# Patient Record
Sex: Female | Born: 1940
Health system: Southern US, Community
[De-identification: ages and names within clinical notes are randomized; demographics above are authoritative.]

## PROBLEM LIST (undated history)

## (undated) DIAGNOSIS — N289 Disorder of kidney and ureter, unspecified: Secondary | ICD-10-CM

## (undated) DIAGNOSIS — J45909 Unspecified asthma, uncomplicated: Secondary | ICD-10-CM

## (undated) DIAGNOSIS — I2510 Atherosclerotic heart disease of native coronary artery without angina pectoris: Secondary | ICD-10-CM

## (undated) DIAGNOSIS — E119 Type 2 diabetes mellitus without complications: Secondary | ICD-10-CM

## (undated) DIAGNOSIS — E782 Mixed hyperlipidemia: Secondary | ICD-10-CM

## (undated) DIAGNOSIS — G4700 Insomnia, unspecified: Secondary | ICD-10-CM

## (undated) DIAGNOSIS — R413 Other amnesia: Secondary | ICD-10-CM

## (undated) DIAGNOSIS — I4430 Unspecified atrioventricular block: Secondary | ICD-10-CM

## (undated) DIAGNOSIS — T8859XA Other complications of anesthesia, initial encounter: Secondary | ICD-10-CM

## (undated) DIAGNOSIS — Z800 Family history of malignant neoplasm of digestive organs: Secondary | ICD-10-CM

## (undated) DIAGNOSIS — T466X5A Adverse effect of antihyperlipidemic and antiarteriosclerotic drugs, initial encounter: Secondary | ICD-10-CM

## (undated) DIAGNOSIS — M109 Gout, unspecified: Secondary | ICD-10-CM

## (undated) DIAGNOSIS — K219 Gastro-esophageal reflux disease without esophagitis: Secondary | ICD-10-CM

## (undated) DIAGNOSIS — D641 Secondary sideroblastic anemia due to disease: Secondary | ICD-10-CM

## (undated) DIAGNOSIS — R011 Cardiac murmur, unspecified: Secondary | ICD-10-CM

## (undated) DIAGNOSIS — E559 Vitamin D deficiency, unspecified: Secondary | ICD-10-CM

## (undated) DIAGNOSIS — Z860100 Personal history of colon polyps, unspecified: Secondary | ICD-10-CM

## (undated) DIAGNOSIS — G720 Drug-induced myopathy: Secondary | ICD-10-CM

## (undated) DIAGNOSIS — F329 Major depressive disorder, single episode, unspecified: Secondary | ICD-10-CM

## (undated) DIAGNOSIS — E079 Disorder of thyroid, unspecified: Secondary | ICD-10-CM

## (undated) DIAGNOSIS — I10 Essential (primary) hypertension: Secondary | ICD-10-CM

## (undated) DIAGNOSIS — N200 Calculus of kidney: Secondary | ICD-10-CM

## (undated) DIAGNOSIS — F32A Depression, unspecified: Secondary | ICD-10-CM

## (undated) DIAGNOSIS — D689 Coagulation defect, unspecified: Secondary | ICD-10-CM

## (undated) DIAGNOSIS — I509 Heart failure, unspecified: Secondary | ICD-10-CM

## (undated) DIAGNOSIS — F419 Anxiety disorder, unspecified: Secondary | ICD-10-CM

## (undated) DIAGNOSIS — G43909 Migraine, unspecified, not intractable, without status migrainosus: Secondary | ICD-10-CM

## (undated) DIAGNOSIS — J449 Chronic obstructive pulmonary disease, unspecified: Secondary | ICD-10-CM

## (undated) DIAGNOSIS — F330 Major depressive disorder, recurrent, mild: Secondary | ICD-10-CM

## (undated) DIAGNOSIS — D649 Anemia, unspecified: Secondary | ICD-10-CM

## (undated) DIAGNOSIS — M797 Fibromyalgia: Secondary | ICD-10-CM

## (undated) DIAGNOSIS — N189 Chronic kidney disease, unspecified: Secondary | ICD-10-CM

## (undated) DIAGNOSIS — I519 Heart disease, unspecified: Secondary | ICD-10-CM

## (undated) DIAGNOSIS — M1990 Unspecified osteoarthritis, unspecified site: Secondary | ICD-10-CM

## (undated) DIAGNOSIS — G4730 Sleep apnea, unspecified: Secondary | ICD-10-CM

## (undated) DIAGNOSIS — L719 Rosacea, unspecified: Secondary | ICD-10-CM

## (undated) HISTORY — DX: Insomnia, unspecified: G47.00

## (undated) HISTORY — DX: Major depressive disorder, recurrent, mild: F33.0

## (undated) HISTORY — DX: Gastro-esophageal reflux disease without esophagitis: K21.9

## (undated) HISTORY — DX: Gout, unspecified: M10.9

## (undated) HISTORY — DX: Sleep apnea, unspecified: G47.30

## (undated) HISTORY — DX: Migraine, unspecified, not intractable, without status migrainosus: G43.909

## (undated) HISTORY — DX: Secondary sideroblastic anemia due to disease: D64.1

## (undated) HISTORY — DX: Anxiety disorder, unspecified: F41.9

## (undated) HISTORY — DX: Heart disease, unspecified: I51.9

## (undated) HISTORY — DX: Fibromyalgia: M79.7

## (undated) HISTORY — DX: Personal history of colonic polyps: Z86.010

## (undated) HISTORY — DX: Atherosclerotic heart disease of native coronary artery without angina pectoris: I25.10

## (undated) HISTORY — DX: Vitamin D deficiency, unspecified: E55.9

## (undated) HISTORY — DX: Major depressive disorder, single episode, unspecified: F32.9

## (undated) HISTORY — DX: Anemia, unspecified: D64.9

## (undated) HISTORY — DX: Calculus of kidney: N20.0

## (undated) HISTORY — DX: Personal history of colon polyps, unspecified: Z86.0100

## (undated) HISTORY — DX: Drug-induced myopathy: G72.0

## (undated) HISTORY — DX: Mixed hyperlipidemia: E78.2

## (undated) HISTORY — DX: Disorder of kidney and ureter, unspecified: N28.9

## (undated) HISTORY — DX: Other amnesia: R41.3

## (undated) HISTORY — DX: Chronic obstructive pulmonary disease, unspecified: J44.9

## (undated) HISTORY — DX: Unspecified osteoarthritis, unspecified site: M19.90

## (undated) HISTORY — DX: Unspecified asthma, uncomplicated: J45.909

## (undated) HISTORY — DX: Family history of malignant neoplasm of digestive organs: Z80.0

## (undated) HISTORY — DX: Heart failure, unspecified: I50.9

## (undated) HISTORY — DX: Depression, unspecified: F32.A

## (undated) HISTORY — DX: Chronic kidney disease, unspecified: N18.9

## (undated) HISTORY — DX: Rosacea, unspecified: L71.9

## (undated) HISTORY — DX: Coagulation defect, unspecified: D68.9

## (undated) HISTORY — DX: Cardiac murmur, unspecified: R01.1

## (undated) HISTORY — DX: Unspecified atrioventricular block: I44.30

## (undated) HISTORY — DX: Disorder of thyroid, unspecified: E07.9

## (undated) HISTORY — DX: Essential (primary) hypertension: I10

## (undated) HISTORY — DX: Drug-induced myopathy: T46.6X5A

## (undated) HISTORY — DX: Type 2 diabetes mellitus without complications: E11.9

---

## 1983-01-12 DIAGNOSIS — N200 Calculus of kidney: Secondary | ICD-10-CM

## 1983-01-12 HISTORY — DX: Calculus of kidney: N20.0

## 1988-12-31 HISTORY — PX: APPENDECTOMY: SHX54

## 1996-05-22 HISTORY — PX: BREAST LUMPECTOMY: SHX2

## 1998-02-18 ENCOUNTER — Other Ambulatory Visit: Admission: RE | Admit: 1998-02-18 | Discharge: 1998-02-18 | Payer: Self-pay | Admitting: Gynecology

## 1998-02-22 ENCOUNTER — Inpatient Hospital Stay (HOSPITAL_COMMUNITY): Admission: RE | Admit: 1998-02-22 | Discharge: 1998-02-25 | Payer: Self-pay | Admitting: Gynecology

## 1998-03-05 HISTORY — PX: ABDOMINAL HYSTERECTOMY: SHX81

## 1999-05-09 ENCOUNTER — Other Ambulatory Visit: Admission: RE | Admit: 1999-05-09 | Discharge: 1999-05-09 | Payer: Self-pay | Admitting: Gynecology

## 2000-07-24 ENCOUNTER — Other Ambulatory Visit: Admission: RE | Admit: 2000-07-24 | Discharge: 2000-07-24 | Payer: Self-pay | Admitting: Gynecology

## 2001-09-03 ENCOUNTER — Other Ambulatory Visit: Admission: RE | Admit: 2001-09-03 | Discharge: 2001-09-03 | Payer: Self-pay | Admitting: Gynecology

## 2002-05-07 ENCOUNTER — Ambulatory Visit (HOSPITAL_COMMUNITY): Admission: RE | Admit: 2002-05-07 | Discharge: 2002-05-07 | Payer: Self-pay | Admitting: Gastroenterology

## 2002-10-28 ENCOUNTER — Other Ambulatory Visit: Admission: RE | Admit: 2002-10-28 | Discharge: 2002-10-28 | Payer: Self-pay | Admitting: Gynecology

## 2004-02-23 ENCOUNTER — Other Ambulatory Visit: Admission: RE | Admit: 2004-02-23 | Discharge: 2004-02-23 | Payer: Self-pay | Admitting: Gynecology

## 2005-04-07 ENCOUNTER — Other Ambulatory Visit: Admission: RE | Admit: 2005-04-07 | Discharge: 2005-04-07 | Payer: Self-pay | Admitting: Gynecology

## 2005-11-16 HISTORY — PX: ESOPHAGOGASTRODUODENOSCOPY: SHX1529

## 2011-09-12 DIAGNOSIS — D631 Anemia in chronic kidney disease: Secondary | ICD-10-CM | POA: Diagnosis not present

## 2011-09-12 DIAGNOSIS — N039 Chronic nephritic syndrome with unspecified morphologic changes: Secondary | ICD-10-CM | POA: Diagnosis not present

## 2011-09-12 DIAGNOSIS — N289 Disorder of kidney and ureter, unspecified: Secondary | ICD-10-CM | POA: Diagnosis not present

## 2011-09-26 DIAGNOSIS — N189 Chronic kidney disease, unspecified: Secondary | ICD-10-CM | POA: Diagnosis not present

## 2011-09-26 DIAGNOSIS — D631 Anemia in chronic kidney disease: Secondary | ICD-10-CM | POA: Diagnosis not present

## 2011-09-28 DIAGNOSIS — Z79899 Other long term (current) drug therapy: Secondary | ICD-10-CM | POA: Diagnosis not present

## 2011-10-10 DIAGNOSIS — N189 Chronic kidney disease, unspecified: Secondary | ICD-10-CM | POA: Diagnosis not present

## 2011-10-10 DIAGNOSIS — D631 Anemia in chronic kidney disease: Secondary | ICD-10-CM | POA: Diagnosis not present

## 2011-11-14 DIAGNOSIS — E7800 Pure hypercholesterolemia, unspecified: Secondary | ICD-10-CM | POA: Diagnosis not present

## 2011-11-14 DIAGNOSIS — Z79899 Other long term (current) drug therapy: Secondary | ICD-10-CM | POA: Diagnosis not present

## 2011-11-14 DIAGNOSIS — E119 Type 2 diabetes mellitus without complications: Secondary | ICD-10-CM | POA: Diagnosis not present

## 2011-11-15 DIAGNOSIS — E7800 Pure hypercholesterolemia, unspecified: Secondary | ICD-10-CM | POA: Diagnosis not present

## 2011-11-15 DIAGNOSIS — E119 Type 2 diabetes mellitus without complications: Secondary | ICD-10-CM | POA: Diagnosis not present

## 2011-11-15 DIAGNOSIS — D649 Anemia, unspecified: Secondary | ICD-10-CM | POA: Diagnosis not present

## 2011-11-15 DIAGNOSIS — I10 Essential (primary) hypertension: Secondary | ICD-10-CM | POA: Diagnosis not present

## 2011-11-15 DIAGNOSIS — F329 Major depressive disorder, single episode, unspecified: Secondary | ICD-10-CM | POA: Diagnosis not present

## 2011-12-04 DIAGNOSIS — E041 Nontoxic single thyroid nodule: Secondary | ICD-10-CM | POA: Diagnosis not present

## 2011-12-04 DIAGNOSIS — E119 Type 2 diabetes mellitus without complications: Secondary | ICD-10-CM | POA: Diagnosis not present

## 2011-12-04 DIAGNOSIS — I10 Essential (primary) hypertension: Secondary | ICD-10-CM | POA: Diagnosis not present

## 2011-12-04 DIAGNOSIS — E7800 Pure hypercholesterolemia, unspecified: Secondary | ICD-10-CM | POA: Diagnosis not present

## 2011-12-26 DIAGNOSIS — D631 Anemia in chronic kidney disease: Secondary | ICD-10-CM | POA: Diagnosis not present

## 2011-12-26 DIAGNOSIS — N189 Chronic kidney disease, unspecified: Secondary | ICD-10-CM | POA: Diagnosis not present

## 2011-12-26 DIAGNOSIS — N039 Chronic nephritic syndrome with unspecified morphologic changes: Secondary | ICD-10-CM | POA: Diagnosis not present

## 2012-02-06 DIAGNOSIS — D631 Anemia in chronic kidney disease: Secondary | ICD-10-CM | POA: Diagnosis not present

## 2012-02-06 DIAGNOSIS — N189 Chronic kidney disease, unspecified: Secondary | ICD-10-CM | POA: Diagnosis not present

## 2012-02-12 DIAGNOSIS — E7800 Pure hypercholesterolemia, unspecified: Secondary | ICD-10-CM | POA: Diagnosis not present

## 2012-02-12 DIAGNOSIS — I10 Essential (primary) hypertension: Secondary | ICD-10-CM | POA: Diagnosis not present

## 2012-02-12 DIAGNOSIS — E119 Type 2 diabetes mellitus without complications: Secondary | ICD-10-CM | POA: Diagnosis not present

## 2012-02-12 DIAGNOSIS — Z79899 Other long term (current) drug therapy: Secondary | ICD-10-CM | POA: Diagnosis not present

## 2012-02-12 DIAGNOSIS — D649 Anemia, unspecified: Secondary | ICD-10-CM | POA: Diagnosis not present

## 2012-02-12 DIAGNOSIS — N189 Chronic kidney disease, unspecified: Secondary | ICD-10-CM | POA: Diagnosis not present

## 2012-02-20 DIAGNOSIS — N189 Chronic kidney disease, unspecified: Secondary | ICD-10-CM | POA: Diagnosis not present

## 2012-02-20 DIAGNOSIS — D631 Anemia in chronic kidney disease: Secondary | ICD-10-CM | POA: Diagnosis not present

## 2012-03-05 DIAGNOSIS — N189 Chronic kidney disease, unspecified: Secondary | ICD-10-CM | POA: Diagnosis not present

## 2012-03-05 DIAGNOSIS — D631 Anemia in chronic kidney disease: Secondary | ICD-10-CM | POA: Diagnosis not present

## 2012-03-19 DIAGNOSIS — N039 Chronic nephritic syndrome with unspecified morphologic changes: Secondary | ICD-10-CM | POA: Diagnosis not present

## 2012-03-19 DIAGNOSIS — N189 Chronic kidney disease, unspecified: Secondary | ICD-10-CM | POA: Diagnosis not present

## 2012-03-19 DIAGNOSIS — D631 Anemia in chronic kidney disease: Secondary | ICD-10-CM | POA: Diagnosis not present

## 2012-04-08 DIAGNOSIS — R233 Spontaneous ecchymoses: Secondary | ICD-10-CM | POA: Diagnosis not present

## 2012-04-08 DIAGNOSIS — Z79899 Other long term (current) drug therapy: Secondary | ICD-10-CM | POA: Diagnosis not present

## 2012-04-08 DIAGNOSIS — G459 Transient cerebral ischemic attack, unspecified: Secondary | ICD-10-CM | POA: Diagnosis not present

## 2012-04-08 DIAGNOSIS — R29810 Facial weakness: Secondary | ICD-10-CM | POA: Diagnosis not present

## 2012-04-08 DIAGNOSIS — I998 Other disorder of circulatory system: Secondary | ICD-10-CM | POA: Diagnosis not present

## 2012-04-08 DIAGNOSIS — G8190 Hemiplegia, unspecified affecting unspecified side: Secondary | ICD-10-CM | POA: Diagnosis not present

## 2012-04-16 DIAGNOSIS — G4710 Hypersomnia, unspecified: Secondary | ICD-10-CM | POA: Diagnosis not present

## 2012-04-22 DIAGNOSIS — I6529 Occlusion and stenosis of unspecified carotid artery: Secondary | ICD-10-CM | POA: Diagnosis not present

## 2012-04-30 DIAGNOSIS — N189 Chronic kidney disease, unspecified: Secondary | ICD-10-CM | POA: Diagnosis not present

## 2012-04-30 DIAGNOSIS — D631 Anemia in chronic kidney disease: Secondary | ICD-10-CM | POA: Diagnosis not present

## 2012-05-28 DIAGNOSIS — E7800 Pure hypercholesterolemia, unspecified: Secondary | ICD-10-CM | POA: Diagnosis not present

## 2012-05-28 DIAGNOSIS — Z79899 Other long term (current) drug therapy: Secondary | ICD-10-CM | POA: Diagnosis not present

## 2012-05-28 DIAGNOSIS — E559 Vitamin D deficiency, unspecified: Secondary | ICD-10-CM | POA: Diagnosis not present

## 2012-05-28 DIAGNOSIS — E038 Other specified hypothyroidism: Secondary | ICD-10-CM | POA: Diagnosis not present

## 2012-05-28 DIAGNOSIS — E119 Type 2 diabetes mellitus without complications: Secondary | ICD-10-CM | POA: Diagnosis not present

## 2012-06-04 DIAGNOSIS — I10 Essential (primary) hypertension: Secondary | ICD-10-CM | POA: Diagnosis not present

## 2012-06-04 DIAGNOSIS — E041 Nontoxic single thyroid nodule: Secondary | ICD-10-CM | POA: Diagnosis not present

## 2012-06-04 DIAGNOSIS — E7800 Pure hypercholesterolemia, unspecified: Secondary | ICD-10-CM | POA: Diagnosis not present

## 2012-06-04 DIAGNOSIS — Z1231 Encounter for screening mammogram for malignant neoplasm of breast: Secondary | ICD-10-CM | POA: Diagnosis not present

## 2012-06-04 DIAGNOSIS — E119 Type 2 diabetes mellitus without complications: Secondary | ICD-10-CM | POA: Diagnosis not present

## 2012-06-11 DIAGNOSIS — N189 Chronic kidney disease, unspecified: Secondary | ICD-10-CM | POA: Diagnosis not present

## 2012-06-11 DIAGNOSIS — D631 Anemia in chronic kidney disease: Secondary | ICD-10-CM | POA: Diagnosis not present

## 2012-06-13 DIAGNOSIS — Z23 Encounter for immunization: Secondary | ICD-10-CM | POA: Diagnosis not present

## 2012-06-27 DIAGNOSIS — I10 Essential (primary) hypertension: Secondary | ICD-10-CM | POA: Diagnosis not present

## 2012-06-27 DIAGNOSIS — G44209 Tension-type headache, unspecified, not intractable: Secondary | ICD-10-CM | POA: Diagnosis not present

## 2012-06-27 DIAGNOSIS — H539 Unspecified visual disturbance: Secondary | ICD-10-CM | POA: Diagnosis not present

## 2012-06-27 DIAGNOSIS — R42 Dizziness and giddiness: Secondary | ICD-10-CM | POA: Diagnosis not present

## 2012-06-27 DIAGNOSIS — Z79899 Other long term (current) drug therapy: Secondary | ICD-10-CM | POA: Diagnosis not present

## 2012-06-27 DIAGNOSIS — R51 Headache: Secondary | ICD-10-CM | POA: Diagnosis not present

## 2012-06-28 DIAGNOSIS — H524 Presbyopia: Secondary | ICD-10-CM | POA: Diagnosis not present

## 2012-06-28 DIAGNOSIS — H538 Other visual disturbances: Secondary | ICD-10-CM | POA: Diagnosis not present

## 2012-07-01 DIAGNOSIS — R930 Abnormal findings on diagnostic imaging of skull and head, not elsewhere classified: Secondary | ICD-10-CM | POA: Diagnosis not present

## 2012-07-01 DIAGNOSIS — H539 Unspecified visual disturbance: Secondary | ICD-10-CM | POA: Diagnosis not present

## 2012-07-01 DIAGNOSIS — I672 Cerebral atherosclerosis: Secondary | ICD-10-CM | POA: Diagnosis not present

## 2012-07-01 DIAGNOSIS — R29810 Facial weakness: Secondary | ICD-10-CM | POA: Diagnosis not present

## 2012-07-01 DIAGNOSIS — R42 Dizziness and giddiness: Secondary | ICD-10-CM | POA: Diagnosis not present

## 2012-07-02 DIAGNOSIS — N189 Chronic kidney disease, unspecified: Secondary | ICD-10-CM | POA: Diagnosis not present

## 2012-07-02 DIAGNOSIS — D631 Anemia in chronic kidney disease: Secondary | ICD-10-CM | POA: Diagnosis not present

## 2012-07-04 DIAGNOSIS — G459 Transient cerebral ischemic attack, unspecified: Secondary | ICD-10-CM | POA: Diagnosis not present

## 2012-07-19 DIAGNOSIS — I669 Occlusion and stenosis of unspecified cerebral artery: Secondary | ICD-10-CM | POA: Diagnosis not present

## 2012-07-19 DIAGNOSIS — I672 Cerebral atherosclerosis: Secondary | ICD-10-CM | POA: Diagnosis not present

## 2012-07-23 DIAGNOSIS — D649 Anemia, unspecified: Secondary | ICD-10-CM | POA: Diagnosis not present

## 2012-07-25 DIAGNOSIS — G4730 Sleep apnea, unspecified: Secondary | ICD-10-CM | POA: Diagnosis not present

## 2012-07-25 DIAGNOSIS — G4710 Hypersomnia, unspecified: Secondary | ICD-10-CM | POA: Diagnosis not present

## 2012-07-26 DIAGNOSIS — R42 Dizziness and giddiness: Secondary | ICD-10-CM | POA: Diagnosis not present

## 2012-07-26 DIAGNOSIS — G458 Other transient cerebral ischemic attacks and related syndromes: Secondary | ICD-10-CM | POA: Diagnosis not present

## 2012-07-26 DIAGNOSIS — F329 Major depressive disorder, single episode, unspecified: Secondary | ICD-10-CM | POA: Diagnosis not present

## 2012-07-29 DIAGNOSIS — I671 Cerebral aneurysm, nonruptured: Secondary | ICD-10-CM | POA: Diagnosis not present

## 2012-07-29 DIAGNOSIS — I6609 Occlusion and stenosis of unspecified middle cerebral artery: Secondary | ICD-10-CM | POA: Insufficient documentation

## 2012-07-29 DIAGNOSIS — Q079 Congenital malformation of nervous system, unspecified: Secondary | ICD-10-CM | POA: Diagnosis not present

## 2012-07-29 DIAGNOSIS — R42 Dizziness and giddiness: Secondary | ICD-10-CM | POA: Diagnosis not present

## 2012-08-15 DIAGNOSIS — I119 Hypertensive heart disease without heart failure: Secondary | ICD-10-CM | POA: Diagnosis not present

## 2012-08-15 DIAGNOSIS — I447 Left bundle-branch block, unspecified: Secondary | ICD-10-CM | POA: Diagnosis not present

## 2012-08-15 DIAGNOSIS — E785 Hyperlipidemia, unspecified: Secondary | ICD-10-CM | POA: Diagnosis not present

## 2012-08-15 DIAGNOSIS — I359 Nonrheumatic aortic valve disorder, unspecified: Secondary | ICD-10-CM | POA: Diagnosis not present

## 2012-08-15 DIAGNOSIS — I059 Rheumatic mitral valve disease, unspecified: Secondary | ICD-10-CM | POA: Diagnosis not present

## 2012-08-15 DIAGNOSIS — E119 Type 2 diabetes mellitus without complications: Secondary | ICD-10-CM | POA: Diagnosis not present

## 2012-08-30 DIAGNOSIS — R42 Dizziness and giddiness: Secondary | ICD-10-CM | POA: Diagnosis not present

## 2012-08-30 DIAGNOSIS — G458 Other transient cerebral ischemic attacks and related syndromes: Secondary | ICD-10-CM | POA: Diagnosis not present

## 2012-08-30 DIAGNOSIS — F329 Major depressive disorder, single episode, unspecified: Secondary | ICD-10-CM | POA: Diagnosis not present

## 2012-09-03 DIAGNOSIS — D631 Anemia in chronic kidney disease: Secondary | ICD-10-CM | POA: Diagnosis not present

## 2012-09-03 DIAGNOSIS — N189 Chronic kidney disease, unspecified: Secondary | ICD-10-CM | POA: Diagnosis not present

## 2012-09-06 DIAGNOSIS — I671 Cerebral aneurysm, nonruptured: Secondary | ICD-10-CM | POA: Diagnosis not present

## 2012-09-24 DIAGNOSIS — D631 Anemia in chronic kidney disease: Secondary | ICD-10-CM | POA: Diagnosis not present

## 2012-09-24 DIAGNOSIS — N189 Chronic kidney disease, unspecified: Secondary | ICD-10-CM | POA: Diagnosis not present

## 2012-10-15 DIAGNOSIS — D649 Anemia, unspecified: Secondary | ICD-10-CM | POA: Diagnosis not present

## 2012-11-19 DIAGNOSIS — R0602 Shortness of breath: Secondary | ICD-10-CM | POA: Diagnosis not present

## 2012-11-19 DIAGNOSIS — J209 Acute bronchitis, unspecified: Secondary | ICD-10-CM | POA: Diagnosis not present

## 2012-11-19 DIAGNOSIS — R05 Cough: Secondary | ICD-10-CM | POA: Diagnosis not present

## 2012-11-19 DIAGNOSIS — R51 Headache: Secondary | ICD-10-CM | POA: Diagnosis not present

## 2012-11-19 DIAGNOSIS — R059 Cough, unspecified: Secondary | ICD-10-CM | POA: Diagnosis not present

## 2012-11-26 DIAGNOSIS — N039 Chronic nephritic syndrome with unspecified morphologic changes: Secondary | ICD-10-CM | POA: Diagnosis not present

## 2012-11-26 DIAGNOSIS — D631 Anemia in chronic kidney disease: Secondary | ICD-10-CM | POA: Diagnosis not present

## 2012-11-26 DIAGNOSIS — N189 Chronic kidney disease, unspecified: Secondary | ICD-10-CM | POA: Diagnosis not present

## 2012-12-04 DIAGNOSIS — E119 Type 2 diabetes mellitus without complications: Secondary | ICD-10-CM | POA: Diagnosis not present

## 2012-12-04 DIAGNOSIS — E7800 Pure hypercholesterolemia, unspecified: Secondary | ICD-10-CM | POA: Diagnosis not present

## 2012-12-04 DIAGNOSIS — E041 Nontoxic single thyroid nodule: Secondary | ICD-10-CM | POA: Diagnosis not present

## 2012-12-04 DIAGNOSIS — I10 Essential (primary) hypertension: Secondary | ICD-10-CM | POA: Diagnosis not present

## 2012-12-17 DIAGNOSIS — N189 Chronic kidney disease, unspecified: Secondary | ICD-10-CM | POA: Diagnosis not present

## 2012-12-17 DIAGNOSIS — D631 Anemia in chronic kidney disease: Secondary | ICD-10-CM | POA: Diagnosis not present

## 2013-01-07 DIAGNOSIS — N189 Chronic kidney disease, unspecified: Secondary | ICD-10-CM | POA: Diagnosis not present

## 2013-01-07 DIAGNOSIS — D631 Anemia in chronic kidney disease: Secondary | ICD-10-CM | POA: Diagnosis not present

## 2013-01-28 DIAGNOSIS — D631 Anemia in chronic kidney disease: Secondary | ICD-10-CM | POA: Diagnosis not present

## 2013-01-28 DIAGNOSIS — N189 Chronic kidney disease, unspecified: Secondary | ICD-10-CM | POA: Diagnosis not present

## 2013-01-30 DIAGNOSIS — R29810 Facial weakness: Secondary | ICD-10-CM | POA: Diagnosis not present

## 2013-01-30 DIAGNOSIS — G4730 Sleep apnea, unspecified: Secondary | ICD-10-CM | POA: Diagnosis not present

## 2013-01-30 DIAGNOSIS — E7800 Pure hypercholesterolemia, unspecified: Secondary | ICD-10-CM | POA: Diagnosis not present

## 2013-03-07 DIAGNOSIS — N189 Chronic kidney disease, unspecified: Secondary | ICD-10-CM | POA: Diagnosis not present

## 2013-03-07 DIAGNOSIS — D631 Anemia in chronic kidney disease: Secondary | ICD-10-CM | POA: Diagnosis not present

## 2013-03-21 DIAGNOSIS — D631 Anemia in chronic kidney disease: Secondary | ICD-10-CM | POA: Diagnosis not present

## 2013-03-21 DIAGNOSIS — N189 Chronic kidney disease, unspecified: Secondary | ICD-10-CM | POA: Diagnosis not present

## 2013-04-04 DIAGNOSIS — D631 Anemia in chronic kidney disease: Secondary | ICD-10-CM | POA: Diagnosis not present

## 2013-04-04 DIAGNOSIS — N189 Chronic kidney disease, unspecified: Secondary | ICD-10-CM | POA: Diagnosis not present

## 2013-04-08 DIAGNOSIS — Z23 Encounter for immunization: Secondary | ICD-10-CM | POA: Diagnosis not present

## 2013-04-30 DIAGNOSIS — D649 Anemia, unspecified: Secondary | ICD-10-CM | POA: Diagnosis not present

## 2013-05-02 DIAGNOSIS — D631 Anemia in chronic kidney disease: Secondary | ICD-10-CM | POA: Diagnosis not present

## 2013-05-02 DIAGNOSIS — N189 Chronic kidney disease, unspecified: Secondary | ICD-10-CM | POA: Diagnosis not present

## 2013-05-07 DIAGNOSIS — D509 Iron deficiency anemia, unspecified: Secondary | ICD-10-CM | POA: Diagnosis not present

## 2013-05-08 DIAGNOSIS — E119 Type 2 diabetes mellitus without complications: Secondary | ICD-10-CM | POA: Diagnosis not present

## 2013-05-08 DIAGNOSIS — R51 Headache: Secondary | ICD-10-CM | POA: Diagnosis not present

## 2013-05-08 DIAGNOSIS — E7800 Pure hypercholesterolemia, unspecified: Secondary | ICD-10-CM | POA: Diagnosis not present

## 2013-05-08 DIAGNOSIS — J309 Allergic rhinitis, unspecified: Secondary | ICD-10-CM | POA: Diagnosis not present

## 2013-05-08 DIAGNOSIS — D649 Anemia, unspecified: Secondary | ICD-10-CM | POA: Diagnosis not present

## 2013-05-08 DIAGNOSIS — I10 Essential (primary) hypertension: Secondary | ICD-10-CM | POA: Diagnosis not present

## 2013-05-08 DIAGNOSIS — N189 Chronic kidney disease, unspecified: Secondary | ICD-10-CM | POA: Diagnosis not present

## 2013-05-12 DIAGNOSIS — E7800 Pure hypercholesterolemia, unspecified: Secondary | ICD-10-CM | POA: Diagnosis not present

## 2013-05-22 ENCOUNTER — Other Ambulatory Visit: Payer: Self-pay | Admitting: *Deleted

## 2013-06-05 ENCOUNTER — Encounter: Payer: Self-pay | Admitting: Endocrinology

## 2013-06-05 ENCOUNTER — Other Ambulatory Visit: Payer: Self-pay

## 2013-06-05 ENCOUNTER — Ambulatory Visit (INDEPENDENT_AMBULATORY_CARE_PROVIDER_SITE_OTHER): Payer: Medicare Other | Admitting: Endocrinology

## 2013-06-05 ENCOUNTER — Telehealth: Payer: Self-pay | Admitting: Endocrinology

## 2013-06-05 VITALS — BP 138/62 | HR 76 | Temp 98.7°F | Resp 12 | Ht 62.0 in | Wt 124.3 lb

## 2013-06-05 DIAGNOSIS — E119 Type 2 diabetes mellitus without complications: Secondary | ICD-10-CM

## 2013-06-05 DIAGNOSIS — E785 Hyperlipidemia, unspecified: Secondary | ICD-10-CM

## 2013-06-05 DIAGNOSIS — E039 Hypothyroidism, unspecified: Secondary | ICD-10-CM

## 2013-06-05 DIAGNOSIS — L748 Other eccrine sweat disorders: Secondary | ICD-10-CM

## 2013-06-05 DIAGNOSIS — E041 Nontoxic single thyroid nodule: Secondary | ICD-10-CM | POA: Insufficient documentation

## 2013-06-05 DIAGNOSIS — I10 Essential (primary) hypertension: Secondary | ICD-10-CM | POA: Diagnosis not present

## 2013-06-05 DIAGNOSIS — L749 Eccrine sweat disorder, unspecified: Secondary | ICD-10-CM

## 2013-06-05 LAB — LIPID PANEL
Cholesterol: 166 mg/dL (ref 0–200)
HDL: 60.8 mg/dL (ref >39.00)
Total CHOL/HDL Ratio: 3
VLDL: 24.2 mg/dL (ref 0.0–40.0)

## 2013-06-05 LAB — URINALYSIS, ROUTINE W REFLEX MICROSCOPIC
Hgb urine dipstick: NEGATIVE
Leukocytes, UA: NEGATIVE
Nitrite: NEGATIVE
Specific Gravity, Urine: 1.025 (ref 1.000–1.030)
Urobilinogen, UA: 0.2 (ref 0.0–1.0)

## 2013-06-05 LAB — MICROALBUMIN / CREATININE URINE RATIO
Creatinine,U: 98.9 mg/dL
Microalb Creat Ratio: 0.4 mg/g (ref 0.0–30.0)
Microalb, Ur: 0.4 mg/dL (ref 0.0–1.9)

## 2013-06-05 LAB — T4, FREE: Free T4: 0.91 ng/dL (ref 0.60–1.60)

## 2013-06-05 LAB — HEMOGLOBIN A1C: Hgb A1c MFr Bld: 6.2 % (ref 4.6–6.5)

## 2013-06-05 LAB — COMPREHENSIVE METABOLIC PANEL
ALT: 20 U/L (ref 0–35)
AST: 34 U/L (ref 0–37)
Alkaline Phosphatase: 46 U/L (ref 39–117)
Calcium: 9.6 mg/dL (ref 8.4–10.5)
Chloride: 103 mEq/L (ref 96–112)
Creatinine, Ser: 1 mg/dL (ref 0.4–1.2)
Sodium: 140 mEq/L (ref 135–145)
Total Bilirubin: 0.6 mg/dL (ref 0.3–1.2)

## 2013-06-05 NOTE — Progress Notes (Signed)
Patient ID: Bridget [name], female   DOB: 09-16-1940, 72 y.o.   MRN: [id number]  Bridget [name] is an 72 y.o. female.   Reason for Appointment: Diabetes follow-up   History of Present Illness   Diagnosis: Type 2 DIABETES MELITUS, date of diagnosis:  2007     Previous history: Her A1c at diagnosis was 7.9% She has been treated with a combination of metformin ER and Actos for several years Usually her A1c has been upper normal and her last A1c was 5.8 in 4/14  Recent history: She has again done fairly well with her diabetes control However has been checking blood sugars very infrequently and ran out of strips in the last month or so Lifestyle measures are fairly good except she has not been able to exercise recently     Oral hypoglycemic drugs: Actos 30 mg and metformin ER 4 tablets a day        Side effects from medications: None       Monitors blood glucose: Once a day.    Glucometer: One Touch.          Blood Glucose readings from meter download: readings before breakfast: 88-107, checked mostly in the morning  Hypoglycemia frequency:  none        Meals: 3 meals per day.follows a healthy diet Physical activity: exercise:less now           Dietician visit: Most recent: 2007             Wt Readings from Last 3 Encounters:  06/05/13 124 lb 4.8 oz (56.382 kg)       Medication List       This list is accurate as of: 06/05/13  9:50 AM.  Always use your most recent med list.               fenofibrate micronized 134 MG capsule  Commonly known as:  LOFIBRA  Take 134 mg by mouth daily before breakfast.     levothyroxine 100 MCG tablet  Commonly known as:  SYNTHROID, LEVOTHROID  Take 100 mcg by mouth daily before breakfast. Unsure on dose     lisinopril 10 MG tablet  Commonly known as:  PRINIVIL,ZESTRIL  Take 10 mg by mouth daily.     metFORMIN 500 MG 24 hr tablet  Commonly known as:  GLUCOPHAGE-XR  Take 500 mg by mouth daily with breakfast. 3 a day     pioglitazone 15 MG tablet  Commonly known as:  ACTOS  Take 15 mg by mouth daily. Unsure on dose     sertraline 100 MG tablet  Commonly known as:  ZOLOFT  Take 150 mg by mouth daily.        Allergies: No Known Allergies  Past Medical History  Diagnosis Date  . Diabetes mellitus without complication   . Thyroid disease     No past surgical history on file.  No family history on file.  Social History:  reports that she has never smoked. She has never used smokeless tobacco. Her alcohol and drug histories are not on file.  Review of Systems:  Hypertension:  blood pressure is fairly well-controlled with lisinopril 10 mg  Lipids: She has had hypertriglyceridemia, last lipid panel showed normal triglycerides of 129 and LDL 123, previously was on Lipitor also, stopped by PCP    Sweating spells: She has had these occasionally over the last 6 months, mostly in the daytime, not having typical hot flashes   THYROID: She  does get tired but she has had difficulty sleeping at night because of taking care of sick family member. She apparently is on thyroid suppression but not treated for hypothyroidism   Examination:   BP 138/62  Pulse 76  Temp(Src) 98.7 F (37.1 C)  Resp 12  Ht 5\' 2"  (1.575 m)  Wt 124 lb 4.8 oz (56.382 kg)  BMI 22.73 kg/m2  SpO2 96%  Body mass index is 22.73 kg/(m^2).    ASSESSMENT/ PLAN::   Diabetes type 2   Blood glucose control appears fairly good although she has not checked her blood sugars much in the last month She generally watching her diet but has not been able to exercise much recently Advised her to monitor more readings after meals and will send her prescription for strips She will need to restart walking regularly Will check her A1c and adjust her medications accordingly if needed  THYROID nodule: She has had a thyroid nodule which was biopsied several years ago, has been relatively small and previously measuring 2 cm  She has been compliant  with her Synthroid for thyroid suppression and TSH will be checked today Need to determine what dose she is taking Consider stopping the thyroid supplement if her TSH is low  Sweating spells: Likely to be idiopathic/menopausal, discussed that she may consider clonidine patches while reducing the lisinopril, she can discuss with PCP. Will forward information to PCP  HYPERLIPIDEMIA: She is not sure what medication she is taking and since LDL is high on the last visit will recheck it today  HYPERTENSION: Well controlled, also followed by PCP   [facility] 06/05/2013, 9:50 AM   ADDENDUM: Labs are excellent including lipids  Office Visit on 06/05/2013  Component Date Value Range Status  . Microalb, Ur 06/05/2013 0.4  0.0 - 1.9 mg/dL Final  . Creatinine,U 16/05/9603 98.9   Final  . Microalb Creat Ratio 06/05/2013 0.4  0.0 - 30.0 mg/g Final  . Hemoglobin A1C 06/05/2013 6.2  4.6 - 6.5 % Final   Glycemic Control Guidelines for People with Diabetes:Non Diabetic:  <6%Goal of Therapy: <7%Additional Action Suggested:  >8%   . Sodium 06/05/2013 140  135 - 145 mEq/L Final  . Potassium 06/05/2013 4.2  3.5 - 5.1 mEq/L Final  . Chloride 06/05/2013 103  96 - 112 mEq/L Final  . CO2 06/05/2013 27  19 - 32 mEq/L Final  . Glucose, Bld 06/05/2013 91  70 - 99 mg/dL Final  . BUN 54/04/8118 29* 6 - 23 mg/dL Final  . Creatinine, Ser 06/05/2013 1.0  0.4 - 1.2 mg/dL Final  . Total Bilirubin 06/05/2013 0.6  0.3 - 1.2 mg/dL Final  . Alkaline Phosphatase 06/05/2013 46  39 - 117 U/L Final  . AST 06/05/2013 34  0 - 37 U/L Final  . ALT 06/05/2013 20  0 - 35 U/L Final  . Total Protein 06/05/2013 6.6  6.0 - 8.3 g/dL Final  . Albumin 14/78/2956 3.9  3.5 - 5.2 g/dL Final  . Calcium 21/30/8657 9.6  8.4 - 10.5 mg/dL Final  . GFR 84/69/6295 58.90* >60.00 mL/min Final  . Cholesterol 06/05/2013 166  0 - 200 mg/dL Final   ATP III Classification       Desirable:  < 200 mg/dL               Borderline High:  200 - 239 mg/dL           High:  > = 284 mg/dL  . Triglycerides 06/05/2013 121.0  0.0 - 149.0 mg/dL Final   Normal:  <161 mg/dLBorderline High:  150 - 199 mg/dL  . HDL 06/05/2013 60.80  >39.00 mg/dL Final  . VLDL 09/60/4540 24.2  0.0 - 40.0 mg/dL Final  . LDL Cholesterol 06/05/2013 81  0 - 99 mg/dL Final  . Total CHOL/HDL Ratio 06/05/2013 3   Final                  Men          Women1/2 Average Risk     3.4          3.3Average Risk          5.0          4.42X Average Risk          9.6          7.13X Average Risk          15.0          11.0                      . Free T4 06/05/2013 0.91  0.60 - 1.60 ng/dL Final  . TSH 98/06/9146 0.41  0.35 - 5.50 uIU/mL Final  . Color, Urine 06/05/2013 LT. YELLOW  Yellow;Lt. Yellow Final  . APPearance 06/05/2013 CLEAR  Clear Final  . Specific Gravity, Urine 06/05/2013 1.025  1.000-1.030 Final  . pH 06/05/2013 5.5  5.0 - 8.0 Final  . Total Protein, Urine 06/05/2013 NEGATIVE  Negative Final  . Urine Glucose 06/05/2013 NEGATIVE  Negative Final  . Ketones, ur 06/05/2013 NEGATIVE  Negative Final  . Bilirubin Urine 06/05/2013 NEGATIVE  Negative Final  . Hgb urine dipstick 06/05/2013 NEGATIVE  Negative Final  . Urobilinogen, UA 06/05/2013 0.2  0.0 - 1.0 Final  . Leukocytes, UA 06/05/2013 NEGATIVE  Negative Final  . Nitrite 06/05/2013 NEGATIVE  Negative Final  . WBC, UA 06/05/2013 0-2/hpf  0-2/hpf Final  . Squamous Epithelial / LPF 06/05/2013 Rare(0-4/hpf)  Rare(0-4/hpf) Final  . Hyaline Casts, UA 06/05/2013 Presence of  None Final

## 2013-06-05 NOTE — Patient Instructions (Addendum)
Please check blood sugars at least half the time about 2 hours after any meal and as directed on waking up. Please bring blood sugar monitor to each visit  Walk daily  Clonidine patch for sweating ?

## 2013-06-06 ENCOUNTER — Other Ambulatory Visit: Payer: Self-pay | Admitting: *Deleted

## 2013-06-06 DIAGNOSIS — N1832 Chronic kidney disease, stage 3b: Secondary | ICD-10-CM | POA: Insufficient documentation

## 2013-06-06 DIAGNOSIS — I10 Essential (primary) hypertension: Secondary | ICD-10-CM | POA: Insufficient documentation

## 2013-06-06 MED ORDER — GLUCOSE BLOOD VI STRP: ORAL_STRIP | Status: DC

## 2013-06-06 MED ORDER — PIOGLITAZONE HCL 30 MG PO TABS: 30.0000 mg | ORAL_TABLET | Freq: Every day | ORAL | Status: DC

## 2013-06-06 NOTE — Progress Notes (Signed)
Quick Note:  Please let patient know that the A1c result is normal  However need to know what her Synthroid dose is and also need to make sure her medication list and doses are accurate May need to reduce her thyroid dose ______

## 2013-06-06 NOTE — Telephone Encounter (Signed)
Had to resend rx for test strips with diagnosis code.

## 2013-06-06 NOTE — Telephone Encounter (Signed)
Message left on patient's home machine:  To stop the Thyroid supplement since her thyroid level is high normal

## 2013-06-06 NOTE — Telephone Encounter (Signed)
Patient stated that she takes Synthroid 50 mcg 1 qd. Medication list updated.

## 2013-06-09 ENCOUNTER — Other Ambulatory Visit: Payer: Self-pay | Admitting: *Deleted

## 2013-06-17 ENCOUNTER — Other Ambulatory Visit (HOSPITAL_COMMUNITY)
Admission: RE | Admit: 2013-06-17 | Disposition: A | Discharge status: Home / Self Care | Payer: Medicare Other | Source: Ambulatory Visit | Attending: Oncology | Admitting: Oncology

## 2013-06-17 LAB — BONE MARROW EXAM: Bone Marrow Exam: 775

## 2013-06-18 NOTE — Telephone Encounter (Signed)
Patient is aware that she should stop thyroid supplement.

## 2013-06-24 LAB — CHROMOSOME ANALYSIS, BONE MARROW

## 2013-06-26 DIAGNOSIS — J45909 Unspecified asthma, uncomplicated: Secondary | ICD-10-CM | POA: Diagnosis not present

## 2013-06-30 DIAGNOSIS — E119 Type 2 diabetes mellitus without complications: Secondary | ICD-10-CM | POA: Diagnosis not present

## 2013-06-30 DIAGNOSIS — H524 Presbyopia: Secondary | ICD-10-CM | POA: Diagnosis not present

## 2013-07-04 DIAGNOSIS — D649 Anemia, unspecified: Secondary | ICD-10-CM | POA: Diagnosis not present

## 2013-07-07 DIAGNOSIS — D631 Anemia in chronic kidney disease: Secondary | ICD-10-CM | POA: Diagnosis not present

## 2013-07-07 DIAGNOSIS — Z09 Encounter for follow-up examination after completed treatment for conditions other than malignant neoplasm: Secondary | ICD-10-CM | POA: Diagnosis not present

## 2013-07-07 DIAGNOSIS — N189 Chronic kidney disease, unspecified: Secondary | ICD-10-CM | POA: Diagnosis not present

## 2013-08-05 DIAGNOSIS — D649 Anemia, unspecified: Secondary | ICD-10-CM | POA: Diagnosis not present

## 2013-08-11 DIAGNOSIS — Z79899 Other long term (current) drug therapy: Secondary | ICD-10-CM | POA: Diagnosis not present

## 2013-08-11 DIAGNOSIS — J309 Allergic rhinitis, unspecified: Secondary | ICD-10-CM | POA: Diagnosis not present

## 2013-08-11 DIAGNOSIS — M25519 Pain in unspecified shoulder: Secondary | ICD-10-CM | POA: Diagnosis not present

## 2013-08-11 DIAGNOSIS — N189 Chronic kidney disease, unspecified: Secondary | ICD-10-CM | POA: Diagnosis not present

## 2013-08-11 DIAGNOSIS — I10 Essential (primary) hypertension: Secondary | ICD-10-CM | POA: Diagnosis not present

## 2013-08-11 DIAGNOSIS — D649 Anemia, unspecified: Secondary | ICD-10-CM | POA: Diagnosis not present

## 2013-08-11 DIAGNOSIS — E119 Type 2 diabetes mellitus without complications: Secondary | ICD-10-CM | POA: Diagnosis not present

## 2013-08-11 DIAGNOSIS — E7800 Pure hypercholesterolemia, unspecified: Secondary | ICD-10-CM | POA: Diagnosis not present

## 2013-09-01 DIAGNOSIS — M25519 Pain in unspecified shoulder: Secondary | ICD-10-CM | POA: Diagnosis not present

## 2013-09-05 DIAGNOSIS — D649 Anemia, unspecified: Secondary | ICD-10-CM | POA: Diagnosis not present

## 2013-09-29 ENCOUNTER — Other Ambulatory Visit: Payer: Self-pay | Admitting: *Deleted

## 2013-09-29 MED ORDER — GLUCOSE BLOOD VI STRP: ORAL_STRIP | Status: DC

## 2013-09-29 MED ORDER — PIOGLITAZONE HCL 30 MG PO TABS: 30.0000 mg | ORAL_TABLET | Freq: Every day | ORAL | Status: DC

## 2013-09-29 MED ORDER — ONETOUCH DELICA LANCETS 33G MISC: Status: DC

## 2013-10-06 ENCOUNTER — Other Ambulatory Visit: Payer: Self-pay | Admitting: *Deleted

## 2013-10-06 MED ORDER — ONETOUCH DELICA LANCETS 33G MISC: Status: DC

## 2013-10-07 DIAGNOSIS — Z09 Encounter for follow-up examination after completed treatment for conditions other than malignant neoplasm: Secondary | ICD-10-CM | POA: Diagnosis not present

## 2013-10-07 DIAGNOSIS — D631 Anemia in chronic kidney disease: Secondary | ICD-10-CM | POA: Diagnosis not present

## 2013-10-07 DIAGNOSIS — N189 Chronic kidney disease, unspecified: Secondary | ICD-10-CM | POA: Diagnosis not present

## 2013-11-19 DIAGNOSIS — D649 Anemia, unspecified: Secondary | ICD-10-CM | POA: Diagnosis not present

## 2013-11-24 DIAGNOSIS — E119 Type 2 diabetes mellitus without complications: Secondary | ICD-10-CM | POA: Diagnosis not present

## 2013-11-24 DIAGNOSIS — K219 Gastro-esophageal reflux disease without esophagitis: Secondary | ICD-10-CM | POA: Diagnosis not present

## 2013-11-24 DIAGNOSIS — I10 Essential (primary) hypertension: Secondary | ICD-10-CM | POA: Diagnosis not present

## 2013-11-24 DIAGNOSIS — D649 Anemia, unspecified: Secondary | ICD-10-CM | POA: Diagnosis not present

## 2013-11-24 DIAGNOSIS — E7800 Pure hypercholesterolemia, unspecified: Secondary | ICD-10-CM | POA: Diagnosis not present

## 2013-11-24 DIAGNOSIS — R072 Precordial pain: Secondary | ICD-10-CM | POA: Diagnosis not present

## 2013-11-24 DIAGNOSIS — M25519 Pain in unspecified shoulder: Secondary | ICD-10-CM | POA: Diagnosis not present

## 2013-11-24 DIAGNOSIS — N189 Chronic kidney disease, unspecified: Secondary | ICD-10-CM | POA: Diagnosis not present

## 2013-11-26 DIAGNOSIS — I447 Left bundle-branch block, unspecified: Secondary | ICD-10-CM | POA: Diagnosis not present

## 2013-11-26 DIAGNOSIS — E785 Hyperlipidemia, unspecified: Secondary | ICD-10-CM | POA: Diagnosis not present

## 2013-11-26 DIAGNOSIS — G4733 Obstructive sleep apnea (adult) (pediatric): Secondary | ICD-10-CM | POA: Diagnosis not present

## 2013-11-26 DIAGNOSIS — R0602 Shortness of breath: Secondary | ICD-10-CM | POA: Diagnosis not present

## 2013-11-26 DIAGNOSIS — R079 Chest pain, unspecified: Secondary | ICD-10-CM | POA: Diagnosis not present

## 2013-11-26 DIAGNOSIS — J45909 Unspecified asthma, uncomplicated: Secondary | ICD-10-CM | POA: Diagnosis not present

## 2013-11-26 DIAGNOSIS — E119 Type 2 diabetes mellitus without complications: Secondary | ICD-10-CM | POA: Diagnosis not present

## 2013-11-26 DIAGNOSIS — I119 Hypertensive heart disease without heart failure: Secondary | ICD-10-CM | POA: Diagnosis not present

## 2013-12-02 ENCOUNTER — Other Ambulatory Visit: Payer: Medicare Other

## 2013-12-04 DIAGNOSIS — I359 Nonrheumatic aortic valve disorder, unspecified: Secondary | ICD-10-CM | POA: Diagnosis not present

## 2013-12-04 DIAGNOSIS — R079 Chest pain, unspecified: Secondary | ICD-10-CM | POA: Diagnosis not present

## 2013-12-04 DIAGNOSIS — I059 Rheumatic mitral valve disease, unspecified: Secondary | ICD-10-CM | POA: Diagnosis not present

## 2013-12-05 ENCOUNTER — Ambulatory Visit (INDEPENDENT_AMBULATORY_CARE_PROVIDER_SITE_OTHER): Payer: Medicare Other | Admitting: Endocrinology

## 2013-12-05 ENCOUNTER — Other Ambulatory Visit (INDEPENDENT_AMBULATORY_CARE_PROVIDER_SITE_OTHER): Payer: Medicare Other

## 2013-12-05 ENCOUNTER — Other Ambulatory Visit: Payer: Self-pay | Admitting: *Deleted

## 2013-12-05 ENCOUNTER — Encounter: Payer: Self-pay | Admitting: Endocrinology

## 2013-12-05 VITALS — BP 134/66 | HR 76 | Temp 97.7°F | Resp 14 | Ht 62.0 in | Wt 127.8 lb

## 2013-12-05 DIAGNOSIS — E041 Nontoxic single thyroid nodule: Secondary | ICD-10-CM

## 2013-12-05 DIAGNOSIS — I10 Essential (primary) hypertension: Secondary | ICD-10-CM

## 2013-12-05 DIAGNOSIS — E785 Hyperlipidemia, unspecified: Secondary | ICD-10-CM | POA: Diagnosis not present

## 2013-12-05 DIAGNOSIS — E119 Type 2 diabetes mellitus without complications: Secondary | ICD-10-CM

## 2013-12-05 LAB — T4, FREE: Free T4: 0.74 ng/dL (ref 0.60–1.60)

## 2013-12-05 LAB — HEMOGLOBIN A1C: HEMOGLOBIN A1C: 5.4 % (ref 4.6–6.5)

## 2013-12-05 NOTE — Progress Notes (Signed)
Patient ID: Bridget [name], female   DOB: 12/17/1940, 73 y.o.   MRN: [id number]   Reason for Appointment: Diabetes follow-up   History of Present Illness   Diagnosis: Type 2 DIABETES MELITUS, date of diagnosis:  2007     Previous history: Her A1c at diagnosis was 7.9% She has been treated with a combination of metformin ER and Actos for several years Usually her A1c has been upper normal and her last A1c was 5.8 in 4/14  Recent history: She is doing well with her diabetes control with excellent readings at home However has been checking blood sugars mostly in the mornings Has only one high reading after breakfast, possibly from eating cereal Has not checked any readings after supper Continues to be on Actos and metformin without side effects Also has lost a little weight     Oral hypoglycemic drugs: Actos 30 mg and metformin ER 4 tablets a day         Side effects from medications: None       Monitors blood glucose: Once a day.    Glucometer: One Touch.          Blood Glucose readings from meter download:   PREMEAL Breakfast Lunch Dinner Bedtime Overall  Glucose range:  90-111   78   82-111     Mean  99      101    POST-MEAL PC Breakfast PC Lunch PC Dinner  Glucose range:  163     Mean/median:       Hypoglycemia frequency:  none        Meals: 3 meals per day.follows a healthy diet, occasionally eating cereal in the morning Physical activity: exercise:less now           Dietician visit: Most recent: 2007            Wt Readings from Last 3 Encounters:  12/05/13 127 lb 12.8 oz (57.97 kg)  06/05/13 124 lb 4.8 oz (56.382 kg)   Lab Results  Component Value Date   HGBA1C 6.2 06/05/2013   Lab Results  Component Value Date   MICROALBUR 0.4 06/05/2013   LDLCALC 81 06/05/2013   CREATININE 1.0 06/05/2013       Medication List       This list is accurate as of: 12/05/13  9:59 AM.  Always use your most recent med list.               aspirin 325 MG tablet  Take  325 mg by mouth daily.     budesonide 0.25 MG/2ML nebulizer solution  Commonly known as:  PULMICORT  Take 0.25 mg by nebulization 2 (two) times daily.     fenofibrate 160 MG tablet  Take 160 mg by mouth daily.     glucose blood test strip  - Test 3 times daily. Diagnosis code 250.00.  - ONE TOUCH TEST STRIPS     levothyroxine 50 MCG tablet  Commonly known as:  SYNTHROID, LEVOTHROID  Take 50 mcg by mouth daily.     lisinopril-hydrochlorothiazide 20-12.5 MG per tablet  Commonly known as:  PRINZIDE,ZESTORETIC  Take 1 tablet by mouth daily.     Magnesium 250 MG Tabs  Take 250 mg by mouth daily.     metFORMIN 500 MG 24 hr tablet  Commonly known as:  GLUCOPHAGE-XR  Take 500 mg by mouth daily with breakfast. 3 a day     MULTI VITAMIN DAILY PO  Take by mouth daily.  ONETOUCH DELICA LANCETS 33G Misc  Use to check blood sugars 3 times per day dx code 250.00     pioglitazone 30 MG tablet  Commonly known as:  ACTOS  Take 1 tablet (30 mg total) by mouth daily.     sertraline 100 MG tablet  Commonly known as:  ZOLOFT  TAKE 1 1/2 TABLET BY MOUTH DAILY     Vitamin D-3 1000 UNITS Caps  Take 1,000 Units by mouth daily.        Allergies: No Known Allergies  Past Medical History  Diagnosis Date  . Diabetes mellitus without complication   . Thyroid disease     No past surgical history on file.  No family history on file.  Social History:  reports that she has never smoked. She has never used smokeless tobacco. Her alcohol and drug histories are not on file.  Review of Systems:  Hypertension:  blood pressure is fairly well-controlled with Zestoretic, followed by PCP  Lipids: She has had hypertriglyceridemia controlled with fenofibrate: recent lipid panel showed normal triglycerides of 129, HDL 56 and calculated LDL 343. Not on a statin drug    Sweating spells: She does not want any treatment for this now, had been recommended clonidine  THYROID: She was on thyroid  suppression because of her long-standing benign thyroid nodule. Because of low normal TSH her 50 mcg Synthroid was stopped on the last visit in 10/14  Lab Results  Component Value Date   TSH 0.41 06/05/2013   Was on Procrit for anemia, recent hemoglobin 10.5   Examination:   BP 134/66  Pulse 76  Temp(Src) 97.7 F (36.5 C)  Resp 14  Ht 5\' 2"  (1.575 m)  Wt 127 lb 12.8 oz (57.97 kg)  BMI 23.37 kg/m2  SpO2 95%  Body mass index is 23.37 kg/(m^2).   Right-sided thyroid nodule palpable, about 1.5-2 cm, felt medially and better on swallowing No nodule on the left and no lymphadenopathy  ASSESSMENT/ PLAN:   Diabetes type 2   Blood glucose control appears excellent at home although she has not checked her blood sugars after meals She generally watching her diet but has not been able to exercise again However her weight is better Advised her to monitor more readings after meals  She should try and avoid cereal for breakfast She will need to restart walking regularly Will check her A1c and adjust her medications accordingly if needed  THYROID nodule: She has no apparent change in the nodule on exam and her thyroid level will be checked since she has been off her thyroid suppression  HYPERLIPIDEMIA: Will check direct LDL since calculated LDL may be inaccurate in her with history of diabetic dyslipidemia May benefit from statin if this is high  HYPERTENSION: Well controlled, is followed by PCP   Reather Littler 12/05/2013, 9:59 AM

## 2013-12-08 LAB — TSH: TSH: 1.02 u[IU]/mL (ref 0.35–5.50)

## 2013-12-08 LAB — LDL CHOLESTEROL, DIRECT: Direct LDL: 94.2 mg/dL

## 2013-12-08 NOTE — Progress Notes (Signed)
Quick Note:  Please let patient know that thyroid level and cholesterol are good ______

## 2013-12-25 DIAGNOSIS — Z1231 Encounter for screening mammogram for malignant neoplasm of breast: Secondary | ICD-10-CM | POA: Diagnosis not present

## 2013-12-30 DIAGNOSIS — D631 Anemia in chronic kidney disease: Secondary | ICD-10-CM | POA: Diagnosis not present

## 2013-12-30 DIAGNOSIS — N189 Chronic kidney disease, unspecified: Secondary | ICD-10-CM | POA: Diagnosis not present

## 2013-12-30 DIAGNOSIS — Z09 Encounter for follow-up examination after completed treatment for conditions other than malignant neoplasm: Secondary | ICD-10-CM | POA: Diagnosis not present

## 2014-01-06 DIAGNOSIS — L570 Actinic keratosis: Secondary | ICD-10-CM | POA: Diagnosis not present

## 2014-01-06 DIAGNOSIS — L820 Inflamed seborrheic keratosis: Secondary | ICD-10-CM | POA: Diagnosis not present

## 2014-01-09 DIAGNOSIS — E782 Mixed hyperlipidemia: Secondary | ICD-10-CM | POA: Diagnosis not present

## 2014-01-21 ENCOUNTER — Telehealth: Payer: Self-pay | Admitting: *Deleted

## 2014-01-21 ENCOUNTER — Other Ambulatory Visit: Payer: Self-pay | Admitting: *Deleted

## 2014-01-21 MED ORDER — ONETOUCH DELICA LANCETS 33G MISC: Status: DC

## 2014-01-21 NOTE — Telephone Encounter (Signed)
rx sent

## 2014-01-21 NOTE — Telephone Encounter (Signed)
Need RX for one touch lancet extra fine 33 gauge

## 2014-02-08 DIAGNOSIS — S060X0A Concussion without loss of consciousness, initial encounter: Secondary | ICD-10-CM | POA: Diagnosis not present

## 2014-02-08 DIAGNOSIS — M542 Cervicalgia: Secondary | ICD-10-CM | POA: Diagnosis not present

## 2014-02-08 DIAGNOSIS — T148XXA Other injury of unspecified body region, initial encounter: Secondary | ICD-10-CM | POA: Diagnosis not present

## 2014-02-08 DIAGNOSIS — S0003XA Contusion of scalp, initial encounter: Secondary | ICD-10-CM | POA: Diagnosis not present

## 2014-02-08 DIAGNOSIS — W010XXA Fall on same level from slipping, tripping and stumbling without subsequent striking against object, initial encounter: Secondary | ICD-10-CM | POA: Diagnosis not present

## 2014-02-08 DIAGNOSIS — Z7982 Long term (current) use of aspirin: Secondary | ICD-10-CM | POA: Diagnosis not present

## 2014-02-08 DIAGNOSIS — S060XAA Concussion with loss of consciousness status unknown, initial encounter: Secondary | ICD-10-CM | POA: Diagnosis not present

## 2014-02-08 DIAGNOSIS — E041 Nontoxic single thyroid nodule: Secondary | ICD-10-CM | POA: Diagnosis not present

## 2014-02-08 DIAGNOSIS — S060X9A Concussion with loss of consciousness of unspecified duration, initial encounter: Secondary | ICD-10-CM | POA: Diagnosis not present

## 2014-02-08 DIAGNOSIS — S0993XA Unspecified injury of face, initial encounter: Secondary | ICD-10-CM | POA: Diagnosis not present

## 2014-02-08 DIAGNOSIS — S1093XA Contusion of unspecified part of neck, initial encounter: Secondary | ICD-10-CM | POA: Diagnosis not present

## 2014-02-08 DIAGNOSIS — S199XXA Unspecified injury of neck, initial encounter: Secondary | ICD-10-CM | POA: Diagnosis not present

## 2014-02-19 DIAGNOSIS — S0003XA Contusion of scalp, initial encounter: Secondary | ICD-10-CM | POA: Diagnosis not present

## 2014-02-19 DIAGNOSIS — S1093XA Contusion of unspecified part of neck, initial encounter: Secondary | ICD-10-CM | POA: Diagnosis not present

## 2014-02-20 ENCOUNTER — Other Ambulatory Visit: Payer: Self-pay | Admitting: *Deleted

## 2014-02-20 MED ORDER — METFORMIN HCL ER 500 MG PO TB24: ORAL_TABLET | ORAL | Status: DC

## 2014-03-02 DIAGNOSIS — W010XXA Fall on same level from slipping, tripping and stumbling without subsequent striking against object, initial encounter: Secondary | ICD-10-CM | POA: Diagnosis not present

## 2014-03-02 DIAGNOSIS — E119 Type 2 diabetes mellitus without complications: Secondary | ICD-10-CM | POA: Diagnosis not present

## 2014-03-02 DIAGNOSIS — S0003XA Contusion of scalp, initial encounter: Secondary | ICD-10-CM | POA: Diagnosis not present

## 2014-03-02 DIAGNOSIS — I10 Essential (primary) hypertension: Secondary | ICD-10-CM | POA: Diagnosis not present

## 2014-03-02 DIAGNOSIS — K5901 Slow transit constipation: Secondary | ICD-10-CM | POA: Diagnosis not present

## 2014-03-02 DIAGNOSIS — S1093XA Contusion of unspecified part of neck, initial encounter: Secondary | ICD-10-CM | POA: Diagnosis not present

## 2014-03-02 DIAGNOSIS — E7800 Pure hypercholesterolemia, unspecified: Secondary | ICD-10-CM | POA: Diagnosis not present

## 2014-03-02 DIAGNOSIS — R3589 Other polyuria: Secondary | ICD-10-CM | POA: Diagnosis not present

## 2014-03-02 DIAGNOSIS — D649 Anemia, unspecified: Secondary | ICD-10-CM | POA: Diagnosis not present

## 2014-03-02 DIAGNOSIS — N189 Chronic kidney disease, unspecified: Secondary | ICD-10-CM | POA: Diagnosis not present

## 2014-03-02 DIAGNOSIS — H9190 Unspecified hearing loss, unspecified ear: Secondary | ICD-10-CM | POA: Diagnosis not present

## 2014-03-02 DIAGNOSIS — R358 Other polyuria: Secondary | ICD-10-CM | POA: Diagnosis not present

## 2014-03-03 DIAGNOSIS — Z79899 Other long term (current) drug therapy: Secondary | ICD-10-CM | POA: Diagnosis not present

## 2014-03-03 DIAGNOSIS — E7800 Pure hypercholesterolemia, unspecified: Secondary | ICD-10-CM | POA: Diagnosis not present

## 2014-03-05 DIAGNOSIS — N952 Postmenopausal atrophic vaginitis: Secondary | ICD-10-CM | POA: Diagnosis not present

## 2014-03-05 DIAGNOSIS — Z1289 Encounter for screening for malignant neoplasm of other sites: Secondary | ICD-10-CM | POA: Diagnosis not present

## 2014-03-05 DIAGNOSIS — Z0000 Encounter for general adult medical examination without abnormal findings: Secondary | ICD-10-CM | POA: Diagnosis not present

## 2014-03-09 DIAGNOSIS — M6281 Muscle weakness (generalized): Secondary | ICD-10-CM | POA: Diagnosis not present

## 2014-03-13 DIAGNOSIS — M6281 Muscle weakness (generalized): Secondary | ICD-10-CM | POA: Diagnosis not present

## 2014-03-16 DIAGNOSIS — M6281 Muscle weakness (generalized): Secondary | ICD-10-CM | POA: Diagnosis not present

## 2014-03-18 DIAGNOSIS — IMO0001 Reserved for inherently not codable concepts without codable children: Secondary | ICD-10-CM | POA: Diagnosis not present

## 2014-03-20 DIAGNOSIS — M6281 Muscle weakness (generalized): Secondary | ICD-10-CM | POA: Diagnosis not present

## 2014-03-22 LAB — HM DEXA SCAN

## 2014-03-25 DIAGNOSIS — M6281 Muscle weakness (generalized): Secondary | ICD-10-CM | POA: Diagnosis not present

## 2014-03-27 DIAGNOSIS — M6281 Muscle weakness (generalized): Secondary | ICD-10-CM | POA: Diagnosis not present

## 2014-03-30 DIAGNOSIS — M6281 Muscle weakness (generalized): Secondary | ICD-10-CM | POA: Diagnosis not present

## 2014-03-31 DIAGNOSIS — D631 Anemia in chronic kidney disease: Secondary | ICD-10-CM | POA: Diagnosis not present

## 2014-03-31 DIAGNOSIS — N189 Chronic kidney disease, unspecified: Secondary | ICD-10-CM | POA: Diagnosis not present

## 2014-03-31 DIAGNOSIS — N039 Chronic nephritic syndrome with unspecified morphologic changes: Secondary | ICD-10-CM | POA: Diagnosis not present

## 2014-04-01 DIAGNOSIS — Z13820 Encounter for screening for osteoporosis: Secondary | ICD-10-CM | POA: Diagnosis not present

## 2014-04-01 DIAGNOSIS — M899 Disorder of bone, unspecified: Secondary | ICD-10-CM | POA: Diagnosis not present

## 2014-04-03 DIAGNOSIS — M6281 Muscle weakness (generalized): Secondary | ICD-10-CM | POA: Diagnosis not present

## 2014-04-27 DIAGNOSIS — Z23 Encounter for immunization: Secondary | ICD-10-CM | POA: Diagnosis not present

## 2014-05-04 ENCOUNTER — Other Ambulatory Visit: Payer: Self-pay | Admitting: Endocrinology

## 2014-05-26 ENCOUNTER — Other Ambulatory Visit: Payer: Self-pay | Admitting: *Deleted

## 2014-05-26 MED ORDER — GLUCOSE BLOOD VI STRP: ORAL_STRIP | Status: DC

## 2014-05-27 ENCOUNTER — Other Ambulatory Visit: Payer: Self-pay | Admitting: *Deleted

## 2014-05-27 MED ORDER — ONETOUCH DELICA LANCETS 33G MISC: Status: DC

## 2014-06-01 DIAGNOSIS — N182 Chronic kidney disease, stage 2 (mild): Secondary | ICD-10-CM | POA: Diagnosis not present

## 2014-06-01 DIAGNOSIS — D631 Anemia in chronic kidney disease: Secondary | ICD-10-CM | POA: Diagnosis not present

## 2014-06-15 DIAGNOSIS — E782 Mixed hyperlipidemia: Secondary | ICD-10-CM | POA: Diagnosis not present

## 2014-06-15 DIAGNOSIS — Z23 Encounter for immunization: Secondary | ICD-10-CM | POA: Diagnosis not present

## 2014-06-15 DIAGNOSIS — E119 Type 2 diabetes mellitus without complications: Secondary | ICD-10-CM | POA: Diagnosis not present

## 2014-06-15 DIAGNOSIS — R412 Retrograde amnesia: Secondary | ICD-10-CM | POA: Diagnosis not present

## 2014-06-15 DIAGNOSIS — D649 Anemia, unspecified: Secondary | ICD-10-CM | POA: Diagnosis not present

## 2014-06-15 DIAGNOSIS — I10 Essential (primary) hypertension: Secondary | ICD-10-CM | POA: Diagnosis not present

## 2014-06-15 DIAGNOSIS — E034 Atrophy of thyroid (acquired): Secondary | ICD-10-CM | POA: Diagnosis not present

## 2014-06-15 DIAGNOSIS — E1140 Type 2 diabetes mellitus with diabetic neuropathy, unspecified: Secondary | ICD-10-CM | POA: Diagnosis not present

## 2014-06-15 DIAGNOSIS — R51 Headache: Secondary | ICD-10-CM | POA: Diagnosis not present

## 2014-06-15 DIAGNOSIS — E538 Deficiency of other specified B group vitamins: Secondary | ICD-10-CM | POA: Diagnosis not present

## 2014-06-15 DIAGNOSIS — E780 Pure hypercholesterolemia: Secondary | ICD-10-CM | POA: Diagnosis not present

## 2014-06-15 DIAGNOSIS — G441 Vascular headache, not elsewhere classified: Secondary | ICD-10-CM | POA: Diagnosis not present

## 2014-06-15 DIAGNOSIS — R410 Disorientation, unspecified: Secondary | ICD-10-CM | POA: Diagnosis not present

## 2014-06-15 DIAGNOSIS — R413 Other amnesia: Secondary | ICD-10-CM | POA: Diagnosis not present

## 2014-06-15 LAB — HEMOGLOBIN A1C: Hgb A1c MFr Bld: 5.5 % (ref 4.0–6.0)

## 2014-06-26 ENCOUNTER — Other Ambulatory Visit: Payer: Self-pay | Admitting: Endocrinology

## 2014-07-02 DIAGNOSIS — E119 Type 2 diabetes mellitus without complications: Secondary | ICD-10-CM | POA: Diagnosis not present

## 2014-07-07 ENCOUNTER — Other Ambulatory Visit: Payer: Self-pay | Admitting: Endocrinology

## 2014-07-08 ENCOUNTER — Encounter: Payer: Self-pay | Admitting: *Deleted

## 2014-07-13 ENCOUNTER — Other Ambulatory Visit: Payer: Self-pay | Admitting: Endocrinology

## 2014-07-17 ENCOUNTER — Telehealth: Payer: Self-pay | Admitting: Endocrinology

## 2014-07-17 NOTE — Telephone Encounter (Signed)
Per Ov noted dated 12/05/2013-Patient was due for follow up on 06/06/14, not sure why she thinks next year. She needs to make an appointment before refill given

## 2014-07-17 NOTE — Telephone Encounter (Signed)
Patient went to fill her Actos   Patient was advised to call and schedule an appointment  She is not due back till next year    Thank you

## 2014-07-31 DIAGNOSIS — D631 Anemia in chronic kidney disease: Secondary | ICD-10-CM | POA: Diagnosis not present

## 2014-07-31 DIAGNOSIS — N182 Chronic kidney disease, stage 2 (mild): Secondary | ICD-10-CM | POA: Diagnosis not present

## 2014-08-03 DIAGNOSIS — J0100 Acute maxillary sinusitis, unspecified: Secondary | ICD-10-CM | POA: Diagnosis not present

## 2014-08-12 ENCOUNTER — Ambulatory Visit (INDEPENDENT_AMBULATORY_CARE_PROVIDER_SITE_OTHER): Payer: Medicare Other | Admitting: Endocrinology

## 2014-08-12 ENCOUNTER — Encounter: Payer: Self-pay | Admitting: Endocrinology

## 2014-08-12 VITALS — BP 166/67 | HR 73 | Temp 98.5°F | Resp 14 | Ht 62.0 in | Wt 121.8 lb

## 2014-08-12 DIAGNOSIS — E1165 Type 2 diabetes mellitus with hyperglycemia: Secondary | ICD-10-CM | POA: Diagnosis not present

## 2014-08-12 DIAGNOSIS — E041 Nontoxic single thyroid nodule: Secondary | ICD-10-CM

## 2014-08-12 DIAGNOSIS — I10 Essential (primary) hypertension: Secondary | ICD-10-CM

## 2014-08-12 DIAGNOSIS — E785 Hyperlipidemia, unspecified: Secondary | ICD-10-CM

## 2014-08-12 DIAGNOSIS — IMO0002 Reserved for concepts with insufficient information to code with codable children: Secondary | ICD-10-CM

## 2014-08-12 NOTE — Progress Notes (Signed)
Patient ID: Bridget [name], female   DOB: 03/30/1941, 73 y.o.   MRN: [id number]   Reason for Appointment: Diabetes follow-up   History of Present Illness   Diagnosis: Type 2 DIABETES MELITUS, date of diagnosis:  2007     Previous history: Her A1c at diagnosis was 7.9% She has been treated with a combination of metformin ER and Actos for several years Usually her A1c has been upper normal and her last A1c was 5.8 in 4/14  Recent history: She has been seen in follow-up since 5/15 Although her A1c in 11/15 was stable at 5.5 she appears to have high readings now She says she has been out of her Actos for about a month or so since she did not have a refill Sugars are sporadically higher earlier this month but is checking fasting readings only recently Fasting blood sugars are overall higher also compared to the last visit Has not checked any readings after supper again despite reminders Continues to be on metformin without side effects Also has lost weight because of other medical problems     Oral hypoglycemic drugs: Actos 30 mg, none for 4-6weeks  and metformin ER 4 tablets a day         Side effects from medications: None       Monitors blood glucose: Once a day.    Glucometer: One Touch.          Blood Glucose readings from meter download:   PRE-MEAL Breakfast Lunch  6-7 PM  Bedtime Overall  Glucose range:  92-135   77-158   107-171     Average:      113     Hypoglycemia frequency:  none        Meals: 3 meals per day.follows a healthy diet, occasionally eating cereal in the morning Physical activity: exercise:less now           Dietician visit: Most recent: 2007            Wt Readings from Last 3 Encounters:  08/12/14 121 lb 12.8 oz (55.248 kg)  12/05/13 127 lb 12.8 oz (57.97 kg)  06/05/13 124 lb 4.8 oz (56.382 kg)   Lab Results  Component Value Date   HGBA1C 5.5 06/15/2014   HGBA1C 5.4 12/05/2013   HGBA1C 6.2 06/05/2013   Lab Results  Component Value Date   MICROALBUR 0.4 06/05/2013   LDLCALC 81 06/05/2013   CREATININE 1.0 06/05/2013       Medication List       This list is accurate as of: 08/12/14  1:32 PM.  Always use your most recent med list.               aspirin 325 MG tablet  Take 325 mg by mouth daily.     fenofibrate 160 MG tablet  Take 160 mg by mouth daily.     glucose blood test strip  - Test 3 times daily. Diagnosis code E11.9  - ONE TOUCH TEST STRIPS     lisinopril-hydrochlorothiazide 20-12.5 MG per tablet  Commonly known as:  PRINZIDE,ZESTORETIC  Take 1 tablet by mouth daily.     Magnesium 250 MG Tabs  Take 250 mg by mouth daily.     metFORMIN 500 MG 24 hr tablet  Commonly known as:  GLUCOPHAGE-XR  TAKE TWO TABLETS BY MOUTH TWICE DAILY     MULTI VITAMIN DAILY PO  Take by mouth daily.     ONETOUCH DELICA LANCETS 33G Misc  Use  to check blood sugars 3 times per day dx code E11.9     pioglitazone 30 MG tablet  Commonly known as:  ACTOS  TAKE ONE TABLET BY MOUTH ONCE DAILY     sertraline 100 MG tablet  Commonly known as:  ZOLOFT  TAKE 1 1/2 TABLET BY MOUTH DAILY     Vitamin D-3 1000 UNITS Caps  Take 1,000 Units by mouth daily.        Allergies: No Known Allergies  Past Medical History  Diagnosis Date  . Diabetes mellitus without complication   . Thyroid disease     No past surgical history on file.  No family history on file.  Social History:  reports that she has never smoked. She has never used smokeless tobacco. Her alcohol and drug histories are not on file.  Review of Systems:  Nausea and low appetite present recently, has not talked to PCP.  Also had lost weight  Hypertension:  blood pressure is not well-controlled with Zestoretic today, followed by PCP and cardiologist She has not discussed her high blood pressure with PCP but is planning to see cardiologist  Lipids: She has had hypertriglyceridemia controlled with fenofibrate: recent lipid panel showed normal triglycerides  of 148, HDL 48 and LDL 86 With previous LDL 100.  Not on a statin drug    Sweating spells: She has had been recommended clonidine but is still not wanting to try this today even though her blood pressure is higher  THYROID: She was on thyroid suppression because of her long-standing benign thyroid nodule.  She has had 2 biopsies done on this previously Because of low normal TSH her 50 mcg Synthroid was stopped on the visit in 10/14  Lab Results  Component Value Date   TSH 1.02 12/05/2013   TSH 0.41 06/05/2013   Was on Procrit for anemia, recent hemoglobin  13.1   No complaints of numbness or tingling in her feet   Examination:   BP 166/67 mmHg  Pulse 73  Temp(Src) 98.5 F (36.9 C)  Resp 14  Ht 5\' 2"  (1.575 m)  Wt 121 lb 12.8 oz (55.248 kg)  BMI 22.27 kg/m2  SpO2 96%  Body mass index is 22.27 kg/(m^2).   Right-sided thyroid nodule palpable, about 2 cm, soft to firm, felt medially in the right lobe No thyroid nodule on the left and no lymphadenopathy in the neck No pedal edema  ASSESSMENT/ PLAN:   Diabetes type 2   Blood glucose control appears overall fairly good as judged by her A1c last month However she has started having higher readings because of running out of her Actos which she has previously taken long-term without side effects Recently has been losing weight from GI problems which have not been evaluated as yet by PCP  Recommended going back on Actos and at least temporarily decreasing the metformin to 1000 mg a day until her appetite is back to normal  THYROID nodule: She may have had a minimal increase in the nodule on exam with her being off thyroid suppression for about a year now However it is still relatively small and since she previously has had 2 biopsies will not pursue any further ultrasound exams No other thyroid nodules felt  HYPERLIPIDEMIA: Has excellent control with LDL 86  HYPERTENSION: Blood pressure is significantly higher.  Recommended  clonidine but she wants to talk to her cardiologist first   [facility] 08/12/2014, 1:32 PM

## 2014-08-12 NOTE — Patient Instructions (Signed)
Restart Actos and reduce Metformin to 2 daily  Please check blood sugars at least half the time about 2 hours after any meal and 2-3 times per week on waking up. Please bring blood sugar monitor to each visit

## 2014-08-13 ENCOUNTER — Other Ambulatory Visit: Payer: Self-pay | Admitting: Endocrinology

## 2014-09-08 ENCOUNTER — Encounter: Payer: Self-pay | Admitting: Endocrinology

## 2014-09-17 DIAGNOSIS — E782 Mixed hyperlipidemia: Secondary | ICD-10-CM | POA: Diagnosis not present

## 2014-09-17 DIAGNOSIS — E780 Pure hypercholesterolemia: Secondary | ICD-10-CM | POA: Diagnosis not present

## 2014-09-17 DIAGNOSIS — E1140 Type 2 diabetes mellitus with diabetic neuropathy, unspecified: Secondary | ICD-10-CM | POA: Diagnosis not present

## 2014-09-17 DIAGNOSIS — I10 Essential (primary) hypertension: Secondary | ICD-10-CM | POA: Diagnosis not present

## 2014-09-17 DIAGNOSIS — E034 Atrophy of thyroid (acquired): Secondary | ICD-10-CM | POA: Diagnosis not present

## 2014-09-17 DIAGNOSIS — R0602 Shortness of breath: Secondary | ICD-10-CM | POA: Diagnosis not present

## 2014-09-21 DIAGNOSIS — E042 Nontoxic multinodular goiter: Secondary | ICD-10-CM | POA: Diagnosis not present

## 2014-09-21 DIAGNOSIS — E041 Nontoxic single thyroid nodule: Secondary | ICD-10-CM | POA: Diagnosis not present

## 2014-09-21 DIAGNOSIS — E034 Atrophy of thyroid (acquired): Secondary | ICD-10-CM | POA: Diagnosis not present

## 2014-10-01 DIAGNOSIS — N182 Chronic kidney disease, stage 2 (mild): Secondary | ICD-10-CM | POA: Diagnosis not present

## 2014-10-01 DIAGNOSIS — D631 Anemia in chronic kidney disease: Secondary | ICD-10-CM | POA: Diagnosis not present

## 2014-10-12 ENCOUNTER — Other Ambulatory Visit: Payer: Self-pay | Admitting: Endocrinology

## 2014-10-12 ENCOUNTER — Telehealth: Payer: Self-pay | Admitting: Endocrinology

## 2014-10-12 NOTE — Telephone Encounter (Signed)
Patient called and would like her Rx filled   Rx: Metformin  Pharmacy: [facility] [address]  She is out   Thank you

## 2014-11-19 ENCOUNTER — Other Ambulatory Visit: Payer: Self-pay | Admitting: Endocrinology

## 2014-11-30 DIAGNOSIS — N182 Chronic kidney disease, stage 2 (mild): Secondary | ICD-10-CM | POA: Diagnosis not present

## 2014-11-30 DIAGNOSIS — D631 Anemia in chronic kidney disease: Secondary | ICD-10-CM | POA: Diagnosis not present

## 2014-12-07 ENCOUNTER — Ambulatory Visit: Payer: Medicare Other | Admitting: Endocrinology

## 2014-12-09 DIAGNOSIS — F5101 Primary insomnia: Secondary | ICD-10-CM | POA: Diagnosis not present

## 2014-12-09 DIAGNOSIS — R5382 Chronic fatigue, unspecified: Secondary | ICD-10-CM | POA: Diagnosis not present

## 2014-12-09 DIAGNOSIS — G44209 Tension-type headache, unspecified, not intractable: Secondary | ICD-10-CM | POA: Diagnosis not present

## 2014-12-09 DIAGNOSIS — M79675 Pain in left toe(s): Secondary | ICD-10-CM | POA: Diagnosis not present

## 2014-12-09 DIAGNOSIS — I10 Essential (primary) hypertension: Secondary | ICD-10-CM | POA: Diagnosis not present

## 2014-12-24 DIAGNOSIS — R5382 Chronic fatigue, unspecified: Secondary | ICD-10-CM | POA: Diagnosis not present

## 2014-12-24 DIAGNOSIS — R3915 Urgency of urination: Secondary | ICD-10-CM | POA: Diagnosis not present

## 2014-12-24 DIAGNOSIS — I119 Hypertensive heart disease without heart failure: Secondary | ICD-10-CM | POA: Diagnosis not present

## 2014-12-24 DIAGNOSIS — R1011 Right upper quadrant pain: Secondary | ICD-10-CM | POA: Diagnosis not present

## 2014-12-24 DIAGNOSIS — E119 Type 2 diabetes mellitus without complications: Secondary | ICD-10-CM | POA: Diagnosis not present

## 2014-12-24 DIAGNOSIS — I10 Essential (primary) hypertension: Secondary | ICD-10-CM | POA: Diagnosis not present

## 2014-12-24 DIAGNOSIS — E1140 Type 2 diabetes mellitus with diabetic neuropathy, unspecified: Secondary | ICD-10-CM | POA: Diagnosis not present

## 2014-12-24 DIAGNOSIS — M109 Gout, unspecified: Secondary | ICD-10-CM | POA: Diagnosis not present

## 2014-12-24 DIAGNOSIS — F5101 Primary insomnia: Secondary | ICD-10-CM | POA: Diagnosis not present

## 2014-12-24 DIAGNOSIS — E780 Pure hypercholesterolemia: Secondary | ICD-10-CM | POA: Diagnosis not present

## 2015-01-05 DIAGNOSIS — R233 Spontaneous ecchymoses: Secondary | ICD-10-CM | POA: Diagnosis not present

## 2015-01-05 DIAGNOSIS — L570 Actinic keratosis: Secondary | ICD-10-CM | POA: Diagnosis not present

## 2015-01-11 DIAGNOSIS — D631 Anemia in chronic kidney disease: Secondary | ICD-10-CM | POA: Diagnosis not present

## 2015-01-11 DIAGNOSIS — N182 Chronic kidney disease, stage 2 (mild): Secondary | ICD-10-CM | POA: Diagnosis not present

## 2015-01-25 DIAGNOSIS — N3941 Urge incontinence: Secondary | ICD-10-CM | POA: Diagnosis not present

## 2015-01-25 DIAGNOSIS — F5102 Adjustment insomnia: Secondary | ICD-10-CM | POA: Diagnosis not present

## 2015-01-25 DIAGNOSIS — F411 Generalized anxiety disorder: Secondary | ICD-10-CM | POA: Diagnosis not present

## 2015-02-22 DIAGNOSIS — N189 Chronic kidney disease, unspecified: Secondary | ICD-10-CM | POA: Diagnosis not present

## 2015-04-12 DIAGNOSIS — I119 Hypertensive heart disease without heart failure: Secondary | ICD-10-CM | POA: Diagnosis not present

## 2015-04-12 DIAGNOSIS — E1140 Type 2 diabetes mellitus with diabetic neuropathy, unspecified: Secondary | ICD-10-CM | POA: Diagnosis not present

## 2015-04-12 DIAGNOSIS — E780 Pure hypercholesterolemia: Secondary | ICD-10-CM | POA: Diagnosis not present

## 2015-04-12 LAB — HEMOGLOBIN A1C: Hgb A1c MFr Bld: 6.4 % — AB (ref 4.0–6.0)

## 2015-04-13 DIAGNOSIS — W0110XA Fall on same level from slipping, tripping and stumbling with subsequent striking against unspecified object, initial encounter: Secondary | ICD-10-CM | POA: Diagnosis not present

## 2015-04-13 DIAGNOSIS — R51 Headache: Secondary | ICD-10-CM | POA: Diagnosis not present

## 2015-04-13 DIAGNOSIS — R42 Dizziness and giddiness: Secondary | ICD-10-CM | POA: Diagnosis not present

## 2015-04-13 DIAGNOSIS — I10 Essential (primary) hypertension: Secondary | ICD-10-CM | POA: Diagnosis not present

## 2015-04-13 DIAGNOSIS — M25511 Pain in right shoulder: Secondary | ICD-10-CM | POA: Diagnosis not present

## 2015-04-13 DIAGNOSIS — E1140 Type 2 diabetes mellitus with diabetic neuropathy, unspecified: Secondary | ICD-10-CM | POA: Diagnosis not present

## 2015-04-13 DIAGNOSIS — D649 Anemia, unspecified: Secondary | ICD-10-CM | POA: Diagnosis not present

## 2015-04-13 DIAGNOSIS — N182 Chronic kidney disease, stage 2 (mild): Secondary | ICD-10-CM | POA: Diagnosis not present

## 2015-04-13 DIAGNOSIS — D631 Anemia in chronic kidney disease: Secondary | ICD-10-CM | POA: Diagnosis not present

## 2015-04-13 DIAGNOSIS — S098XXA Other specified injuries of head, initial encounter: Secondary | ICD-10-CM | POA: Diagnosis not present

## 2015-04-13 DIAGNOSIS — E780 Pure hypercholesterolemia: Secondary | ICD-10-CM | POA: Diagnosis not present

## 2015-04-13 DIAGNOSIS — S0083XA Contusion of other part of head, initial encounter: Secondary | ICD-10-CM | POA: Diagnosis not present

## 2015-04-13 DIAGNOSIS — M25512 Pain in left shoulder: Secondary | ICD-10-CM | POA: Diagnosis not present

## 2015-04-13 DIAGNOSIS — Z23 Encounter for immunization: Secondary | ICD-10-CM | POA: Diagnosis not present

## 2015-04-13 DIAGNOSIS — W1849XA Other slipping, tripping and stumbling without falling, initial encounter: Secondary | ICD-10-CM | POA: Diagnosis not present

## 2015-04-13 DIAGNOSIS — S0993XA Unspecified injury of face, initial encounter: Secondary | ICD-10-CM | POA: Diagnosis not present

## 2015-04-13 DIAGNOSIS — R0789 Other chest pain: Secondary | ICD-10-CM | POA: Diagnosis not present

## 2015-04-15 ENCOUNTER — Encounter: Payer: Self-pay | Admitting: *Deleted

## 2015-04-26 DIAGNOSIS — R51 Headache: Secondary | ICD-10-CM | POA: Diagnosis not present

## 2015-04-26 DIAGNOSIS — I10 Essential (primary) hypertension: Secondary | ICD-10-CM | POA: Diagnosis not present

## 2015-04-28 DIAGNOSIS — I4891 Unspecified atrial fibrillation: Secondary | ICD-10-CM | POA: Diagnosis not present

## 2015-04-28 DIAGNOSIS — R51 Headache: Secondary | ICD-10-CM | POA: Diagnosis not present

## 2015-04-28 DIAGNOSIS — R112 Nausea with vomiting, unspecified: Secondary | ICD-10-CM | POA: Diagnosis not present

## 2015-04-28 DIAGNOSIS — I10 Essential (primary) hypertension: Secondary | ICD-10-CM | POA: Diagnosis not present

## 2015-04-28 DIAGNOSIS — R4182 Altered mental status, unspecified: Secondary | ICD-10-CM | POA: Diagnosis not present

## 2015-05-07 DIAGNOSIS — M542 Cervicalgia: Secondary | ICD-10-CM | POA: Diagnosis not present

## 2015-05-07 DIAGNOSIS — Z043 Encounter for examination and observation following other accident: Secondary | ICD-10-CM | POA: Diagnosis not present

## 2015-05-07 DIAGNOSIS — F321 Major depressive disorder, single episode, moderate: Secondary | ICD-10-CM | POA: Diagnosis not present

## 2015-05-07 DIAGNOSIS — M25511 Pain in right shoulder: Secondary | ICD-10-CM | POA: Diagnosis not present

## 2015-05-07 DIAGNOSIS — I6523 Occlusion and stenosis of bilateral carotid arteries: Secondary | ICD-10-CM | POA: Diagnosis not present

## 2015-05-07 DIAGNOSIS — R51 Headache: Secondary | ICD-10-CM | POA: Diagnosis not present

## 2015-05-07 DIAGNOSIS — I10 Essential (primary) hypertension: Secondary | ICD-10-CM | POA: Diagnosis not present

## 2015-05-07 DIAGNOSIS — M25512 Pain in left shoulder: Secondary | ICD-10-CM | POA: Diagnosis not present

## 2015-05-17 DIAGNOSIS — N189 Chronic kidney disease, unspecified: Secondary | ICD-10-CM | POA: Diagnosis not present

## 2015-05-17 DIAGNOSIS — N182 Chronic kidney disease, stage 2 (mild): Secondary | ICD-10-CM | POA: Diagnosis not present

## 2015-05-17 DIAGNOSIS — D631 Anemia in chronic kidney disease: Secondary | ICD-10-CM | POA: Diagnosis not present

## 2015-05-19 DIAGNOSIS — R51 Headache: Secondary | ICD-10-CM | POA: Diagnosis not present

## 2015-05-19 DIAGNOSIS — R42 Dizziness and giddiness: Secondary | ICD-10-CM | POA: Diagnosis not present

## 2015-05-19 DIAGNOSIS — I6523 Occlusion and stenosis of bilateral carotid arteries: Secondary | ICD-10-CM | POA: Diagnosis not present

## 2015-06-01 ENCOUNTER — Other Ambulatory Visit: Payer: Self-pay | Admitting: *Deleted

## 2015-06-01 ENCOUNTER — Encounter: Payer: Self-pay | Admitting: Endocrinology

## 2015-06-01 ENCOUNTER — Ambulatory Visit (INDEPENDENT_AMBULATORY_CARE_PROVIDER_SITE_OTHER): Payer: Medicare Other | Admitting: Endocrinology

## 2015-06-01 VITALS — BP 136/70 | HR 57 | Temp 98.5°F | Resp 14 | Ht 62.0 in | Wt 112.4 lb

## 2015-06-01 DIAGNOSIS — E1165 Type 2 diabetes mellitus with hyperglycemia: Secondary | ICD-10-CM

## 2015-06-01 DIAGNOSIS — E119 Type 2 diabetes mellitus without complications: Secondary | ICD-10-CM | POA: Diagnosis not present

## 2015-06-01 DIAGNOSIS — E041 Nontoxic single thyroid nodule: Secondary | ICD-10-CM

## 2015-06-01 DIAGNOSIS — IMO0002 Reserved for concepts with insufficient information to code with codable children: Secondary | ICD-10-CM

## 2015-06-01 DIAGNOSIS — E785 Hyperlipidemia, unspecified: Secondary | ICD-10-CM | POA: Diagnosis not present

## 2015-06-01 LAB — URINALYSIS, ROUTINE W REFLEX MICROSCOPIC
Bilirubin Urine: NEGATIVE
Hgb urine dipstick: NEGATIVE
Ketones, ur: NEGATIVE
LEUKOCYTES UA: NEGATIVE
Nitrite: NEGATIVE
PH: 5 (ref 5.0–8.0)
RBC / HPF: NONE SEEN (ref 0–?)
Specific Gravity, Urine: 1.025 (ref 1.000–1.030)
TOTAL PROTEIN, URINE-UPE24: NEGATIVE
URINE GLUCOSE: NEGATIVE
Urobilinogen, UA: 0.2 (ref 0.0–1.0)
WBC, UA: NONE SEEN (ref 0–?)

## 2015-06-01 LAB — LIPID PANEL
CHOL/HDL RATIO: 3
Cholesterol: 174 mg/dL (ref 0–200)
HDL: 52.1 mg/dL (ref >39.00)
LDL CALC: 94 mg/dL (ref 0–99)
NonHDL: 121.69
Triglycerides: 140 mg/dL (ref 0.0–149.0)
VLDL: 28 mg/dL (ref 0.0–40.0)

## 2015-06-01 LAB — COMPREHENSIVE METABOLIC PANEL
ALBUMIN: 3.8 g/dL (ref 3.5–5.2)
ALK PHOS: 57 U/L (ref 39–117)
ALT: 19 U/L (ref 0–35)
AST: 36 U/L (ref 0–37)
BUN: 29 mg/dL — AB (ref 6–23)
CALCIUM: 9.5 mg/dL (ref 8.4–10.5)
CO2: 28 mEq/L (ref 19–32)
CREATININE: 1.18 mg/dL (ref 0.40–1.20)
Chloride: 104 mEq/L (ref 96–112)
GFR: 47.83 mL/min — ABNORMAL LOW (ref >60.00)
Glucose, Bld: 109 mg/dL — ABNORMAL HIGH (ref 70–99)
POTASSIUM: 4.2 meq/L (ref 3.5–5.1)
SODIUM: 140 meq/L (ref 135–145)
TOTAL PROTEIN: 6.4 g/dL (ref 6.0–8.3)
Total Bilirubin: 0.5 mg/dL (ref 0.2–1.2)

## 2015-06-01 LAB — TSH: TSH: 1.33 u[IU]/mL (ref 0.35–4.50)

## 2015-06-01 LAB — MICROALBUMIN / CREATININE URINE RATIO
Creatinine,U: 135.2 mg/dL
MICROALB/CREAT RATIO: 1 mg/g (ref 0.0–30.0)
Microalb, Ur: 1.4 mg/dL (ref 0.0–1.9)

## 2015-06-01 MED ORDER — PIOGLITAZONE HCL 30 MG PO TABS: ORAL_TABLET | ORAL | Status: DC

## 2015-06-01 MED ORDER — GLUCOSE BLOOD VI STRP: ORAL_STRIP | Status: DC

## 2015-06-01 MED ORDER — ONETOUCH DELICA LANCETS 33G MISC: Status: DC

## 2015-06-01 MED ORDER — METFORMIN HCL ER 500 MG PO TB24: ORAL_TABLET | ORAL | Status: DC

## 2015-06-01 NOTE — Progress Notes (Signed)
Quick Note:  Please let patient know that the lab results including thyroid have come in the desired range, please fax to PCP ______

## 2015-06-01 NOTE — Progress Notes (Signed)
Patient ID: Bridget [name], female   DOB: 03/18/1941, 74 y.o.   MRN: [id number]   Reason for Appointment: Diabetes follow-up   History of Present Illness   Diagnosis: Type 2 DIABETES MELITUS, date of diagnosis:  2007     Previous history: Her A1c at diagnosis was 7.9% She has been treated with a combination of metformin ER and Actos for several years Usually her A1c has been upper normal and her last A1c was 5.8 in 4/14  Recent history: She has been seen in follow-up since 12 /15  She had an A1c done by her PCP and this was higher than usual at 6.4 On her last visit she was told to restart Actos but for some reason she has not done this Also her metformin was reduced to 2 tablets daily because of her decreased appetite and nausea and she still is doing this Occasionally will take a third tablet when her blood sugar is higher  She did not bring her monitor for download but is keeping a diary, blood sugars are somewhat higher most of the time especially after meals and usually in the morning about 130 Not clear what time of the day she is checking them  Also has lost more weight because of other medical problems and possibly stress     Oral hypoglycemic drugs:   metformin ER 500 mg, 2-3 tablets a day         Side effects from medications: None       Monitors blood glucose: Once or twice a day.    Glucometer: One Touch.          Blood Glucose readings from home diary, recent range: 111-197     Meals: 3 meals per day.follows a healthy diet, occasionally eating cereal in the morning Physical activity: exercise: None, has had difficulty with falls            Dietician visit: Most recent: 2007            Wt Readings from Last 3 Encounters:  06/01/15 112 lb 6.4 oz (50.984 kg)  08/12/14 121 lb 12.8 oz (55.248 kg)  12/05/13 127 lb 12.8 oz (57.97 kg)   Lab Results  Component Value Date   HGBA1C 6.4* 04/12/2015   HGBA1C 5.5 06/15/2014   HGBA1C 5.4 12/05/2013   Lab  Results  Component Value Date   MICROALBUR 0.4 06/05/2013   LDLCALC 81 06/05/2013   CREATININE 1.0 06/05/2013       Medication List       This list is accurate as of: 06/01/15 10:26 AM.  Always use your most recent med list.               aspirin 325 MG tablet  Take 325 mg by mouth daily.     fenofibrate 160 MG tablet  Take 160 mg by mouth daily.     glucose blood test strip  Test 3 times daily. Diagnosis code E11.9 ONE TOUCH TEST STRIPS     lisinopril-hydrochlorothiazide 20-12.5 MG tablet  Commonly known as:  PRINZIDE,ZESTORETIC  Take 1 tablet by mouth daily.     Magnesium 250 MG Tabs  Take 250 mg by mouth daily.     metFORMIN 500 MG 24 hr tablet  Commonly known as:  GLUCOPHAGE-XR  TAKE TWO TABLETS BY MOUTH TWICE DAILY *PATIENT NEEDS OFFICE VISIT FOR FURTHER REFILLS*     MULTI VITAMIN DAILY PO  Take by mouth daily.     ONETOUCH  DELICA LANCETS 33G Misc  Use to check blood sugars 3 times per day dx code E11.9     pioglitazone 30 MG tablet  Commonly known as:  ACTOS  TAKE ONE TABLET BY MOUTH ONCE DAILY     sertraline 100 MG tablet  Commonly known as:  ZOLOFT  TAKE 1 1/2 TABLET BY MOUTH DAILY     Vitamin D-3 1000 UNITS Caps  Take 1,000 Units by mouth daily.        Allergies: No Known Allergies  Past Medical History  Diagnosis Date  . Diabetes mellitus without complication (HCC)   . Thyroid disease     No past surgical history on file.  No family history on file.  Social History:  reports that she has never smoked. She has never used smokeless tobacco. Her alcohol and drug histories are not on file.  Review of Systems:  Still has low appetite and has lost weight  Hypertension:  blood pressure is well-controlled with Zestoretic today, followed by PCP and cardiologist Recent readings at home: 126-138/61-69, previously had much higher readings which she thinks was from stress  Lipids: She has had hypertriglyceridemia controlled with fenofibrate:  recent lipid panel showed normal triglycerides of 161, LDL 114  Not on a statin drug  THYROID: She was on thyroid suppression because of her long-standing benign thyroid nodule.  She has had 2 biopsies done on this previously Because of low normal TSH her 50 mcg Synthroid was stopped on the visit in 10/14 No recent thyroid levels available, last TSH normal in 4/16  She says that when she had x-rays at emergency room she was told to have a thyroid problem and she was sent for another ultrasound, no reports available  Lab Results  Component Value Date   TSH 1.02 12/05/2013   TSH 0.41 06/05/2013   Has anemia, recent hemoglobin 11.1   Examination:   BP 136/70 mmHg  Pulse 57  Temp(Src) 98.5 F (36.9 C)  Resp 14  Ht  (1.575 m)  Wt 112 lb 6.4 oz (50.984 kg)  BMI 20.55 kg/m2  SpO2 97%  Body mass index is 20.55 kg/(m^2).   Right-sided thyroid nodule palpable, about 2 cm, soft to firm, felt medially in the right lobe with difficulty on swallowing only No thyroid nodule on the left and no lymphadenopathy in the neck  ASSESSMENT/ PLAN:   Diabetes type 2   Blood glucose control appears overall relatively higher with A1c 6.4 with some high readings at home Usually her A1c has been near-normal at about 5.5 Control usually is better when she does a combination of Actos and metformin Higher readings are probably related to her not taking Actos and she has not followed up as directed  Recently has been losing weight possibly from stress Also not able to be as active and has tendency to fall  Recommended going back on Actos and continue the metformin  1000 mg a day   THYROID nodule: This is difficult to palpate and appears to be relatively the same on exam, will request ultrasound report from PCP She does not need to have any further ultrasound exam done as her nodule has been benign  HYPERLIPIDEMIA: Has high LDL and this will need to be rechecked again, may be better with her  weight loss. May need to be on a statin drug instead of fenofibrate, likely to not have high triglycerides with the weight she has lost over the last year  HYPERTENSION: Blood pressure is  fairly good recently  Patient Instructions  Restart Actos  Check blood sugars on waking up .. 2.. times a week Also check blood sugars about 2 hours after a meal and do this after different meals by rotation  Recommended blood sugar levels on waking up is 90-130 and about 2 hours after meal is 140-180 Please bring blood sugar monitor to each visit.    Counseling time on subjects discussed above is over 50% of today's 25 minute visit   Islam Eichinger 06/01/2015, 10:26 AM

## 2015-06-01 NOTE — Patient Instructions (Signed)
Restart Actos  Check blood sugars on waking up .. 2.. times a week Also check blood sugars about 2 hours after a meal and do this after different meals by rotation  Recommended blood sugar levels on waking up is 90-130 and about 2 hours after meal is 140-180 Please bring blood sugar monitor to each visit.

## 2015-06-04 DIAGNOSIS — M542 Cervicalgia: Secondary | ICD-10-CM | POA: Diagnosis not present

## 2015-06-04 DIAGNOSIS — I119 Hypertensive heart disease without heart failure: Secondary | ICD-10-CM | POA: Diagnosis not present

## 2015-06-04 DIAGNOSIS — M25511 Pain in right shoulder: Secondary | ICD-10-CM | POA: Diagnosis not present

## 2015-06-04 DIAGNOSIS — F321 Major depressive disorder, single episode, moderate: Secondary | ICD-10-CM | POA: Diagnosis not present

## 2015-06-04 DIAGNOSIS — M25512 Pain in left shoulder: Secondary | ICD-10-CM | POA: Diagnosis not present

## 2015-06-08 ENCOUNTER — Telehealth: Payer: Self-pay | Admitting: Endocrinology

## 2015-06-09 NOTE — Telephone Encounter (Signed)
Error

## 2015-07-05 DIAGNOSIS — E119 Type 2 diabetes mellitus without complications: Secondary | ICD-10-CM | POA: Diagnosis not present

## 2015-07-05 DIAGNOSIS — H524 Presbyopia: Secondary | ICD-10-CM | POA: Diagnosis not present

## 2015-07-16 DIAGNOSIS — N182 Chronic kidney disease, stage 2 (mild): Secondary | ICD-10-CM | POA: Diagnosis not present

## 2015-07-16 DIAGNOSIS — D631 Anemia in chronic kidney disease: Secondary | ICD-10-CM | POA: Diagnosis not present

## 2015-08-05 DIAGNOSIS — E782 Mixed hyperlipidemia: Secondary | ICD-10-CM | POA: Diagnosis not present

## 2015-08-05 DIAGNOSIS — M25511 Pain in right shoulder: Secondary | ICD-10-CM | POA: Diagnosis not present

## 2015-08-05 DIAGNOSIS — I10 Essential (primary) hypertension: Secondary | ICD-10-CM | POA: Diagnosis not present

## 2015-08-05 DIAGNOSIS — F321 Major depressive disorder, single episode, moderate: Secondary | ICD-10-CM | POA: Diagnosis not present

## 2015-08-24 DIAGNOSIS — R42 Dizziness and giddiness: Secondary | ICD-10-CM | POA: Diagnosis not present

## 2015-08-24 DIAGNOSIS — I119 Hypertensive heart disease without heart failure: Secondary | ICD-10-CM | POA: Diagnosis not present

## 2015-09-01 ENCOUNTER — Encounter: Payer: Self-pay | Admitting: Endocrinology

## 2015-09-01 ENCOUNTER — Ambulatory Visit (INDEPENDENT_AMBULATORY_CARE_PROVIDER_SITE_OTHER): Payer: Medicare Other | Admitting: Endocrinology

## 2015-09-01 VITALS — BP 122/68 | HR 77 | Temp 98.2°F | Resp 14 | Ht 62.0 in | Wt 110.6 lb

## 2015-09-01 DIAGNOSIS — E119 Type 2 diabetes mellitus without complications: Secondary | ICD-10-CM | POA: Diagnosis not present

## 2015-09-01 DIAGNOSIS — E041 Nontoxic single thyroid nodule: Secondary | ICD-10-CM

## 2015-09-01 LAB — POCT GLYCOSYLATED HEMOGLOBIN (HGB A1C): Hemoglobin A1C: 5.6

## 2015-09-01 NOTE — Patient Instructions (Signed)
Take only 1 metformin after dinner  Check blood sugars on waking up 2  times a week Also check blood sugars about 2 hours after a meal and do this after different meals by rotation  Recommended blood sugar levels on waking up is 90-130 and about 2 hours after meal is 130-160  Please bring your blood sugar monitor to each visit, thank you

## 2015-09-01 NOTE — Progress Notes (Signed)
Patient ID: Bridget [name], female   DOB: 02/20/1941, 75 y.o.   MRN: [id number]   Reason for Appointment: Diabetes follow-up   History of Present Illness   Diagnosis: Type 2 DIABETES MELITUS, date of diagnosis:  2007     Previous history: Her A1c at diagnosis was 7.9% She has been treated with a combination of metformin ER and Actos for several years Usually her A1c has been upper normal and her last A1c was 5.8 in 4/14  Recent history: She has been seen in follow-up since 12 /15  She had an A1c done by her PCP in 8/16 and this was higher than usual at 6.4 On her last visit she was told to restart Actos Also her metformin was reduced to 2 tablets daily because of her decreased appetite and nausea and she still is doing this  Her blood sugars at home are now excellent with morning range 98-138 and nonfasting 114-164 with only one high reading Usually not checking after supper She does not do any formal exercise She thinks her diet is fairly good with small portions  Also has lost more weight because of other medical problems and possibly stress     Oral hypoglycemic drugs:   metformin ER 500 mg, 2 tablets a day , Actos 30 mg daily         Side effects from medications: None       Monitors blood glucose: Once  a day.    Glucometer: One Touch.          Blood Glucose readings from home diary, recent medium blood sugar 1:15     Meals: 3 meals per day.follows a healthy diet, occasionally eating cereal in the morning Physical activity: exercise: None, has had difficulty with falls            Dietician visit: Most recent: 2007            Wt Readings from Last 3 Encounters:  09/01/15 110 lb 9.6 oz (50.168 kg)  06/01/15 112 lb 6.4 oz (50.984 kg)  08/12/14 121 lb 12.8 oz (55.248 kg)   Lab Results  Component Value Date   HGBA1C 5.6 09/01/2015   HGBA1C 6.4* 04/12/2015   HGBA1C 5.5 06/15/2014   Lab Results  Component Value Date   MICROALBUR 1.4 06/01/2015   LDLCALC 94 06/01/2015   CREATININE 1.18 06/01/2015       Medication List       This list is accurate as of: 09/01/15  9:39 AM.  Always use your most recent med list.               aspirin 325 MG tablet  Take 325 mg by mouth daily.     fenofibrate 160 MG tablet  Take 160 mg by mouth daily.     glucose blood test strip  Test 3 times daily. Diagnosis code E11.9 ONE TOUCH TEST STRIPS     lisinopril-hydrochlorothiazide 20-12.5 MG tablet  Commonly known as:  PRINZIDE,ZESTORETIC  Take 1 tablet by mouth daily.     Magnesium 250 MG Tabs  Take 250 mg by mouth daily.     metFORMIN 500 MG 24 hr tablet  Commonly known as:  GLUCOPHAGE-XR  TAKE TWO TABLETS BY MOUTH TWICE DAILY     metoprolol succinate 25 MG 24 hr tablet  Commonly known as:  TOPROL-XL  Take 25 mg by mouth daily.     MULTI VITAMIN DAILY PO  Take by mouth daily.  ONETOUCH DELICA LANCETS 33G Misc  Use to check blood sugars 3 times per day dx code E11.9     pioglitazone 30 MG tablet  Commonly known as:  ACTOS  TAKE ONE TABLET BY MOUTH ONCE DAILY     sertraline 100 MG tablet  Commonly known as:  ZOLOFT  Reported on 09/01/2015     venlafaxine 75 MG tablet  Commonly known as:  EFFEXOR  Take 75 mg by mouth 2 (two) times daily.     Vitamin D-3 1000 units Caps  Take 1,000 Units by mouth daily.        Allergies:  Allergies  Allergen Reactions  . Sulfamethoxazole-Trimethoprim Rash    Past Medical History  Diagnosis Date  . Diabetes mellitus without complication (HCC)   . Thyroid disease     No past surgical history on file.  No family history on file.  Social History:  reports that she has never smoked. She has never used smokeless tobacco. Her alcohol and drug histories are not on file.  Review of Systems:  Still has low appetite and has lost 2 pounds  Hypertension:  blood pressure is well-controlled with Zestoretic, followed by PCP and cardiologist  Lipids: She has had  hypertriglyceridemia controlled with fenofibrate: Last lipid panel showed normal triglycerides of 161, LDL 114  Not on a statin drug  THYROID:  She was on thyroid suppression because of her long-standing benign thyroid nodule.  She has had 2 biopsies done on this previously Because of low normal TSH her 50 mcg Synthroid was stopped on the visit in 10/14 TSH normal in 10/16  She says that when she had x-rays at emergency room she was told to have a thyroid problem and she was sent for another ultrasound, no reports available  Lab Results  Component Value Date   TSH 1.33 06/01/2015   TSH 1.02 12/05/2013   TSH 0.41 06/05/2013   Has anemia followed by PCP  Previous history of falls: She is doing better with this   Examination:   BP 122/68 mmHg  Pulse 77  Temp(Src) 98.2 F (36.8 C)  Resp 14  Ht  (1.575 m)  Wt 110 lb 9.6 oz (50.168 kg)  BMI 20.22 kg/m2  SpO2 97%  Body mass index is 20.22 kg/(m^2).   Right-sided thyroid nodule palpable, about 2 cm, soft to firm, felt medially in the right lobe   Diabetic Foot Exam - Simple   Simple Foot Form  Diabetic Foot exam was performed with the following findings:  Yes 09/01/2015  9:06 AM  Visual Inspection  No deformities, no ulcerations, no other skin breakdown bilaterally:  Yes  Sensation Testing  Intact to touch and monofilament testing bilaterally:  Yes  Pulse Check  Posterior Tibialis and Dorsalis pulse intact bilaterally:  Yes  Comments       ASSESSMENT/ PLAN:   Diabetes type 2   Blood glucose control is excellent with A1c back to normal with adding Actos 30 mg daily She tolerates this well without any edema Blood sugars are fairly close to normal at home Not doing as many postprandial readings  However since she still has some tendency to nausea and weight loss will reduce her metformin to 1 tablet only in the evening Follow-up in 6 months  THYROID nodule: With request to report from previous  ultrasound   Patient Instructions  Take only 1 metformin after dinner  Check blood sugars on waking up 2  times a week Also check blood  sugars about 2 hours after a meal and do this after different meals by rotation  Recommended blood sugar levels on waking up is 90-130 and about 2 hours after meal is 130-160  Please bring your blood sugar monitor to each visit, thank you      [facility] 09/01/2015, 9:39 AM

## 2015-09-06 DIAGNOSIS — R42 Dizziness and giddiness: Secondary | ICD-10-CM | POA: Diagnosis not present

## 2015-09-06 DIAGNOSIS — I10 Essential (primary) hypertension: Secondary | ICD-10-CM | POA: Diagnosis not present

## 2015-09-06 DIAGNOSIS — J018 Other acute sinusitis: Secondary | ICD-10-CM | POA: Diagnosis not present

## 2015-09-16 DIAGNOSIS — N189 Chronic kidney disease, unspecified: Secondary | ICD-10-CM | POA: Diagnosis not present

## 2015-09-16 DIAGNOSIS — D631 Anemia in chronic kidney disease: Secondary | ICD-10-CM | POA: Diagnosis not present

## 2015-09-16 DIAGNOSIS — N182 Chronic kidney disease, stage 2 (mild): Secondary | ICD-10-CM | POA: Diagnosis not present

## 2015-09-20 DIAGNOSIS — N182 Chronic kidney disease, stage 2 (mild): Secondary | ICD-10-CM | POA: Diagnosis not present

## 2015-09-20 DIAGNOSIS — D631 Anemia in chronic kidney disease: Secondary | ICD-10-CM | POA: Diagnosis not present

## 2015-10-05 ENCOUNTER — Other Ambulatory Visit: Payer: Self-pay | Admitting: Endocrinology

## 2015-10-05 DIAGNOSIS — E1142 Type 2 diabetes mellitus with diabetic polyneuropathy: Secondary | ICD-10-CM | POA: Diagnosis not present

## 2015-10-05 DIAGNOSIS — I10 Essential (primary) hypertension: Secondary | ICD-10-CM | POA: Diagnosis not present

## 2015-10-05 DIAGNOSIS — E782 Mixed hyperlipidemia: Secondary | ICD-10-CM | POA: Diagnosis not present

## 2015-10-05 DIAGNOSIS — D641 Secondary sideroblastic anemia due to disease: Secondary | ICD-10-CM | POA: Diagnosis not present

## 2015-10-14 DIAGNOSIS — N182 Chronic kidney disease, stage 2 (mild): Secondary | ICD-10-CM | POA: Diagnosis not present

## 2015-10-14 DIAGNOSIS — D631 Anemia in chronic kidney disease: Secondary | ICD-10-CM | POA: Diagnosis not present

## 2015-10-20 DIAGNOSIS — Z1231 Encounter for screening mammogram for malignant neoplasm of breast: Secondary | ICD-10-CM | POA: Diagnosis not present

## 2015-10-20 DIAGNOSIS — Z803 Family history of malignant neoplasm of breast: Secondary | ICD-10-CM | POA: Diagnosis not present

## 2015-11-18 DIAGNOSIS — N182 Chronic kidney disease, stage 2 (mild): Secondary | ICD-10-CM | POA: Diagnosis not present

## 2015-11-18 DIAGNOSIS — D631 Anemia in chronic kidney disease: Secondary | ICD-10-CM | POA: Diagnosis not present

## 2015-12-02 DIAGNOSIS — Z23 Encounter for immunization: Secondary | ICD-10-CM | POA: Diagnosis not present

## 2015-12-14 DIAGNOSIS — D631 Anemia in chronic kidney disease: Secondary | ICD-10-CM | POA: Diagnosis not present

## 2015-12-14 DIAGNOSIS — N182 Chronic kidney disease, stage 2 (mild): Secondary | ICD-10-CM | POA: Diagnosis not present

## 2016-01-06 DIAGNOSIS — L570 Actinic keratosis: Secondary | ICD-10-CM | POA: Diagnosis not present

## 2016-01-06 DIAGNOSIS — L719 Rosacea, unspecified: Secondary | ICD-10-CM | POA: Diagnosis not present

## 2016-01-14 DIAGNOSIS — N182 Chronic kidney disease, stage 2 (mild): Secondary | ICD-10-CM | POA: Diagnosis not present

## 2016-01-14 DIAGNOSIS — D631 Anemia in chronic kidney disease: Secondary | ICD-10-CM | POA: Diagnosis not present

## 2016-01-17 DIAGNOSIS — M25511 Pain in right shoulder: Secondary | ICD-10-CM | POA: Diagnosis not present

## 2016-01-17 DIAGNOSIS — I119 Hypertensive heart disease without heart failure: Secondary | ICD-10-CM | POA: Diagnosis not present

## 2016-01-17 DIAGNOSIS — E1142 Type 2 diabetes mellitus with diabetic polyneuropathy: Secondary | ICD-10-CM | POA: Diagnosis not present

## 2016-01-17 DIAGNOSIS — I10 Essential (primary) hypertension: Secondary | ICD-10-CM | POA: Diagnosis not present

## 2016-01-17 DIAGNOSIS — E782 Mixed hyperlipidemia: Secondary | ICD-10-CM | POA: Diagnosis not present

## 2016-01-17 DIAGNOSIS — D641 Secondary sideroblastic anemia due to disease: Secondary | ICD-10-CM | POA: Diagnosis not present

## 2016-02-14 DIAGNOSIS — D631 Anemia in chronic kidney disease: Secondary | ICD-10-CM | POA: Diagnosis not present

## 2016-02-14 DIAGNOSIS — N182 Chronic kidney disease, stage 2 (mild): Secondary | ICD-10-CM | POA: Diagnosis not present

## 2016-02-21 DIAGNOSIS — Z6823 Body mass index (BMI) 23.0-23.9, adult: Secondary | ICD-10-CM | POA: Diagnosis not present

## 2016-02-21 DIAGNOSIS — M8589 Other specified disorders of bone density and structure, multiple sites: Secondary | ICD-10-CM | POA: Diagnosis not present

## 2016-02-21 DIAGNOSIS — Z1389 Encounter for screening for other disorder: Secondary | ICD-10-CM | POA: Diagnosis not present

## 2016-02-21 DIAGNOSIS — Z0001 Encounter for general adult medical examination with abnormal findings: Secondary | ICD-10-CM | POA: Diagnosis not present

## 2016-02-21 DIAGNOSIS — N3001 Acute cystitis with hematuria: Secondary | ICD-10-CM | POA: Diagnosis not present

## 2016-02-21 DIAGNOSIS — Z1211 Encounter for screening for malignant neoplasm of colon: Secondary | ICD-10-CM | POA: Diagnosis not present

## 2016-02-28 DIAGNOSIS — Z13820 Encounter for screening for osteoporosis: Secondary | ICD-10-CM | POA: Diagnosis not present

## 2016-02-28 DIAGNOSIS — M8589 Other specified disorders of bone density and structure, multiple sites: Secondary | ICD-10-CM | POA: Diagnosis not present

## 2016-02-29 ENCOUNTER — Encounter: Payer: Self-pay | Admitting: Endocrinology

## 2016-02-29 ENCOUNTER — Ambulatory Visit (INDEPENDENT_AMBULATORY_CARE_PROVIDER_SITE_OTHER): Payer: Medicare Other | Admitting: Endocrinology

## 2016-02-29 DIAGNOSIS — E119 Type 2 diabetes mellitus without complications: Secondary | ICD-10-CM

## 2016-02-29 DIAGNOSIS — E041 Nontoxic single thyroid nodule: Secondary | ICD-10-CM | POA: Diagnosis not present

## 2016-02-29 LAB — BASIC METABOLIC PANEL
BUN: 30 mg/dL — AB (ref 6–23)
CHLORIDE: 103 meq/L (ref 96–112)
CO2: 30 meq/L (ref 19–32)
CREATININE: 1.06 mg/dL (ref 0.40–1.20)
Calcium: 9.4 mg/dL (ref 8.4–10.5)
GFR: 54.01 mL/min — ABNORMAL LOW (ref >60.00)
Glucose, Bld: 91 mg/dL (ref 70–99)
Potassium: 4.4 mEq/L (ref 3.5–5.1)
Sodium: 139 mEq/L (ref 135–145)

## 2016-02-29 LAB — T4, FREE: Free T4: 0.91 ng/dL (ref 0.60–1.60)

## 2016-02-29 LAB — TSH: TSH: 0.74 u[IU]/mL (ref 0.35–4.50)

## 2016-02-29 LAB — HEMOGLOBIN A1C: Hgb A1c MFr Bld: 5.6 % (ref 4.6–6.5)

## 2016-02-29 NOTE — Progress Notes (Signed)
Patient ID: Bridget [name], female   DOB: [birth year], 75 y.o.   MRN: [id number]   Reason for Appointment: Diabetes follow-up   History of Present Illness   Diagnosis: Type 2 DIABETES MELITUS, date of diagnosis:  2007     Previous history: Her A1c at diagnosis was 7.9% She has been treated with a combination of metformin ER and Actos for several years Usually her A1c has been upper normal and her last A1c was 5.8 in 4/14  Recent history:   She has been seen in follow-up since 1/17  Actos has been continued because of long history of benefit from this and lack of side effects Also her metformin was reduced to 1 tablets daily because of her decreased appetite and nausea and she is able to eat better now She is checking her blood sugars very rarely Her blood sugars at home are now excellent with morning range 98-138 and nonfasting 114-164 with only one high reading She does not do any formal exercise, does not like to walk outside She thinks her diet is fairly good but her weight has gone up significantly      Oral hypoglycemic drugs:   metformin ER 500 mg, 1 tablet a day , Actos 30 mg daily         Side effects from medications: None       Monitors blood glucose:  less than once a day.    Glucometer: One Touch.          Blood Glucose readings from monitor showed only 3 readings of 90-111     Meals: 3 meals per day.follows a healthy diet, occasionally eating cereal in the morning Physical activity: exercise: None.            Dietician visit: Most recent: 2007            Wt Readings from Last 3 Encounters:  02/29/16 124 lb (56.246 kg)  09/01/15 110 lb 9.6 oz (50.168 kg)  06/01/15 112 lb 6.4 oz (50.984 kg)   Lab Results  Component Value Date   HGBA1C 5.6 02/29/2016   HGBA1C 5.6 09/01/2015   HGBA1C 6.4* 04/12/2015   Lab Results  Component Value Date   MICROALBUR 1.4 06/01/2015   LDLCALC 94 06/01/2015   CREATININE 1.06 02/29/2016       Medication List       This list is accurate as of: 02/29/16  5:13 PM.  Always use your most recent med list.               aspirin 325 MG tablet  Take 325 mg by mouth daily.     fenofibrate 160 MG tablet  Take 160 mg by mouth daily.     glucose blood test strip  Test 3 times daily. Diagnosis code E11.9 ONE TOUCH TEST STRIPS     lisinopril-hydrochlorothiazide 20-12.5 MG tablet  Commonly known as:  PRINZIDE,ZESTORETIC  Take by mouth daily. 1/2 tab daily     Magnesium 250 MG Tabs  Take 250 mg by mouth daily.     metFORMIN 500 MG 24 hr tablet  Commonly known as:  GLUCOPHAGE-XR  TAKE TWO TABLETS BY MOUTH TWICE DAILY     metoprolol succinate 25 MG 24 hr tablet  Commonly known as:  TOPROL-XL  Take 25 mg by mouth daily. 1/2 tab daily     MULTI VITAMIN DAILY PO  Take by mouth daily.     ONETOUCH DELICA LANCETS 33G Misc  Use to  check blood sugars 3 times per day dx code E11.9     pioglitazone 30 MG tablet  Commonly known as:  ACTOS  TAKE ONE TABLET BY MOUTH ONCE DAILY     sertraline 100 MG tablet  Commonly known as:  ZOLOFT  Reported on 09/01/2015     venlafaxine 75 MG tablet  Commonly known as:  EFFEXOR  Take 75 mg by mouth daily.     Vitamin D-3 1000 units Caps  Take 1,000 Units by mouth daily.        Allergies:  Allergies  Allergen Reactions  . Sulfamethoxazole-Trimethoprim Rash    Past Medical History  Diagnosis Date  . Diabetes mellitus without complication (HCC)   . Thyroid disease     No past surgical history on file.  No family history on file.  Social History:  reports that she has never smoked. She has never used smokeless tobacco. Her alcohol and drug histories are not on file.  Review of Systems:    Hypertension:  blood pressure is well-controlled with Zestoretic, followed by PCP and cardiologist  Lipids: She has had hypertriglyceridemia controlled with fenofibrate: Last lipid panel showed normal triglycerides of 161, LDL 114  Not on a statin drug  Lab  Results  Component Value Date   CHOL 174 06/01/2015   HDL 52.10 06/01/2015   LDLCALC 94 06/01/2015   LDLDIRECT 94.2 12/05/2013   TRIG 140.0 06/01/2015   CHOLHDL 3 06/01/2015     THYROID:  She was on thyroid suppression because of her long-standing benign thyroid nodule.  She has had 2 biopsies done on this previously Because of low normal TSH her 50 mcg Synthroid was stopped on the visit in 10/14 TSH normal subsequently  Her ultrasound last showed mostly cystic nodule in the right side about 1.6 cm  Lab Results  Component Value Date   TSH 0.74 02/29/2016   TSH 1.33 06/01/2015   TSH 1.02 12/05/2013   Has anemia followed by PCP  Previous history of falls: She is doing better with this   Examination:   BP 132/74 mmHg  Pulse 65  Ht  (1.575 m)  Wt 124 lb (56.246 kg)  BMI 22.67 kg/m2  SpO2 93%  Body mass index is 22.67 kg/(m^2).   Right-sided thyroid nodule palpable, about 2 cm, soft to firm, felt Superiorly the right lobe   No ankle edema  ASSESSMENT/ PLAN:   Diabetes type 2   Blood glucose control is difficult to assess as A1c not done and her lab reports from PCP are not available Although she has gained weight she has not had any high readings at home Discussed need to start exercise and she can go to the mall to walk She will also tried to moderate on her portions; she thinks her weight has leveled off now  Follow-up in 6 months  THYROID nodule: Mostly cystic, proven benign and clinically stable  Hypertension: Well controlled   There are no Patient Instructions on file for this visit.   Bridget [name] 02/29/2016, 5:13 PM

## 2016-03-02 ENCOUNTER — Encounter: Payer: Self-pay | Admitting: *Deleted

## 2016-03-14 DIAGNOSIS — D631 Anemia in chronic kidney disease: Secondary | ICD-10-CM | POA: Diagnosis not present

## 2016-03-14 DIAGNOSIS — N182 Chronic kidney disease, stage 2 (mild): Secondary | ICD-10-CM | POA: Diagnosis not present

## 2016-03-20 DIAGNOSIS — N189 Chronic kidney disease, unspecified: Secondary | ICD-10-CM | POA: Diagnosis not present

## 2016-03-20 DIAGNOSIS — N182 Chronic kidney disease, stage 2 (mild): Secondary | ICD-10-CM | POA: Diagnosis not present

## 2016-03-20 DIAGNOSIS — D631 Anemia in chronic kidney disease: Secondary | ICD-10-CM | POA: Diagnosis not present

## 2016-03-28 DIAGNOSIS — K5904 Chronic idiopathic constipation: Secondary | ICD-10-CM | POA: Diagnosis not present

## 2016-03-28 DIAGNOSIS — Z86010 Personal history of colonic polyps: Secondary | ICD-10-CM | POA: Diagnosis not present

## 2016-03-28 DIAGNOSIS — D638 Anemia in other chronic diseases classified elsewhere: Secondary | ICD-10-CM | POA: Diagnosis not present

## 2016-03-28 DIAGNOSIS — Z800 Family history of malignant neoplasm of digestive organs: Secondary | ICD-10-CM | POA: Diagnosis not present

## 2016-04-18 ENCOUNTER — Other Ambulatory Visit: Payer: Self-pay | Admitting: Endocrinology

## 2016-04-20 DIAGNOSIS — Z800 Family history of malignant neoplasm of digestive organs: Secondary | ICD-10-CM | POA: Diagnosis not present

## 2016-04-20 DIAGNOSIS — Z86010 Personal history of colonic polyps: Secondary | ICD-10-CM | POA: Diagnosis not present

## 2016-04-20 DIAGNOSIS — K5730 Diverticulosis of large intestine without perforation or abscess without bleeding: Secondary | ICD-10-CM | POA: Diagnosis not present

## 2016-04-20 DIAGNOSIS — Z1211 Encounter for screening for malignant neoplasm of colon: Secondary | ICD-10-CM | POA: Diagnosis not present

## 2016-04-20 DIAGNOSIS — Z09 Encounter for follow-up examination after completed treatment for conditions other than malignant neoplasm: Secondary | ICD-10-CM | POA: Diagnosis not present

## 2016-04-20 HISTORY — PX: COLONOSCOPY: SHX174

## 2016-04-24 DIAGNOSIS — I10 Essential (primary) hypertension: Secondary | ICD-10-CM | POA: Diagnosis not present

## 2016-04-24 DIAGNOSIS — E1142 Type 2 diabetes mellitus with diabetic polyneuropathy: Secondary | ICD-10-CM | POA: Diagnosis not present

## 2016-04-24 DIAGNOSIS — E034 Atrophy of thyroid (acquired): Secondary | ICD-10-CM | POA: Diagnosis not present

## 2016-04-24 DIAGNOSIS — N182 Chronic kidney disease, stage 2 (mild): Secondary | ICD-10-CM | POA: Diagnosis not present

## 2016-04-24 DIAGNOSIS — M25511 Pain in right shoulder: Secondary | ICD-10-CM | POA: Diagnosis not present

## 2016-04-24 DIAGNOSIS — D641 Secondary sideroblastic anemia due to disease: Secondary | ICD-10-CM | POA: Diagnosis not present

## 2016-04-24 DIAGNOSIS — E782 Mixed hyperlipidemia: Secondary | ICD-10-CM | POA: Diagnosis not present

## 2016-04-24 DIAGNOSIS — Z23 Encounter for immunization: Secondary | ICD-10-CM | POA: Diagnosis not present

## 2016-04-24 DIAGNOSIS — B079 Viral wart, unspecified: Secondary | ICD-10-CM | POA: Diagnosis not present

## 2016-04-24 DIAGNOSIS — D631 Anemia in chronic kidney disease: Secondary | ICD-10-CM | POA: Diagnosis not present

## 2016-05-19 DIAGNOSIS — N182 Chronic kidney disease, stage 2 (mild): Secondary | ICD-10-CM | POA: Diagnosis not present

## 2016-05-19 DIAGNOSIS — D631 Anemia in chronic kidney disease: Secondary | ICD-10-CM | POA: Diagnosis not present

## 2016-06-05 ENCOUNTER — Other Ambulatory Visit: Payer: Self-pay | Admitting: Endocrinology

## 2016-06-06 DIAGNOSIS — M25311 Other instability, right shoulder: Secondary | ICD-10-CM | POA: Diagnosis not present

## 2016-06-06 DIAGNOSIS — M25511 Pain in right shoulder: Secondary | ICD-10-CM | POA: Diagnosis not present

## 2016-06-20 DIAGNOSIS — E1122 Type 2 diabetes mellitus with diabetic chronic kidney disease: Secondary | ICD-10-CM | POA: Diagnosis not present

## 2016-06-20 DIAGNOSIS — D631 Anemia in chronic kidney disease: Secondary | ICD-10-CM | POA: Diagnosis not present

## 2016-06-20 DIAGNOSIS — I129 Hypertensive chronic kidney disease with stage 1 through stage 4 chronic kidney disease, or unspecified chronic kidney disease: Secondary | ICD-10-CM | POA: Diagnosis not present

## 2016-06-20 DIAGNOSIS — I10 Essential (primary) hypertension: Secondary | ICD-10-CM | POA: Diagnosis not present

## 2016-06-20 DIAGNOSIS — E119 Type 2 diabetes mellitus without complications: Secondary | ICD-10-CM | POA: Diagnosis not present

## 2016-06-20 DIAGNOSIS — N182 Chronic kidney disease, stage 2 (mild): Secondary | ICD-10-CM | POA: Diagnosis not present

## 2016-07-10 DIAGNOSIS — E119 Type 2 diabetes mellitus without complications: Secondary | ICD-10-CM | POA: Diagnosis not present

## 2016-07-10 DIAGNOSIS — H2513 Age-related nuclear cataract, bilateral: Secondary | ICD-10-CM | POA: Diagnosis not present

## 2016-07-20 DIAGNOSIS — N182 Chronic kidney disease, stage 2 (mild): Secondary | ICD-10-CM | POA: Diagnosis not present

## 2016-07-20 DIAGNOSIS — D631 Anemia in chronic kidney disease: Secondary | ICD-10-CM | POA: Diagnosis not present

## 2016-07-31 DIAGNOSIS — E782 Mixed hyperlipidemia: Secondary | ICD-10-CM | POA: Diagnosis not present

## 2016-07-31 DIAGNOSIS — K5903 Drug induced constipation: Secondary | ICD-10-CM | POA: Diagnosis not present

## 2016-07-31 DIAGNOSIS — D641 Secondary sideroblastic anemia due to disease: Secondary | ICD-10-CM | POA: Diagnosis not present

## 2016-07-31 DIAGNOSIS — E034 Atrophy of thyroid (acquired): Secondary | ICD-10-CM | POA: Diagnosis not present

## 2016-07-31 DIAGNOSIS — I10 Essential (primary) hypertension: Secondary | ICD-10-CM | POA: Diagnosis not present

## 2016-07-31 DIAGNOSIS — M25511 Pain in right shoulder: Secondary | ICD-10-CM | POA: Diagnosis not present

## 2016-07-31 DIAGNOSIS — E1142 Type 2 diabetes mellitus with diabetic polyneuropathy: Secondary | ICD-10-CM | POA: Diagnosis not present

## 2016-08-21 DIAGNOSIS — D631 Anemia in chronic kidney disease: Secondary | ICD-10-CM | POA: Diagnosis not present

## 2016-08-21 DIAGNOSIS — N182 Chronic kidney disease, stage 2 (mild): Secondary | ICD-10-CM | POA: Diagnosis not present

## 2016-09-18 ENCOUNTER — Other Ambulatory Visit: Payer: Self-pay

## 2016-09-18 ENCOUNTER — Other Ambulatory Visit: Payer: Self-pay | Admitting: Endocrinology

## 2016-09-18 MED ORDER — ONETOUCH DELICA LANCETS 33G MISC: 4 refills | Status: DC

## 2016-09-18 MED ORDER — GLUCOSE BLOOD VI STRP: ORAL_STRIP | 5 refills | Status: DC

## 2016-09-20 DIAGNOSIS — E119 Type 2 diabetes mellitus without complications: Secondary | ICD-10-CM | POA: Diagnosis not present

## 2016-09-20 DIAGNOSIS — I10 Essential (primary) hypertension: Secondary | ICD-10-CM | POA: Diagnosis not present

## 2016-09-20 DIAGNOSIS — N182 Chronic kidney disease, stage 2 (mild): Secondary | ICD-10-CM | POA: Diagnosis not present

## 2016-09-20 DIAGNOSIS — D631 Anemia in chronic kidney disease: Secondary | ICD-10-CM | POA: Diagnosis not present

## 2016-09-20 DIAGNOSIS — N189 Chronic kidney disease, unspecified: Secondary | ICD-10-CM | POA: Diagnosis not present

## 2016-09-27 DIAGNOSIS — J811 Chronic pulmonary edema: Secondary | ICD-10-CM | POA: Diagnosis not present

## 2016-09-27 DIAGNOSIS — Z85038 Personal history of other malignant neoplasm of large intestine: Secondary | ICD-10-CM | POA: Diagnosis not present

## 2016-09-27 DIAGNOSIS — I2510 Atherosclerotic heart disease of native coronary artery without angina pectoris: Secondary | ICD-10-CM | POA: Diagnosis present

## 2016-09-27 DIAGNOSIS — R51 Headache: Secondary | ICD-10-CM | POA: Diagnosis not present

## 2016-09-27 DIAGNOSIS — E785 Hyperlipidemia, unspecified: Secondary | ICD-10-CM | POA: Diagnosis present

## 2016-09-27 DIAGNOSIS — I441 Atrioventricular block, second degree: Secondary | ICD-10-CM | POA: Diagnosis not present

## 2016-09-27 DIAGNOSIS — Z794 Long term (current) use of insulin: Secondary | ICD-10-CM | POA: Diagnosis not present

## 2016-09-27 DIAGNOSIS — I080 Rheumatic disorders of both mitral and aortic valves: Secondary | ICD-10-CM | POA: Diagnosis present

## 2016-09-27 DIAGNOSIS — R011 Cardiac murmur, unspecified: Secondary | ICD-10-CM | POA: Diagnosis not present

## 2016-09-27 DIAGNOSIS — R001 Bradycardia, unspecified: Secondary | ICD-10-CM | POA: Diagnosis not present

## 2016-09-27 DIAGNOSIS — F419 Anxiety disorder, unspecified: Secondary | ICD-10-CM | POA: Diagnosis present

## 2016-09-27 DIAGNOSIS — F329 Major depressive disorder, single episode, unspecified: Secondary | ICD-10-CM | POA: Diagnosis present

## 2016-09-27 DIAGNOSIS — R0602 Shortness of breath: Secondary | ICD-10-CM | POA: Diagnosis not present

## 2016-09-27 DIAGNOSIS — E1129 Type 2 diabetes mellitus with other diabetic kidney complication: Secondary | ICD-10-CM | POA: Diagnosis not present

## 2016-09-27 DIAGNOSIS — I442 Atrioventricular block, complete: Secondary | ICD-10-CM | POA: Diagnosis not present

## 2016-09-27 DIAGNOSIS — R0789 Other chest pain: Secondary | ICD-10-CM | POA: Diagnosis not present

## 2016-09-27 DIAGNOSIS — I10 Essential (primary) hypertension: Secondary | ICD-10-CM | POA: Diagnosis not present

## 2016-09-27 DIAGNOSIS — D649 Anemia, unspecified: Secondary | ICD-10-CM | POA: Diagnosis present

## 2016-09-27 DIAGNOSIS — Z950 Presence of cardiac pacemaker: Secondary | ICD-10-CM | POA: Diagnosis not present

## 2016-09-27 DIAGNOSIS — Z6823 Body mass index (BMI) 23.0-23.9, adult: Secondary | ICD-10-CM | POA: Diagnosis not present

## 2016-09-27 DIAGNOSIS — E1140 Type 2 diabetes mellitus with diabetic neuropathy, unspecified: Secondary | ICD-10-CM | POA: Diagnosis present

## 2016-09-27 DIAGNOSIS — I459 Conduction disorder, unspecified: Secondary | ICD-10-CM | POA: Diagnosis present

## 2016-09-27 DIAGNOSIS — Z7982 Long term (current) use of aspirin: Secondary | ICD-10-CM | POA: Diagnosis not present

## 2016-09-27 HISTORY — PX: OTHER SURGICAL HISTORY: SHX169

## 2016-10-09 DIAGNOSIS — Z45018 Encounter for adjustment and management of other part of cardiac pacemaker: Secondary | ICD-10-CM | POA: Diagnosis not present

## 2016-10-09 DIAGNOSIS — I471 Supraventricular tachycardia: Secondary | ICD-10-CM | POA: Diagnosis not present

## 2016-10-09 DIAGNOSIS — E119 Type 2 diabetes mellitus without complications: Secondary | ICD-10-CM | POA: Diagnosis not present

## 2016-10-09 DIAGNOSIS — I442 Atrioventricular block, complete: Secondary | ICD-10-CM | POA: Diagnosis not present

## 2016-10-09 DIAGNOSIS — I5021 Acute systolic (congestive) heart failure: Secondary | ICD-10-CM | POA: Diagnosis not present

## 2016-10-09 DIAGNOSIS — M7989 Other specified soft tissue disorders: Secondary | ICD-10-CM | POA: Diagnosis not present

## 2016-10-09 DIAGNOSIS — I82622 Acute embolism and thrombosis of deep veins of left upper extremity: Secondary | ICD-10-CM | POA: Diagnosis not present

## 2016-10-13 DIAGNOSIS — Z45018 Encounter for adjustment and management of other part of cardiac pacemaker: Secondary | ICD-10-CM | POA: Diagnosis not present

## 2016-10-13 DIAGNOSIS — I82A12 Acute embolism and thrombosis of left axillary vein: Secondary | ICD-10-CM | POA: Diagnosis not present

## 2016-10-13 DIAGNOSIS — I442 Atrioventricular block, complete: Secondary | ICD-10-CM | POA: Diagnosis not present

## 2016-10-13 DIAGNOSIS — I499 Cardiac arrhythmia, unspecified: Secondary | ICD-10-CM | POA: Diagnosis not present

## 2016-10-13 DIAGNOSIS — Z6824 Body mass index (BMI) 24.0-24.9, adult: Secondary | ICD-10-CM | POA: Diagnosis not present

## 2016-10-13 DIAGNOSIS — Z86718 Personal history of other venous thrombosis and embolism: Secondary | ICD-10-CM | POA: Insufficient documentation

## 2016-10-13 DIAGNOSIS — M7989 Other specified soft tissue disorders: Secondary | ICD-10-CM | POA: Diagnosis not present

## 2016-10-19 DIAGNOSIS — I499 Cardiac arrhythmia, unspecified: Secondary | ICD-10-CM | POA: Diagnosis not present

## 2016-10-19 DIAGNOSIS — I442 Atrioventricular block, complete: Secondary | ICD-10-CM | POA: Diagnosis not present

## 2016-10-23 DIAGNOSIS — D631 Anemia in chronic kidney disease: Secondary | ICD-10-CM | POA: Diagnosis not present

## 2016-10-23 DIAGNOSIS — N182 Chronic kidney disease, stage 2 (mild): Secondary | ICD-10-CM | POA: Diagnosis not present

## 2016-10-26 DIAGNOSIS — I442 Atrioventricular block, complete: Secondary | ICD-10-CM | POA: Diagnosis not present

## 2016-10-26 DIAGNOSIS — I471 Supraventricular tachycardia: Secondary | ICD-10-CM | POA: Diagnosis not present

## 2016-10-26 DIAGNOSIS — I10 Essential (primary) hypertension: Secondary | ICD-10-CM | POA: Diagnosis not present

## 2016-10-26 DIAGNOSIS — I82622 Acute embolism and thrombosis of deep veins of left upper extremity: Secondary | ICD-10-CM | POA: Diagnosis not present

## 2016-11-01 ENCOUNTER — Other Ambulatory Visit: Payer: Self-pay | Admitting: Endocrinology

## 2016-11-09 DIAGNOSIS — E1121 Type 2 diabetes mellitus with diabetic nephropathy: Secondary | ICD-10-CM | POA: Diagnosis not present

## 2016-11-09 DIAGNOSIS — E119 Type 2 diabetes mellitus without complications: Secondary | ICD-10-CM | POA: Diagnosis not present

## 2016-11-09 DIAGNOSIS — I442 Atrioventricular block, complete: Secondary | ICD-10-CM | POA: Diagnosis not present

## 2016-11-09 DIAGNOSIS — E782 Mixed hyperlipidemia: Secondary | ICD-10-CM | POA: Diagnosis not present

## 2016-11-09 DIAGNOSIS — I119 Hypertensive heart disease without heart failure: Secondary | ICD-10-CM | POA: Diagnosis not present

## 2016-11-09 LAB — LIPID PANEL: LDL CALC: 69

## 2016-11-14 DIAGNOSIS — I4719 Other supraventricular tachycardia: Secondary | ICD-10-CM | POA: Insufficient documentation

## 2016-11-22 DIAGNOSIS — I5032 Chronic diastolic (congestive) heart failure: Secondary | ICD-10-CM | POA: Diagnosis not present

## 2016-12-18 DIAGNOSIS — I10 Essential (primary) hypertension: Secondary | ICD-10-CM | POA: Diagnosis not present

## 2016-12-18 DIAGNOSIS — E1122 Type 2 diabetes mellitus with diabetic chronic kidney disease: Secondary | ICD-10-CM | POA: Diagnosis not present

## 2016-12-18 DIAGNOSIS — N189 Chronic kidney disease, unspecified: Secondary | ICD-10-CM | POA: Diagnosis not present

## 2016-12-18 DIAGNOSIS — I129 Hypertensive chronic kidney disease with stage 1 through stage 4 chronic kidney disease, or unspecified chronic kidney disease: Secondary | ICD-10-CM | POA: Diagnosis not present

## 2016-12-18 DIAGNOSIS — D631 Anemia in chronic kidney disease: Secondary | ICD-10-CM | POA: Diagnosis not present

## 2016-12-18 DIAGNOSIS — E119 Type 2 diabetes mellitus without complications: Secondary | ICD-10-CM | POA: Diagnosis not present

## 2017-01-09 ENCOUNTER — Other Ambulatory Visit: Payer: Self-pay | Admitting: Endocrinology

## 2017-01-09 DIAGNOSIS — L814 Other melanin hyperpigmentation: Secondary | ICD-10-CM | POA: Diagnosis not present

## 2017-01-09 DIAGNOSIS — R233 Spontaneous ecchymoses: Secondary | ICD-10-CM | POA: Diagnosis not present

## 2017-01-16 DIAGNOSIS — N182 Chronic kidney disease, stage 2 (mild): Secondary | ICD-10-CM | POA: Diagnosis not present

## 2017-01-16 DIAGNOSIS — D631 Anemia in chronic kidney disease: Secondary | ICD-10-CM | POA: Diagnosis not present

## 2017-01-18 ENCOUNTER — Ambulatory Visit (INDEPENDENT_AMBULATORY_CARE_PROVIDER_SITE_OTHER): Payer: Medicare Other | Admitting: Endocrinology

## 2017-01-18 ENCOUNTER — Ambulatory Visit: Payer: Medicare Other | Admitting: Endocrinology

## 2017-01-18 ENCOUNTER — Encounter: Payer: Self-pay | Admitting: Endocrinology

## 2017-01-18 VITALS — BP 142/76 | HR 68 | Ht 62.0 in | Wt 126.0 lb

## 2017-01-18 DIAGNOSIS — E1165 Type 2 diabetes mellitus with hyperglycemia: Secondary | ICD-10-CM | POA: Diagnosis not present

## 2017-01-18 DIAGNOSIS — E041 Nontoxic single thyroid nodule: Secondary | ICD-10-CM

## 2017-01-18 LAB — COMPREHENSIVE METABOLIC PANEL
ALK PHOS: 45 U/L (ref 39–117)
ALT: 18 U/L (ref 0–35)
AST: 33 U/L (ref 0–37)
Albumin: 3.7 g/dL (ref 3.5–5.2)
BUN: 36 mg/dL — AB (ref 6–23)
CHLORIDE: 101 meq/L (ref 96–112)
CO2: 31 meq/L (ref 19–32)
Calcium: 9.6 mg/dL (ref 8.4–10.5)
Creatinine, Ser: 1.24 mg/dL — ABNORMAL HIGH (ref 0.40–1.20)
GFR: 44.96 mL/min — ABNORMAL LOW (ref >60.00)
GLUCOSE: 100 mg/dL — AB (ref 70–99)
POTASSIUM: 4.1 meq/L (ref 3.5–5.1)
SODIUM: 139 meq/L (ref 135–145)
TOTAL PROTEIN: 6.3 g/dL (ref 6.0–8.3)
Total Bilirubin: 0.4 mg/dL (ref 0.2–1.2)

## 2017-01-18 LAB — MICROALBUMIN / CREATININE URINE RATIO
Creatinine,U: 69.2 mg/dL
Microalb Creat Ratio: 1 mg/g (ref 0.0–30.0)
Microalb, Ur: <0.7 mg/dL (ref 0.0–1.9)

## 2017-01-18 LAB — TSH: TSH: 1.7 u[IU]/mL (ref 0.35–4.50)

## 2017-01-18 LAB — POCT GLYCOSYLATED HEMOGLOBIN (HGB A1C): HEMOGLOBIN A1C: 5.9

## 2017-01-18 NOTE — Patient Instructions (Addendum)
Check blood sugars on waking up 3/7 days   Also check blood sugars about 2 hours after a meal and do this after different meals by rotation  Recommended blood sugar levels on waking up is 90-130 and about 2 hours after meal is 130-160  Please bring your blood sugar monitor to each visit, thank you  Start taking 2 metformin daily, may take them both right after supper  Start taking Actos/pioglitazone every other day until finished and then he will start 15 mg daily  Start walking regularly  May take OTC magnesium oxide 400 mg or similar supplement at bedtime Also alternatively may take 6-8 ounces of tonic water at bedtime

## 2017-01-18 NOTE — Progress Notes (Signed)
Patient ID: Bridget [name], female   DOB: 12-Jul-1941, 76 y.o.   MRN: [id number]   Reason for Appointment: Diabetes follow-up   History of Present Illness   Diagnosis: Type 2 DIABETES MELITUS, date of diagnosis:  2007     Previous history: Her A1c at diagnosis was 7.9% She has been treated with a combination of metformin ER and Actos for several years Usually her A1c has been upper normal and her last A1c was 5.8 in 4/14  Recent history:  Current A1c is 5.9 and has been about the same previously   She is again checking blood sugars only in the morning and not after meals as directed  Because of cardiac issues she has not done much exercise or walking  Has gained a couple of pounds since her last visit  She is trying to watch her portions fairly well   Actos has been continued because of long history of benefit from this and lack of side effects, however she thinks she is getting some swelling in her ankles now       Oral hypoglycemic drugs:   metformin ER 500 mg, 1 tablet a day , Actos 30 mg daily         Side effects from medications: None       Monitors blood glucose:  once a day.    Glucometer: One Touch.          Blood Glucose readings from monitor showed  FASTING range 103-151 with average 124     Meals: 3 meals per day.follows a healthy diet, portions controlled  Physical activity: exercise: some walking recently            Dietician visit: Most recent: 2007            Wt Readings from Last 3 Encounters:  01/18/17 126 lb (57.2 kg)  02/29/16 124 lb (56.2 kg)  09/01/15 110 lb 9.6 oz (50.2 kg)   Lab Results  Component Value Date   HGBA1C 5.9 01/18/2017   HGBA1C 5.6 02/29/2016   HGBA1C 5.6 09/01/2015   Lab Results  Component Value Date   MICROALBUR 1.4 06/01/2015   LDLCALC 94 06/01/2015   CREATININE 1.06 02/29/2016     Allergies as of 01/18/2017      Reactions   Atorvastatin Other (See Comments)   "muscle Cramps"   Budesonide Other (See  Comments)   "fatigue"   Gabapentin Other (See Comments)   "fatigue"   Metoclopramide Other (See Comments)   "mayalgia"   Rosuvastatin Other (See Comments)   "muscle cramps"   Simvastatin Other (See Comments)   "muscle Cramps"   Sulfamethoxazole-trimethoprim Rash      Medication List       Accurate as of 01/18/17 10:30 AM. Always use your most recent med list.          amLODipine 5 MG tablet Commonly known as:  NORVASC Take 5 mg by mouth daily.   aspirin 325 MG tablet Take 325 mg by mouth daily.   carvedilol 25 MG tablet Commonly known as:  COREG Take 25 mg by mouth 2 (two) times daily with a meal.   fenofibrate 160 MG tablet Take 160 mg by mouth daily.   furosemide 20 MG tablet Commonly known as:  LASIX Take 20 mg by mouth daily.   glucose blood test strip Commonly known as:  ONE TOUCH ULTRA TEST USE ONE STRIP TO CHECK GLUCOSE THREE TIMES DAILY- Dx code E11.9   lisinopril-hydrochlorothiazide  20-12.5 MG tablet Commonly known as:  PRINZIDE,ZESTORETIC Take by mouth daily. 1/2 tab daily   Magnesium 250 MG Tabs Take 250 mg by mouth daily.   metFORMIN 500 MG 24 hr tablet Commonly known as:  GLUCOPHAGE-XR TAKE 2 TABLETS BY MOUTH TWICE DAILY   metoprolol succinate 25 MG 24 hr tablet Commonly known as:  TOPROL-XL Take 25 mg by mouth daily. 1/2 tab daily   MULTI VITAMIN DAILY PO Take by mouth daily.   nitroGLYCERIN 0.4 MG SL tablet Commonly known as:  NITROSTAT Place 0.4 mg under the tongue every 5 (five) minutes as needed for chest pain.   ONETOUCH DELICA LANCETS 33G Misc Use to check blood sugars 3 times per day dx code E11.9   pioglitazone 30 MG tablet Commonly known as:  ACTOS TAKE ONE TABLET BY MOUTH ONCE DAILY   polyethylene glycol packet Commonly known as:  MIRALAX / GLYCOLAX Take 17 g by mouth daily as needed.   PROAIR HFA 108 (90 Base) MCG/ACT inhaler Generic drug:  albuterol Inhale into the lungs every 4 (four) hours as needed for wheezing  or shortness of breath.   Rivaroxaban 15 MG Tabs tablet Commonly known as:  XARELTO Take 15 mg by mouth daily.   sertraline 100 MG tablet Commonly known as:  ZOLOFT Reported on 09/01/2015   venlafaxine 75 MG tablet Commonly known as:  EFFEXOR Take 75 mg by mouth daily.   Vitamin D-3 1000 units Caps Take 1,000 Units by mouth daily.       Allergies:  Allergies  Allergen Reactions  . Atorvastatin Other (See Comments)    "muscle Cramps"  . Budesonide Other (See Comments)    "fatigue"  . Gabapentin Other (See Comments)    "fatigue"  . Metoclopramide Other (See Comments)    "mayalgia"  . Rosuvastatin Other (See Comments)    "muscle cramps"  . Simvastatin Other (See Comments)    "muscle Cramps"  . Sulfamethoxazole-Trimethoprim Rash    Past Medical History:  Diagnosis Date  . Diabetes mellitus without complication (HCC)   . Thyroid disease     No past surgical history on file.  No family history on file.  Social History:  reports that she has never smoked. She has never used smokeless tobacco. Her alcohol and drug histories are not on file.  Review of Systems:    Hypertension:  blood pressure is well-controlled with Coreg, amlodipine and Zestoretic, followed by PCP and cardiologist  Lipids: She has had hypertriglyceridemia controlled with fenofibrate: Last lipid panel in March showed normal levels  Not on a statin drug  Lab Results  Component Value Date   CHOL 174 06/01/2015   HDL 52.10 06/01/2015   LDLCALC 94 06/01/2015   LDLDIRECT 94.2 12/05/2013   TRIG 140.0 06/01/2015   CHOLHDL 3 06/01/2015     THYROID:  She was previously on thyroid suppression because of her long-standing benign thyroid nodule.  She has had 2 biopsies done on this previously Because of low normal TSH her 50 mcg Synthroid was stopped on the visit in 10/14 TSH normal subsequently  Her ultrasound last showed mostly cystic nodule in the right side about 1.6 cm  Lab Results    Component Value Date   TSH 0.74 02/29/2016   TSH 1.33 06/01/2015   TSH 1.02 12/05/2013    She is on Xarelto for DVT Asking about nocturnal muscle cramps   Examination:   BP (!) 142/76   Pulse 68   Ht 5\' 2"  (1.575 m)  Wt 126 lb (57.2 kg)   SpO2 97%   BMI 23.05 kg/m   Body mass index is 23.05 kg/m.   Right-sided thyroid nodule palpable, about 2 cm, soft to firm, felt on swallowing and relatively posterior  Diabetic Foot Exam - Simple   Simple Foot Form Diabetic Foot exam was performed with the following findings:  Yes   Visual Inspection No deformities, no ulcerations, no other skin breakdown bilaterally:  Yes See comments:  Yes Sensation Testing Intact to touch and monofilament testing bilaterally:  Yes Pulse Check Posterior Tibialis and Dorsalis pulse intact bilaterally:  Yes Comments 1+ pedal edema present     ASSESSMENT/ PLAN:   Diabetes type 2   Her blood sugars are fairly good with A1c 5.9 She is concerned that her fasting readings are slightly higher than usual Also she is having some tendency to getting edema, has had various cardiac problems recently Not able to exercise much lately  Recommendations: She can increase her metformin to 2 tablets and reduce Actos to every other day Next prescription will be 15 mg Actos Restart consistent exercise when able to  THYROID nodule: Mostly cystic, proven benign and clinically stable  Hypertension: Appears controlled  Muscle cramps: She can either take magnesium or tonic water at bedtime   Patient Instructions  Check blood sugars on waking up 3/7 days   Also check blood sugars about 2 hours after a meal and do this after different meals by rotation  Recommended blood sugar levels on waking up is 90-130 and about 2 hours after meal is 130-160  Please bring your blood sugar monitor to each visit, thank you  Start taking 2 metformin daily, may take them both right after supper  Start taking  Actos/pioglitazone every other day until finished and then he will start 15 mg daily  Start walking regularly  May take OTC magnesium oxide 400 mg or similar supplement at bedtime Also alternatively may take 6-8 ounces of tonic water at bedtime      Caven Perine 01/18/2017, 10:30 AM

## 2017-01-24 ENCOUNTER — Other Ambulatory Visit: Payer: Self-pay

## 2017-01-24 MED ORDER — METFORMIN HCL ER 500 MG PO TB24: 1000.0000 mg | ORAL_TABLET | Freq: Two times a day (BID) | ORAL | 0 refills | Status: DC

## 2017-02-08 DIAGNOSIS — I442 Atrioventricular block, complete: Secondary | ICD-10-CM | POA: Diagnosis not present

## 2017-02-10 ENCOUNTER — Other Ambulatory Visit: Payer: Self-pay | Admitting: Endocrinology

## 2017-02-16 DIAGNOSIS — D631 Anemia in chronic kidney disease: Secondary | ICD-10-CM | POA: Diagnosis not present

## 2017-02-16 DIAGNOSIS — N182 Chronic kidney disease, stage 2 (mild): Secondary | ICD-10-CM | POA: Diagnosis not present

## 2017-02-23 ENCOUNTER — Other Ambulatory Visit: Payer: Self-pay

## 2017-02-23 MED ORDER — ONETOUCH DELICA LANCETS 33G MISC: 1 refills | Status: DC

## 2017-02-28 ENCOUNTER — Other Ambulatory Visit: Payer: Self-pay

## 2017-02-28 MED ORDER — ONETOUCH DELICA LANCETS 33G MISC: 1 refills | Status: DC

## 2017-03-01 DIAGNOSIS — I5032 Chronic diastolic (congestive) heart failure: Secondary | ICD-10-CM | POA: Diagnosis not present

## 2017-03-01 DIAGNOSIS — Z45018 Encounter for adjustment and management of other part of cardiac pacemaker: Secondary | ICD-10-CM | POA: Diagnosis not present

## 2017-03-01 DIAGNOSIS — Z6822 Body mass index (BMI) 22.0-22.9, adult: Secondary | ICD-10-CM | POA: Diagnosis not present

## 2017-03-01 DIAGNOSIS — I442 Atrioventricular block, complete: Secondary | ICD-10-CM | POA: Diagnosis not present

## 2017-03-01 DIAGNOSIS — E119 Type 2 diabetes mellitus without complications: Secondary | ICD-10-CM | POA: Diagnosis not present

## 2017-03-01 DIAGNOSIS — I471 Supraventricular tachycardia: Secondary | ICD-10-CM | POA: Diagnosis not present

## 2017-03-19 DIAGNOSIS — N182 Chronic kidney disease, stage 2 (mild): Secondary | ICD-10-CM | POA: Diagnosis not present

## 2017-03-19 DIAGNOSIS — D631 Anemia in chronic kidney disease: Secondary | ICD-10-CM | POA: Diagnosis not present

## 2017-03-26 DIAGNOSIS — Z6823 Body mass index (BMI) 23.0-23.9, adult: Secondary | ICD-10-CM | POA: Diagnosis not present

## 2017-03-26 DIAGNOSIS — Z45018 Encounter for adjustment and management of other part of cardiac pacemaker: Secondary | ICD-10-CM | POA: Diagnosis not present

## 2017-03-26 DIAGNOSIS — I82A12 Acute embolism and thrombosis of left axillary vein: Secondary | ICD-10-CM | POA: Diagnosis not present

## 2017-03-26 DIAGNOSIS — I10 Essential (primary) hypertension: Secondary | ICD-10-CM | POA: Diagnosis not present

## 2017-03-26 DIAGNOSIS — I442 Atrioventricular block, complete: Secondary | ICD-10-CM | POA: Diagnosis not present

## 2017-03-26 DIAGNOSIS — I471 Supraventricular tachycardia: Secondary | ICD-10-CM | POA: Diagnosis not present

## 2017-03-27 DIAGNOSIS — I442 Atrioventricular block, complete: Secondary | ICD-10-CM | POA: Diagnosis not present

## 2017-04-17 DIAGNOSIS — D631 Anemia in chronic kidney disease: Secondary | ICD-10-CM | POA: Diagnosis not present

## 2017-04-17 DIAGNOSIS — N182 Chronic kidney disease, stage 2 (mild): Secondary | ICD-10-CM | POA: Diagnosis not present

## 2017-05-14 DIAGNOSIS — D631 Anemia in chronic kidney disease: Secondary | ICD-10-CM | POA: Diagnosis not present

## 2017-05-14 DIAGNOSIS — N182 Chronic kidney disease, stage 2 (mild): Secondary | ICD-10-CM | POA: Diagnosis not present

## 2017-05-21 ENCOUNTER — Ambulatory Visit: Payer: Medicare Other | Admitting: Endocrinology

## 2017-06-05 ENCOUNTER — Encounter: Payer: Self-pay | Admitting: Endocrinology

## 2017-06-05 ENCOUNTER — Ambulatory Visit (INDEPENDENT_AMBULATORY_CARE_PROVIDER_SITE_OTHER): Payer: Medicare Other | Admitting: Endocrinology

## 2017-06-05 VITALS — BP 150/78 | HR 68 | Ht 62.0 in | Wt 130.8 lb

## 2017-06-05 DIAGNOSIS — Z23 Encounter for immunization: Secondary | ICD-10-CM

## 2017-06-05 DIAGNOSIS — E1165 Type 2 diabetes mellitus with hyperglycemia: Secondary | ICD-10-CM | POA: Diagnosis not present

## 2017-06-05 LAB — POCT GLYCOSYLATED HEMOGLOBIN (HGB A1C): Hemoglobin A1C: 5.1

## 2017-06-05 NOTE — Progress Notes (Signed)
Patient ID: Bridget [name], female   DOB: 1941-04-17, 76 y.o.   MRN: [id number]   Reason for Appointment: Diabetes follow-up   History of Present Illness   Diagnosis: Type 2 DIABETES MELITUS, date of diagnosis:  2007     Previous history: Her A1c at diagnosis was 7.9% She has been treated with a combination of metformin ER and Actos for several years Usually her A1c has been upper normal and her last A1c was 5.8 in 4/14  Recent history:  Current A1c is 5.1 previously up to 5.9   She is again having higher blood sugars at home than expected for her A1c level  Recently FASTING blood sugars are averaging about 127 but she does have some higher readings later in the day  Most of her sugars are around lunchtime which are as high as 183  She is sometimes eating more high carbohydrate breakfast meals and her weight is going up  Actos was reduced to 15 mg on the last visit because of some tendency to edema  Still taking metformin twice a day She is not able to do as much exercise that she can because of various issues and knee problems     Oral hypoglycemic drugs:   metformin ER 500 mg, 1 tablet 2x a day , Actos 30 mg 1/2 daily         Side effects from medications: None       Monitors blood glucose:  once a day.    Glucometer: One Touch.          Blood Glucose readings from monitor As above  Overall median 130 Blood sugar range 98-183      Meals: 3 meals per day. Usually follows a healthy diet, portions controlled  Physical activity: exercise: some walking             Dietician visit: Most recent: 2007            Wt Readings from Last 3 Encounters:  06/05/17 130 lb 12.8 oz (59.3 kg)  01/18/17 126 lb (57.2 kg)  02/29/16 124 lb (56.2 kg)   Lab Results  Component Value Date   HGBA1C 5.9 01/18/2017   HGBA1C 5.6 02/29/2016   HGBA1C 5.6 09/01/2015   Lab Results  Component Value Date   MICROALBUR <0.7 01/18/2017   LDLCALC 69 11/09/2016   CREATININE 1.24  (H) 01/18/2017     Allergies as of 06/05/2017      Reactions   Atorvastatin Other (See Comments)   "muscle Cramps"   Budesonide Other (See Comments)   "fatigue"   Gabapentin Other (See Comments)   "fatigue"   Metoclopramide Other (See Comments)   "mayalgia"   Rosuvastatin Other (See Comments)   "muscle cramps"   Simvastatin Other (See Comments)   "muscle Cramps"   Sulfamethoxazole-trimethoprim Rash      Medication List       Accurate as of 06/05/17  1:06 PM. Always use your most recent med list.          amLODipine 5 MG tablet Commonly known as:  NORVASC Take 5 mg by mouth daily.   aspirin EC 81 MG tablet Take 81 mg by mouth daily.   carvedilol 25 MG tablet Commonly known as:  COREG Take 25 mg by mouth 2 (two) times daily with a meal.   fenofibrate 160 MG tablet Take 160 mg by mouth daily.   furosemide 20 MG tablet Commonly known as:  LASIX Take 20 mg  by mouth daily.   glucose blood test strip Commonly known as:  ONE TOUCH ULTRA TEST USE ONE STRIP TO CHECK GLUCOSE THREE TIMES DAILY- Dx code E11.9   Magnesium 250 MG Tabs Take 250 mg by mouth daily.   metFORMIN 500 MG 24 hr tablet Commonly known as:  GLUCOPHAGE-XR Take 2 tablets (1,000 mg total) by mouth 2 (two) times daily.   MULTI VITAMIN DAILY PO Take by mouth daily.   nitroGLYCERIN 0.4 MG SL tablet Commonly known as:  NITROSTAT Place 0.4 mg under the tongue every 5 (five) minutes as needed for chest pain.   ONETOUCH DELICA LANCETS 33G Misc USE TO CHECK GLUCOSE 2-3 DAILY, Dx Code E11.9   pioglitazone 30 MG tablet Commonly known as:  ACTOS TAKE ONE TABLET BY MOUTH ONCE DAILY   polyethylene glycol packet Commonly known as:  MIRALAX / GLYCOLAX Take 17 g by mouth daily as needed.   PROAIR HFA 108 (90 Base) MCG/ACT inhaler Generic drug:  albuterol Inhale into the lungs every 4 (four) hours as needed for wheezing or shortness of breath.   Rivaroxaban 15 MG Tabs tablet Commonly known as:   XARELTO Take 15 mg by mouth daily.   sertraline 100 MG tablet Commonly known as:  ZOLOFT Reported on 09/01/2015   venlafaxine 75 MG tablet Commonly known as:  EFFEXOR Take 75 mg by mouth daily.   Vitamin D-3 1000 units Caps Take 1,000 Units by mouth daily.       Allergies:  Allergies  Allergen Reactions  . Atorvastatin Other (See Comments)    "muscle Cramps"  . Budesonide Other (See Comments)    "fatigue"  . Gabapentin Other (See Comments)    "fatigue"  . Metoclopramide Other (See Comments)    "mayalgia"  . Rosuvastatin Other (See Comments)    "muscle cramps"  . Simvastatin Other (See Comments)    "muscle Cramps"  . Sulfamethoxazole-Trimethoprim Rash    Past Medical History:  Diagnosis Date  . Diabetes mellitus without complication (HCC)   . Thyroid disease     No past surgical history on file.  No family history on file.  Social History:  reports that she has never smoked. She has never used smokeless tobacco. Her alcohol and drug histories are not on file.  Review of Systems:    Hypertension:  blood pressure is well-controlled with Coreg, amlodipine and Zestoretic, followed by PCP and cardiologist Home BP Recently 130-150  Lipids: She has had hypertriglyceridemia controlled with fenofibrate: Last lipid panel in March showed normal levels  She is due to have follow-up with PCP  Lab Results  Component Value Date   CHOL 174 06/01/2015   HDL 52.10 06/01/2015   LDLCALC 69 11/09/2016   LDLDIRECT 94.2 12/05/2013   TRIG 140.0 06/01/2015   CHOLHDL 3 06/01/2015     THYROID:  She was previously on thyroid suppression because of her long-standing benign thyroid nodule.  She has had 2 biopsies done on this previously Because of low normal TSH her 50 mcg Synthroid was stopped on the visit in 10/14 TSH normal subsequently  Her ultrasound last showed mostly cystic nodule in the right side about 1.6 cm  Lab Results  Component Value Date   TSH 1.70  01/18/2017   TSH 0.74 02/29/2016   TSH 1.33 06/01/2015    She is on Xarelto for DVT in arm    Examination:   BP (!) 150/78   Pulse 68   Ht 5\' 2"  (1.575 m)   Wt  130 lb 12.8 oz (59.3 kg)   SpO2 97%   BMI 23.92 kg/m   Body mass index is 23.92 kg/m.     ASSESSMENT/ PLAN:   Diabetes type 2   Her blood sugars are fairly good with A1c 5.1 although much lower than expected for her home readings possibly because of her anemia  She is concerned that her fasting readings are slightly higher than usual Also she is having some tendency to getting edema, has had various cardiac problems recently Not able to exercise much lately  Recommendations: She can increase her metformin to 2 tablets twice a day and hold off on Actos for now She will call if she has higher blood sugars She will try to be consistent with increase walking or other physical activities and diet Does need to have relatively low fat meals including breakfast and consistently have protein at breakfast  Hypertension: Followed by cardiologist, systolic blood pressure appears to be variable Influenza vaccine given    Patient Instructions  Low fat bacon, less Carbs, more protein  Take 2 Metformin 2x daily and stop Actos  Check blood sugars on waking up  3/7 days   Also check blood sugars about 2 hours after a meal and do this after different meals by rotation  Recommended blood sugar levels on waking up is 90-130 and about 2 hours after meal is 130-160  Please bring your blood sugar monitor to each visit, thank you       [facility] HarborKUMAR,Flower Franko 06/05/2017, 1:06 PM     Subjective

## 2017-06-05 NOTE — Patient Instructions (Addendum)
Low fat bacon, less Carbs, more protein  Take 2 Metformin 2x daily and stop Actos  Check blood sugars on waking up  3/7 days   Also check blood sugars about 2 hours after a meal and do this after different meals by rotation  Recommended blood sugar levels on waking up is 90-130 and about 2 hours after meal is 130-160  Please bring your blood sugar monitor to each visit, thank you

## 2017-06-11 DIAGNOSIS — N182 Chronic kidney disease, stage 2 (mild): Secondary | ICD-10-CM | POA: Diagnosis not present

## 2017-06-11 DIAGNOSIS — D631 Anemia in chronic kidney disease: Secondary | ICD-10-CM | POA: Diagnosis not present

## 2017-06-11 DIAGNOSIS — N189 Chronic kidney disease, unspecified: Secondary | ICD-10-CM | POA: Diagnosis not present

## 2017-06-12 DIAGNOSIS — N182 Chronic kidney disease, stage 2 (mild): Secondary | ICD-10-CM | POA: Diagnosis not present

## 2017-06-12 DIAGNOSIS — D631 Anemia in chronic kidney disease: Secondary | ICD-10-CM | POA: Diagnosis not present

## 2017-07-09 DIAGNOSIS — H524 Presbyopia: Secondary | ICD-10-CM | POA: Diagnosis not present

## 2017-07-09 DIAGNOSIS — E119 Type 2 diabetes mellitus without complications: Secondary | ICD-10-CM | POA: Diagnosis not present

## 2017-07-09 LAB — HM DIABETES EYE EXAM

## 2017-07-13 DIAGNOSIS — N182 Chronic kidney disease, stage 2 (mild): Secondary | ICD-10-CM | POA: Diagnosis not present

## 2017-07-13 DIAGNOSIS — D631 Anemia in chronic kidney disease: Secondary | ICD-10-CM | POA: Diagnosis not present

## 2017-08-06 ENCOUNTER — Other Ambulatory Visit: Payer: Self-pay

## 2017-08-06 MED ORDER — METFORMIN HCL ER 500 MG PO TB24: 1000.0000 mg | ORAL_TABLET | Freq: Two times a day (BID) | ORAL | 0 refills | Status: DC

## 2017-08-13 DIAGNOSIS — N182 Chronic kidney disease, stage 2 (mild): Secondary | ICD-10-CM | POA: Diagnosis not present

## 2017-08-13 DIAGNOSIS — D631 Anemia in chronic kidney disease: Secondary | ICD-10-CM | POA: Diagnosis not present

## 2017-08-23 DIAGNOSIS — Z45018 Encounter for adjustment and management of other part of cardiac pacemaker: Secondary | ICD-10-CM | POA: Diagnosis not present

## 2017-08-23 DIAGNOSIS — I442 Atrioventricular block, complete: Secondary | ICD-10-CM | POA: Diagnosis not present

## 2017-08-23 DIAGNOSIS — Z515 Encounter for palliative care: Secondary | ICD-10-CM | POA: Insufficient documentation

## 2017-09-04 NOTE — Progress Notes (Signed)
Patient ID: Bridget [name], female   DOB: 09/02/1940, 77 y.o.   MRN: [id number]   Reason for Appointment: Diabetes follow-up   History of Present Illness   Diagnosis: Type 2 DIABETES MELITUS, date of diagnosis:  2007     Previous history: Her A1c at diagnosis was 7.9% She has been treated with a combination of metformin ER and Actos for several years Usually her A1c has been upper normal and her last A1c was 5.8 in 4/14  Recent history:  Her A1c has ranged between 5.1-6% and now it is 6%   She was concerned about her blood sugars being higher on the last visit but morning sugars were averaging only 127  Also she was complaining about swelling of her feet and legs and was taken off Actos  Although she was told to try taking as much is 2000 mg of metformin ER she has difficulty with this causing abdominal discomfort and is on her own taking 1-1/2 tablets twice a day with food  She is still having some abdominal discomfort in the upper part  Although she is trying to cut back on portions and recently since she thought her blood sugars were high being over 120 morning  Again she does not check her sugars after meals as directed   Fasting blood sugars are excellent now and she has a couple of good readings later on She is not able to do as much exercise that she can because of balance and other problems     Oral hypoglycemic drugs:   metformin ER 500 mg, 1 tablet 2x a day        Side effects from medications: None       Monitors blood glucose:  once a day.    Glucometer: One Touch.          Blood Glucose readings from monitor   Mean values apply above for all meters except median for One Touch  PRE-MEAL Fasting Lunch Dinner Bedtime Overall  Glucose range: 106-138       Mean/median: 120   107   119   POST-MEAL PC Breakfast PC Lunch PC Dinner  Glucose range:   96   Mean/median:          Meals: 3 meals per day. Usually follows a healthy diet, portions  controlled  Physical activity: exercise: a little walking             Dietician visit: Most recent: 2007            Wt Readings from Last 3 Encounters:  09/05/17 123 lb (55.8 kg)  06/05/17 130 lb 12.8 oz (59.3 kg)  01/18/17 126 lb (57.2 kg)   Lab Results  Component Value Date   HGBA1C 5.1 06/05/2017   HGBA1C 5.9 01/18/2017   HGBA1C 5.6 02/29/2016   Lab Results  Component Value Date   MICROALBUR <0.7 01/18/2017   LDLCALC 69 11/09/2016   CREATININE 1.24 (H) 01/18/2017     Allergies as of 09/05/2017      Reactions   Atorvastatin Other (See Comments)   "muscle Cramps"   Budesonide Other (See Comments)   "fatigue"   Gabapentin Other (See Comments)   "fatigue"   Metoclopramide Other (See Comments)   "mayalgia"   Rosuvastatin Other (See Comments)   "muscle cramps"   Simvastatin Other (See Comments)   "muscle Cramps"   Sulfamethoxazole-trimethoprim Rash      Medication List        Accurate  as of 09/05/17  9:23 AM. Always use your most recent med list.          amLODipine 5 MG tablet Commonly known as:  NORVASC Take 5 mg by mouth daily. Take 1/2 tablet daily   aspirin EC 81 MG tablet Take 81 mg by mouth daily.   carvedilol 25 MG tablet Commonly known as:  COREG Take 25 mg by mouth 2 (two) times daily with a meal.   fenofibrate 160 MG tablet Take 160 mg by mouth daily.   furosemide 20 MG tablet Commonly known as:  LASIX Take 20 mg by mouth daily.   glucose blood test strip Commonly known as:  ONE TOUCH ULTRA TEST USE ONE STRIP TO CHECK GLUCOSE THREE TIMES DAILY- Dx code E11.9   Magnesium 250 MG Tabs Take 250 mg by mouth daily.   metFORMIN 500 MG 24 hr tablet Commonly known as:  GLUCOPHAGE-XR Take 2 tablets (1,000 mg total) by mouth 2 (two) times daily.   MULTI VITAMIN DAILY PO Take by mouth daily.   nitroGLYCERIN 0.4 MG SL tablet Commonly known as:  NITROSTAT Place 0.4 mg under the tongue every 5 (five) minutes as needed for chest pain.     ONETOUCH DELICA LANCETS 33G Misc USE TO CHECK GLUCOSE 2-3 DAILY, Dx Code E11.9   pioglitazone 30 MG tablet Commonly known as:  ACTOS TAKE ONE TABLET BY MOUTH ONCE DAILY   polyethylene glycol packet Commonly known as:  MIRALAX / GLYCOLAX Take 17 g by mouth daily as needed.   PROAIR HFA 108 (90 Base) MCG/ACT inhaler Generic drug:  albuterol Inhale into the lungs every 4 (four) hours as needed for wheezing or shortness of breath.   Rivaroxaban 15 MG Tabs tablet Commonly known as:  XARELTO Take 15 mg by mouth daily.   sertraline 100 MG tablet Commonly known as:  ZOLOFT Reported on 09/01/2015   venlafaxine 75 MG tablet Commonly known as:  EFFEXOR Take 75 mg by mouth daily.   Vitamin D-3 1000 units Caps Take 1,000 Units by mouth daily.       Allergies:  Allergies  Allergen Reactions  . Atorvastatin Other (See Comments)    "muscle Cramps"  . Budesonide Other (See Comments)    "fatigue"  . Gabapentin Other (See Comments)    "fatigue"  . Metoclopramide Other (See Comments)    "mayalgia"  . Rosuvastatin Other (See Comments)    "muscle cramps"  . Simvastatin Other (See Comments)    "muscle Cramps"  . Sulfamethoxazole-Trimethoprim Rash    Past Medical History:  Diagnosis Date  . Diabetes mellitus without complication (HCC)   . Thyroid disease     History reviewed. No pertinent surgical history.  History reviewed. No pertinent family history.  Social History:  reports that  has never smoked. she has never used smokeless tobacco. Her alcohol and drug histories are not on file.  Review of Systems:    Hypertension:  blood pressure is treated with Coreg, amlodipine and Zestoretic, followed by PCP and cardiologist Home BP Recently 140-158 and she is going to follow-up with her nephrologist  Lipids: She has had hypertriglyceridemia controlled with fenofibrate: Last lipid panel in March 2018 showed normal levels    Lab Results  Component Value Date   CHOL 174  06/01/2015   HDL 52.10 06/01/2015   LDLCALC 69 11/09/2016   LDLDIRECT 94.2 12/05/2013   TRIG 140.0 06/01/2015   CHOLHDL 3 06/01/2015     THYROID:  She was previously on thyroid  suppression because of her long-standing benign thyroid nodule.  She has had 2 biopsies done on this previously Because of low normal TSH her 50 mcg Synthroid was stopped on the visit in 10/14 TSH normal subsequently  Her ultrasound last showed mostly cystic nodule in the right side about 1.6 cm  Lab Results  Component Value Date   TSH 1.70 01/18/2017   TSH 0.74 02/29/2016   TSH 1.33 06/01/2015       Examination:   BP (!) 148/70   Pulse 85   Ht 5\' 2"  (1.575 m)   Wt 123 lb (55.8 kg)   SpO2 96%   BMI 22.50 kg/m   Body mass index is 22.5 kg/m.   Right lobe the thyroid is just palpable in the lower part on swallowing, smooth and fleshy Has a 1.5 cm upper lobe nodule on the right side, smooth and firm Left side not palpable No lymphadenopathy  ASSESSMENT/ PLAN:   Diabetes type 2   Her blood sugars are fairly good with A1c 6%  A1c result is variable because of her anemia and renal dysfunction at times  Blood sugars at home are excellent and fasting blood sugars are improved from before with increasing her metformin However since she is having GI distress from the higher doses of metformin may not be able to tolerate 1500 mg Reassured her that her blood sugars adequately minute they are in the 120s at times fasting as well as A1c is low 6.5  Recommendations: She can reduce her metformin to 500 mg twice a day She needs to not cut the tablets in half since they are extended release No need to restart Actos Encourage her to be more active To check more readings after meals also  Hypertension: Followed by cardiologist and nephrologist, systolic blood pressure appears to be high  LIPIDS: Will have these checked today again   History of thyroid nodule: Stable by exam, to check TSH  today  Patient Instructions  Take Metformin 1 am and 1 pm with food  Check blood sugars on waking up  2-3/7  Also check blood sugars about 2 hours after a meal and do this after different meals by rotation  Recommended blood sugar levels on waking up is 90-130 and about 2 hours after meal is 130-160  Please bring your blood sugar monitor to each visit, thank you     Reather Littler 09/05/2017, 9:23 AM     Subjective

## 2017-09-05 ENCOUNTER — Encounter: Payer: Self-pay | Admitting: Endocrinology

## 2017-09-05 ENCOUNTER — Ambulatory Visit (INDEPENDENT_AMBULATORY_CARE_PROVIDER_SITE_OTHER): Payer: Medicare Other | Admitting: Endocrinology

## 2017-09-05 VITALS — BP 148/70 | HR 85 | Ht 62.0 in | Wt 123.0 lb

## 2017-09-05 DIAGNOSIS — E1165 Type 2 diabetes mellitus with hyperglycemia: Secondary | ICD-10-CM | POA: Diagnosis not present

## 2017-09-05 DIAGNOSIS — E041 Nontoxic single thyroid nodule: Secondary | ICD-10-CM

## 2017-09-05 LAB — COMPREHENSIVE METABOLIC PANEL
ALT: 21 U/L (ref 0–35)
AST: 38 U/L — AB (ref 0–37)
Albumin: 3.8 g/dL (ref 3.5–5.2)
Alkaline Phosphatase: 64 U/L (ref 39–117)
BUN: 24 mg/dL — AB (ref 6–23)
CHLORIDE: 106 meq/L (ref 96–112)
CO2: 22 meq/L (ref 19–32)
CREATININE: 0.98 mg/dL (ref 0.40–1.20)
Calcium: 9 mg/dL (ref 8.4–10.5)
GFR: 58.89 mL/min — ABNORMAL LOW (ref >60.00)
GLUCOSE: 99 mg/dL (ref 70–99)
POTASSIUM: 4.1 meq/L (ref 3.5–5.1)
SODIUM: 142 meq/L (ref 135–145)
Total Bilirubin: 0.5 mg/dL (ref 0.2–1.2)
Total Protein: 6.4 g/dL (ref 6.0–8.3)

## 2017-09-05 LAB — POCT GLYCOSYLATED HEMOGLOBIN (HGB A1C): HEMOGLOBIN A1C: 6

## 2017-09-05 LAB — LIPID PANEL
CHOL/HDL RATIO: 2
CHOLESTEROL: 138 mg/dL (ref 0–200)
HDL: 59.8 mg/dL (ref >39.00)
LDL CALC: 57 mg/dL (ref 0–99)
NONHDL: 78.3
Triglycerides: 106 mg/dL (ref 0.0–149.0)
VLDL: 21.2 mg/dL (ref 0.0–40.0)

## 2017-09-05 LAB — T4, FREE: Free T4: 1.05 ng/dL (ref 0.60–1.60)

## 2017-09-05 LAB — TSH: TSH: 1.26 u[IU]/mL (ref 0.35–4.50)

## 2017-09-05 NOTE — Progress Notes (Signed)
Please call to let patient know that the lab results are normal and no further action needed

## 2017-09-05 NOTE — Patient Instructions (Addendum)
Take Metformin 1 am and 1 pm with food  Check blood sugars on waking up  2-3/7  Also check blood sugars about 2 hours after a meal and do this after different meals by rotation  Recommended blood sugar levels on waking up is 90-130 and about 2 hours after meal is 130-160  Please bring your blood sugar monitor to each visit, thank you

## 2017-09-05 NOTE — Addendum Note (Signed)
Addended by: Bobbye Riggs on: 09/05/2017 12:20 PM   Modules accepted: Orders

## 2017-09-06 ENCOUNTER — Telehealth: Payer: Self-pay | Admitting: Endocrinology

## 2017-09-06 NOTE — Telephone Encounter (Signed)
Patient is returning your call.  

## 2017-09-12 DIAGNOSIS — N182 Chronic kidney disease, stage 2 (mild): Secondary | ICD-10-CM | POA: Diagnosis not present

## 2017-09-12 DIAGNOSIS — D631 Anemia in chronic kidney disease: Secondary | ICD-10-CM | POA: Diagnosis not present

## 2017-09-12 DIAGNOSIS — N189 Chronic kidney disease, unspecified: Secondary | ICD-10-CM | POA: Diagnosis not present

## 2017-09-20 NOTE — Telephone Encounter (Signed)
I do not see a phone message that someone from our office called this patient but I do see a message in the lab results where Cyrstal Leitz B was able to contact this patient and go over lab results.

## 2017-10-01 ENCOUNTER — Other Ambulatory Visit: Payer: Self-pay | Admitting: Endocrinology

## 2017-10-08 ENCOUNTER — Telehealth: Payer: Self-pay | Admitting: Endocrinology

## 2017-10-08 NOTE — Telephone Encounter (Signed)
Patient started that she need a refill of Lancet for one touch meter  [facility] - [place], [place] - [address] [place]

## 2017-10-09 NOTE — Telephone Encounter (Signed)
Refill was sent. Pharmacy receipt confirmed on 10/01/17. I called [facility] and left a detailed mess for them to contact pt when 10/01/17 was filled/ready for p/u.

## 2017-10-10 ENCOUNTER — Other Ambulatory Visit: Payer: Self-pay

## 2017-10-10 MED ORDER — ONETOUCH DELICA LANCETS 33G MISC: 1 refills | Status: DC

## 2017-10-11 DIAGNOSIS — N182 Chronic kidney disease, stage 2 (mild): Secondary | ICD-10-CM | POA: Diagnosis not present

## 2017-10-11 DIAGNOSIS — D631 Anemia in chronic kidney disease: Secondary | ICD-10-CM | POA: Diagnosis not present

## 2017-10-12 ENCOUNTER — Other Ambulatory Visit: Payer: Self-pay

## 2017-10-12 ENCOUNTER — Telehealth: Payer: Self-pay | Admitting: Endocrinology

## 2017-10-12 MED ORDER — ONETOUCH DELICA LANCETS 33G MISC: 1 refills | Status: DC

## 2017-10-12 NOTE — Telephone Encounter (Signed)
ONETOUCH DELICA LANCETS 33G MISC   Needs this prescription sent in.  She states he needs to stay how many times a days she needs to check and need to have the DX code also     [facility] - [place], [place] - [address]

## 2017-10-12 NOTE — Telephone Encounter (Signed)
I have sent the [facility] to the [facility] in [place] testing 3 times daily with the diagnosis code in both locations.

## 2017-10-12 NOTE — Telephone Encounter (Signed)
Patient need refill of medication metformin 2 x a day, Not 2 to 3 times a day.  They will only fill one or the other.  Patient want 2 x a day, and the Dx code  Patient has been out of medication for a week.  [facility] [address] [place] - [address] [place]

## 2017-10-15 DIAGNOSIS — I471 Supraventricular tachycardia: Secondary | ICD-10-CM | POA: Diagnosis not present

## 2017-10-15 DIAGNOSIS — Z45018 Encounter for adjustment and management of other part of cardiac pacemaker: Secondary | ICD-10-CM | POA: Diagnosis not present

## 2017-10-15 DIAGNOSIS — I82A12 Acute embolism and thrombosis of left axillary vein: Secondary | ICD-10-CM | POA: Diagnosis not present

## 2017-10-15 DIAGNOSIS — I5032 Chronic diastolic (congestive) heart failure: Secondary | ICD-10-CM | POA: Diagnosis not present

## 2017-10-15 DIAGNOSIS — I442 Atrioventricular block, complete: Secondary | ICD-10-CM | POA: Diagnosis not present

## 2017-10-16 DIAGNOSIS — Z1231 Encounter for screening mammogram for malignant neoplasm of breast: Secondary | ICD-10-CM | POA: Diagnosis not present

## 2017-10-19 DIAGNOSIS — D641 Secondary sideroblastic anemia due to disease: Secondary | ICD-10-CM | POA: Diagnosis not present

## 2017-10-19 DIAGNOSIS — I442 Atrioventricular block, complete: Secondary | ICD-10-CM | POA: Diagnosis not present

## 2017-10-19 DIAGNOSIS — E782 Mixed hyperlipidemia: Secondary | ICD-10-CM | POA: Diagnosis not present

## 2017-10-19 DIAGNOSIS — E1121 Type 2 diabetes mellitus with diabetic nephropathy: Secondary | ICD-10-CM | POA: Diagnosis not present

## 2017-10-19 DIAGNOSIS — F321 Major depressive disorder, single episode, moderate: Secondary | ICD-10-CM | POA: Diagnosis not present

## 2017-10-19 DIAGNOSIS — R143 Flatulence: Secondary | ICD-10-CM | POA: Diagnosis not present

## 2017-10-19 DIAGNOSIS — I119 Hypertensive heart disease without heart failure: Secondary | ICD-10-CM | POA: Diagnosis not present

## 2017-10-19 DIAGNOSIS — J4520 Mild intermittent asthma, uncomplicated: Secondary | ICD-10-CM | POA: Diagnosis not present

## 2017-11-01 DIAGNOSIS — M79642 Pain in left hand: Secondary | ICD-10-CM | POA: Diagnosis not present

## 2017-11-01 DIAGNOSIS — S62315A Displaced fracture of base of fourth metacarpal bone, left hand, initial encounter for closed fracture: Secondary | ICD-10-CM | POA: Diagnosis not present

## 2017-11-01 DIAGNOSIS — M80042A Age-related osteoporosis with current pathological fracture, left hand, initial encounter for fracture: Secondary | ICD-10-CM | POA: Diagnosis not present

## 2017-11-01 DIAGNOSIS — R1312 Dysphagia, oropharyngeal phase: Secondary | ICD-10-CM | POA: Diagnosis not present

## 2017-11-01 DIAGNOSIS — S62397A Other fracture of fifth metacarpal bone, left hand, initial encounter for closed fracture: Secondary | ICD-10-CM | POA: Diagnosis not present

## 2017-11-01 DIAGNOSIS — Z0001 Encounter for general adult medical examination with abnormal findings: Secondary | ICD-10-CM | POA: Diagnosis not present

## 2017-11-01 DIAGNOSIS — M25532 Pain in left wrist: Secondary | ICD-10-CM | POA: Diagnosis not present

## 2017-11-06 DIAGNOSIS — Z9181 History of falling: Secondary | ICD-10-CM | POA: Diagnosis not present

## 2017-11-06 DIAGNOSIS — J90 Pleural effusion, not elsewhere classified: Secondary | ICD-10-CM | POA: Diagnosis not present

## 2017-11-06 DIAGNOSIS — I517 Cardiomegaly: Secondary | ICD-10-CM | POA: Diagnosis not present

## 2017-11-06 DIAGNOSIS — R079 Chest pain, unspecified: Secondary | ICD-10-CM | POA: Diagnosis not present

## 2017-11-07 ENCOUNTER — Other Ambulatory Visit: Payer: Self-pay | Admitting: Endocrinology

## 2017-11-09 DIAGNOSIS — N182 Chronic kidney disease, stage 2 (mild): Secondary | ICD-10-CM | POA: Diagnosis not present

## 2017-11-09 DIAGNOSIS — D631 Anemia in chronic kidney disease: Secondary | ICD-10-CM | POA: Diagnosis not present

## 2017-11-12 ENCOUNTER — Telehealth: Payer: Self-pay | Admitting: Endocrinology

## 2017-11-12 NOTE — Telephone Encounter (Signed)
Patient Husband stated pharmacy haven't received her  glucose blood (ONE TOUCH ULTRA TEST) test strip [[id number]]  She has been out for a week now.  Send to  Pharmacy:  [facility] [address], [place] - [address] [place] #:  --

## 2017-11-13 MED ORDER — GLUCOSE BLOOD VI STRP: ORAL_STRIP | 3 refills | Status: DC

## 2017-11-13 NOTE — Telephone Encounter (Signed)
Rx printed and given to pt's husband.

## 2017-11-23 DIAGNOSIS — S62347A Nondisplaced fracture of base of fifth metacarpal bone. left hand, initial encounter for closed fracture: Secondary | ICD-10-CM | POA: Diagnosis not present

## 2017-12-11 DIAGNOSIS — D631 Anemia in chronic kidney disease: Secondary | ICD-10-CM | POA: Diagnosis not present

## 2017-12-11 DIAGNOSIS — N189 Chronic kidney disease, unspecified: Secondary | ICD-10-CM | POA: Diagnosis not present

## 2017-12-11 DIAGNOSIS — N182 Chronic kidney disease, stage 2 (mild): Secondary | ICD-10-CM | POA: Diagnosis not present

## 2018-01-01 ENCOUNTER — Other Ambulatory Visit: Payer: Self-pay

## 2018-01-01 ENCOUNTER — Ambulatory Visit (INDEPENDENT_AMBULATORY_CARE_PROVIDER_SITE_OTHER): Payer: Medicare Other | Admitting: Endocrinology

## 2018-01-01 ENCOUNTER — Encounter: Payer: Self-pay | Admitting: Endocrinology

## 2018-01-01 VITALS — BP 130/64 | HR 64 | Ht 60.0 in | Wt 119.0 lb

## 2018-01-01 DIAGNOSIS — E1165 Type 2 diabetes mellitus with hyperglycemia: Secondary | ICD-10-CM | POA: Diagnosis not present

## 2018-01-01 LAB — BASIC METABOLIC PANEL
BUN: 29 mg/dL — AB (ref 6–23)
CALCIUM: 9 mg/dL (ref 8.4–10.5)
CO2: 26 mEq/L (ref 19–32)
Chloride: 104 mEq/L (ref 96–112)
Creatinine, Ser: 1.13 mg/dL (ref 0.40–1.20)
GFR: 49.92 mL/min — AB (ref >60.00)
GLUCOSE: 82 mg/dL (ref 70–99)
Potassium: 4.1 mEq/L (ref 3.5–5.1)
SODIUM: 137 meq/L (ref 135–145)

## 2018-01-01 LAB — MICROALBUMIN / CREATININE URINE RATIO
Creatinine,U: 127.5 mg/dL
Microalb Creat Ratio: 0.5 mg/g (ref 0.0–30.0)

## 2018-01-01 MED ORDER — GLUCOSE BLOOD VI STRP: ORAL_STRIP | 3 refills | Status: DC

## 2018-01-01 NOTE — Progress Notes (Signed)
Patient ID: Bridget [name], female   DOB: 09-Feb-1941, 77 y.o.   MRN: [id number]   Reason for Appointment: Diabetes follow-up   History of Present Illness   Diagnosis: Type 2 DIABETES MELITUS, date of diagnosis:  2007     Previous history: Her A1c at diagnosis was 7.9% She has been treated with a combination of metformin ER and Actos for several years Usually her A1c has been upper normal and her last A1c was 5.8 in 4/14  Recent history:  Her A1c has ranged between 5.1-6% and today it is 5.8%   She has only been on metformin 500 mg twice daily since she has abdominal discomfort with higher doses  Previously had been on Actos also but this was stopped because of leg edema which has resolved  She appears to be progressively losing weight and she thinks this is because of her efforts to control her diet and trying to be more active  She does have random blood sugars as high as 231 although not doing readings after meals consistently and none after supper recently  FASTING readings are overall mildly increased and relatively stable She is still concerned about some high readings at home However she is reluctant to take any brand-name medication because of cost     Oral hypoglycemic drugs:   metformin ER 500 mg, 1 tablet 2x a day         Side effects from medications: None       Monitors blood glucose:  once a day.    Glucometer: One Touch.          Blood Glucose readings from monitor   Mean values apply above for all meters except median for One Touch  PRE-MEAL Fasting Lunch Dinner Bedtime Overall  Glucose range:  111-163  130-177    99-231  Mean/median:  125    130   POST-MEAL PC Breakfast PC Lunch PC Dinner  Glucose range:    122, 231  Mean/median:      Previously readings:   PRE-MEAL Fasting Lunch Dinner Bedtime Overall  Glucose range: 106-138       Mean/median: 120   107   119   POST-MEAL PC Breakfast PC Lunch PC Dinner  Glucose range:   96     Mean/median:          Meals: 3 meals per day. Usually follows a healthy diet, portions controlled  Physical activity: exercise:  Walking 10-15 min             Dietician visit: Most recent: 2007            Wt Readings from Last 3 Encounters:  01/01/18 119 lb (54 kg)  09/05/17 123 lb (55.8 kg)  06/05/17 130 lb 12.8 oz (59.3 kg)   Lab Results  Component Value Date   HGBA1C 5.8 (A) 01/02/2018   HGBA1C 6.0 09/05/2017   HGBA1C 5.1 06/05/2017   Lab Results  Component Value Date   MICROALBUR <0.7 01/01/2018   LDLCALC 57 09/05/2017   CREATININE 1.13 01/01/2018     Allergies as of 01/01/2018      Reactions   Atorvastatin Other (See Comments)   "muscle Cramps"   Budesonide Other (See Comments)   "fatigue"   Gabapentin Other (See Comments)   "fatigue"   Metoclopramide Other (See Comments)   "mayalgia"   Rosuvastatin Other (See Comments)   "muscle cramps"   Simvastatin Other (See Comments)   "muscle Cramps"   Sulfamethoxazole-trimethoprim  Rash      Medication List        Accurate as of 01/01/18 11:59 PM. Always use your most recent med list.          amLODipine 5 MG tablet Commonly known as:  NORVASC Take 5 mg by mouth daily. Take 1/2 tablet daily   aspirin EC 81 MG tablet Take 81 mg by mouth daily.   carvedilol 25 MG tablet Commonly known as:  COREG Take 25 mg by mouth 2 (two) times daily with a meal.   fenofibrate 160 MG tablet Take 160 mg by mouth daily.   furosemide 20 MG tablet Commonly known as:  LASIX Take 20 mg by mouth daily.   glucose blood test strip Commonly known as:  ONE TOUCH ULTRA TEST USE 1 STRIP TO CHECK GLUCOSE THREE TIMES DAILY   Magnesium 250 MG Tabs Take 250 mg by mouth daily.   metFORMIN 500 MG 24 hr tablet Commonly known as:  GLUCOPHAGE-XR Take 2 tablets (1,000 mg total) by mouth 2 (two) times daily.   MULTI VITAMIN DAILY PO Take by mouth daily.   nitroGLYCERIN 0.4 MG SL tablet Commonly known as:  NITROSTAT Place 0.4 mg  under the tongue every 5 (five) minutes as needed for chest pain.   ONETOUCH DELICA LANCETS 33G Misc USE 1 LANCET TO CHECK GLUCOSE 3 TIMES DAILY. Dx Code E11.9   polyethylene glycol packet Commonly known as:  MIRALAX / GLYCOLAX Take 17 g by mouth daily as needed.   Rivaroxaban 15 MG Tabs tablet Commonly known as:  XARELTO Take 15 mg by mouth daily.   venlafaxine 75 MG tablet Commonly known as:  EFFEXOR Take 75 mg by mouth daily.   Vitamin D-3 1000 units Caps Take 1,000 Units by mouth daily.       Allergies:  Allergies  Allergen Reactions  . Atorvastatin Other (See Comments)    "muscle Cramps"  . Budesonide Other (See Comments)    "fatigue"  . Gabapentin Other (See Comments)    "fatigue"  . Metoclopramide Other (See Comments)    "mayalgia"  . Rosuvastatin Other (See Comments)    "muscle cramps"  . Simvastatin Other (See Comments)    "muscle Cramps"  . Sulfamethoxazole-Trimethoprim Rash    Past Medical History:  Diagnosis Date  . Diabetes mellitus without complication (HCC)   . Thyroid disease     History reviewed. No pertinent surgical history.  History reviewed. No pertinent family history.  Social History:  reports that she has never smoked. She has never used smokeless tobacco. Her alcohol and drug histories are not on file.  Review of Systems:  Hypertension:  blood pressure is treated with Coreg, amlodipine and Zestoretic, followed by PCP and cardiologist Home BP checked periodically  Lipids: She has had hypertriglyceridemia controlled with fenofibrate These are well controlled now    Lab Results  Component Value Date   CHOL 138 09/05/2017   HDL 59.80 09/05/2017   LDLCALC 57 09/05/2017   LDLDIRECT 94.2 12/05/2013   TRIG 106.0 09/05/2017   CHOLHDL 2 09/05/2017     THYROID:  She was previously on thyroid suppression because of her long-standing benign thyroid nodule.  She has had 2 biopsies done on this previously Because of low normal TSH  her 50 mcg Synthroid was stopped on the visit in 10/14 TSH normal subsequently  Her ultrasound last showed mostly cystic nodule in the right side about 1.6 cm  Lab Results  Component Value Date   TSH  1.26 09/05/2017   TSH 1.70 01/18/2017   TSH 0.74 02/29/2016       Examination:   BP 130/64 (BP Location: Left Arm, Patient Position: Sitting, Cuff Size: Normal)   Pulse 64   Ht 5' (1.524 m)   Wt 119 lb (54 kg)   SpO2 97%   BMI 23.24 kg/m   Body mass index is 23.24 kg/m.    Has a 1.5 cm upper lobe thyroid nodule on the right side, smooth and firm Bicep reflexes normal  Diabetic Foot Exam - Simple   Simple Foot Form Diabetic Foot exam was performed with the following findings:  Yes   Visual Inspection No deformities, no ulcerations, no other skin breakdown bilaterally:  Yes Sensation Testing Intact to touch and monofilament testing bilaterally:  Yes Pulse Check Posterior Tibialis and Dorsalis pulse intact bilaterally:  Yes See comments:  Yes Comments Pedal pulses decreased especially dorsalis pedis      ASSESSMENT/ PLAN:   Diabetes type 2   Her blood sugars are fairly good with A1c 5.8 %  Currently being managed with metformin only, 1000 mg a day  See history of present illness for detailed discussion of current diabetes management, blood sugar patterns and problems identified  She does have occasional high postprandial readings but not taking these very much Fasting readings are mildly increased overall  Discussed that since her A1c is fairly good and she is reluctant to try any brand-name medication such as Januvia will not change her regimen Also had edema with Actos   Hypertension: Followed by cardiologist and nephrologist  LIPIDS: Will have these checked with her PCP again   History of thyroid nodule: Stable by exam  There are no Patient Instructions on file for this visit.   Reather Littler 01/02/2018, 12:04 PM

## 2018-01-02 LAB — POCT GLYCOSYLATED HEMOGLOBIN (HGB A1C): Hemoglobin A1C: 5.8 % — AB (ref 4.0–5.6)

## 2018-01-03 ENCOUNTER — Ambulatory Visit: Payer: Medicare Other | Admitting: Endocrinology

## 2018-01-15 DIAGNOSIS — L570 Actinic keratosis: Secondary | ICD-10-CM | POA: Diagnosis not present

## 2018-02-01 ENCOUNTER — Other Ambulatory Visit: Payer: Self-pay | Admitting: Endocrinology

## 2018-02-04 ENCOUNTER — Other Ambulatory Visit: Payer: Self-pay

## 2018-02-04 MED ORDER — METFORMIN HCL ER 500 MG PO TB24: ORAL_TABLET | ORAL | 3 refills | Status: DC

## 2018-02-25 DIAGNOSIS — I442 Atrioventricular block, complete: Secondary | ICD-10-CM | POA: Diagnosis not present

## 2018-02-25 DIAGNOSIS — I471 Supraventricular tachycardia: Secondary | ICD-10-CM | POA: Diagnosis not present

## 2018-02-25 DIAGNOSIS — Z45018 Encounter for adjustment and management of other part of cardiac pacemaker: Secondary | ICD-10-CM | POA: Diagnosis not present

## 2018-02-25 DIAGNOSIS — I5032 Chronic diastolic (congestive) heart failure: Secondary | ICD-10-CM | POA: Diagnosis not present

## 2018-03-04 DIAGNOSIS — S51812A Laceration without foreign body of left forearm, initial encounter: Secondary | ICD-10-CM | POA: Diagnosis not present

## 2018-03-19 DIAGNOSIS — R55 Syncope and collapse: Secondary | ICD-10-CM | POA: Diagnosis not present

## 2018-03-19 DIAGNOSIS — R110 Nausea: Secondary | ICD-10-CM | POA: Diagnosis not present

## 2018-03-19 DIAGNOSIS — R0602 Shortness of breath: Secondary | ICD-10-CM | POA: Diagnosis not present

## 2018-03-19 DIAGNOSIS — R42 Dizziness and giddiness: Secondary | ICD-10-CM | POA: Diagnosis not present

## 2018-03-19 DIAGNOSIS — R069 Unspecified abnormalities of breathing: Secondary | ICD-10-CM | POA: Diagnosis not present

## 2018-03-19 DIAGNOSIS — I10 Essential (primary) hypertension: Secondary | ICD-10-CM | POA: Diagnosis not present

## 2018-03-19 DIAGNOSIS — R61 Generalized hyperhidrosis: Secondary | ICD-10-CM | POA: Diagnosis not present

## 2018-03-19 DIAGNOSIS — R531 Weakness: Secondary | ICD-10-CM | POA: Diagnosis not present

## 2018-04-12 DIAGNOSIS — N189 Chronic kidney disease, unspecified: Secondary | ICD-10-CM | POA: Diagnosis not present

## 2018-04-12 DIAGNOSIS — N182 Chronic kidney disease, stage 2 (mild): Secondary | ICD-10-CM | POA: Diagnosis not present

## 2018-04-12 DIAGNOSIS — D631 Anemia in chronic kidney disease: Secondary | ICD-10-CM | POA: Diagnosis not present

## 2018-05-06 DIAGNOSIS — G9389 Other specified disorders of brain: Secondary | ICD-10-CM | POA: Diagnosis not present

## 2018-05-06 DIAGNOSIS — J4520 Mild intermittent asthma, uncomplicated: Secondary | ICD-10-CM | POA: Diagnosis not present

## 2018-05-06 DIAGNOSIS — D641 Secondary sideroblastic anemia due to disease: Secondary | ICD-10-CM | POA: Diagnosis not present

## 2018-05-06 DIAGNOSIS — S0083XA Contusion of other part of head, initial encounter: Secondary | ICD-10-CM | POA: Diagnosis not present

## 2018-05-06 DIAGNOSIS — I119 Hypertensive heart disease without heart failure: Secondary | ICD-10-CM | POA: Diagnosis not present

## 2018-05-06 DIAGNOSIS — E782 Mixed hyperlipidemia: Secondary | ICD-10-CM | POA: Diagnosis not present

## 2018-05-06 DIAGNOSIS — G458 Other transient cerebral ischemic attacks and related syndromes: Secondary | ICD-10-CM | POA: Diagnosis not present

## 2018-05-06 DIAGNOSIS — E1169 Type 2 diabetes mellitus with other specified complication: Secondary | ICD-10-CM | POA: Diagnosis not present

## 2018-05-06 DIAGNOSIS — E1121 Type 2 diabetes mellitus with diabetic nephropathy: Secondary | ICD-10-CM | POA: Diagnosis not present

## 2018-05-06 DIAGNOSIS — I10 Essential (primary) hypertension: Secondary | ICD-10-CM | POA: Diagnosis not present

## 2018-05-06 DIAGNOSIS — I6523 Occlusion and stenosis of bilateral carotid arteries: Secondary | ICD-10-CM | POA: Diagnosis not present

## 2018-05-06 DIAGNOSIS — F330 Major depressive disorder, recurrent, mild: Secondary | ICD-10-CM | POA: Diagnosis not present

## 2018-05-06 LAB — LIPID PANEL
CHOLESTEROL: 147 (ref 0–200)
HDL: 54 (ref 35–70)
LDL Cholesterol: 68
TRIGLYCERIDES: 124 (ref 40–160)

## 2018-05-06 LAB — HEMOGLOBIN A1C: HEMOGLOBIN A1C: 6 % — AB (ref 4.0–5.6)

## 2018-05-13 DIAGNOSIS — G458 Other transient cerebral ischemic attacks and related syndromes: Secondary | ICD-10-CM | POA: Diagnosis not present

## 2018-05-13 DIAGNOSIS — S0990XA Unspecified injury of head, initial encounter: Secondary | ICD-10-CM | POA: Diagnosis not present

## 2018-05-13 DIAGNOSIS — I6523 Occlusion and stenosis of bilateral carotid arteries: Secondary | ICD-10-CM | POA: Diagnosis not present

## 2018-05-13 DIAGNOSIS — R51 Headache: Secondary | ICD-10-CM | POA: Diagnosis not present

## 2018-05-15 ENCOUNTER — Telehealth: Payer: Self-pay | Admitting: Neurology

## 2018-05-15 NOTE — Telephone Encounter (Signed)
Dr. Sedalia Muta called about the patient, she had an event of clumsiness of the left arm and some difficulty with speech.  She has a pacemaker in place, CT scan of the head shows multiple chronic episodes of encephalomalacia and cerebellar infarcts.  She is on chronic anticoagulation therapy for a thrombotic event, not for atrial fibrillation.  CT angiogram was done showing tortuosity of the carotid vessels, no significant stenosis was seen.  The patient will be set up to be seen as a new patient, the CT and CT angiogram studies were done in [place].

## 2018-05-17 DIAGNOSIS — I517 Cardiomegaly: Secondary | ICD-10-CM | POA: Diagnosis not present

## 2018-05-17 DIAGNOSIS — I340 Nonrheumatic mitral (valve) insufficiency: Secondary | ICD-10-CM | POA: Diagnosis not present

## 2018-05-24 ENCOUNTER — Encounter: Payer: Self-pay | Admitting: *Deleted

## 2018-05-28 ENCOUNTER — Encounter: Payer: Self-pay | Admitting: Neurology

## 2018-05-28 ENCOUNTER — Ambulatory Visit (INDEPENDENT_AMBULATORY_CARE_PROVIDER_SITE_OTHER): Payer: Medicare Other | Admitting: Neurology

## 2018-05-28 VITALS — BP 138/73 | HR 67 | Ht 62.0 in | Wt 121.0 lb

## 2018-05-28 DIAGNOSIS — G4733 Obstructive sleep apnea (adult) (pediatric): Secondary | ICD-10-CM

## 2018-05-28 DIAGNOSIS — G459 Transient cerebral ischemic attack, unspecified: Secondary | ICD-10-CM

## 2018-05-28 NOTE — Progress Notes (Signed)
[facility]    Provider:  Dr Lucia Gaskins Referring Provider: Blane Ohara, MD Primary Care Physician:  Blane Ohara, MD  CC: Clumsiness left arm and speech difficulty: TIA  HPI:  Bridget [name] is a 77 y.o. female here as requested by Dr. Sedalia Muta for clumsy left arm and speech difficulty. 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  She is on aspirin 81 mg, Xarelto 15 mg. She is on anti-coagulation for a thrombotic event not for afib. She was sitting and reading 5-6 weeks ago the right arm and right leg felt in slow motion, she tried to touch her nose and couldn't, she lost feeling in the right leg, she kept trying to get the recliner down. Husband provides much information. Lasted 5-7 minutes.  Husband did not notice any aphasia or dysarthria. 2 days later on a Monday went to the doctor, she was never admitted into the hospital. She started the Xarelto in 09/2016. They have discussed eventually taking her off of the Xarelto. She has had trauma hit the right occipital area very hard in 2015. Also hit head left forehead hit head almost broke her nose. She has also hit her head on the right near the temple broke glasses.     Reviewed notes, labs and imaging from outside physicians, which showed:   HgbA1c 6 LDL 68   Reviewed referring physician notes Dr. Sedalia Muta.  Patient is on Xarelto and had a TIA when she was last seen 2 days prior.  they ordered a CT of the brain without contrast to rule out subdural or intracranial hematoma as patient hit her head.  A thorough stroke work-up was also performed.  Personally reviewed images and agree with the following CT and MRI/MRA results:   On CT showed a remote  right cerebellar infarct with encephalomalacia in the right cerebellum.  Otherwise CT of the head unremarkable.  MRI of the brain and MRI of the head and neck showed no acute intracranial abnormality but chronic infarcts in the right PICA anterior right MCA, left lenticulostriate and posterior left MCV vascular territories which are new since 2013 MRI.  No hemodynamically significant stenosis identified in the head or neck by MRA but a beaded appearance of the cervical left ICA and both distal cervical vertebral arteries is suspicious for fibromuscular dysplasia.     Reviewed echocardiogram report: An echocardiogram showed left ventricle cavity normal in size, mild decrease in global wall motion EF 45 to 50%.  No thrombus seen.  No PFO seen.  Moderately dilated left atrium.  Mild aortic stenosis with mild regurgitation.  Mild to moderate mitral regurgitation.  Trace tricuspid regurgitation.  Pacer wire noted.  Trivial pericardial effusion.  Reviewed carotid artery ultrasound report which showed minimal bilateral carotid atherosclerotic vascular disease, no flow-limiting stenosis, degree of stenosis less than 50%.  Vertebral arteries are patent with antegrade flow.  Patient has a Archivist which has been interrogated she is not in A. fib but she is on Xarelto 15 mg.  Review of Systems: Patient complains of symptoms per HPI as well as the following symptoms: Memory loss, headache, numbness, weakness, slurred speech, difficulty swallowing, dizziness, insomnia, sleepiness, snoring, restless legs, depression, anxiety, decreased energy, disinterest in activities, anemia, easy bruising, easy bleeding, feeling hot, feeling cold, increased  thirst, cramps, aching muscles, blurred vision, eye pain, shortness of breath, cough, wheezing, snoring, fevers and chills, fatigue, murmur, swelling in legs, birthmarks, itching. Pertinent negatives and positives per HPI. All others negative.   Social History    Socioeconomic History  . Marital status: Married    Spouse name: Not on file  . Number of children: Not on file  . Years of education: Not on file  . Highest education level: Not on file  Occupational History  . Not on file  Social Needs  . Financial resource strain: Not on file  . Food insecurity:    Worry: Not on file    Inability: Not on file  . Transportation needs:    Medical: Not on file    Non-medical: Not on file  Tobacco Use  . Smoking status: Never Smoker  . Smokeless tobacco: Never Used  Substance and Sexual Activity  . Alcohol use: Not on file  . Drug use: Not on file  . Sexual activity: Not on file  Lifestyle  . Physical activity:    Days per week: Not on file    Minutes per session: Not on file  . Stress: Not on file  Relationships  . Social connections:    Talks on phone: Not on file    Gets together: Not on file    Attends religious service: Not on file    Active member of club or organization: Not on file    Attends meetings of clubs or organizations: Not on file    Relationship status: Not on file  . Intimate partner violence:    Fear of current or ex partner: Not on file    Emotionally abused: Not on file    Physically abused: Not on file    Forced sexual activity: Not on file  Other Topics Concern  . Not on file  Social History Narrative  . Not on file    No family history on file.  Past Medical History:  Diagnosis Date  . Diabetes mellitus without complication (HCC)   . Hypertension   . Major depressive disorder, recurrent, mild (HCC)   . Mixed hyperlipidemia   . 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 Thyroid disease     No past surgical history on file.  Current Outpatient Medications  Medication Sig Dispense Refill  . aspirin 325 MG tablet Take 325 mg by mouth daily.    Marland Kitchen amLODipine (NORVASC) 5 MG tablet Take 5 mg by mouth daily. Take 1/2 tablet daily    . carvedilol (COREG) 25 MG tablet Take 25 mg by mouth 2  (two) times daily with a meal.    . Cholecalciferol (VITAMIN D-3) 1000 UNITS CAPS Take 1,000 Units by mouth daily.    . fenofibrate 160 MG tablet Take 160 mg by mouth daily.    . furosemide (LASIX) 20 MG tablet Take 20 mg by mouth daily.    Marland Kitchen glucose blood (ONE TOUCH ULTRA TEST) test strip USE 1 STRIP TO CHECK GLUCOSE THREE TIMES DAILY 100 each 3  . Magnesium 250 MG TABS Take 250 mg by mouth daily.    . metFORMIN (GLUCOPHAGE-XR) 500 MG 24 hr tablet Take 1 1/2 tablets two times daily (Patient taking differently: Take 500 mg by mouth daily. ) 120 tablet 3  . Multiple Vitamin (MULTI VITAMIN DAILY PO) Take by mouth daily.    . nitroGLYCERIN (NITROSTAT) 0.4 MG SL tablet Place 0.4 mg under the tongue every 5 (five) minutes  as needed for chest pain.    Letta Pate DELICA LANCETS 33G MISC USE 1 LANCET TO CHECK GLUCOSE 3 TIMES DAILY. Dx Code E11.9 100 each 1  . polyethylene glycol (MIRALAX / GLYCOLAX) packet Take 17 g by mouth daily as needed.    . Rivaroxaban (XARELTO) 15 MG TABS tablet Take 15 mg by mouth daily.    Marland Kitchen venlafaxine (EFFEXOR) 75 MG tablet Take 75 mg by mouth daily.      No current facility-administered medications for this visit.     Allergies as of 05/28/2018 - Review Complete 01/01/2018  Allergen Reaction Noted  . Atorvastatin Other (See Comments) 09/27/2016  . Budesonide Other (See Comments) 09/27/2016  . Gabapentin Other (See Comments) 09/27/2016  . Metoclopramide Other (See Comments) 09/27/2016  . Rosuvastatin Other (See Comments) 09/27/2016  . Simvastatin Other (See Comments) 09/27/2016  . Sulfamethoxazole-trimethoprim Rash 09/01/2015    Vitals: There were no vitals taken for this visit. Last Weight:  Wt Readings from Last 1 Encounters:  01/01/18 119 lb (54 kg)   Last Height:   Ht Readings from Last 1 Encounters:  01/01/18 5' (1.524 m)   Physical exam: Exam: Gen: NAD, conversant, well nourised, well groomed                     CV: RRR, no MRG. No Carotid Bruits. No  peripheral edema, warm, nontender Eyes: Conjunctivae clear without exudates or hemorrhage  Neuro: Detailed Neurologic Exam  Speech:    Speech is normal; fluent and spontaneous with normal comprehension.  Cognition:    The patient is oriented to person, place, and time;     recent and remote memory intact;     language fluent;     normal attention, concentration,     fund of knowledge Cranial Nerves:    The pupils are equal, round, and reactive to light. Attempted fundoscopy could not visualize.. Visual fields are full to finger confrontation. Extraocular movements are intact. Trigeminal sensation is intact and the muscles of mastication are normal. The face is symmetric. The palate elevates in the midline. Hearing intact. Voice is normal. Shoulder shrug is normal. The tongue has normal motion without fasciculations.   Coordination:    Normal finger to nose and heel to shin. Normal rapid alternating movements.   Gait:    Heel-toe and tandem gait are normal.   Motor Observation:    No asymmetry, no atrophy, and no involuntary movements noted. Tone:    Normal muscle tone.    Posture:    Posture is normal. normal erect    Strength:    Right prox weakness (shoulder arthropathy)     Sensation: intact to LT     Reflex Exam:  DTR's:    Deep tendon reflexes in the upper and lower extremities are brisk bilaterally.   Toes:    Right upgoing  Clonus:    Clonus is absent.    Assessment/Plan:  Bridget [name] is a 77 y.o. female here as requested by Dr. Sedalia Muta for clumsy left arm and speech difficulty. 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  She is on aspirin 81 mg, Xarelto 15 mg. She is on  anti-coagulation for a thrombotic event not for afib.  - Sounds like patient's episode was a TIA(right  arm and right leg sensory changes and incoordination for 5-7 minutes). MRI of the brain shows a remote cerebellar stroke. Other areas of gliosis can be traumatic sequelae(she has hit her head in each area) but also could be remote infarcts; if they are infarcts would look embolic.  - consider a TEE. Will discuss with cardiologist if her pacemaker can be interrogated often for afib or if a loop is indicated.   - If afib or aflutter I would recommend changing to Eliquis 5mg  twice daily  - untreated sleep apnea: She had a previous sleep study in [place] which was over 3 years ago and she was diagnosed with sleep apnea and she had difficulty with the machine. Now she has had strokes and TIA, feel we need revisit.  I had a long d/w patient about her recent stroke, risk for recurrent stroke/TIAs, personally independently reviewed imaging studies and stroke evaluation results and answered questions.Continue ASA and Xarelto for secondary stroke prevention and maintain strict control of hypertension with blood pressure goal below 130/90, diabetes with hemoglobin A1c goal below 6.5% and lipids with LDL cholesterol goal below 70 mg/dL  Orders Placed This Encounter  Procedures  . Ambulatory referral to Sleep Studies   Naomie Dean, MD  [facility] [address] [place], [place] [address]  Phone [phone] Fax [phone]

## 2018-05-28 NOTE — Patient Instructions (Addendum)
Will discuss with cardiologist and possibly order testing and change medications Sleep evaluation   Sleep Apnea Sleep apnea is a condition in which breathing pauses or becomes shallow during sleep. Episodes of sleep apnea usually last 10 seconds or longer, and they may occur as many as 20 times an hour. Sleep apnea disrupts your sleep and keeps your body from getting the rest that it needs. This condition can increase your risk of certain health problems, including:  Heart attack.  Stroke.  Obesity.  Diabetes.  Heart failure.  Irregular heartbeat.  There are three kinds of sleep apnea:  Obstructive sleep apnea. This kind is caused by a blocked or collapsed airway.  Central sleep apnea. This kind happens when the part of the brain that controls breathing does not send the correct signals to the muscles that control breathing.  Mixed sleep apnea. This is a combination of obstructive and central sleep apnea.  What are the causes? The most common cause of this condition is a collapsed or blocked airway. An airway can collapse or become blocked if:  Your throat muscles are abnormally relaxed.  Your tongue and tonsils are larger than normal.  You are overweight.  Your airway is smaller than normal.  What increases the risk? This condition is more likely to develop in people who:  Are overweight.  Smoke.  Have a smaller than normal airway.  Are elderly.  Are female.  Drink alcohol.  Take sedatives or tranquilizers.  Have a family history of sleep apnea.  What are the signs or symptoms? Symptoms of this condition include:  Trouble staying asleep.  Daytime sleepiness and tiredness.  Irritability.  Loud snoring.  Morning headaches.  Trouble concentrating.  Forgetfulness.  Decreased interest in sex.  Unexplained sleepiness.  Mood swings.  Personality changes.  Feelings of depression.  Waking up often during the night to urinate.  Dry  mouth.  Sore throat.  How is this diagnosed? This condition may be diagnosed with:  A medical history.  A physical exam.  A series of tests that are done while you are sleeping (sleep study). These tests are usually done in a sleep lab, but they may also be done at home.  How is this treated? Treatment for this condition aims to restore normal breathing and to ease symptoms during sleep. It may involve managing health issues that can affect breathing, such as high blood pressure or obesity. Treatment may include:  Sleeping on your side.  Using a decongestant if you have nasal congestion.  Avoiding the use of depressants, including alcohol, sedatives, and narcotics.  Losing weight if you are overweight.  Making changes to your diet.  Quitting smoking.  Using a device to open your airway while you sleep, such as: ? An oral appliance. This is a custom-made mouthpiece that shifts your lower jaw forward. ? A continuous positive airway pressure (CPAP) device. This device delivers oxygen to your airway through a mask. ? A nasal expiratory positive airway pressure (EPAP) device. This device has valves that you put into each nostril. ? A bi-level positive airway pressure (BPAP) device. This device delivers oxygen to your airway through a mask.  Surgery if other treatments do not work. During surgery, excess tissue is removed to create a wider airway.  It is important to get treatment for sleep apnea. Without treatment, this condition can lead to:  High blood pressure.  Coronary artery disease.  (Men) An inability to achieve or maintain an erection (impotence).  Reduced thinking  abilities.  Follow these instructions at home:  Make any lifestyle changes that your health care provider recommends.  Eat a healthy, well-balanced diet.  Take over-the-counter and prescription medicines only as told by your health care provider.  Avoid using depressants, including alcohol,  sedatives, and narcotics.  Take steps to lose weight if you are overweight.  If you were given a device to open your airway while you sleep, use it only as told by your health care provider.  Do not use any tobacco products, such as cigarettes, chewing tobacco, and e-cigarettes. If you need help quitting, ask your health care provider.  Keep all follow-up visits as told by your health care provider. This is important. Contact a health care provider if:  The device that you received to open your airway during sleep is uncomfortable or does not seem to be working.  Your symptoms do not improve.  Your symptoms get worse. Get help right away if:  You develop chest pain.  You develop shortness of breath.  You develop discomfort in your back, arms, or stomach.  You have trouble speaking.  You have weakness on one side of your body.  You have drooping in your face. These symptoms may represent a serious problem that is an emergency. Do not wait to see if the symptoms will go away. Get medical help right away. Call your local emergency services (911 in the [place] Do not drive yourself to the hospital. This information is not intended to replace advice given to you by your health care provider. Make sure you discuss any questions you have with your health care provider. Document Released: 07/21/2002 Document Revised: 03/26/2016 Document Reviewed: 05/10/2015 Elsevier Interactive Patient Education  [employer].

## 2018-06-03 DIAGNOSIS — G459 Transient cerebral ischemic attack, unspecified: Secondary | ICD-10-CM | POA: Insufficient documentation

## 2018-06-05 ENCOUNTER — Other Ambulatory Visit: Payer: Self-pay

## 2018-06-05 MED ORDER — METFORMIN HCL ER 500 MG PO TB24: ORAL_TABLET | ORAL | 0 refills | Status: DC

## 2018-06-10 DIAGNOSIS — Z23 Encounter for immunization: Secondary | ICD-10-CM | POA: Diagnosis not present

## 2018-07-03 DIAGNOSIS — J301 Allergic rhinitis due to pollen: Secondary | ICD-10-CM | POA: Diagnosis not present

## 2018-07-03 DIAGNOSIS — F331 Major depressive disorder, recurrent, moderate: Secondary | ICD-10-CM | POA: Diagnosis not present

## 2018-07-04 ENCOUNTER — Encounter: Payer: Self-pay | Admitting: Endocrinology

## 2018-07-04 ENCOUNTER — Ambulatory Visit (INDEPENDENT_AMBULATORY_CARE_PROVIDER_SITE_OTHER): Payer: Medicare Other | Admitting: Endocrinology

## 2018-07-04 VITALS — BP 146/78 | HR 69 | Ht 62.0 in | Wt 120.0 lb

## 2018-07-04 DIAGNOSIS — E041 Nontoxic single thyroid nodule: Secondary | ICD-10-CM | POA: Diagnosis not present

## 2018-07-04 DIAGNOSIS — E119 Type 2 diabetes mellitus without complications: Secondary | ICD-10-CM

## 2018-07-04 NOTE — Progress Notes (Signed)
Patient ID: Bridget [name], female   DOB: Sep 13, 1940, 77 y.o.   MRN: [id number]   Reason for Appointment: Diabetes follow-up   History of Present Illness   Diagnosis: Type 2 DIABETES MELITUS, date of diagnosis:  2007     Previous history: Her A1c at diagnosis was 7.9% She has been treated with a combination of metformin ER and Actos for several years Usually her A1c has been upper normal and her last A1c was 5.8 in 4/14  Recent history:  Her A1c has ranged between 5.1-6% and recently was 6% done by PCP   Her blood sugars are still looking excellent fasting readings as before are mildly increased and averaging about 120  This is with taking metformin only  She cannot tolerate more than 1000 mg of metformin ER because of the symptom of abdominal pain higher doses  She has only been on metformin 500 mg twice daily since she has abdominal discomfort with higher doses  Blood sugars are usually fairly good the rest of the day with only occasional strenuous readings of about 170 or 180  Also she has maintained her weight is trying to be as active as possible Usually she is reluctant to take any brand-name medication because of cost     Oral hypoglycemic drugs:   metformin ER 500 mg, 1 tablet 2x a day         Side effects from medications: None       Monitors blood glucose:  once a day.    Glucometer: One Touch.          Blood Glucose readings from monitor download: The bottom   PRE-MEAL Fasting Lunch Dinner Bedtime Overall  Glucose range:  106-136    101-184  Mean/median:  119  118   119   POST-MEAL PC Breakfast PC Lunch PC Dinner  Glucose range:  112-174 10 1-127  108, 111  Mean/median:  135     Previous readings:  PRE-MEAL Fasting Lunch Dinner Bedtime Overall  Glucose range:  111-163  130-177    99-231  Mean/median:  125    130   POST-MEAL PC Breakfast PC Lunch PC Dinner  Glucose range:    122, 231  Mean/median:           Meals: 3 meals per day.  Usually follows a healthy diet, portions controlled  Physical activity: exercise:  Walking 10-15 min at least every other day            Dietician visit: Most recent: 2007            Wt Readings from Last 3 Encounters:  07/04/18 120 lb (54.4 kg)  05/28/18 121 lb (54.9 kg)  01/01/18 119 lb (54 kg)   Lab Results  Component Value Date   HGBA1C 6.0 (A) 05/06/2018   HGBA1C 5.8 (A) 01/02/2018   HGBA1C 6.0 09/05/2017   Lab Results  Component Value Date   MICROALBUR <0.7 01/01/2018   LDLCALC 68 05/06/2018   CREATININE 1.13 01/01/2018     Allergies as of 07/04/2018      Reactions   Atorvastatin Other (See Comments)   "muscle Cramps"   Budesonide Other (See Comments)   "fatigue"   Gabapentin Other (See Comments)   "fatigue"   Metoclopramide Other (See Comments)   "mayalgia"   Rosuvastatin Other (See Comments)   "muscle cramps"   Simvastatin Other (See Comments)   "muscle Cramps"   Sulfamethoxazole-trimethoprim Rash      Medication  List        Accurate as of 07/04/18  9:07 AM. Always use your most recent med list.          amLODipine 5 MG tablet Commonly known as:  NORVASC Take 1/2 tablet by mouth daily   aspirin 81 MG chewable tablet Chew 81 mg by mouth daily.   carvedilol 25 MG tablet Commonly known as:  COREG Take 25 mg by mouth 2 (two) times daily with a meal.   fenofibrate 160 MG tablet Take 160 mg by mouth daily.   furosemide 20 MG tablet Commonly known as:  LASIX Take 20 mg by mouth daily as needed.   glucose blood test strip USE 1 STRIP TO CHECK GLUCOSE THREE TIMES DAILY   Magnesium 250 MG Tabs Take 250 mg by mouth daily.   metFORMIN 500 MG 24 hr tablet Commonly known as:  GLUCOPHAGE-XR Take 1 1/2 tablets two times daily   MULTI VITAMIN DAILY PO Take by mouth daily.   nitroGLYCERIN 0.4 MG SL tablet Commonly known as:  NITROSTAT Place 0.4 mg under the tongue every 5 (five) minutes as needed for chest pain.   ONETOUCH DELICA LANCETS 33G  Misc USE 1 LANCET TO CHECK GLUCOSE 3 TIMES DAILY. Dx Code E11.9   polyethylene glycol packet Commonly known as:  MIRALAX / GLYCOLAX Take 17 g by mouth daily as needed.   Rivaroxaban 15 MG Tabs tablet Commonly known as:  XARELTO Take 15 mg by mouth daily.   TRINTELLIX 10 MG Tabs tablet Generic drug:  vortioxetine HBr   Vitamin D-3 25 MCG (1000 UT) Caps Take 1,000 Units by mouth daily.       Allergies:  Allergies  Allergen Reactions  . Atorvastatin Other (See Comments)    "muscle Cramps"  . Budesonide Other (See Comments)    "fatigue"  . Gabapentin Other (See Comments)    "fatigue"  . Metoclopramide Other (See Comments)    "mayalgia"  . Rosuvastatin Other (See Comments)    "muscle cramps"  . Simvastatin Other (See Comments)    "muscle Cramps"  . Sulfamethoxazole-Trimethoprim Rash    Past Medical History:  Diagnosis Date  . Diabetes mellitus without complication (HCC)   . Fibromyalgia   . Heart disease   . Hypertension   . Kidney disease   . Kidney stones 01/12/1983  . Major depressive disorder, recurrent, mild (HCC)   . Mixed hyperlipidemia   . 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 Thyroid disease     Past Surgical History:  Procedure Laterality Date  . ABDOMINAL HYSTERECTOMY  03/05/1998   pt does not think this was a total hysterectomy  . APPENDECTOMY  12/31/1988  . BREAST LUMPECTOMY Left 05/22/1996  . pacemaker  09/27/2016    Family History  Problem Relation Age of Onset  . High blood pressure Mother   . Diabetes Mother        no medication needed  . Kidney disease Mother   . Pneumonia Mother   . High blood pressure Father     Social History:  reports that she has never smoked. She has never used smokeless tobacco. She reports that she does not drink alcohol or use drugs.  Review of Systems:  Hypertension:  blood pressure is treated with Coreg, amlodipine and Zestoretic, treated by other physicians  Home BP checked  periodically, usually systolic in the 130s followed periodically by cardiologist   Lipids: She has had hypertriglyceridemia controlled with fenofibrate These are well  controlled    Lab Results  Component Value Date   CHOL 147 05/06/2018   HDL 54 05/06/2018   LDLCALC 68 05/06/2018   LDLDIRECT 94.2 12/05/2013   TRIG 124 05/06/2018   CHOLHDL 2 09/05/2017     THYROID:  She was previously on thyroid suppression because of her long-standing benign thyroid nodule.  She has had 2 biopsies done on this previously Because of low normal TSH her 50 mcg Synthroid was stopped in 10/14 TSH has been consistently normal subsequently  Her ultrasound last showed mostly cystic nodule in the right side about 1.6 cm  Lab Results  Component Value Date   TSH 1.26 09/05/2017   TSH 1.70 01/18/2017   TSH 0.74 02/29/2016   Last foot exam: 5/19    Examination:   BP (!) 146/78   Pulse 69   Ht 5\' 2"  (1.575 m)   Wt 120 lb (54.4 kg)   SpO2 96%   BMI 21.95 kg/m   Body mass index is 21.95 kg/m.   Has a thyroid nodule on the right side, smooth and firm, measures about 1-1.5 cm and somewhat indistinct Fullness in the right lower lobe present Left lobe not palpable  Bicep reflexes normal No pedal edema   ASSESSMENT/ PLAN:   Diabetes type 2 on metformin only  Her blood sugars are well controlled A1c is 6%  She is on metformin only, 1000 mg a day using extended release which is better tolerated and she is not able to tolerate any higher doses she has maintained her weight Usually trying to be as active as possible and watching her diet  Fasting readings are mildly increased as before he is not monitoring enough readings after meals but usually these are fairly good also   Hypertension: Followed by cardiologist and nephrologist with slightly higher blood pressure in the office today  LIPIDS: Will have these checked with her PCP periodically   History of thyroid nodule: Unchanged on  today's exam and will continue to follow To have TSH checked once a year  Follow-up in 6 months  There are no Patient Instructions on file for this visit.   Reather Littler 07/04/2018, 9:07 AM

## 2018-07-17 ENCOUNTER — Ambulatory Visit (INDEPENDENT_AMBULATORY_CARE_PROVIDER_SITE_OTHER): Payer: Medicare Other | Admitting: Neurology

## 2018-07-17 ENCOUNTER — Encounter: Payer: Self-pay | Admitting: Neurology

## 2018-07-17 VITALS — BP 134/72 | HR 69 | Ht 62.0 in | Wt 119.0 lb

## 2018-07-17 DIAGNOSIS — Z789 Other specified health status: Secondary | ICD-10-CM | POA: Diagnosis not present

## 2018-07-17 DIAGNOSIS — G4733 Obstructive sleep apnea (adult) (pediatric): Secondary | ICD-10-CM

## 2018-07-17 NOTE — Progress Notes (Signed)
Subjective:    Patient ID: Bridget [name] is a 77 y.o. female.  HPI     Huston Foley, MD, PhD [facility] [address], [address] [address] [place], [place] [address]  Dear Bridget [name],   I saw your patient, Bridget [name], upon your kind request, in my sleep clinic today for initial consultation of her sleep disorder, in particular, concern for underlying obstructive sleep apnea. The patient is unaccompanied today. As you know, Bridget [name] is a 77 year old right-handed woman with an underlying medical history of hypertension, hyperlipidemia, diabetes, history of TIA, thyroid disease, and depression, who reports snoring and excessive daytime somnolence. I reviewed your office note from 05/28/2018. She reports having had a sleep study in the past and was diagnosed with obstructive sleep apnea, she could not tolerate CPAP at the time. Her Epworth sleepiness score is 16 out of 24 today, fatigue score is 38 out of 63.  Her husband reports that she makes gasping sounds. She is sleepy during the day, naps daily.  BT is around 9:30 or 10, rise time around 6:30 to 7 AM. She has nocturia about 3-4 times per night, has had occasional morning headaches. Sleep study testing in the past was about 10 years ago. She may have tried CPAP for a few years, she is not fully sure. She is reluctant to pursue a sleep study at this time. She has had arthritis and needs to pursue treatment for her shoulders, may need shoulder replacement surgeries on both sides, right is more severe. She put this off for the past few years in the need to do eventually a pacemaker placement last year and again delayed surgery for her shoulder. She denies any family history of OSA. She is a nonsmoker and does not utilize alcohol and does not drink caffeine on a daily basis.  Her Past Medical History Is Significant For: Past Medical History:  Diagnosis Date  . Diabetes mellitus without complication (HCC)   .  Fibromyalgia   . Heart disease   . Hypertension   . Kidney disease   . Kidney stones 01/12/1983  . Major depressive disorder, recurrent, mild (HCC)   . Mixed hyperlipidemia   . 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 Thyroid disease     Her Past Surgical History Is Significant For: Past Surgical History:  Procedure Laterality Date  . ABDOMINAL HYSTERECTOMY  03/05/1998   pt does not think this was a total hysterectomy  . APPENDECTOMY  12/31/1988  . BREAST LUMPECTOMY Left 05/22/1996  . pacemaker  09/27/2016    Her Family History Is Significant For: Family History  Problem Relation Age of Onset  . High blood pressure Mother   . Diabetes Mother        no medication needed  . Kidney disease Mother   . Pneumonia Mother   . High blood pressure Father     Her Social History Is Significant For: Social History   Socioeconomic History  . Marital status: Married    Spouse name: Not on file  . Number of children: 3  . Years of education: Not on file  . Highest education level: 9th grade  Occupational History  . Not on file  Social Needs  . Financial resource strain: Not on file  . Food insecurity:    Worry: Not on file    Inability: Not on file  . Transportation needs:    Medical: Not on file    Non-medical: Not on  file  Tobacco Use  . Smoking status: Never Smoker  . Smokeless tobacco: Never Used  Substance and Sexual Activity  . Alcohol use: Never    Frequency: Never  . Drug use: Never  . Sexual activity: Not on file  Lifestyle  . Physical activity:    Days per week: Not on file    Minutes per session: Not on file  . Stress: Not on file  Relationships  . Social connections:    Talks on phone: Not on file    Gets together: Not on file    Attends religious service: Not on file    Active member of club or organization: Not on file    Attends meetings of clubs or organizations: Not on file    Relationship status: Not on file  Other Topics  Concern  . Not on file  Social History Narrative   Lives at home with her husband   Right handed   Caffeine: mostly tea, 0-2 cups a day    Her Allergies Are:  Allergies  Allergen Reactions  . Atorvastatin Other (See Comments)    "muscle Cramps"  . Budesonide Other (See Comments)    "fatigue"  . Gabapentin Other (See Comments)    "fatigue"  . Metoclopramide Other (See Comments)    "mayalgia"  . Rosuvastatin Other (See Comments)    "muscle cramps"  . Simvastatin Other (See Comments)    "muscle Cramps"  . Sulfamethoxazole-Trimethoprim Rash  :   Her Current Medications Are:  Outpatient Encounter Medications as of 07/17/2018  Medication Sig  . amLODipine (NORVASC) 5 MG tablet Take 1/2 tablet by mouth daily  . aspirin 81 MG chewable tablet Chew 81 mg by mouth daily.  . carvedilol (COREG) 25 MG tablet Take 25 mg by mouth 2 (two) times daily with a meal.  . Cholecalciferol (VITAMIN D-3) 1000 UNITS CAPS Take 1,000 Units by mouth daily.  . fenofibrate 160 MG tablet Take 160 mg by mouth daily.  . furosemide (LASIX) 20 MG tablet Take 20 mg by mouth daily as needed.   Marland Kitchen glucose blood (ONE TOUCH ULTRA TEST) test strip USE 1 STRIP TO CHECK GLUCOSE THREE TIMES DAILY  . Magnesium 250 MG TABS Take 250 mg by mouth daily.  . metFORMIN (GLUCOPHAGE-XR) 500 MG 24 hr tablet Take 1 1/2 tablets two times daily (Patient taking differently: Take 1  tablets two times daily)  . Multiple Vitamin (MULTI VITAMIN DAILY PO) Take by mouth daily.  . nitroGLYCERIN (NITROSTAT) 0.4 MG SL tablet Place 0.4 mg under the tongue every 5 (five) minutes as needed for chest pain.  Letta Pate DELICA LANCETS 33G MISC USE 1 LANCET TO CHECK GLUCOSE 3 TIMES DAILY. Dx Code E11.9  . polyethylene glycol (MIRALAX / GLYCOLAX) packet Take 17 g by mouth daily as needed.  . Rivaroxaban (XARELTO) 15 MG TABS tablet Take 15 mg by mouth daily.  Marland Kitchen venlafaxine (EFFEXOR) 75 MG tablet Take 75 mg by mouth daily.  . TRINTELLIX 10 MG TABS  tablet    No facility-administered encounter medications on file as of 07/17/2018.   :  Review of Systems:  Out of a complete 14 point review of systems, all are reviewed and negative with the exception of these symptoms as listed below: Review of Systems  Neurological:       Pt presents today to discuss her sleep. Pt has had a cpap in the past but could not tolerate it. Pt does endorse snoring.  Epworth Sleepiness Scale 0=  would never doze 1= slight chance of dozing 2= moderate chance of dozing 3= high chance of dozing  Sitting and reading: 3 Watching TV: 2 Sitting inactive in a public place (ex. Theater or meeting): 0 As a passenger in a car for an hour without a break: 3 Lying down to rest in the afternoon: 3 Sitting and talking to someone: 2 Sitting quietly after lunch (no alcohol): 3 In a car, while stopped in traffic: 0 Total: 16     Objective:  Neurological Exam  Physical Exam Physical Examination:   Vitals:   07/17/18 1113  BP: 134/72  Pulse: 69    General Examination: The patient is a very pleasant 77 y.o. female in no acute distress. She appears well-developed and well-nourished and well groomed.   HEENT: Normocephalic, atraumatic, pupils are equal, round and reactive to light and accommodation. Extraocular tracking is good without limitation to gaze excursion or nystagmus noted. Normal smooth pursuit is noted. Hearing is grossly intact. Face is symmetric with normal facial animation and normal facial sensation. Speech is clear with no dysarthria noted. There is no hypophonia. There is no lip, neck/head, jaw or voice tremor. Neck is supple with full range of passive and active motion. There are no carotid bruits on auscultation. Oropharynx exam reveals: mild mouth dryness, adequate dental hygiene and mild airway crowding, due to tonsils in place, left 1-2+ in size, right about 1+ in size, Mallampati is class II, smaller airway entry, neck circumference of 12-3/8  inches. Tongue protrudes centrally and palate elevates symmetrically.  Chest: Clear to auscultation without wheezing, rhonchi or crackles noted.  Heart: S1+S2+0, regular and normal without murmurs, rubs or gallops noted.   Abdomen: Soft, non-tender and non-distended with normal bowel sounds appreciated on auscultation.  Extremities: There is no pitting edema in the distal lower extremities bilaterally. Pedal pulses are intact.  Skin: Warm and dry without trophic changes noted.  Musculoskeletal: exam reveals decreased range of motion in both shoulders, right shoulder is higher than left, she has more limitation in mobility in the right shoulder. She has prominent arthritic changes in both hands, reports left hip pain.   Neurologically:  Mental status: The patient is awake, alert and oriented in all 4 spheres. Her immediate and remote memory, attention, language skills and fund of knowledge are appropriate. There is no evidence of aphasia, agnosia, apraxia or anomia. Speech is clear with normal prosody and enunciation. Thought process is linear. Mood is normal and affect is normal.  Cranial nerves II - XII are as described above under HEENT exam. In addition: shoulder shrug is normal with equal shoulder height noted. Motor exam: Normal bulk, strength and tone is noted. There is no tremor. Fine motor skills and coordination: grossly intact.  Cerebellar testing: No dysmetria or intention tremor on finger to nose testing. Heel to shin is unremarkable bilaterally. There is no truncal or gait ataxia.  Sensory exam: intact to light touch.  Gait, station and balance: She stands easily. No veering to one side is noted. No leaning to one side is noted. Posture is age-appropriate and stance is narrow based. Gait shows normal stride length and normal pace. No problems turning are noted.   Assessment and plan:  In summary, Angi Goodell is a very pleasant 77 y.o.-year old female with an underlying  medical history of hypertension, hyperlipidemia, diabetes, history of TIA, thyroid disease, and depression, who presents for evaluation of her prior diagnosis of sleep apnea. She had testing perhaps about 10  years ago and was diagnosed with sleep apnea and tried CPAP for some time but reports that she could not tolerate it. She is reluctant to get reevaluated with a sleep study. I had a long chat with the patient and her husband about my findings and the diagnosis of OSA, its prognosis and treatment options. We talked about medical treatments, surgical interventions and non-pharmacological approaches. I explained in particular the risks and ramifications of untreated moderate to severe OSA, especially with respect to developing cardiovascular disease down the Road, including congestive heart failure, difficult to treat hypertension, cardiac arrhythmias, or stroke. Even type 2 diabetes has, in part, been linked to untreated OSA. Symptoms of untreated OSA include daytime sleepiness, memory problems, mood irritability and mood disorder such as depression and anxiety, lack of energy, as well as recurrent headaches, especially morning headaches. I recommended the following at this time: sleep study with potential positive airway pressure titration. (We will score hypopneas at 4%). She is not ready to proceed with a sleep study. She is encouraged to think about it and call our office when she is ready to pursue it.  I answered all their questions today and the patient and her husband were in agreement. I plan to see her back after the sleep study is completed and encouraged them to call with any interim questions, concerns, problems or updates.   Thank you very much for allowing me to participate in the care of this nice patient. If I can be of any further assistance to you please do not hesitate to call me at [phone].  Sincerely,   Huston Foley, MD, PhD

## 2018-07-17 NOTE — Patient Instructions (Addendum)
Thank you for choosing [facility] for your sleep related care! It was nice to meet you today! I appreciate that you entrust me with your sleep related healthcare concerns. I hope, I was able to address at least some of your concerns today, and that I can help you feel reassured and also get better.    Here is what we discussed today and what we came up with as our plan for you:    Based on your symptoms and your exam I believe you may still at risk for obstructive sleep apnea and would benefit from reevaluation as it has been years and you may benefit from CPAP therapy. Therefore, I think we should proceed with a sleep study to determine how severe your sleep apnea is. If you have more than mild OSA, I want you to consider ongoing treatment with CPAP. Please remember, the risks and ramifications of moderate to severe obstructive sleep apnea or OSA are: Cardiovascular disease, including congestive heart failure, stroke, difficult to control hypertension, arrhythmias, and even type 2 diabetes has been linked to untreated OSA. Sleep apnea causes disruption of sleep and sleep deprivation in most cases, which, in turn, can cause recurrent headaches, problems with memory, mood, concentration, focus, and vigilance. Most people with untreated sleep apnea report excessive daytime sleepiness, which can affect their ability to drive. Please do not drive if you feel sleepy.  Please think about the sleep study and call us when you are ready to proceed.   I will likely see you back after your sleep study to go over the test results and where to go from there. We will call you after your sleep study to advise about the results (most likely, you will hear from [place], my nurse) and to set up an appointment at the time, as necessary.    Our sleep lab administrative assistant will call you to schedule your sleep study. If you don't hear back from her by about 2 weeks from now, please feel free to call  her at [phone]. You can leave a message with your phone number and concerns, if you get the voicemail box. She will call back as soon as possible.

## 2018-07-23 ENCOUNTER — Other Ambulatory Visit: Payer: Self-pay

## 2018-07-23 MED ORDER — GLUCOSE BLOOD VI STRP: ORAL_STRIP | 3 refills | Status: DC

## 2018-08-20 ENCOUNTER — Other Ambulatory Visit: Payer: Self-pay

## 2018-08-20 MED ORDER — METFORMIN HCL ER 500 MG PO TB24: ORAL_TABLET | ORAL | 0 refills | Status: DC

## 2018-08-22 DIAGNOSIS — I442 Atrioventricular block, complete: Secondary | ICD-10-CM | POA: Diagnosis not present

## 2018-08-22 DIAGNOSIS — Z950 Presence of cardiac pacemaker: Secondary | ICD-10-CM | POA: Diagnosis not present

## 2018-08-30 DIAGNOSIS — Z7901 Long term (current) use of anticoagulants: Secondary | ICD-10-CM | POA: Insufficient documentation

## 2018-08-30 DIAGNOSIS — I442 Atrioventricular block, complete: Secondary | ICD-10-CM | POA: Diagnosis not present

## 2018-08-30 DIAGNOSIS — I5032 Chronic diastolic (congestive) heart failure: Secondary | ICD-10-CM | POA: Diagnosis not present

## 2018-08-30 DIAGNOSIS — I471 Supraventricular tachycardia: Secondary | ICD-10-CM | POA: Diagnosis not present

## 2018-08-30 DIAGNOSIS — Z45018 Encounter for adjustment and management of other part of cardiac pacemaker: Secondary | ICD-10-CM | POA: Diagnosis not present

## 2018-10-08 ENCOUNTER — Telehealth: Payer: Self-pay

## 2018-10-08 DIAGNOSIS — G4733 Obstructive sleep apnea (adult) (pediatric): Secondary | ICD-10-CM

## 2018-10-08 DIAGNOSIS — Z789 Other specified health status: Secondary | ICD-10-CM

## 2018-10-08 NOTE — Telephone Encounter (Signed)
VO for HST and split night order from Dr. Frances Furbish received. HST and split night order placed.

## 2018-10-08 NOTE — Addendum Note (Signed)
Addended by: Geronimo Running A on: 10/08/2018 12:05 PM   Modules accepted: Orders

## 2018-10-08 NOTE — Telephone Encounter (Signed)
Patient has called stating that she is ready to proceed with the sleep study now. Can I get an order for HST and in lab sleep study? Thanks

## 2018-10-14 DIAGNOSIS — N182 Chronic kidney disease, stage 2 (mild): Secondary | ICD-10-CM | POA: Diagnosis not present

## 2018-10-14 DIAGNOSIS — N189 Chronic kidney disease, unspecified: Secondary | ICD-10-CM | POA: Diagnosis not present

## 2018-10-14 DIAGNOSIS — D631 Anemia in chronic kidney disease: Secondary | ICD-10-CM | POA: Diagnosis not present

## 2018-10-15 ENCOUNTER — Telehealth: Payer: Self-pay | Admitting: Neurology

## 2018-10-15 NOTE — Telephone Encounter (Signed)
error 

## 2018-10-19 ENCOUNTER — Other Ambulatory Visit: Payer: Self-pay | Admitting: Endocrinology

## 2018-10-22 DIAGNOSIS — Z1231 Encounter for screening mammogram for malignant neoplasm of breast: Secondary | ICD-10-CM | POA: Diagnosis not present

## 2018-10-30 ENCOUNTER — Other Ambulatory Visit: Payer: Self-pay

## 2018-10-30 ENCOUNTER — Telehealth: Payer: Self-pay | Admitting: Endocrinology

## 2018-10-30 MED ORDER — ONETOUCH DELICA PLUS LANCET33G MISC: 1.0000 | Freq: Three times a day (TID) | 0 refills | Status: DC

## 2018-10-30 NOTE — Telephone Encounter (Signed)
Patient stated the pharmacy told them that in order for them to Refill the Lancets the prescription is needing RX ICD-10 code  Patient is completely out of these and needing them as soon as possible.  [facility] [address] [place] - [address]

## 2018-10-30 NOTE — Telephone Encounter (Signed)
Rx sent with Dx code.

## 2018-11-26 DIAGNOSIS — G4733 Obstructive sleep apnea (adult) (pediatric): Secondary | ICD-10-CM | POA: Diagnosis not present

## 2018-11-26 DIAGNOSIS — I119 Hypertensive heart disease without heart failure: Secondary | ICD-10-CM | POA: Diagnosis not present

## 2018-11-26 DIAGNOSIS — F330 Major depressive disorder, recurrent, mild: Secondary | ICD-10-CM | POA: Diagnosis not present

## 2018-11-26 DIAGNOSIS — I6523 Occlusion and stenosis of bilateral carotid arteries: Secondary | ICD-10-CM | POA: Diagnosis not present

## 2018-11-26 DIAGNOSIS — E782 Mixed hyperlipidemia: Secondary | ICD-10-CM | POA: Diagnosis not present

## 2018-11-26 DIAGNOSIS — E1121 Type 2 diabetes mellitus with diabetic nephropathy: Secondary | ICD-10-CM | POA: Diagnosis not present

## 2018-11-26 DIAGNOSIS — J4520 Mild intermittent asthma, uncomplicated: Secondary | ICD-10-CM | POA: Diagnosis not present

## 2018-11-26 DIAGNOSIS — G458 Other transient cerebral ischemic attacks and related syndromes: Secondary | ICD-10-CM | POA: Diagnosis not present

## 2018-12-06 DIAGNOSIS — Z950 Presence of cardiac pacemaker: Secondary | ICD-10-CM | POA: Diagnosis not present

## 2019-01-02 ENCOUNTER — Ambulatory Visit (INDEPENDENT_AMBULATORY_CARE_PROVIDER_SITE_OTHER): Payer: Medicare Other | Admitting: Endocrinology

## 2019-01-02 ENCOUNTER — Encounter: Payer: Self-pay | Admitting: Endocrinology

## 2019-01-02 ENCOUNTER — Other Ambulatory Visit: Payer: Self-pay

## 2019-01-02 VITALS — BP 130/64 | HR 64 | Ht 62.0 in | Wt 119.4 lb

## 2019-01-02 DIAGNOSIS — E119 Type 2 diabetes mellitus without complications: Secondary | ICD-10-CM

## 2019-01-02 DIAGNOSIS — E041 Nontoxic single thyroid nodule: Secondary | ICD-10-CM | POA: Diagnosis not present

## 2019-01-02 LAB — COMPREHENSIVE METABOLIC PANEL
ALT: 15 U/L (ref 0–35)
AST: 29 U/L (ref 0–37)
Albumin: 3.6 g/dL (ref 3.5–5.2)
Alkaline Phosphatase: 45 U/L (ref 39–117)
BUN: 22 mg/dL (ref 6–23)
CO2: 26 mEq/L (ref 19–32)
Calcium: 8.9 mg/dL (ref 8.4–10.5)
Chloride: 104 mEq/L (ref 96–112)
Creatinine, Ser: 0.99 mg/dL (ref 0.40–1.20)
GFR: 54.56 mL/min — ABNORMAL LOW (ref >60.00)
Glucose, Bld: 110 mg/dL — ABNORMAL HIGH (ref 70–99)
Potassium: 4 mEq/L (ref 3.5–5.1)
Sodium: 140 mEq/L (ref 135–145)
Total Bilirubin: 0.5 mg/dL (ref 0.2–1.2)
Total Protein: 6.2 g/dL (ref 6.0–8.3)

## 2019-01-02 LAB — TSH: TSH: 1.4 u[IU]/mL (ref 0.35–4.50)

## 2019-01-02 LAB — MICROALBUMIN / CREATININE URINE RATIO
Creatinine,U: 136.1 mg/dL
Microalb Creat Ratio: 1.3 mg/g (ref 0.0–30.0)
Microalb, Ur: 1.8 mg/dL (ref 0.0–1.9)

## 2019-01-02 LAB — POCT GLYCOSYLATED HEMOGLOBIN (HGB A1C): Hemoglobin A1C: 6.1 % — AB (ref 4.0–5.6)

## 2019-01-02 LAB — T4, FREE: Free T4: 1.02 ng/dL (ref 0.60–1.60)

## 2019-01-02 NOTE — Patient Instructions (Signed)
Check blood sugars on waking up 3 days a week  Also check blood sugars about 2 hours after meals and do this after different meals by rotation  Recommended blood sugar levels on waking up are 90-130 and about 2 hours after meal is 130-160  Please bring your blood sugar monitor to each visit, thank you   

## 2019-01-02 NOTE — Progress Notes (Signed)
Patient ID: Bridget [name], female   DOB: 07/02/1941, 78 y.o.   MRN: [id number]   Reason for Appointment: Diabetes follow-up   History of Present Illness   Diagnosis: Type 2 DIABETES MELITUS, date of diagnosis:  2007     Previous history: Her A1c at diagnosis was 7.9% She has been treated with a combination of metformin ER and Actos for several years Usually her A1c has been upper normal and her last A1c was 5.8 in 4/14  Recent history:  Her A1c has ranged between 5.1-6% and now 6.1   Blood sugars are still overall reasonably controlled  However she is checking only fasting readings lately, previously had blood sugars up to about 180 after meals at times  As before he is taking metformin only which she can only tolerate up to 2 tablets a day  Her weight is about the same  She is usually trying to watch her diet  However not always able to exercise more by walking Usually she is reluctant to take any brand-name medication because of cost     Oral hypoglycemic drugs:   metformin ER 500 mg, 1 tablet 2x a day         Side effects from medications:  Abdominal discomfort from high-dose metformin       Monitors blood glucose:  once a day or less.    Glucometer: One Touch.          Blood Glucose readings from monitor download:   Recent FASTING range 113-131, median 124 Nonfasting 100  Her previous readings:  PRE-MEAL Fasting Lunch Dinner Bedtime Overall  Glucose range:  106-136    101-184  Mean/median:  119  118   119   POST-MEAL PC Breakfast PC Lunch PC Dinner  Glucose range:  112-174 10 1-127  108, 111  Mean/median:  135          Meals: 3 meals per day. Usually follows a healthy diet, portions controlled             Dietician visit: Most recent: 2007            Wt Readings from Last 3 Encounters:  01/02/19 119 lb 6.4 oz (54.2 kg)  07/17/18 119 lb (54 kg)  07/04/18 120 lb (54.4 kg)   Lab Results  Component Value Date   HGBA1C 6.1 (A)  01/02/2019   HGBA1C 6.0 (A) 05/06/2018   HGBA1C 5.8 (A) 01/02/2018   Lab Results  Component Value Date   MICROALBUR <0.7 01/01/2018   LDLCALC 68 05/06/2018   CREATININE 1.13 01/01/2018   Other active problems discussed: See review of systems   Allergies as of 01/02/2019      Reactions   Atorvastatin Other (See Comments)   "muscle Cramps"   Budesonide Other (See Comments)   "fatigue"   Gabapentin Other (See Comments)   "fatigue"   Metoclopramide Other (See Comments)   "mayalgia"   Rosuvastatin Other (See Comments)   "muscle cramps"   Simvastatin Other (See Comments)   "muscle Cramps"   Sulfamethoxazole-trimethoprim Rash      Medication List       Accurate as of Jan 02, 2019 12:32 PM. If you have any questions, ask your nurse or doctor.        STOP taking these medications   Trintellix 10 MG Tabs tablet Generic drug:  vortioxetine HBr Stopped by:  Reather LittlerAjay Ember Henrikson, MD     TAKE these medications   amLODipine 5 MG tablet  Commonly known as:  NORVASC Take 1/2 tablet by mouth daily   aspirin 81 MG chewable tablet Chew 81 mg by mouth daily.   carvedilol 25 MG tablet Commonly known as:  COREG Take 25 mg by mouth 2 (two) times daily with a meal.   fenofibrate 160 MG tablet Take 160 mg by mouth daily.   furosemide 20 MG tablet Commonly known as:  LASIX Take 20 mg by mouth daily as needed.   glucose blood test strip Commonly known as:  ONE TOUCH ULTRA TEST USE 1 STRIP TO CHECK GLUCOSE THREE TIMES DAILY   Magnesium 250 MG Tabs Take 250 mg by mouth daily.   metFORMIN 500 MG 24 hr tablet Commonly known as:  GLUCOPHAGE-XR Take 1  tablets two times daily   MULTI VITAMIN DAILY PO Take by mouth daily.   nitroGLYCERIN 0.4 MG SL tablet Commonly known as:  NITROSTAT Place 0.4 mg under the tongue every 5 (five) minutes as needed for chest pain.   OneTouch Delica Plus Lancet33G Misc 1 each by Other route 3 (three) times daily. Use as instructed to check blood sugar 3  times daily.DX:E11.9   polyethylene glycol 17 g packet Commonly known as:  MIRALAX / GLYCOLAX Take 17 g by mouth daily as needed.   Rivaroxaban 15 MG Tabs tablet Commonly known as:  XARELTO Take 15 mg by mouth daily.   venlafaxine 75 MG tablet Commonly known as:  EFFEXOR Take 75 mg by mouth daily.   Vitamin D-3 25 MCG (1000 UT) Caps Take 1,000 Units by mouth daily.       Allergies:  Allergies  Allergen Reactions  . Atorvastatin Other (See Comments)    "muscle Cramps"  . Budesonide Other (See Comments)    "fatigue"  . Gabapentin Other (See Comments)    "fatigue"  . Metoclopramide Other (See Comments)    "mayalgia"  . Rosuvastatin Other (See Comments)    "muscle cramps"  . Simvastatin Other (See Comments)    "muscle Cramps"  . Sulfamethoxazole-Trimethoprim Rash    Past Medical History:  Diagnosis Date  . Diabetes mellitus without complication (HCC)   . Fibromyalgia   . Heart disease   . Hypertension   . Kidney disease   . Kidney stones 01/12/1983  . Major depressive disorder, recurrent, mild (HCC)   . Mixed hyperlipidemia   . 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 Thyroid disease     Past Surgical History:  Procedure Laterality Date  . ABDOMINAL HYSTERECTOMY  03/05/1998   pt does not think this was a total hysterectomy  . APPENDECTOMY  12/31/1988  . BREAST LUMPECTOMY Left 05/22/1996  . pacemaker  09/27/2016    Family History  Problem Relation Age of Onset  . High blood pressure Mother   . Diabetes Mother        no medication needed  . Kidney disease Mother   . Pneumonia Mother   . High blood pressure Father     Social History:  reports that she has never smoked. She has never used smokeless tobacco. She reports that she does not drink alcohol or use drugs.  Review of Systems:  Hypertension:  blood pressure is treated with Coreg, amlodipine and, treated by other physicians  She does check her blood pressure at home also  She  has a history of CAD followed by cardiologist  Lipids: She has had hypertriglyceridemia controlled with fenofibrate  Labs as below  Lab Results  Component Value Date  CHOL 147 05/06/2018   HDL 54 05/06/2018   LDLCALC 68 05/06/2018   LDLDIRECT 94.2 12/05/2013   TRIG 124 05/06/2018   CHOLHDL 2 09/05/2017     THYROID:  She was previously on thyroid suppression because of her long-standing benign thyroid nodule.  She has had 2 biopsies done on this previously Because of low normal TSH her 50 mcg Synthroid was stopped in 10/14 TSH has been consistently normal subsequently  Her ultrasound last showed mostly cystic nodule in the right side about 1.6 cm  Lab Results  Component Value Date   TSH 1.26 09/05/2017   TSH 1.70 01/18/2017   TSH 0.74 02/29/2016   Last foot exam: 5/19    Examination:   BP 130/64 (BP Location: Left Arm, Patient Position: Sitting, Cuff Size: Normal)   Pulse 64   Ht 5\' 2"  (1.575 m)   Wt 119 lb 6.4 oz (54.2 kg)   SpO2 95%   BMI 21.84 kg/m   Body mass index is 21.84 kg/m.   Thyroid nodule is difficult to palpate posteriorly on the right lobe and about 1.5 cm in size  Left lobe not palpable No lymphadenopathy in the neck  Diabetic Foot Exam - Simple   Simple Foot Form Diabetic Foot exam was performed with the following findings:  Yes 01/02/2019  9:28 AM  Visual Inspection No deformities, no ulcerations, no other skin breakdown bilaterally:  Yes Sensation Testing Intact to touch and monofilament testing bilaterally:  Yes Pulse Check Posterior Tibialis and Dorsalis pulse intact bilaterally:  Yes Comments       ASSESSMENT/ PLAN:   Diabetes type 2 on metformin 500 mg twice daily only  Her blood sugars are well controlled A1c is 6.1 %  Although her levels of control are good and A1c still in normal range her fasting readings appear to be slightly higher compared to before  She has not monitored her after meals Discussed the benefits of  SGLT2 drugs for cardiovascular risk reduction She is a good candidate for this Will send a message to her cardiologist to consider this while reducing her blood pressure medication Given her information on Jardiance  Hypertension: Followed by cardiologist  LIPIDS: Adequately controlled and followed by other physicians   History of thyroid nodule: She has a small stable nodule which is relatively difficult to palpate on the right side  Follow-up in 3 months  Patient Instructions  Check blood sugars on waking up 3 days a week  Also check blood sugars about 2 hours after meals and do this after different meals by rotation  Recommended blood sugar levels on waking up are 90-130 and about 2 hours after meal is 130-160  Please bring your blood sugar monitor to each visit, thank you      Reather Littler 01/02/2019, 12:32 PM

## 2019-01-06 ENCOUNTER — Ambulatory Visit (INDEPENDENT_AMBULATORY_CARE_PROVIDER_SITE_OTHER): Payer: Medicare Other | Admitting: Neurology

## 2019-01-06 ENCOUNTER — Other Ambulatory Visit: Payer: Self-pay

## 2019-01-06 DIAGNOSIS — R0683 Snoring: Secondary | ICD-10-CM

## 2019-01-06 DIAGNOSIS — G4733 Obstructive sleep apnea (adult) (pediatric): Secondary | ICD-10-CM

## 2019-01-06 DIAGNOSIS — Z789 Other specified health status: Secondary | ICD-10-CM

## 2019-01-06 DIAGNOSIS — G4720 Circadian rhythm sleep disorder, unspecified type: Secondary | ICD-10-CM

## 2019-01-06 NOTE — Progress Notes (Signed)
Please call to let patient know that the lab results are good with glucose 110 and thyroid normal.  Have been faxed to PCP

## 2019-01-08 DIAGNOSIS — E119 Type 2 diabetes mellitus without complications: Secondary | ICD-10-CM | POA: Diagnosis not present

## 2019-01-08 DIAGNOSIS — H25813 Combined forms of age-related cataract, bilateral: Secondary | ICD-10-CM | POA: Diagnosis not present

## 2019-01-10 DIAGNOSIS — M8589 Other specified disorders of bone density and structure, multiple sites: Secondary | ICD-10-CM | POA: Diagnosis not present

## 2019-01-10 DIAGNOSIS — Z6822 Body mass index (BMI) 22.0-22.9, adult: Secondary | ICD-10-CM | POA: Diagnosis not present

## 2019-01-10 DIAGNOSIS — Z0000 Encounter for general adult medical examination without abnormal findings: Secondary | ICD-10-CM | POA: Diagnosis not present

## 2019-01-14 ENCOUNTER — Telehealth: Payer: Self-pay

## 2019-01-14 DIAGNOSIS — L821 Other seborrheic keratosis: Secondary | ICD-10-CM | POA: Diagnosis not present

## 2019-01-14 DIAGNOSIS — L820 Inflamed seborrheic keratosis: Secondary | ICD-10-CM | POA: Diagnosis not present

## 2019-01-14 DIAGNOSIS — R233 Spontaneous ecchymoses: Secondary | ICD-10-CM | POA: Diagnosis not present

## 2019-01-14 NOTE — Telephone Encounter (Signed)
I called pt. I advised pt that Dr. Frances Furbish reviewed pt's sleep study and found that did not show any significant osa and only minimal snoring. Pt did not achieve any dream sleep and did not sleep well that night. Dr. Frances Furbish recommends that pt follow up with Dr. Lucia Gaskins and her PCP. I reviewed sleep hygiene recommendations with the pt, including trying to keep a regular sleep wake schedule, avoiding electronics in the bedroom, keeping the bedroom cool, dark, and quiet, and avoiding eating or exercising within 2 hours of bedtime as well as eating in the middle of the night. I advised pt to keep pets out of the bedroom. I discussed with pt the importance of stress relief and to try meditation, deep breathing exercises, and/or a white noise machine or fan to diffuse other noise distractors. I advised pt to not drink alcohol before bedtime and to never mix alcohol and sedating medications. Pt was advised to avoid narcotic pain medication close to bedtime. I advised pt that a copy of these sleep study results will be sent to Dr. Lucia Gaskins. Pt verbalized understanding of results. Pt had no questions at this time but was encouraged to call back if questions arise.

## 2019-01-14 NOTE — Telephone Encounter (Signed)
-----   Message from Huston Foley, MD sent at 01/14/2019  8:18 AM EDT ----- Patient referred by Dr. Lucia Gaskins, seen by me on 07/17/18, diagnostic PSG on 01/06/19.   Please call and notify the patient that the recent sleep study did not show any significant obstructive sleep apnea and only minimal snoring. She did not sleep very well, achieved no dream sleep.  Please remind patient to try to maintain good sleep hygiene, which means: Keep a regular sleep and wake schedule and make enough time for sleep (7 1/2 to 8 1/2 hours for the average adult), try not to exercise or have a meal within 2 hours of your bedtime, try to keep your bedroom conducive for sleep, that is, cool and dark, without light distractors such as an illuminated alarm clock, and refrain from watching TV right before sleep or in the middle of the night and do not keep the TV or radio on during the night. If a nightlight is used, have it away from the visual field. Also, try not to use or play on electronic devices at bedtime, such as your cell phone, tablet PC or laptop. If you like to read at bedtime on an electronic device, try to dim the background light as much as possible. Do not eat in the middle of the night. Keep pets away from the bedroom environment. For stress relief, try meditation, deep breathing exercises (there are many books and CDs available), a white noise machine or fan can help to diffuse other noise distractors, such as traffic noise. Do not drink alcohol before bedtime, as it can disturb sleep and cause middle of the night awakenings. Never mix alcohol and sedating medications! Avoid narcotic pain medication close to bedtime, as opioids/narcotics can suppress breathing drive and breathing effort.   She can FU with Dr. Lucia Gaskins as planned and PCP.   Thanks,  Huston Foley, MD, PhD [facility])

## 2019-01-14 NOTE — Progress Notes (Signed)
Patient referred by Dr. Lucia Gaskins, seen by me on 07/17/18, diagnostic PSG on 01/06/19.   Please call and notify the patient that the recent sleep study did not show any significant obstructive sleep apnea and only minimal snoring. She did not sleep very well, achieved no dream sleep.  Please remind patient to try to maintain good sleep hygiene, which means: Keep a regular sleep and wake schedule and make enough time for sleep (7 1/2 to 8 1/2 hours for the average adult), try not to exercise or have a meal within 2 hours of your bedtime, try to keep your bedroom conducive for sleep, that is, cool and dark, without light distractors such as an illuminated alarm clock, and refrain from watching TV right before sleep or in the middle of the night and do not keep the TV or radio on during the night. If a nightlight is used, have it away from the visual field. Also, try not to use or play on electronic devices at bedtime, such as your cell phone, tablet PC or laptop. If you like to read at bedtime on an electronic device, try to dim the background light as much as possible. Do not eat in the middle of the night. Keep pets away from the bedroom environment. For stress relief, try meditation, deep breathing exercises (there are many books and CDs available), a white noise machine or fan can help to diffuse other noise distractors, such as traffic noise. Do not drink alcohol before bedtime, as it can disturb sleep and cause middle of the night awakenings. Never mix alcohol and sedating medications! Avoid narcotic pain medication close to bedtime, as opioids/narcotics can suppress breathing drive and breathing effort.   She can FU with Dr. Lucia Gaskins as planned and PCP.   Thanks,  Huston Foley, MD, PhD [facility])

## 2019-01-14 NOTE — Procedures (Signed)
ventPATIENT'S NAME:  Bridget [name], Bridget [name] DOB:      17-Feb-1941      MR#:    [id number]     DATE OF RECORDING: 01/06/2019 REFERRING M.D.:  Naomie Dean, MD Study Performed:   Baseline Polysomnogram HISTORY: 78 year old woman with a history of hypertension, hyperlipidemia, diabetes, history of TIA, thyroid disease, and depression, who reports snoring and excessive daytime somnolence. The patient endorsed the Epworth Sleepiness Scale at 16 points. The patient's weight 119 pounds with a height of 62 (inches), resulting in a BMI of 21.9 kg/m2. The patient's neck circumference measured 12.5 inches.  CURRENT MEDICATIONS: Norvasc, Aspirin, Coreg, Lasix, Metformin, Nitrostat, Xarelto, Effexor, Trintellix   PROCEDURE:  This is a multichannel digital polysomnogram utilizing the Somnostar 11.2 system.  Electrodes and sensors were applied and monitored per AASM Specifications.   EEG, EOG, Chin and Limb EMG, were sampled at 200 Hz.  ECG, Snore and Nasal Pressure, Thermal Airflow, Respiratory Effort, CPAP Flow and Pressure, Oximetry was sampled at 50 Hz. Digital video and audio were recorded.      BASELINE STUDY  Lights Out was at 21:26 and Lights On at 04:59.  Total recording time (TRT) was 453.5 minutes, with a total sleep time (TST) of 159.5 minutes.   The patient's sleep latency was markedly delayed. REM sleep was absent. The sleep efficiency was 35.2%, which is markedly reduced.     SLEEP ARCHITECTURE: WASO (Wake after sleep onset) was 153 minutes with moderate to severe sleep fragmentation noted. There were 61 minutes in Stage N1, 54 minutes Stage N2, 44.5 minutes Stage N3 and 0 minutes in Stage REM.  The percentage of Stage N1 was 38.2%, which is markedly increased, Stage N2 was 33.9%, Stage N3 was 27.9% and Stage R (REM sleep) was absent. The arousals were noted as: 101 were spontaneous, 0 were associated with PLMs, 3 were associated with respiratory events.  RESPIRATORY ANALYSIS:  There were a total of 4  respiratory events:  0 obstructive apneas, 0 central apneas and 0 mixed apneas with a total of 0 apneas and an apnea index (AI) of 0 /hour. There were 4 hypopneas with a hypopnea index of 1.5 /hour. The patient also had 0 respiratory event related arousals (RERAs).      The total APNEA/HYPOPNEA INDEX (AHI) was 1.5 /hour and the total RESPIRATORY DISTURBANCE INDEX was 0. 1.5 /hour.  0 events occurred in REM sleep and 8 events in NREM. The REM AHI was 0. 0 /hour, versus a non-REM AHI of 1.5. The patient spent 72 minutes of total sleep time in the supine position and 88 minutes in non-supine.. The supine AHI was 3.3 versus a non-supine AHI of 0.0.  OXYGEN SATURATION & C02:  The Wake baseline 02 saturation was 96%, with the lowest being 87%. Time spent below 89% saturation equaled 7 minutes.  PERIODIC LIMB MOVEMENTS: The patient had a total of 0 Periodic Limb Movements.  The Periodic Limb Movement (PLM) index was 0 and the PLM Arousal index was 0/hour. Audio and video analysis did not show any abnormal or unusual movements, behaviors, phonations or vocalizations. The patient took 2 bathroom breaks. Mild intermittent snoring was noted. The EKG was in keeping with normal sinus rhythm (NSR).  Post-study, the patient indicated that sleep was worse than usual.   IMPRESSION:  1. Primary Snoring 2. Dysfunctions associated with sleep stages or arousal from sleep  RECOMMENDATIONS: 1. This study does not demonstrate any significant obstructive or central sleep disordered breathing. The absence of  REM sleep may underestimate her sleep disordered breathing. This study does not support an intrinsic sleep disorder as a cause of the patient's symptoms. Other causes, including circadian rhythm disturbances, an underlying mood disorder, medication effect and/or an underlying medical problem cannot be ruled out. 2. This study shows significant sleep fragmentation and abnormal sleep stage percentages; these are  nonspecific findings and per se do not signify an intrinsic sleep disorder or a cause for the patient's sleep-related symptoms. Causes include (but are not limited to) the first night effect of the sleep study, circadian rhythm disturbances, medication effect or an underlying mood disorder or medical problem.  3. The patient should be cautioned not to drive, work at heights, or operate dangerous or heavy equipment when tired or sleepy. Review and reiteration of good sleep hygiene measures should be pursued with any patient. 4. The patient will be advised to follow up with the referring provider, who will be notified of the test results.  I certify that I have reviewed the entire raw data recording prior to the issuance of this report in accordance with the Standards of Accreditation of the American Academy of Sleep Medicine (AASM)   Huston Foley, MD, PhD Diplomat, American Board of Neurology and Sleep Medicine (Neurology and Sleep Medicine)

## 2019-01-20 ENCOUNTER — Other Ambulatory Visit: Payer: Self-pay | Admitting: Endocrinology

## 2019-01-22 ENCOUNTER — Other Ambulatory Visit: Payer: Self-pay

## 2019-01-22 MED ORDER — ONETOUCH DELICA PLUS LANCET33G MISC: 1.0000 | Freq: Three times a day (TID) | 3 refills | Status: DC

## 2019-01-27 DIAGNOSIS — M810 Age-related osteoporosis without current pathological fracture: Secondary | ICD-10-CM | POA: Diagnosis not present

## 2019-01-27 DIAGNOSIS — M85852 Other specified disorders of bone density and structure, left thigh: Secondary | ICD-10-CM | POA: Diagnosis not present

## 2019-01-27 HISTORY — DX: Age-related osteoporosis without current pathological fracture: M81.0

## 2019-02-05 ENCOUNTER — Telehealth: Payer: Self-pay | Admitting: Endocrinology

## 2019-02-05 ENCOUNTER — Other Ambulatory Visit: Payer: Self-pay

## 2019-02-05 DIAGNOSIS — E119 Type 2 diabetes mellitus without complications: Secondary | ICD-10-CM

## 2019-02-05 MED ORDER — METFORMIN HCL 500 MG PO TABS: 500.0000 mg | ORAL_TABLET | Freq: Two times a day (BID) | ORAL | 2 refills | Status: DC

## 2019-02-05 NOTE — Telephone Encounter (Signed)
She can take metformin 500 mg twice daily with food.  If she has diarrhea with this she will have to be changed from this to Janumet XR which will be more expensive

## 2019-02-05 NOTE — Telephone Encounter (Signed)
Patient called re: patient received a letter from [facility] stating that the patient's Metformin has been recalled. Please call patient at ph# [phone] to advise.

## 2019-02-05 NOTE — Telephone Encounter (Signed)
Called pt and made her aware of new orders. Advised she call if she experiences diarrhea. Verbalized acceptance and understanding

## 2019-02-05 NOTE — Telephone Encounter (Signed)
Please advise if you wish to change Rx to Metformin and if dosage should be altered

## 2019-02-20 ENCOUNTER — Other Ambulatory Visit: Payer: Self-pay

## 2019-02-20 ENCOUNTER — Telehealth: Payer: Self-pay | Admitting: Endocrinology

## 2019-02-20 MED ORDER — JARDIANCE 10 MG PO TABS: ORAL_TABLET | ORAL | 3 refills | Status: DC

## 2019-02-20 NOTE — Telephone Encounter (Signed)
Called patient and gave her MD message. Pt verbally agreed to try medication and new Rx was sent to pharmacy.

## 2019-02-20 NOTE — Telephone Encounter (Signed)
Please confirm that she understands she will not take the 2.5 dose of the amlodipine

## 2019-02-20 NOTE — Telephone Encounter (Signed)
Pt was told and verbalized understanding that she will completely stop this medication.

## 2019-02-20 NOTE — Telephone Encounter (Signed)
We have received approval for starting her on Jardiance for her diabetes from her cardiologist.  With starting this she will need to stop the half of the 5 mg amlodipine she is taking.  We we will need to send prescription for 10 mg Jardiance once daily, please let patient know

## 2019-03-19 DIAGNOSIS — I5032 Chronic diastolic (congestive) heart failure: Secondary | ICD-10-CM | POA: Diagnosis not present

## 2019-03-19 DIAGNOSIS — Z45018 Encounter for adjustment and management of other part of cardiac pacemaker: Secondary | ICD-10-CM | POA: Diagnosis not present

## 2019-03-19 DIAGNOSIS — Z7901 Long term (current) use of anticoagulants: Secondary | ICD-10-CM | POA: Diagnosis not present

## 2019-03-19 DIAGNOSIS — I442 Atrioventricular block, complete: Secondary | ICD-10-CM | POA: Diagnosis not present

## 2019-04-03 ENCOUNTER — Ambulatory Visit: Payer: Medicare Other | Admitting: Endocrinology

## 2019-04-16 DIAGNOSIS — D631 Anemia in chronic kidney disease: Secondary | ICD-10-CM | POA: Diagnosis not present

## 2019-04-16 DIAGNOSIS — N182 Chronic kidney disease, stage 2 (mild): Secondary | ICD-10-CM | POA: Diagnosis not present

## 2019-04-18 DIAGNOSIS — F330 Major depressive disorder, recurrent, mild: Secondary | ICD-10-CM | POA: Diagnosis not present

## 2019-04-18 DIAGNOSIS — J4520 Mild intermittent asthma, uncomplicated: Secondary | ICD-10-CM | POA: Diagnosis not present

## 2019-04-18 DIAGNOSIS — M7918 Myalgia, other site: Secondary | ICD-10-CM | POA: Diagnosis not present

## 2019-04-18 DIAGNOSIS — E1121 Type 2 diabetes mellitus with diabetic nephropathy: Secondary | ICD-10-CM | POA: Diagnosis not present

## 2019-04-18 DIAGNOSIS — G458 Other transient cerebral ischemic attacks and related syndromes: Secondary | ICD-10-CM | POA: Diagnosis not present

## 2019-04-18 DIAGNOSIS — M810 Age-related osteoporosis without current pathological fracture: Secondary | ICD-10-CM | POA: Diagnosis not present

## 2019-04-18 DIAGNOSIS — Z23 Encounter for immunization: Secondary | ICD-10-CM | POA: Diagnosis not present

## 2019-04-18 DIAGNOSIS — I119 Hypertensive heart disease without heart failure: Secondary | ICD-10-CM | POA: Diagnosis not present

## 2019-04-18 DIAGNOSIS — K5904 Chronic idiopathic constipation: Secondary | ICD-10-CM | POA: Diagnosis not present

## 2019-04-18 DIAGNOSIS — E782 Mixed hyperlipidemia: Secondary | ICD-10-CM | POA: Diagnosis not present

## 2019-04-18 DIAGNOSIS — I6523 Occlusion and stenosis of bilateral carotid arteries: Secondary | ICD-10-CM | POA: Diagnosis not present

## 2019-05-05 ENCOUNTER — Other Ambulatory Visit: Payer: Self-pay | Admitting: Endocrinology

## 2019-05-05 DIAGNOSIS — E119 Type 2 diabetes mellitus without complications: Secondary | ICD-10-CM

## 2019-06-06 DIAGNOSIS — Z950 Presence of cardiac pacemaker: Secondary | ICD-10-CM | POA: Diagnosis not present

## 2019-06-09 ENCOUNTER — Other Ambulatory Visit: Payer: Self-pay | Admitting: Endocrinology

## 2019-06-09 DIAGNOSIS — E119 Type 2 diabetes mellitus without complications: Secondary | ICD-10-CM

## 2019-07-21 DIAGNOSIS — F330 Major depressive disorder, recurrent, mild: Secondary | ICD-10-CM | POA: Diagnosis not present

## 2019-07-21 DIAGNOSIS — E1121 Type 2 diabetes mellitus with diabetic nephropathy: Secondary | ICD-10-CM | POA: Diagnosis not present

## 2019-07-21 DIAGNOSIS — G458 Other transient cerebral ischemic attacks and related syndromes: Secondary | ICD-10-CM | POA: Diagnosis not present

## 2019-07-21 DIAGNOSIS — I119 Hypertensive heart disease without heart failure: Secondary | ICD-10-CM | POA: Diagnosis not present

## 2019-07-21 DIAGNOSIS — R141 Gas pain: Secondary | ICD-10-CM | POA: Diagnosis not present

## 2019-07-21 DIAGNOSIS — M810 Age-related osteoporosis without current pathological fracture: Secondary | ICD-10-CM | POA: Diagnosis not present

## 2019-07-21 DIAGNOSIS — J4520 Mild intermittent asthma, uncomplicated: Secondary | ICD-10-CM | POA: Diagnosis not present

## 2019-07-21 DIAGNOSIS — R143 Flatulence: Secondary | ICD-10-CM | POA: Diagnosis not present

## 2019-07-21 DIAGNOSIS — R142 Eructation: Secondary | ICD-10-CM | POA: Diagnosis not present

## 2019-07-21 DIAGNOSIS — E782 Mixed hyperlipidemia: Secondary | ICD-10-CM | POA: Diagnosis not present

## 2019-07-21 DIAGNOSIS — K5904 Chronic idiopathic constipation: Secondary | ICD-10-CM | POA: Diagnosis not present

## 2019-07-28 ENCOUNTER — Other Ambulatory Visit: Payer: Self-pay | Admitting: Endocrinology

## 2019-08-04 ENCOUNTER — Other Ambulatory Visit: Payer: Self-pay

## 2019-08-04 DIAGNOSIS — E119 Type 2 diabetes mellitus without complications: Secondary | ICD-10-CM

## 2019-08-04 MED ORDER — ONETOUCH ULTRA VI STRP: 1.0000 | ORAL_STRIP | Freq: Four times a day (QID) | 0 refills | Status: DC

## 2019-08-06 NOTE — Progress Notes (Signed)
Patient called to advise that [facility] can not filled RX as written for testing 4x per day.  Insurance requires documentation to support 4x versus 3x per.

## 2019-08-07 ENCOUNTER — Other Ambulatory Visit: Payer: Self-pay

## 2019-08-07 DIAGNOSIS — E119 Type 2 diabetes mellitus without complications: Secondary | ICD-10-CM

## 2019-08-07 MED ORDER — ONETOUCH DELICA PLUS LANCET33G MISC: 1.0000 | Freq: Two times a day (BID) | 3 refills | Status: DC

## 2019-08-07 MED ORDER — ONETOUCH ULTRA VI STRP: 1.0000 | ORAL_STRIP | Freq: Two times a day (BID) | 0 refills | Status: DC

## 2019-08-07 NOTE — Progress Notes (Signed)
Please advise 

## 2019-08-07 NOTE — Progress Notes (Signed)
glucose blood (ONETOUCH ULTRA) test strip 60 each 0 08/07/2019    Sig - Route: 1 each by Other route 2 (two) times daily. E11.9; Will only provide 30 day supply. Must call to schedule appt - Other   Sent to pharmacy as: glucose blood (ONETOUCH ULTRA) test strip   E-Prescribing Status: Receipt confirmed by pharmacy (08/07/2019  9:41 AM EST)

## 2019-08-07 NOTE — Progress Notes (Signed)
She only needs to test twice a day.  Also she needs to make her follow-up appointment, same day labs

## 2019-08-28 DIAGNOSIS — I442 Atrioventricular block, complete: Secondary | ICD-10-CM | POA: Diagnosis not present

## 2019-08-28 DIAGNOSIS — Z45018 Encounter for adjustment and management of other part of cardiac pacemaker: Secondary | ICD-10-CM | POA: Diagnosis not present

## 2019-09-19 DIAGNOSIS — Z45018 Encounter for adjustment and management of other part of cardiac pacemaker: Secondary | ICD-10-CM | POA: Diagnosis not present

## 2019-09-19 DIAGNOSIS — I5032 Chronic diastolic (congestive) heart failure: Secondary | ICD-10-CM | POA: Diagnosis not present

## 2019-09-19 DIAGNOSIS — I442 Atrioventricular block, complete: Secondary | ICD-10-CM | POA: Diagnosis not present

## 2019-09-19 DIAGNOSIS — Z7901 Long term (current) use of anticoagulants: Secondary | ICD-10-CM | POA: Diagnosis not present

## 2019-09-19 DIAGNOSIS — Z86718 Personal history of other venous thrombosis and embolism: Secondary | ICD-10-CM | POA: Diagnosis not present

## 2019-09-22 ENCOUNTER — Telehealth: Payer: Self-pay | Admitting: Endocrinology

## 2019-09-22 ENCOUNTER — Other Ambulatory Visit: Payer: Self-pay

## 2019-09-22 DIAGNOSIS — E119 Type 2 diabetes mellitus without complications: Secondary | ICD-10-CM

## 2019-09-22 MED ORDER — ONETOUCH ULTRA VI STRP: 1.0000 | ORAL_STRIP | Freq: Two times a day (BID) | 0 refills | Status: DC

## 2019-09-22 NOTE — Telephone Encounter (Signed)
Rx sent 

## 2019-09-22 NOTE — Telephone Encounter (Signed)
MEDICATION: one touch test strips  PHARMACY:   [facility] [address] [place] - [address] Phone:  [phone]  Fax:  [phone]     IS THIS A 90 DAY SUPPLY : patient unsure  IS PATIENT OUT OF MEDICATION: yes  IF NOT; HOW MUCH IS LEFT: 0  LAST APPOINTMENT DATE: 04/03/19  NEXT APPOINTMENT DATE: n/a  DO WE HAVE YOUR PERMISSION TO LEAVE A DETAILED MESSAGE: yes  OTHER COMMENTS:    **Let patient know to contact pharmacy at the end of the day to make sure medication is ready. **  ** Please notify patient to allow 48-72 hours to process**  **Encourage patient to contact the pharmacy for refills or they can request refills through [facility]**

## 2019-09-24 ENCOUNTER — Ambulatory Visit (INDEPENDENT_AMBULATORY_CARE_PROVIDER_SITE_OTHER): Payer: Medicare Other | Admitting: Endocrinology

## 2019-09-24 ENCOUNTER — Other Ambulatory Visit: Payer: Self-pay

## 2019-09-24 ENCOUNTER — Encounter: Payer: Self-pay | Admitting: Endocrinology

## 2019-09-24 VITALS — BP 130/64 | HR 72 | Ht 62.0 in | Wt 116.6 lb

## 2019-09-24 DIAGNOSIS — E041 Nontoxic single thyroid nodule: Secondary | ICD-10-CM

## 2019-09-24 DIAGNOSIS — E119 Type 2 diabetes mellitus without complications: Secondary | ICD-10-CM

## 2019-09-24 LAB — POCT GLYCOSYLATED HEMOGLOBIN (HGB A1C): Hemoglobin A1C: 6 % — AB (ref 4.0–5.6)

## 2019-09-24 MED ORDER — ONETOUCH ULTRA VI STRP: ORAL_STRIP | 3 refills | Status: DC

## 2019-09-24 NOTE — Progress Notes (Signed)
Patient ID: Bridget [name], female   DOB: 14-Aug-1941, 79 y.o.   MRN: [id number]   Reason for Appointment: Diabetes follow-up   History of Present Illness   Diagnosis: Type 2 DIABETES MELITUS, date of diagnosis:  2007     Previous history: Her A1c at diagnosis was 7.9% She has been treated with a combination of metformin ER and Actos for several years Usually her A1c has been upper normal and her last A1c was 5.8 in 4/14  Recent history:  Her A1c is very much at a good range at 6% as before   Blood sugars are still being checked only in the mornings and not after meals  She has difficulty getting the strips because of documentation requirements at the [facility] pharmacy  Currently only on half of maximum dose Metformin without any side effects  More recently has started doing a little walking but this is inconsistent  Her weight is about 3 pounds lower  She has had more stress issues  She says her diet has been fairly good, although not always getting protein in the morning, mostly eating meat or egg for protein  Usually she is reluctant to take any brand-name medication because of cost     Oral hypoglycemic drugs:   metformin ER 500 mg, 1 tablet 2x a day         Side effects from medications:  Abdominal discomfort from high-dose metformin       Monitors blood glucose:  once a day or less.    Glucometer: One Touch.           Blood Glucose readings from monitor download:   FASTING blood sugars range 93-165 with AVERAGE 122, lunchtime 115   PREVIOUS FASTING range 113-131, median 124 Nonfasting 100  Her previous readings:  PRE-MEAL Fasting Lunch Dinner Bedtime Overall  Glucose range:  106-136    101-184  Mean/median:  119  118   119   POST-MEAL PC Breakfast PC Lunch PC Dinner  Glucose range:  112-174 10 1-127  108, 111  Mean/median:  135          Meals: 3 meals per day. Usually follows a healthy diet, portions controlled             Dietician  visit: Most recent: 2007            Wt Readings from Last 3 Encounters:  09/24/19 116 lb 9.6 oz (52.9 kg)  01/02/19 119 lb 6.4 oz (54.2 kg)  07/17/18 119 lb (54 kg)   Lab Results  Component Value Date   HGBA1C 6.0 (A) 09/24/2019   HGBA1C 6.1 (A) 01/02/2019   HGBA1C 6.0 (A) 05/06/2018   Lab Results  Component Value Date   MICROALBUR 1.8 01/02/2019   LDLCALC 68 05/06/2018   CREATININE 0.99 01/02/2019   Other active problems discussed: See review of systems   Allergies as of 09/24/2019      Reactions   Atorvastatin Other (See Comments)   "muscle Cramps"   Budesonide Other (See Comments)   "fatigue"   Gabapentin Other (See Comments)   "fatigue"   Metoclopramide Other (See Comments)   "mayalgia"   Rosuvastatin Other (See Comments)   "muscle cramps"   Simvastatin Other (See Comments)   "muscle Cramps"   Sulfamethoxazole-trimethoprim Rash      Medication List       Accurate as of September 24, 2019  1:23 PM. If you have any questions, ask your nurse or doctor.  STOP taking these medications   Jardiance 10 MG Tabs tablet Generic drug: empagliflozin Stopped by: Reather Littler, MD     TAKE these medications   aspirin 81 MG chewable tablet Chew 81 mg by mouth daily.   carvedilol 25 MG tablet Commonly known as: COREG Take 25 mg by mouth 2 (two) times daily with a meal.   fenofibrate 160 MG tablet Take 160 mg by mouth daily.   furosemide 20 MG tablet Commonly known as: LASIX Take 20 mg by mouth daily as needed.   Magnesium 250 MG Tabs Take 250 mg by mouth daily.   metFORMIN 500 MG tablet Commonly known as: GLUCOPHAGE TAKE 1 TABLET BY MOUTH TWICE DAILY WITH A MEAL   MULTI VITAMIN DAILY PO Take by mouth daily.   nitroGLYCERIN 0.4 MG SL tablet Commonly known as: NITROSTAT Place 0.4 mg under the tongue every 5 (five) minutes as needed for chest pain.   OneTouch Delica Plus Lancet33G Misc 1 each by Other route 2 (two) times daily. DX:E11.9; WILL PROVIDE  30 DAY SUPPLY. MUST CALL TO SCHEDULE AN APPT WITH LABS PRIOR TO APPT   OneTouch Ultra test strip Generic drug: glucose blood 1 each by Other route 2 (two) times daily. E11.9;   polyethylene glycol 17 g packet Commonly known as: MIRALAX / GLYCOLAX Take 17 g by mouth daily as needed.   Rivaroxaban 15 MG Tabs tablet Commonly known as: XARELTO Take 15 mg by mouth daily.   venlafaxine 75 MG tablet Commonly known as: EFFEXOR Take 75 mg by mouth daily.   Vitamin D-3 25 MCG (1000 UT) Caps Take 1,000 Units by mouth daily.       Allergies:  Allergies  Allergen Reactions  . Atorvastatin Other (See Comments)    "muscle Cramps"  . Budesonide Other (See Comments)    "fatigue"  . Gabapentin Other (See Comments)    "fatigue"  . Metoclopramide Other (See Comments)    "mayalgia"  . Rosuvastatin Other (See Comments)    "muscle cramps"  . Simvastatin Other (See Comments)    "muscle Cramps"  . Sulfamethoxazole-Trimethoprim Rash    Past Medical History:  Diagnosis Date  . Diabetes mellitus without complication (HCC)   . Fibromyalgia   . Heart disease   . Hypertension   . Kidney disease   . Kidney stones 01/12/1983  . Major depressive disorder, recurrent, mild (HCC)   . Mixed hyperlipidemia   . 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 Thyroid disease     Past Surgical History:  Procedure Laterality Date  . ABDOMINAL HYSTERECTOMY  03/05/1998   pt does not think this was a total hysterectomy  . APPENDECTOMY  12/31/1988  . BREAST LUMPECTOMY Left 05/22/1996  . pacemaker  09/27/2016    Family History  Problem Relation Age of Onset  . High blood pressure Mother   . Diabetes Mother        no medication needed  . Kidney disease Mother   . Pneumonia Mother   . High blood pressure Father     Social History:  reports that she has never smoked. She has never used smokeless tobacco. She reports that she does not drink alcohol or use drugs.  Review of  Systems:  Hypertension:  blood pressure is treated with Coreg, amlodipine and, treated by other physicians  She does check her blood pressure at home Blood pressure usually well controlled  She has a history of CAD followed by cardiologist  Lipids: She has  had hypertriglyceridemia controlled with fenofibrate  Labs as of 04/2019 showed cholesterol of 158, LDL not available, done by PCP  Lab Results  Component Value Date   CHOL 147 05/06/2018   HDL 54 05/06/2018   LDLCALC 68 05/06/2018   LDLDIRECT 94.2 12/05/2013   TRIG 124 05/06/2018   CHOLHDL 2 09/05/2017     THYROID:  She was previously on thyroid suppression because of her long-standing benign thyroid nodule.  She has had 2 biopsies done on this previously Because of low normal TSH her 50 mcg Synthroid was stopped in 10/14 TSH has been consistently normal subsequently  Her ultrasound last showed mostly cystic nodule in the right side about 1.6 cm  Lab Results  Component Value Date   TSH 1.40 01/02/2019   TSH 1.26 09/05/2017   TSH 1.70 01/18/2017   Last foot exam: 12/2018    Examination:   BP 130/64 (BP Location: Left Arm, Patient Position: Sitting, Cuff Size: Normal)   Pulse 72   Ht 5\' 2"  (1.575 m)   Wt 116 lb 9.6 oz (52.9 kg)   SpO2 96%   BMI 21.33 kg/m   Body mass index is 21.33 kg/m.   Thyroid nodule is difficult to palpate and no clear enlargement felt in the right lobe  Left lobe not palpable No lymphadenopathy in the neck    ASSESSMENT/ PLAN:   Diabetes type 2 on metformin 500 mg twice daily only  Her blood sugars are well controlled A1c is 6%, previously 6.1 %  She was last seen in 12/2018 Her home blood sugars at home are being checked mostly fasting when these are minimally higher with average 122 but no difference compared to last year  As before she is doing well on low-dose Metformin only  Does not like to use brand-name medications because of cost However if she has higher readings  would recommend adding an SGLT2 drug  Hypertension: Well-controlled  LIPIDS: Adequately controlled, detailed report not available from PCP as of 9/20   History of thyroid nodule: She has a small stable nodule which is  difficult to palpate on the right side and likely smaller, no further ultrasound exams needed Thyroid levels will be rechecked  Follow-up in 6 months  There are no Patient Instructions on file for this visit.   Elayne Snare 09/24/2019, 1:23 PM

## 2019-10-05 ENCOUNTER — Other Ambulatory Visit: Payer: Self-pay | Admitting: Family Medicine

## 2019-10-05 DIAGNOSIS — E119 Type 2 diabetes mellitus without complications: Secondary | ICD-10-CM

## 2019-10-06 ENCOUNTER — Other Ambulatory Visit: Payer: Self-pay

## 2019-10-06 DIAGNOSIS — E119 Type 2 diabetes mellitus without complications: Secondary | ICD-10-CM

## 2019-10-06 MED ORDER — METFORMIN HCL 500 MG PO TABS: ORAL_TABLET | ORAL | 2 refills | Status: DC

## 2019-10-14 DIAGNOSIS — N182 Chronic kidney disease, stage 2 (mild): Secondary | ICD-10-CM

## 2019-10-14 DIAGNOSIS — D631 Anemia in chronic kidney disease: Secondary | ICD-10-CM

## 2019-10-14 DIAGNOSIS — N189 Chronic kidney disease, unspecified: Secondary | ICD-10-CM | POA: Diagnosis not present

## 2019-11-03 ENCOUNTER — Encounter: Payer: Self-pay | Admitting: Family Medicine

## 2019-11-03 DIAGNOSIS — Z1231 Encounter for screening mammogram for malignant neoplasm of breast: Secondary | ICD-10-CM | POA: Diagnosis not present

## 2019-11-04 ENCOUNTER — Encounter: Payer: Self-pay | Admitting: Family Medicine

## 2019-11-19 ENCOUNTER — Encounter: Payer: Self-pay | Admitting: Family Medicine

## 2019-11-19 NOTE — Progress Notes (Signed)
Established Patient Office Visit  Subjective:  Patient ID: Bridget [name], female    DOB: 02/27/1941  Age: 79 y.o. MRN: [id number]  CC:  Chief Complaint  Patient presents with  . Asthma  . Diabetes  . Hypertension  . Hyperlipidemia  . Depression    HPI Patient to be evaluated for mild intermittent asthma, uncomplicated.  She has asthma which was first diagnosed in adulthood.  She is currently on proair for asthma.  Current asthma symptoms include dysnea with moderate exertion (stairs).  She denies cough or wheezing.  Asthma triggers include weather changes and heat.    With regard to the major depressive disorder, single episode, moderate, this is a routine follow-up.  Current medications include Effexor XR.  She denies any affective symptoms, including anhedonia and sadness.  She feels venlafaxine is adequately controlling her depression.    Hypertension was first diagnosed several years ago.  Her current cardiac medication regimen includes a beta-blocker ( carvedilol ) and a calcium channel blocker ( amlodipine ).   Compliance with treatment has been good; she takes her medication as directed and maintains her diet and exercise regimen.      Type 2 diabetes mellitus with diabetic nephropathy details; Bridget [name] has type 2, non-insulin requiring diabetes, complicated by nephropathy.  Compliance with treatment has been good; she takes her medication as directed, maintains her diet and exercise regimen, follows up as directed, and is keeping a glucose diary.  Primary symptoms reported include polydipsia, polyuria.  Current meds include an oral hypoglycemic ( Glucophage ).  She reports home blood glucose readings have averaged fasting readings in the 120s mg/dL range. She checks her glucose 1-2 times per day.  In regard to preventative care, she performs foot self-exams daily.  Patient sees endocrinology, Dr. Dwyane Dee.      Pt presents with hyperlipidemia.  Current treatment includes Tricor.   Compliance with treatment has been good; she takes her medication as directed, maintains her low cholesterol diet, follows up as directed, and maintains her exercise regimen.  She denies experiencing any hypercholesterolemia related symptoms.   Past Medical History:  Diagnosis Date  . Atrioventricular block   . Diabetes mellitus without complication ([place])   . Fibromyalgia   . Heart disease   . Hypertension   . Insomnia   . Kidney disease   . Kidney stones 01/12/1983  . Major depression   . Major depressive disorder, recurrent, mild ([place])   . Mixed hyperlipidemia   . Other amnesia   . 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 Thyroid disease     Past Surgical History:  Procedure Laterality Date  . ABDOMINAL HYSTERECTOMY  03/05/1998   pt does not think this was a total hysterectomy  . APPENDECTOMY  12/31/1988  . BREAST LUMPECTOMY Left 05/22/1996  . pacemaker  09/27/2016    Family History  Problem Relation Age of Onset  . High blood pressure Mother   . Diabetes Mother        no medication needed  . Kidney disease Mother   . Pneumonia Mother   . High blood pressure Father   . Cancer Maternal Grandmother        Leukemia  . Cancer Other        Ovarian    Social History   Socioeconomic History  . Marital status: Married    Spouse name: Not on file  . Number of children: 2  . Years of education: Not on file  .  Highest education level: 9th grade  Occupational History  . Not on file  Tobacco Use  . Smoking status: Never Smoker  . Smokeless tobacco: Never Used  Substance and Sexual Activity  . Alcohol use: Never  . Drug use: Never  . Sexual activity: Not on file  Other Topics Concern  . Not on file  Social History Narrative   Lives at home with her husband   Right handed   Caffeine: mostly tea, 0-2 cups a day   Social Determinants of Health   Financial Resource Strain:   . Difficulty of Paying Living Expenses:   Food Insecurity:   . Worried  About Programme researcher, broadcasting/film/video in the Last Year:   . Barista in the Last Year:   Transportation Needs:   . Freight forwarder (Medical):   Marland Kitchen Lack of Transportation (Non-Medical):   Physical Activity:   . Days of Exercise per Week:   . Minutes of Exercise per Session:   Stress:   . Feeling of Stress :   Social Connections:   . Frequency of Communication with Friends and Family:   . Frequency of Social Gatherings with Friends and Family:   . Attends Religious Services:   . Active Member of Clubs or Organizations:   . Attends Banker Meetings:   Marland Kitchen Marital Status:   Intimate Partner Violence:   . Fear of Current or Ex-Partner:   . Emotionally Abused:   Marland Kitchen Physically Abused:   . Sexually Abused:     Outpatient Medications Prior to Visit  Medication Sig Dispense Refill  . aspirin 81 MG chewable tablet Chew 81 mg by mouth daily.    . carvedilol (COREG) 25 MG tablet Take 25 mg by mouth 2 (two) times daily with a meal.    . Cholecalciferol (VITAMIN D-3) 1000 UNITS CAPS Take 1,000 Units by mouth daily.    . fenofibrate 160 MG tablet Take 160 mg by mouth daily.    . furosemide (LASIX) 20 MG tablet Take 20 mg by mouth daily as needed.     Marland Kitchen glucose blood (ONETOUCH ULTRA) test strip Use onetouch ultra test strips as instructed to check blood sugar once daily. DX:E11.9 50 each 3  . Lancets (ONETOUCH DELICA PLUS LANCET33G) MISC 1 each by Other route 2 (two) times daily. DX:E11.9; WILL PROVIDE 30 DAY SUPPLY. MUST CALL TO SCHEDULE AN APPT WITH LABS PRIOR TO APPT 100 each 3  . Magnesium 250 MG TABS Take 250 mg by mouth daily.    . metFORMIN (GLUCOPHAGE) 500 MG tablet TAKE 1 TABLET BY MOUTH TWICE DAILY WITH MORNING AND EVENING MEALS 180 tablet 1  . Multiple Vitamin (MULTI VITAMIN DAILY PO) Take by mouth daily.    . nitroGLYCERIN (NITROSTAT) 0.4 MG SL tablet Place 0.4 mg under the tongue every 5 (five) minutes as needed for chest pain.    . polyethylene glycol (MIRALAX / GLYCOLAX)  packet Take 17 g by mouth daily as needed.    . Rivaroxaban (XARELTO) 15 MG TABS tablet Take 15 mg by mouth daily.    Marland Kitchen venlafaxine (EFFEXOR) 75 MG tablet Take 75 mg by mouth daily.    . metFORMIN (GLUCOPHAGE) 500 MG tablet TAKE 1 TABLET BY MOUTH TWICE DAILY WITH A MEAL 60 tablet 2   No facility-administered medications prior to visit.    Allergies  Allergen Reactions  . Atorvastatin Other (See Comments)    "muscle Cramps"  . Budesonide Other (See Comments)    "  fatigue"  . Gabapentin Other (See Comments)    "fatigue"  . Metoclopramide Other (See Comments)    "mayalgia"  . Rosuvastatin Other (See Comments)    "muscle cramps"  . Simvastatin Other (See Comments)    "muscle Cramps"  . Sulfamethoxazole-Trimethoprim Rash    ROS Review of Systems  Constitutional: Positive for diaphoresis (night sweats. Has had intermittently but has worsened recently. ). Negative for chills, fatigue and fever.  HENT: Positive for rhinorrhea. Negative for congestion, ear pain and sore throat.   Respiratory: Positive for shortness of breath (more with exertion, but also at rest. Denies chest pain. Patient is using proair 1-2 times a day. No allergy medicines. ). Negative for cough.   Cardiovascular: Negative for chest pain.  Gastrointestinal: Positive for constipation (Using preparation H to help stool to pass.  Has tried adding fiber, but no help.  Alternates with diarrhea. ) and diarrhea. Negative for abdominal pain, nausea and vomiting.  Endocrine: Positive for polyphagia and polyuria. Negative for polydipsia.  Genitourinary: Negative for dysuria and urgency.  Musculoskeletal: Negative for myalgias.       Low back all the way across. Usually goes along with constipation.  Neurological: Positive for numbness (bl hands (rt >rt) ). Negative for dizziness, weakness, light-headedness and headaches.  Psychiatric/Behavioral: Negative for dysphoric mood. The patient is not nervous/anxious.       Objective:     Physical Exam  Constitutional: She appears well-developed and well-nourished.  Cardiovascular: Normal rate, regular rhythm and normal heart sounds.  Pulmonary/Chest: Effort normal and breath sounds normal.  Neurological: She is alert.  Psychiatric: She has a normal mood and affect. Her behavior is normal.    BP 122/66 (BP Location: Left Arm, Patient Position: Sitting)   Pulse 78   Temp (!) 96.4 F (35.8 C) (Temporal)   Ht 5\' 2"  (1.575 m)   Wt 117 lb (53.1 kg)   SpO2 98%   BMI 21.40 kg/m  Wt Readings from Last 3 Encounters:  11/20/19 117 lb (53.1 kg)  09/24/19 116 lb 9.6 oz (52.9 kg)  01/02/19 119 lb 6.4 oz (54.2 kg)     Health Maintenance Due  Topic Date Due  . OPHTHALMOLOGY EXAM  07/09/2018  . URINE MICROALBUMIN  01/02/2020    There are no preventive care reminders to display for this patient.  Lab Results  Component Value Date   TSH 1.40 01/02/2019   No results found for: WBC, HGB, HCT, MCV, PLT Lab Results  Component Value Date   NA 140 01/02/2019   K 4.0 01/02/2019   CO2 26 01/02/2019   GLUCOSE 110 (H) 01/02/2019   BUN 22 01/02/2019   CREATININE 0.99 01/02/2019   BILITOT 0.5 01/02/2019   ALKPHOS 45 01/02/2019   AST 29 01/02/2019   ALT 15 01/02/2019   PROT 6.2 01/02/2019   ALBUMIN 3.6 01/02/2019   CALCIUM 8.9 01/02/2019   GFR 54.56 (L) 01/02/2019   Lab Results  Component Value Date   CHOL 147 05/06/2018   Lab Results  Component Value Date   HDL 54 05/06/2018   Lab Results  Component Value Date   LDLCALC 68 05/06/2018   Lab Results  Component Value Date   TRIG 124 05/06/2018   Lab Results  Component Value Date   CHOLHDL 2 09/05/2017   Lab Results  Component Value Date   HGBA1C 6.0 (A) 09/24/2019      Assessment & Plan:  1. Mixed hyperlipidemia Well controlled.  No changes to medicines.  Continue to work on eating a healthy diet and exercise.  Labs drawn today.  - Lipid panel  2. Diabetic glomerulopathy (HCC) Control:  great Recommend check sugars fasting daily. Recommend check feet daily. Recommend annual eye exams. Medicines: none Continue to work on eating a healthy diet and exercise.  Labs drawn today.    3. Essential hypertension Well controlled.  No changes to medicines.  Continue to work on eating a healthy diet and exercise.  Labs drawn today.  - Comprehensive metabolic panel - CBC with Differential/Platelet  4. Diarrhea, unspecified type Recommend call for appt with Dr. Chales Abrahams  5. Other constipation Recommend call for appt with Dr. Chales Abrahams  6. Night sweats Order 5HIAA  7. Adhesive capsulitis of right shoulder Recommend follow up with orthopedic  8. At moderate risk for fall Recommend restart physical therapy exercises she learned previously.   1. Mixed hyperlipidemia Well controlled.  No changes to medicines.  Continue to work on eating a healthy diet and exercise.  Labs drawn today.  - Lipid panel  2. Diabetic glomerulopathy (HCC) Control: great Recommend check sugars fasting daily. Recommend check feet daily. Recommend annual eye exams. Medicines: none Continue to work on eating a healthy diet and exercise.  Labs drawn today.    3. Essential hypertension Well controlled.  No changes to medicines.  Continue to work on eating a healthy diet and exercise.  Labs drawn today.  - Comprehensive metabolic panel - CBC with Differential/Platelet  4. Diarrhea, unspecified type Recommend call for appt with Dr. Chales Abrahams  5. Other constipation Recommend call for appt with Dr. Chales Abrahams  6. Night sweats Order 5HIAA  7. Adhesive capsulitis of right shoulder Recommend follow up with orthopedic  8. At moderate risk for fall Recommend restart physical therapy exercises she learned previously.   9. Asthma, mild, intermittent. Recommend continue use of proair.  Recommend START ON SINGULAIR 10 MG ONCE DAILY. Follow-up: Return in about 6 months (around 05/21/2020).    Blane Ohara, MD

## 2019-11-20 ENCOUNTER — Other Ambulatory Visit: Payer: Self-pay

## 2019-11-20 ENCOUNTER — Encounter: Payer: Self-pay | Admitting: Family Medicine

## 2019-11-20 ENCOUNTER — Ambulatory Visit (INDEPENDENT_AMBULATORY_CARE_PROVIDER_SITE_OTHER): Payer: Medicare Other | Admitting: Family Medicine

## 2019-11-20 VITALS — BP 122/66 | HR 78 | Temp 96.4°F | Ht 62.0 in | Wt 117.0 lb

## 2019-11-20 DIAGNOSIS — K5909 Other constipation: Secondary | ICD-10-CM

## 2019-11-20 DIAGNOSIS — Z9181 History of falling: Secondary | ICD-10-CM

## 2019-11-20 DIAGNOSIS — E1121 Type 2 diabetes mellitus with diabetic nephropathy: Secondary | ICD-10-CM

## 2019-11-20 DIAGNOSIS — E782 Mixed hyperlipidemia: Secondary | ICD-10-CM

## 2019-11-20 DIAGNOSIS — R61 Generalized hyperhidrosis: Secondary | ICD-10-CM | POA: Diagnosis not present

## 2019-11-20 DIAGNOSIS — R197 Diarrhea, unspecified: Secondary | ICD-10-CM

## 2019-11-20 DIAGNOSIS — M7501 Adhesive capsulitis of right shoulder: Secondary | ICD-10-CM | POA: Diagnosis not present

## 2019-11-20 DIAGNOSIS — I10 Essential (primary) hypertension: Secondary | ICD-10-CM | POA: Diagnosis not present

## 2019-11-20 NOTE — Patient Instructions (Addendum)
1. Mixed hyperlipidemia Well controlled.  No changes to medicines.  Continue to work on eating a healthy diet and exercise.  Labs drawn today.  - Lipid panel  2. Diabetic glomerulopathy (HCC) Control: great Recommend check sugars fasting daily. Recommend check feet daily. Recommend annual eye exams. Medicines: none Continue to work on eating a healthy diet and exercise.  Labs drawn today.   3. Essential hypertension Well controlled.  No changes to medicines.  Continue to work on eating a healthy diet and exercise.  Labs drawn today.  - Comprehensive metabolic panel - CBC with Differential/Platelet 4. Diarrhea, unspecified type/constipation Recommend call for appt with Dr. Chales Abrahams 5. Night sweats Order 5HIAA 7. Adhesive capsulitis of right shoulder (Frozen shoulder) Recommend follow up with orthopedic 8. At moderate risk for fall Recommend restart physical therapy exercises she learned previously.  9. Asthma, mild, intermittent. Recommend continue use of proair.  Recommend START ON SINGULAIR 10 MG ONCE DAILY.

## 2019-11-21 LAB — COMPREHENSIVE METABOLIC PANEL
ALT: 19 IU/L (ref 0–32)
AST: 33 IU/L (ref 0–40)
Albumin/Globulin Ratio: 1.8 (ref 1.2–2.2)
Albumin: 3.8 g/dL (ref 3.7–4.7)
Alkaline Phosphatase: 56 IU/L (ref 39–117)
BUN/Creatinine Ratio: 22 (ref 12–28)
BUN: 21 mg/dL (ref 8–27)
Bilirubin Total: 0.4 mg/dL (ref 0.0–1.2)
CO2: 22 mmol/L (ref 20–29)
Calcium: 9.3 mg/dL (ref 8.7–10.3)
Chloride: 103 mmol/L (ref 96–106)
Creatinine, Ser: 0.97 mg/dL (ref 0.57–1.00)
GFR calc Af Amer: 66 mL/min/{1.73_m2} (ref >59)
GFR calc non Af Amer: 57 mL/min/{1.73_m2} — ABNORMAL LOW (ref >59)
Globulin, Total: 2.1 g/dL (ref 1.5–4.5)
Glucose: 117 mg/dL — ABNORMAL HIGH (ref 65–99)
Potassium: 4.1 mmol/L (ref 3.5–5.2)
Sodium: 141 mmol/L (ref 134–144)
Total Protein: 5.9 g/dL — ABNORMAL LOW (ref 6.0–8.5)

## 2019-11-21 LAB — CBC WITH DIFFERENTIAL/PLATELET
Basophils Absolute: 0.1 10*3/uL (ref 0.0–0.2)
Basos: 1 %
EOS (ABSOLUTE): 0.4 10*3/uL (ref 0.0–0.4)
Eos: 6 %
Hematocrit: 36.1 % (ref 34.0–46.6)
Hemoglobin: 11.8 g/dL (ref 11.1–15.9)
Immature Grans (Abs): 0 10*3/uL (ref 0.0–0.1)
Immature Granulocytes: 1 %
Lymphocytes Absolute: 1.4 10*3/uL (ref 0.7–3.1)
Lymphs: 21 %
MCH: 29.2 pg (ref 26.6–33.0)
MCHC: 32.7 g/dL (ref 31.5–35.7)
MCV: 89 fL (ref 79–97)
Monocytes Absolute: 0.5 10*3/uL (ref 0.1–0.9)
Monocytes: 7 %
Neutrophils Absolute: 4.4 10*3/uL (ref 1.4–7.0)
Neutrophils: 64 %
Platelets: 211 10*3/uL (ref 150–450)
RBC: 4.04 x10E6/uL (ref 3.77–5.28)
RDW: 12.6 % (ref 11.7–15.4)
WBC: 6.9 10*3/uL (ref 3.4–10.8)

## 2019-11-21 LAB — HEMOGLOBIN A1C
Est. average glucose Bld gHb Est-mCnc: 134 mg/dL
Hgb A1c MFr Bld: 6.3 % — ABNORMAL HIGH (ref 4.8–5.6)

## 2019-11-21 LAB — LIPID PANEL
Chol/HDL Ratio: 2.5 ratio (ref 0.0–4.4)
Cholesterol, Total: 142 mg/dL (ref 100–199)
HDL: 56 mg/dL (ref >39)
LDL Chol Calc (NIH): 66 mg/dL (ref 0–99)
Triglycerides: 113 mg/dL (ref 0–149)
VLDL Cholesterol Cal: 20 mg/dL (ref 5–40)

## 2019-11-21 LAB — CARDIOVASCULAR RISK ASSESSMENT

## 2019-11-26 ENCOUNTER — Telehealth: Payer: Self-pay | Admitting: Family Medicine

## 2019-11-26 NOTE — Progress Notes (Signed)
  Chronic Care Management   Note  11/26/2019 Name: Bridget [name] MRN: [id number] DOB: 02-01-1941  Bridget [name] is a 79 y.o. year old female who is a primary care patient of Cox, Kirsten, MD. I reached out to Janee Morn by phone today in response to a referral sent by Bridget [name]'s PCP, Cox, Kirsten, MD.   Bridget [name] was given information about Chronic Care Management services today including:  1. CCM service includes personalized support from designated clinical staff supervised by her physician, including individualized plan of care and coordination with other care providers 2. 24/7 contact phone numbers for assistance for urgent and routine care needs. 3. Service will only be billed when office clinical staff spend 20 minutes or more in a month to coordinate care. 4. Only one practitioner may furnish and bill the service in a calendar month. 5. The patient may stop CCM services at any time (effective at the end of the month) by phone call to the office staff.   Patient agreed to services and verbal consent obtained.   Follow up plan:   Bridget [name] Upstream Scheduler

## 2019-11-27 DIAGNOSIS — Z950 Presence of cardiac pacemaker: Secondary | ICD-10-CM | POA: Diagnosis not present

## 2019-12-06 ENCOUNTER — Other Ambulatory Visit: Payer: Self-pay | Admitting: Family Medicine

## 2019-12-09 ENCOUNTER — Other Ambulatory Visit: Payer: Self-pay | Admitting: Family Medicine

## 2019-12-15 NOTE — Chronic Care Management (AMB) (Signed)
Chronic Care Management Pharmacy  Name: Bridget [name]  MRN: [id number] DOB: [birth year]  Chief Complaint/ HPI  Bridget [name],  79 y.o. , female presents for their Initial CCM visit with the clinical pharmacist via telephone due to COVID-19 Pandemic.  PCP : [name], MD  Their chronic conditions include: HTN, TIA, DM, Thyroid, HLD, Constipation.   Office Visits: 11/20/2019 - start in singulair 10 mg daily for mild intermittent asthma.  07/21/2019 - Albuterol for SOB. Zenpep for flatulence. CBC normal, kidney and liver function normal.   Consult Visit: 09/24/2019 - Dr. Dwyane Dee - stop Jardiance. 09/19/2019 - Cardiology - recommended d/c of xarelto but neurologist said no due to "mini strokes".  08/28/2019 - Pacemaker check.   Medications: Outpatient Encounter Medications as of 12/17/2019  Medication Sig   aspirin 81 MG chewable tablet Chew 81 mg by mouth in the morning.    carvedilol (COREG) 25 MG tablet Take 25 mg by mouth 2 (two) times daily with a meal.   Cholecalciferol (VITAMIN D-3) 1000 UNITS CAPS Take 1,000 Units by mouth daily.   fenofibrate 160 MG tablet Take 1 tablet by mouth once daily   furosemide (LASIX) 20 MG tablet Take 20 mg by mouth daily as needed.    glucose blood (ONETOUCH ULTRA) test strip Use onetouch ultra test strips as instructed to check blood sugar once daily. DX:E11.9   Lancets (ONETOUCH DELICA PLUS STMHDQ22W) MISC 1 each by Other route 2 (two) times daily. DX:E11.9; WILL PROVIDE 30 DAY SUPPLY. MUST CALL TO SCHEDULE AN APPT WITH LABS PRIOR TO APPT   Magnesium 250 MG TABS Take 250 mg by mouth daily.   metFORMIN (GLUCOPHAGE) 500 MG tablet TAKE 1 TABLET BY MOUTH TWICE DAILY WITH MORNING AND EVENING MEALS   Multiple Vitamin (MULTI VITAMIN DAILY PO) Take by mouth daily.   nitroGLYCERIN (NITROSTAT) 0.4 MG SL tablet Place 0.4 mg under the tongue every 5 (five) minutes as needed for chest pain.   Rivaroxaban (XARELTO) 15 MG TABS tablet  Take 15 mg by mouth daily.   venlafaxine XR (EFFEXOR-XR) 75 MG 24 hr capsule Take 1 capsule by mouth once daily   polyethylene glycol (MIRALAX / GLYCOLAX) packet Take 17 g by mouth daily as needed.   venlafaxine (EFFEXOR) 75 MG tablet Take 75 mg by mouth daily.   No facility-administered encounter medications on file as of 12/17/2019.   Allergies  Allergen Reactions   Atorvastatin Other (See Comments)    "muscle Cramps"   Budesonide Other (See Comments)    "fatigue"   Gabapentin Other (See Comments)    "fatigue"   Metoclopramide Other (See Comments)    "mayalgia"   Rosuvastatin Other (See Comments)    "muscle cramps"   Simvastatin Other (See Comments)    "muscle Cramps"   Sulfamethoxazole-Trimethoprim Rash    SDOH Screenings   Alcohol Screen:    Last Alcohol Screening Score (AUDIT):   Depression (PHQ2-9): Low Risk    PHQ-2 Score: 1  Financial Resource Strain:    Difficulty of Paying Living Expenses:   Food Insecurity:    Worried About Charity fundraiser in the Last Year:    Arboriculturist in the Last Year:   Housing: Low Risk    Last Housing Risk Score: 0  Physical Activity:    Days of Exercise per Week:    Minutes of Exercise per Session:   Social Connections:    Frequency of Communication with Friends and Family:  Frequency of Social Gatherings with Friends and Family:    Attends Religious Services:    Active Member of Clubs or Organizations:    Attends Music therapist:    Marital Status:   Stress:    Feeling of Stress :   Tobacco Use: Low Risk    Smoking Tobacco Use: Never Smoker   Smokeless Tobacco Use: Never Used  Transportation Needs:    Film/video editor (Medical):    Lack of Transportation (Non-Medical):      Current Diagnosis/Assessment:  Goals Addressed            This [place] - DM       CARE PLAN ENTRY (see longitudinal plan of care for additional care plan  information)  Current Barriers:   Diabetes: non-insulin dependent, type 2; complicated by chronic medical conditions including Hypertension and hyperlipidemia, Lab Results  Component Value Date   HGBA1C 6.3 (H) 11/20/2019    Lab Results  Component Value Date   CREATININE 0.97 11/20/2019   CREATININE 0.99 01/02/2019   CREATININE 1.13 01/01/2018     No results found for: EGFR  Current antihyperglycemic regimen: fenofibrate  Denies hypoglycemic symptoms, including dizziness, lightheadedness, shaking, sweating  Denies hyperglycemic symptoms, including polyuria, polydipsia, polyphagia, nocturia, blurred vision, neuropathy  Current exercise: stays active doing housework and yardwork. Walks some with husband in neighborhood  Current blood glucose readings: 90, 131, 174  Cardiovascular risk reduction: o Current hypertensive regimen:carvedilol o Current hyperlipidemia regimen: fenofibrate o Current antiplatelet regimen:xarelto and aspirin 81 mg   Pharmacist Clinical Goal(s):   Over the next 90 days, patient will work with PharmD and primary care provider to maintain a1c of <7%.   Interventions:  Comprehensive medication review performed, medication list updated in electronic medical record  Inter-disciplinary care team collaboration (see longitudinal plan of care)  Discussed importance of healthy diet and exercise to maintain goal a1c.   Patient Self Care Activities:   Patient will check blood glucose daily , document, and provide at future appointments  Patient will focus on medication adherence by continue to use pill box.  Patient will take medications as prescribed  Patient will contact provider with any episodes of hypoglycemia  Patient will report any questions or concerns to provider   Initial goal documentation      [place] (see longitudinal plan of care for additional care plan information)  Current Barriers:     Chronic disease management support, education, and care coordination needs related to Diabetes, hypertension, hyperlipidemia.   Current antihypertensive regimen: carvedilol 25 mg bid  Previous antihypertensives tried: lisinopril, amlodipine, metoprolol, lisinopril-hctz  Last practice recorded BP readings:  BP Readings from Last 3 Encounters:  11/20/19 122/66  09/24/19 130/64  01/02/19 130/64     Current home BP readings: 104/64, 137/73, 127/69  Most recent eGFR/CrCl: No results found for: EGFR  No components found for: CRCL  Pharmacist Clinical Goal(s):   Over the next 90 days, patient will work with PharmD to maintain blood pressure of <130/80 mmHg  Interventions:  Inter-disciplinary care team collaboration (see longitudinal plan of care)  Comprehensive medication review performed; medication list updated in the electronic medical record.   Discussed importance of diet and exercise on blood pressure.   Patient Self Care Activities:   Patient will continue to check BP daily, document, and provide at future appointments  Patient will focus on medication  adherence by continuing to use weekly pill box.  Patient verbalizes understanding of plan to follow as described above, self administer medication as prescribed, calls pharmacy for refills and calls providers office for new concerns or questions.  Initial goal documentation        Diabetes   Recent Relevant Labs: Lab Results  Component Value Date/Time   HGBA1C 6.3 (H) 11/20/2019 09:31 AM   HGBA1C 6.0 (A) 09/24/2019 01:20 PM   HGBA1C 6.1 (A) 01/02/2019 09:15 AM   HGBA1C 5.6 02/29/2016 10:11 AM   MICROALBUR 1.8 01/02/2019 09:32 AM   MICROALBUR <0.7 01/01/2018 01:48 PM     Checking BG: Daily  Recent FBG Readings: 131, 90. 174 was the highest she has seen lately. She thinks it changes when she eats out.  Patient has failed these meds in past: Jardiance 10 mg, pioglitazone 30 mg Patient is currently  controlled on the following medications: metformin 500 mg bid, one touch ultra test strips, lancets  Last diabetic Foot exam: 11/20/2019   Last diabetic Eye exam:  Appointment set in June 2021  We discussed: diet and exercise extensively. Patient was eating good when she cooks at home. Lots of meat and vegetables. She hasn't felt as good in the past week and hasn't cooked as much at home. Patient loves sweets.   Plan  Continue current medications  Hypertension   BP today is:  <130/80  Office blood pressures are  BP Readings from Last 3 Encounters:  11/20/19 122/66  09/24/19 130/64  01/02/19 130/64    Patient has failed these meds in the past: amlodipine 5 mg, lisinopril 10 mg, lisinopril-hctz 20-12.5 mg, metoprolol succinate 25 mg  Patient is currently controlled on the following medications: carvedilol 25 mg bid, furosemide 20 mg daily prn  Patient checks BP at home daily  Patient home BP readings are ranging: 104/64, 137/73, 127/69  We discussed diet and exercise extensively. Had a pacemaker 09/27/2016. Most exercise is in her house. Her legs get tired walking up the hill in her neighborhood. She stays active walking stairs in her home.   Plan  Continue current medications   Hx of DVT   Patient has failed these meds in past: n/a Patient is currently controlled on the following medications: Xarelto 15 mg daily  We discussed:  Had a blood clot when the pacemaker was put in. She was started on Xarelto at that time. She takes it even though she is outside of the treatment window. She felt more comfortable staying on Xarelto. She has lots of bruising. She does enter the doughnut hole at the end of the year but doesn't think she would qualify for patient assistance. Pharmacist will provide samples if available at the end of this year to avoid doughnut hole pricing.   Plan  Continue current medications   Hyperlipidemia   Lipid Panel     Component Value Date/Time   CHOL  142 11/20/2019 0931   TRIG 113 11/20/2019 0931   HDL 56 11/20/2019 0931   CHOLHDL 2.5 11/20/2019 0931   CHOLHDL 2 09/05/2017 0859   VLDL 21.2 09/05/2017 0859   LDLCALC 66 11/20/2019 0931   LDLDIRECT 94.2 12/05/2013 1713   LABVLDL 20 11/20/2019 0931     The 10-year ASCVD risk score (Goff DC Jr., et al., 2013) is: 36.7%   Values used to calculate the score:     Age: 26 years     Sex: Female     Is Non-Hispanic African American: No  Diabetic: Yes     Tobacco smoker: No     Systolic Blood Pressure: 962 mmHg     Is BP treated: Yes     HDL Cholesterol: 56 mg/dL     Total Cholesterol: 142 mg/dL   Patient has failed these meds in past: n/a Patient is currently controlled on the following medications: aspirin 81 mg daily, fenofibrate 160 mg daily  We discussed:  diet and exercise extensively  Plan  Continue current medications   COPD / Asthma / Tobacco   Eosinophil count:  No results found for: EOSPCT%                               Eos (Absolute):  Lab Results  Component Value Date/Time   EOSABS 0.4 11/20/2019 09:31 AM    Tobacco Status:  Social History   Tobacco Use  Smoking Status Never Smoker  Smokeless Tobacco Never Used    Patient has failed these meds in past: n/a Patient is currently uncontrolled on the following medications: proair Using maintenance inhaler regularly? No Frequency of rescue inhaler use:  several times per month  We discussed:  Patient talked with Dr. Tobie Poet about Singulair at last visit. She has not started anything yet and pharmacy does not have script. Patient wants to make sure that the Singulair won't do anything to her blood count/kidney issue. Dr. Tobie Poet ordered medication for patient and pharmacist let patient know that it was at the pharmacy. Patient will begin and pharmacist will call next month to monitor efficacy. Patient indicates that pollen season makes breathing condition worse. .   Plan  Continue current  medications   Depression   Patient has failed these meds in past: trintellix 10 mg, sertraline 100 mg Patient is currently controlled on the following medications: venlafaxine xr 75 mg daily   We discussed: Medicine is working pretty good. Works about as good as anything she has tried.   Plan  Continue current medications   Health Maintenance   Patient is currently controlled on the following medications:  -polyethylene glycol daily prn - constipation - Vitamin D3 1000 daily - general health - Magnesium 250 mg daily - general health - Multivitamin daily - general health  We discussed:  Patient's symptoms of constipation are well managed right now and not using miralax. The gas/flatulence issue comes and goes with no specific trigger. She was unable to afford the Zenpep that Dr. Tobie Poet recommended in December. Have provided her with 2 weeks of samples to try for now. If she has success trying those, we can determine a plan for improving affordability/access.   Plan  Continue current medications   Vaccines   Reviewed and discussed patient's vaccination history.  Has had both COVID vaccines and complete 09/19/2019.  Immunization History  Administered Date(s) Administered   Influenza, High Dose Seasonal PF 06/05/2017   Influenza,trivalent, recombinat, inj, PF 05/14/2012   Influenza-Unspecified 07/02/2014, 04/13/2015, 04/24/2016, 06/14/2018   Pneumococcal Conjugate-13 04/27/2014   Pneumococcal Polysaccharide-23 07/14/2010, 07/14/2010, 12/02/2015   Tdap 11/01/2017    Plan  Recommended patient receive annual flu vaccine in office.   Medication Management   Pt uses [facility] for all medications Uses pill box? Yes Pt endorses good compliance rarely misses medications.   We discussed: She gets her medicine box out each morning.   Plan  Continue current medication management strategy    Follow up: 1 month phone visit

## 2019-12-17 ENCOUNTER — Ambulatory Visit: Payer: Medicare Other

## 2019-12-17 ENCOUNTER — Other Ambulatory Visit: Payer: Self-pay | Admitting: Family Medicine

## 2019-12-17 ENCOUNTER — Other Ambulatory Visit: Payer: Self-pay

## 2019-12-17 DIAGNOSIS — E1121 Type 2 diabetes mellitus with diabetic nephropathy: Secondary | ICD-10-CM

## 2019-12-17 DIAGNOSIS — I10 Essential (primary) hypertension: Secondary | ICD-10-CM

## 2019-12-17 MED ORDER — MONTELUKAST SODIUM 10 MG PO TABS: 10.0000 mg | ORAL_TABLET | Freq: Every day | ORAL | 11 refills | Status: DC

## 2019-12-17 NOTE — Patient Instructions (Addendum)
Visit Information  Thank you for your time discussing your medications. I look forward to working with you to achieve your health care goals. Below is a summary of what we talked about during our visit.   Goals Addressed            This Visit's Progress   . Pharmacy Care Plan - DM       CARE PLAN ENTRY (see longitudinal plan of care for additional care plan information)  Current Barriers:  . Diabetes: non-insulin dependent, type 2; complicated by chronic medical conditions including Hypertension and hyperlipidemia, Lab Results  Component Value Date   HGBA1C 6.3 (H) 11/20/2019 .   Lab Results  Component Value Date   CREATININE 0.97 11/20/2019   CREATININE 0.99 01/02/2019   CREATININE 1.13 01/01/2018 .   Marland Kitchen No results found for: EGFR . Current antihyperglycemic regimen: fenofibrate . Denies hypoglycemic symptoms, including dizziness, lightheadedness, shaking, sweating . Denies hyperglycemic symptoms, including polyuria, polydipsia, polyphagia, nocturia, blurred vision, neuropathy . Current exercise: stays active doing housework and yardwork. Walks some with husband in neighborhood . Current blood glucose readings: 90, 131, 174 . Cardiovascular risk reduction: o Current hypertensive regimen:carvedilol o Current hyperlipidemia regimen: fenofibrate o Current antiplatelet regimen:xarelto and aspirin 81 mg   Pharmacist Clinical Goal(s):  Marland Kitchen Over the next 90 days, patient will work with PharmD and primary care provider to maintain a1c of <7%.   Interventions: . Comprehensive medication review performed, medication list updated in electronic medical record . Inter-disciplinary care team collaboration (see longitudinal plan of care) . Discussed importance of healthy diet and exercise to maintain goal a1c.   Patient Self Care Activities:  . Patient will check blood glucose daily , document, and provide at future appointments . Patient will focus on medication adherence by continue  to use pill box. . Patient will take medications as prescribed . Patient will contact provider with any episodes of hypoglycemia . Patient will report any questions or concerns to provider   Initial goal documentation     . Pharmacy Care Plan - HTN       CARE PLAN ENTRY (see longitudinal plan of care for additional care plan information)  Current Barriers:  . Chronic disease management support, education, and care coordination needs related to Diabetes, hypertension, hyperlipidemia.  . Current antihypertensive regimen: carvedilol 25 mg bid . Previous antihypertensives tried: lisinopril, amlodipine, metoprolol, lisinopril-hctz . Last practice recorded BP readings:  BP Readings from Last 3 Encounters:  11/20/19 122/66  09/24/19 130/64  01/02/19 130/64 .   Marland Kitchen Current home BP readings: 104/64, 137/73, 127/69 . Most recent eGFR/CrCl: No results found for: EGFR  No components found for: CRCL  Pharmacist Clinical Goal(s):  Marland Kitchen Over the next 90 days, patient will work with PharmD to maintain blood pressure of <130/80 mmHg  Interventions: . Inter-disciplinary care team collaboration (see longitudinal plan of care) . Comprehensive medication review performed; medication list updated in the electronic medical record.  . Discussed importance of diet and exercise on blood pressure.   Patient Self Care Activities:  . Patient will continue to check BP daily, document, and provide at future appointments . Patient will focus on medication adherence by continuing to use weekly pill box. . Patient verbalizes understanding of plan to follow as described above, self administer medication as prescribed, calls pharmacy for refills and calls providers office for new concerns or questions.  Initial goal documentation        Bridget [name] was given information about Chronic Care  Management services today including:  1. CCM service includes personalized support from designated clinical staff supervised  by her physician, including individualized plan of care and coordination with other care providers 2. 24/7 contact phone numbers for assistance for urgent and routine care needs. 3. Standard insurance, coinsurance, copays and deductibles apply for chronic care management only during months in which we provide at least 20 minutes of these services. Most insurances cover these services at 100%, however patients may be responsible for any copay, coinsurance and/or deductible if applicable. This service may help you avoid the need for more expensive face-to-face services. 4. Only one practitioner may furnish and bill the service in a calendar month. 5. The patient may stop CCM services at any time (effective at the end of the month) by phone call to the office staff.  Patient agreed to services and verbal consent obtained.   The patient verbalized understanding of instructions provided today and agreed to receive a mailed copy of patient instruction and/or educational materials. Telephone follow up appointment with pharmacy team member scheduled for: June 2021  Sherre Poot, PharmD Clinical Pharmacist [facility] [phone]  Exercises To Do While Sitting  Exercises that you do while sitting (chair exercises) can give you many of the same benefits as full exercise. Benefits include strengthening your heart, burning calories, and keeping muscles and joints healthy. Exercise can also improve your mood and help with depression and anxiety. You may benefit from chair exercises if you are unable to do standing exercises because of:  Diabetic foot pain.  Obesity.  Illness.  Arthritis.  Recovery from surgery or injury.  Breathing problems.  Balance problems.  Another type of disability. Before starting chair exercises, check with your health care provider or a physical therapist to find out how much exercise you can tolerate and which exercises are safe for you. If your health  care provider approves:  Start out slowly and build up over time. Aim to work up to about 10-20 minutes for each exercise session.  Make exercise part of your daily routine.  Drink water when you exercise. Do not wait until you are thirsty. Drink every 10-15 minutes.  Stop exercising right away if you have pain, nausea, shortness of breath, or dizziness.  If you are exercising in a wheelchair, make sure to lock the wheels.  Ask your health care provider whether you can do tai chi or yoga. Many positions in these mind-body exercises can be modified to do while seated. Warm-up Before starting other exercises: 1. Sit up as straight as you can. Have your knees bent at 90 degrees, which is the shape of the capital letter "L." Keep your feet flat on the floor. 2. Sit at the front edge of your chair, if you can. 3. Pull in (tighten) the muscles in your abdomen and stretch your spine and neck as straight as you can. Hold this position for a few minutes. 4. Breathe in and out evenly. Try to concentrate on your breathing, and relax your mind. Stretching Exercise A: Arm stretch 1. Hold your arms out straight in front of your body. 2. Bend your hands at the wrist with your fingers pointing up, as if signaling someone to stop. Notice the slight tension in your forearms as you hold the position. 3. Keeping your arms out and your hands bent, rotate your hands outward as far as you can and hold this stretch. Aim to have your thumbs pointing up and your pinkie fingers pointing  down. Slowly repeat arm stretches for one minute as tolerated. Exercise B: Leg stretch 1. If you can move your legs, try to "draw" letters on the floor with the toes of your foot. Write your name with one foot. 2. Write your name with the toes of your other foot. Slowly repeat the movements for one minute as tolerated. Exercise C: Reach for the sky 1. Reach your hands as far over your head as you can to stretch your  spine. 2. Move your hands and arms as if you are climbing a rope. Slowly repeat the movements for one minute as tolerated. Range of motion exercises Exercise A: Shoulder roll 1. Let your arms hang loosely at your sides. 2. Lift just your shoulders up toward your ears, then let them relax back down. 3. When your shoulders feel loose, rotate your shoulders in backward and forward circles. Do shoulder rolls slowly for one minute as tolerated. Exercise B: March in place 1. As if you are marching, pump your arms and lift your legs up and down. Lift your knees as high as you can. ? If you are unable to lift your knees, just pump your arms and move your ankles and feet up and down. March in place for one minute as tolerated. Exercise C: Seated jumping jacks 1. Let your arms hang down straight. 2. Keeping your arms straight, lift them up over your head. Aim to point your fingers to the ceiling. 3. While you lift your arms, straighten your legs and slide your heels along the floor to your sides, as wide as you can. 4. As you bring your arms back down to your sides, slide your legs back together. ? If you are unable to use your legs, just move your arms. Slowly repeat seated jumping jacks for one minute as tolerated. Strengthening exercises Exercise A: Shoulder squeeze 1. Hold your arms straight out from your body to your sides, with your elbows bent and your fists pointed at the ceiling. 2. Keeping your arms in the bent position, move them forward so your elbows and forearms meet in front of your face. 3. Open your arms back out as wide as you can with your elbows still bent, until you feel your shoulder blades squeezing together. Hold for 5 seconds. Slowly repeat the movements forward and backward for one minute as tolerated. Contact a health care provider if you:  Had to stop exercising due to any of the following: ? Pain. ? Nausea. ? Shortness of breath. ? Dizziness. ? Fatigue.  Have  significant pain or soreness after exercising. Get help right away if you have:  Chest pain.  Difficulty breathing. These symptoms may represent a serious problem that is an emergency. Do not wait to see if the symptoms will go away. Get medical help right away. Call your local emergency services (911 in the [place] Do not drive yourself to the hospital. This information is not intended to replace advice given to you by your health care provider. Make sure you discuss any questions you have with your health care provider. Document Revised: 11/21/2018 Document Reviewed: 06/13/2017 Elsevier Patient Education  2020 [employer].

## 2020-01-01 ENCOUNTER — Other Ambulatory Visit: Payer: Self-pay

## 2020-01-01 DIAGNOSIS — E782 Mixed hyperlipidemia: Secondary | ICD-10-CM

## 2020-01-01 DIAGNOSIS — I10 Essential (primary) hypertension: Secondary | ICD-10-CM

## 2020-01-01 NOTE — Progress Notes (Signed)
Patient had an appt with Sara Beth Brown, PharmD. 

## 2020-01-06 ENCOUNTER — Ambulatory Visit (INDEPENDENT_AMBULATORY_CARE_PROVIDER_SITE_OTHER): Payer: Medicare Other | Admitting: Family Medicine

## 2020-01-06 ENCOUNTER — Encounter: Payer: Self-pay | Admitting: Family Medicine

## 2020-01-06 ENCOUNTER — Other Ambulatory Visit: Payer: Self-pay

## 2020-01-06 VITALS — BP 122/62 | HR 65 | Temp 97.1°F | Ht 62.0 in | Wt 114.0 lb

## 2020-01-06 DIAGNOSIS — R0600 Dyspnea, unspecified: Secondary | ICD-10-CM | POA: Diagnosis not present

## 2020-01-06 DIAGNOSIS — R0789 Other chest pain: Secondary | ICD-10-CM | POA: Diagnosis not present

## 2020-01-06 DIAGNOSIS — R0902 Hypoxemia: Secondary | ICD-10-CM | POA: Diagnosis not present

## 2020-01-06 DIAGNOSIS — R0609 Other forms of dyspnea: Secondary | ICD-10-CM | POA: Insufficient documentation

## 2020-01-06 NOTE — Progress Notes (Signed)
Acute Office Visit  Subjective:    Patient ID: Bridget [name], female    DOB: 1940/08/19, 79 y.o.   MRN: [id number]  Chief Complaint  Patient presents with  . Shortness of Breath    Sxs started Saturday night.    HPI Patient is in today complaining of shortness of breath.  This really began about 2 years ago but it has gradually worsened.  It is episodic.  It is accompanied by coughing, wheezing, and generally chest tightness or heaviness.  The most recent episode began over the weekend.  She has been having to use her albuterol every 4 hours which helps but then she awakens in the middle the night feeling very short of breath.  She also feels like she has some change in her hearing. Patient has no history of CAD. Has a pacemaker. Covid vaccines Proofreader) in January.  It also feels like she  gives out easily.  Not only with her breathing but also with her legs just feeling weak. Patient has a history of dvt after pacemaker placement.  Past Medical History:  Diagnosis Date  . Atrioventricular block   . Diabetes mellitus without complication (HCC)   . Fibromyalgia   . Heart disease   . Hypertension   . Insomnia   . Kidney disease   . Kidney stones 01/12/1983  . Major depression   . Major depressive disorder, recurrent, mild (HCC)   . Mixed hyperlipidemia   . Other amnesia   . 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 Thyroid disease     Past Surgical History:  Procedure Laterality Date  . ABDOMINAL HYSTERECTOMY  03/05/1998   pt does not think this was a total hysterectomy  . APPENDECTOMY  12/31/1988  . BREAST LUMPECTOMY Left 05/22/1996  . pacemaker  09/27/2016    Family History  Problem Relation Age of Onset  . High blood pressure Mother   . Diabetes Mother        no medication needed  . Kidney disease Mother   . Pneumonia Mother   . High blood pressure Father   . Cancer Maternal Grandmother        Leukemia  . Cancer Other        Ovarian     Social History   Socioeconomic History  . Marital status: Married    Spouse name: Not on file  . Number of children: 2  . Years of education: Not on file  . Highest education level: 9th grade  Occupational History  . Not on file  Tobacco Use  . Smoking status: Never Smoker  . Smokeless tobacco: Never Used  Substance and Sexual Activity  . Alcohol use: Never  . Drug use: Never  . Sexual activity: Not on file  Other Topics Concern  . Not on file  Social History Narrative   Lives at home with her husband   Right handed   Caffeine: mostly tea, 0-2 cups a day   Social Determinants of Health   Financial Resource Strain:   . Difficulty of Paying Living Expenses:   Food Insecurity:   . Worried About Programme researcher, broadcasting/film/video in the Last Year:   . Barista in the Last Year:   Transportation Needs:   . Freight forwarder (Medical):   Marland Kitchen Lack of Transportation (Non-Medical):   Physical Activity:   . Days of Exercise per Week:   . Minutes of Exercise per Session:   Stress:   .  Feeling of Stress :   Social Connections:   . Frequency of Communication with Friends and Family:   . Frequency of Social Gatherings with Friends and Family:   . Attends Religious Services:   . Active Member of Clubs or Organizations:   . Attends Archivist Meetings:   Marland Kitchen Marital Status:   Intimate Partner Violence:   . Fear of Current or Ex-Partner:   . Emotionally Abused:   Marland Kitchen Physically Abused:   . Sexually Abused:     Outpatient Medications Prior to Visit  Medication Sig Dispense Refill  . albuterol (VENTOLIN HFA) 108 (90 Base) MCG/ACT inhaler Inhale into the lungs every 6 (six) hours as needed for wheezing or shortness of breath.    Marland Kitchen amLODipine (NORVASC) 5 MG tablet Take 1 tablet by mouth once daily    . aspirin 81 MG chewable tablet Chew 81 mg by mouth in the morning.     . carvedilol (COREG) 25 MG tablet Take 25 mg by mouth 2 (two) times daily with a meal.    .  Cholecalciferol (VITAMIN D-3) 1000 UNITS CAPS Take 1,000 Units by mouth daily.    . fenofibrate 160 MG tablet Take 1 tablet by mouth once daily 90 tablet 0  . furosemide (LASIX) 20 MG tablet Take 20 mg by mouth daily as needed.     Marland Kitchen glucose blood (ONETOUCH ULTRA) test strip Use onetouch ultra test strips as instructed to check blood sugar once daily. DX:E11.9 67 each 3  . Lancets (ONETOUCH DELICA PLUS [id number]) MISC 1 each by Other route 2 (two) times daily. DX:E11.9; WILL PROVIDE 30 DAY SUPPLY. MUST CALL TO SCHEDULE AN APPT WITH LABS PRIOR TO APPT 100 each 3  . Magnesium 250 MG TABS Take 250 mg by mouth daily.    . metFORMIN (GLUCOPHAGE) 500 MG tablet TAKE 1 TABLET BY MOUTH TWICE DAILY WITH MORNING AND EVENING MEALS 180 tablet 1  . montelukast (SINGULAIR) 10 MG tablet Take 1 tablet (10 mg total) by mouth at bedtime. (Patient not taking: Reported on 01/06/2020) 30 tablet 11  . Multiple Vitamin (MULTI VITAMIN DAILY PO) Take by mouth daily.    . nitroGLYCERIN (NITROSTAT) 0.4 MG SL tablet Place 0.4 mg under the tongue every 5 (five) minutes as needed for chest pain.    . polyethylene glycol (MIRALAX / GLYCOLAX) packet Take 17 g by mouth daily as needed.    . Rivaroxaban (XARELTO) 15 MG TABS tablet Take 15 mg by mouth daily.    Marland Kitchen venlafaxine XR (EFFEXOR-XR) 75 MG 24 hr capsule Take 1 capsule by mouth once daily 90 capsule 0  . venlafaxine (EFFEXOR) 75 MG tablet Take 75 mg by mouth daily.     No facility-administered medications prior to visit.    Allergies  Allergen Reactions  . Atorvastatin Other (See Comments)    "muscle Cramps"  . Budesonide Other (See Comments)    "fatigue"  . Gabapentin Other (See Comments)    "fatigue"  . Metoclopramide Other (See Comments)    "mayalgia"  . Rosuvastatin Other (See Comments)    "muscle cramps"  . Simvastatin Other (See Comments)    "muscle Cramps"  . Sulfamethoxazole-Trimethoprim Rash    Review of Systems  Constitutional: Positive for fatigue.  Negative for chills, fever and unexpected weight change.  HENT: Positive for rhinorrhea. Negative for congestion and sore throat.        Ear congestion. Feels full.   Respiratory: Positive for cough, choking (Gets choked alot while  eating), shortness of breath and wheezing. Negative for chest tightness.   Cardiovascular: Positive for leg swelling (Rt lower leg. Elevating helps). Negative for chest pain.       Chest has felt heavy at times  Gastrointestinal: Positive for abdominal distention (feels like her stomach is bloated). Negative for nausea.  Endocrine: Positive for polyuria (nocturia).  Neurological: Positive for headaches.       Patient states at times she "sees things/spots just floating around".       Objective:    Physical Exam  BP 122/62   Pulse 65   Temp (!) 97.1 F (36.2 C)   Ht 5\' 2"  (1.575 m)   Wt 114 lb (51.7 kg)   SpO2 95%   BMI 20.85 kg/m  Wt Readings from Last 3 Encounters:  01/06/20 114 lb (51.7 kg)  11/20/19 117 lb (53.1 kg)  09/24/19 116 lb 9.6 oz (52.9 kg)    Health Maintenance Due  Topic Date Due  . COVID-19 Vaccine (1) Never done  . OPHTHALMOLOGY EXAM  07/09/2018  . FOOT EXAM  01/02/2020  . URINE MICROALBUMIN  01/02/2020    There are no preventive care reminders to display for this patient.   Lab Results  Component Value Date   TSH 1.40 01/02/2019   Lab Results  Component Value Date   WBC 6.9 11/20/2019   HGB 11.8 11/20/2019   HCT 36.1 11/20/2019   MCV 89 11/20/2019   PLT 211 11/20/2019   Lab Results  Component Value Date   NA 141 11/20/2019   K 4.1 11/20/2019   CO2 22 11/20/2019   GLUCOSE 117 (H) 11/20/2019   BUN 21 11/20/2019   CREATININE 0.97 11/20/2019   BILITOT 0.4 11/20/2019   ALKPHOS 56 11/20/2019   AST 33 11/20/2019   ALT 19 11/20/2019   PROT 5.9 (L) 11/20/2019   ALBUMIN 3.8 11/20/2019   CALCIUM 9.3 11/20/2019   GFR 54.56 (L) 01/02/2019   Lab Results  Component Value Date   CHOL 142 11/20/2019   Lab Results   Component Value Date   HDL 56 11/20/2019   Lab Results  Component Value Date   LDLCALC 66 11/20/2019   Lab Results  Component Value Date   TRIG 113 11/20/2019   Lab Results  Component Value Date   CHOLHDL 2.5 11/20/2019   Lab Results  Component Value Date   HGBA1C 6.3 (H) 11/20/2019       Assessment & Plan:  1. Dyspnea on exertion 2. Hypoxia 3. Hx DVT (postpacemaker) 4. Asthma  Plan: Check stat labs: cbc, cmp, ddimer, troponin Increase xarelto to 15 mg one twice a day. Start breztri inhaler 2 puffs twice daily. Continue ventolin hfa 2 puffs four times a day as needed for shortness of breath.  Order cxr. If DDimer is positive, I will order cta of chest in am.    Follow-up: No follow-ups on file.  An After Visit Summary was printed and given to the patient.  01/20/2020 [facility] [phone]

## 2020-01-06 NOTE — Patient Instructions (Signed)
1. Dyspnea on exertion 2. Hypoxia 3. Hx DVT (postpacemaker) 4. Asthma  Plan: Check stat labs: cbc, cmp, ddimer. Increase xarelto to 15 mg one twice a day. Start breztri inhaler 2 puffs twice daily.  Order cxr. If DDimer is positive, I will order cta of chest in am.

## 2020-01-07 ENCOUNTER — Other Ambulatory Visit: Payer: Self-pay | Admitting: Family Medicine

## 2020-01-07 DIAGNOSIS — R0602 Shortness of breath: Secondary | ICD-10-CM | POA: Diagnosis not present

## 2020-01-07 DIAGNOSIS — R0609 Other forms of dyspnea: Secondary | ICD-10-CM

## 2020-01-07 DIAGNOSIS — E041 Nontoxic single thyroid nodule: Secondary | ICD-10-CM

## 2020-01-07 MED ORDER — FUROSEMIDE 20 MG PO TABS: 20.0000 mg | ORAL_TABLET | Freq: Every day | ORAL | 0 refills | Status: DC

## 2020-01-08 ENCOUNTER — Encounter: Payer: Self-pay | Admitting: Family Medicine

## 2020-01-09 DIAGNOSIS — R0600 Dyspnea, unspecified: Secondary | ICD-10-CM | POA: Diagnosis not present

## 2020-01-09 DIAGNOSIS — I442 Atrioventricular block, complete: Secondary | ICD-10-CM | POA: Diagnosis not present

## 2020-01-09 DIAGNOSIS — Z45018 Encounter for adjustment and management of other part of cardiac pacemaker: Secondary | ICD-10-CM | POA: Diagnosis not present

## 2020-01-09 DIAGNOSIS — I5032 Chronic diastolic (congestive) heart failure: Secondary | ICD-10-CM | POA: Diagnosis not present

## 2020-01-09 DIAGNOSIS — Z7901 Long term (current) use of anticoagulants: Secondary | ICD-10-CM | POA: Diagnosis not present

## 2020-01-09 DIAGNOSIS — Z86718 Personal history of other venous thrombosis and embolism: Secondary | ICD-10-CM | POA: Diagnosis not present

## 2020-01-13 ENCOUNTER — Other Ambulatory Visit: Payer: Self-pay

## 2020-01-13 ENCOUNTER — Ambulatory Visit (INDEPENDENT_AMBULATORY_CARE_PROVIDER_SITE_OTHER): Payer: Medicare Other

## 2020-01-13 VITALS — BP 102/58 | HR 62 | Temp 97.5°F | Resp 16 | Ht 60.0 in | Wt 112.6 lb

## 2020-01-13 DIAGNOSIS — Z0000 Encounter for general adult medical examination without abnormal findings: Secondary | ICD-10-CM | POA: Diagnosis not present

## 2020-01-13 NOTE — Patient Instructions (Addendum)
Fall Prevention in the Home, Adult Falls can cause injuries. They can happen to people of all ages. There are many things you can do to make your home safe and to help prevent falls. Ask for help when making these changes, if needed. What actions can I take to prevent falls? General Instructions  Use good lighting in all rooms. Replace any light bulbs that burn out.  Turn on the lights when you go into a dark area. Use night-lights.  Keep items that you use often in easy-to-reach places. Lower the shelves around your home if necessary.  Set up your furniture so you have a clear path. Avoid moving your furniture around.  Do not have throw rugs and other things on the floor that can make you trip.  Avoid walking on wet floors.  If any of your floors are uneven, fix them.  Add color or contrast paint or tape to clearly mark and help you see: ? Any grab bars or handrails. ? First and last steps of stairways. ? Where the edge of each step is.  If you use a stepladder: ? Make sure that it is fully opened. Do not climb a closed stepladder. ? Make sure that both sides of the stepladder are locked into place. ? Ask someone to hold the stepladder for you while you use it.  If there are any pets around you, be aware of where they are. What can I do in the bathroom?      Keep the floor dry. Clean up any water that spills onto the floor as soon as it happens.  Remove soap buildup in the tub or shower regularly.  Use non-skid mats or decals on the floor of the tub or shower.  Attach bath mats securely with double-sided, non-slip rug tape.  If you need to sit down in the shower, use a plastic, non-slip stool.  Install grab bars by the toilet and in the tub and shower. Do not use towel bars as grab bars. What can I do in the bedroom?  Make sure that you have a light by your bed that is easy to reach.  Do not use any sheets or blankets that are too big for your bed. They should  not hang down onto the floor.  Have a firm chair that has side arms. You can use this for support while you get dressed. What can I do in the kitchen?  Clean up any spills right away.  If you need to reach something above you, use a strong step stool that has a grab bar.  Keep electrical cords out of the way.  Do not use floor polish or wax that makes floors slippery. If you must use wax, use non-skid floor wax. What can I do with my stairs?  Do not leave any items on the stairs.  Make sure that you have a light switch at the top of the stairs and the bottom of the stairs. If you do not have them, ask someone to add them for you.  Make sure that there are handrails on both sides of the stairs, and use them. Fix handrails that are broken or loose. Make sure that handrails are as long as the stairways.  Install non-slip stair treads on all stairs in your home.  Avoid having throw rugs at the top or bottom of the stairs. If you do have throw rugs, attach them to the floor with carpet tape.  Choose a carpet that  does not hide the edge of the steps on the stairway.  Check any carpeting to make sure that it is firmly attached to the stairs. Fix any carpet that is loose or worn. What can I do on the outside of my home?  Use bright outdoor lighting.  Regularly fix the edges of walkways and driveways and fix any cracks.  Remove anything that might make you trip as you walk through a door, such as a raised step or threshold.  Trim any bushes or trees on the path to your home.  Regularly check to see if handrails are loose or broken. Make sure that both sides of any steps have handrails.  Install guardrails along the edges of any raised decks and porches.  Clear walking paths of anything that might make someone trip, such as tools or rocks.  Have any leaves, snow, or ice cleared regularly.  Use sand or salt on walking paths during winter.  Clean up any spills in your garage right  away. This includes grease or oil spills. What other actions can I take?  Wear shoes that: ? Have a low heel. Do not wear high heels. ? Have rubber bottoms. ? Are comfortable and fit you well. ? Are closed at the toe. Do not wear open-toe sandals.  Use tools that help you move around (mobility aids) if they are needed. These include: ? Canes. ? Walkers. ? Scooters. ? Crutches.  Review your medicines with your doctor. Some medicines can make you feel dizzy. This can increase your chance of falling. Ask your doctor what other things you can do to help prevent falls. Where to find more information  Centers for Disease Control and Prevention, [facility]: https://garcia.biz/  [employer] on Aging: BrainJudge.co.uk Contact a doctor if:  You are afraid of falling at home.  You feel weak, drowsy, or dizzy at home.  You fall at home. Summary  There are many simple things that you can do to make your home safe and to help prevent falls.  Ways to make your home safe include removing tripping hazards and installing grab bars in the bathroom.  Ask for help when making these changes in your home. This information is not intended to replace advice given to you by your health care provider. Make sure you discuss any questions you have with your health care provider. Document Revised: 11/21/2018 Document Reviewed: 03/15/2017 Elsevier Patient Education  2020 [place] Maintenance, Female Adopting a healthy lifestyle and getting preventive care are important in promoting health and wellness. Ask your health care provider about:  The right schedule for you to have regular tests and exams.  Things you can do on your own to prevent diseases and keep yourself healthy. What should I know about diet, weight, and exercise? Eat a healthy diet   Eat a diet that includes plenty of vegetables, fruits, low-fat dairy products, and lean protein.  Do not eat a lot of foods  that are high in solid fats, added sugars, or sodium. Maintain a healthy weight Body mass index (BMI) is used to identify weight problems. It estimates body fat based on height and weight. Your health care provider can help determine your BMI and help you achieve or maintain a healthy weight. Get regular exercise Get regular exercise. This is one of the most important things you can do for your health. Most adults should:  Exercise for at least 150 minutes each week. The exercise should increase your heart rate  and make you sweat (moderate-intensity exercise).  Do strengthening exercises at least twice a week. This is in addition to the moderate-intensity exercise.  Spend less time sitting. Even light physical activity can be beneficial. Watch cholesterol and blood lipids Have your blood tested for lipids and cholesterol at 77 years of age, then have this test every 5 years. Have your cholesterol levels checked more often if:  Your lipid or cholesterol levels are high.  You are older than 77 years of age.  You are at high risk for heart disease. What should I know about cancer screening? Depending on your health history and family history, you may need to have cancer screening at various ages. This may include screening for:  Breast cancer.  Cervical cancer.  Colorectal cancer.  Skin cancer.  Lung cancer. What should I know about heart disease, diabetes, and high blood pressure? Blood pressure and heart disease  High blood pressure causes heart disease and increases the risk of stroke. This is more likely to develop in people who have high blood pressure readings, are of African descent, or are overweight.  Have your blood pressure checked: ? Every 3-5 years if you are 85-56 years of age. ? Every year if you are 24 years old or older. Diabetes Have regular diabetes screenings. This checks your fasting blood sugar level. Have the screening done:  Once every three years after  age 44 if you are at a normal weight and have a low risk for diabetes.  More often and at a younger age if you are overweight or have a high risk for diabetes. What should I know about preventing infection? Hepatitis B If you have a higher risk for hepatitis B, you should be screened for this virus. Talk with your health care provider to find out if you are at risk for hepatitis B infection. Hepatitis C Testing is recommended for:  Everyone born from [birth year] through 1963.  Anyone with known risk factors for hepatitis C. Sexually transmitted infections (STIs)  Get screened for STIs, including gonorrhea and chlamydia, if: ? You are sexually active and are younger than 77 years of age. ? You are older than 77 years of age and your health care provider tells you that you are at risk for this type of infection. ? Your sexual activity has changed since you were last screened, and you are at increased risk for chlamydia or gonorrhea. Ask your health care provider if you are at risk.  Ask your health care provider about whether you are at high risk for HIV. Your health care provider may recommend a prescription medicine to help prevent HIV infection. If you choose to take medicine to prevent HIV, you should first get tested for HIV. You should then be tested every 3 months for as long as you are taking the medicine. Pregnancy  If you are about to stop having your period (premenopausal) and you may become pregnant, seek counseling before you get pregnant.  Take 400 to 800 micrograms (mcg) of folic acid every day if you become pregnant.  Ask for birth control (contraception) if you want to prevent pregnancy. Osteoporosis and menopause Osteoporosis is a disease in which the bones lose minerals and strength with aging. This can result in bone fractures. If you are 63 years old or older, or if you are at risk for osteoporosis and fractures, ask your health care provider if you should:  Be screened for  bone loss.  Take a calcium or vitamin  D supplement to lower your risk of fractures.  Be given hormone replacement therapy (HRT) to treat symptoms of menopause. Follow these instructions at home: Lifestyle  Do not use any products that contain nicotine or tobacco, such as cigarettes, e-cigarettes, and chewing tobacco. If you need help quitting, ask your health care provider.  Do not use street drugs.  Do not share needles.  Ask your health care provider for help if you need support or information about quitting drugs. Alcohol use  Do not drink alcohol if: ? Your health care provider tells you not to drink. ? You are pregnant, may be pregnant, or are planning to become pregnant.  If you drink alcohol: ? Limit how much you use to 0-1 drink a day. ? Limit intake if you are breastfeeding.  Be aware of how much alcohol is in your drink. In the [place] one drink equals one 12 oz bottle of beer (355 mL), one 5 oz glass of wine (148 mL), or one 1 oz glass of hard liquor (44 mL). General instructions  Schedule regular health, dental, and eye exams.  Stay current with your vaccines.  Tell your health care provider if: ? You often feel depressed. ? You have ever been abused or do not feel safe at home. Summary  Adopting a healthy lifestyle and getting preventive care are important in promoting health and wellness.  Follow your health care provider's instructions about healthy diet, exercising, and getting tested or screened for diseases.  Follow your health care provider's instructions on monitoring your cholesterol and blood pressure. This information is not intended to replace advice given to you by your health care provider. Make sure you discuss any questions you have with your health care provider. Document Revised: 07/24/2018 Document Reviewed: 07/24/2018 Elsevier Patient Education  2020 [employer].    Ms. Bridget [name] , Thank you for taking time to come for your Medicare  Wellness Visit. I appreciate your ongoing commitment to your health goals. Please review the following plan we discussed and let me know if I can assist you in the future.   These are the goals we discussed: Goals    . Advanced Care Planning complete by next visit     Patient will bring a copy of her Advance Directive for our files    . Pharmacy Care Plan - DM     CARE PLAN ENTRY (see longitudinal plan of care for additional care plan information)  Current Barriers:  . Diabetes: non-insulin dependent, type 2; complicated by chronic medical conditions including Hypertension and hyperlipidemia, Lab Results  Component Value Date   HGBA1C 6.3 (H) 11/20/2019 .   Lab Results  Component Value Date   CREATININE 0.97 11/20/2019   CREATININE 0.99 01/02/2019   CREATININE 1.13 01/01/2018 .   Marland Kitchen No results found for: EGFR . Current antihyperglycemic regimen: fenofibrate . Denies hypoglycemic symptoms, including dizziness, lightheadedness, shaking, sweating . Denies hyperglycemic symptoms, including polyuria, polydipsia, polyphagia, nocturia, blurred vision, neuropathy . Current exercise: stays active doing housework and yardwork. Walks some with husband in neighborhood . Current blood glucose readings: 90, 131, 174 . Cardiovascular risk reduction: o Current hypertensive regimen:carvedilol o Current hyperlipidemia regimen: fenofibrate o Current antiplatelet regimen:xarelto and aspirin 81 mg   Pharmacist Clinical Goal(s):  Marland Kitchen Over the next 90 days, patient will work with PharmD and primary care provider to maintain a1c of <7%.   Interventions: . Comprehensive medication review performed, medication list updated in electronic medical record . Inter-disciplinary care team collaboration (  see longitudinal plan of care) . Discussed importance of healthy diet and exercise to maintain goal a1c.   Patient Self Care Activities:  . Patient will check blood glucose daily , document, and provide at  future appointments . Patient will focus on medication adherence by continue to use pill box. . Patient will take medications as prescribed . Patient will contact provider with any episodes of hypoglycemia . Patient will report any questions or concerns to provider   Initial goal documentation     . Pharmacy Care Plan - HTN     CARE PLAN ENTRY (see longitudinal plan of care for additional care plan information)  Current Barriers:  . Chronic disease management support, education, and care coordination needs related to Diabetes, hypertension, hyperlipidemia.  . Current antihypertensive regimen: carvedilol 25 mg bid . Previous antihypertensives tried: lisinopril, amlodipine, metoprolol, lisinopril-hctz . Last practice recorded BP readings:  BP Readings from Last 3 Encounters:  11/20/19 122/66  09/24/19 130/64  01/02/19 130/64 .   Marland Kitchen Current home BP readings: 104/64, 137/73, 127/69 . Most recent eGFR/CrCl: No results found for: EGFR  No components found for: CRCL  Pharmacist Clinical Goal(s):  Marland Kitchen Over the next 90 days, patient will work with PharmD to maintain blood pressure of <130/80 mmHg  Interventions: . Inter-disciplinary care team collaboration (see longitudinal plan of care) . Comprehensive medication review performed; medication list updated in the electronic medical record.  . Discussed importance of diet and exercise on blood pressure.   Patient Self Care Activities:  . Patient will continue to check BP daily, document, and provide at future appointments . Patient will focus on medication adherence by continuing to use weekly pill box. . Patient verbalizes understanding of plan to follow as described above, self administer medication as prescribed, calls pharmacy for refills and calls providers office for new concerns or questions.  Initial goal documentation        This is a list of the screening recommended for you and due dates:  Health Maintenance  Topic Date Due   . Eye exam for diabetics  07/09/2018  . Complete foot exam   01/02/2020  . Urine Protein Check  01/02/2020  . Flu Shot  03/14/2020  . Hemoglobin A1C  05/21/2020  . DEXA scan (bone density measurement)  01/26/2021  . Tetanus Vaccine  11/02/2027  . COVID-19 Vaccine  Completed  . Pneumonia vaccines  Completed

## 2020-01-13 NOTE — Progress Notes (Signed)
Subjective:   Bridget [name] is a 79 y.o. female who presents for Medicare Annual (Subsequent) preventive examination.  This wellness visit is conducted by a nurse.  The patient's medications were reviewed and reconciled since the patient's last visit.  History details were provided by the patient.  The history appears to be reliable.    Patient's last AWV was one year ago.   Medical History: Patient history and Family history was reviewed  Medications, Allergies, and preventative health maintenance was reviewed and updated.  Review of Systems:  Review of Systems  Constitutional: Negative.   HENT: Negative.   Eyes: Negative.        Diabetic eye exam scheduled for tomorrow  Respiratory: Negative.  Negative for cough, chest tightness and shortness of breath.   Cardiovascular: Negative.  Negative for chest pain and palpitations.  Gastrointestinal: Negative.   Genitourinary: Positive for urgency.  Musculoskeletal: Positive for arthralgias.       Left shoulder  Neurological: Positive for numbness. Negative for dizziness and headaches.       Tingling in feet at times  Psychiatric/Behavioral: Negative.  Negative for agitation, confusion, sleep disturbance and suicidal ideas. The patient is not nervous/anxious.    Cardiac Risk Factors include: diabetes mellitus;advanced age (>65mn, >>14women)     Objective:    Vitals: BP (!) 102/58 (BP Location: Right Arm, Patient Position: Sitting, Cuff Size: Large)    Pulse 62    Temp (!) 97.5 F (36.4 C) (Temporal)    Resp 16    Ht 5' (1.524 m)    Wt 112 lb 9.6 oz (51.1 kg)    SpO2 93%    BMI 21.99 kg/m   Body mass index is 21.99 kg/m.  Advanced Directives 01/13/2020  Does Patient Have a Medical Advance Directive? Yes  Type of AParamedicof ANarcissaLiving will  Does patient want to make changes to medical advance directive? No - Patient declined  Copy of HVentressin Chart? No - copy requested     Tobacco Social History   Tobacco Use  Smoking Status Never Smoker  Smokeless Tobacco Never Used      Clinical Intake:  Pre-visit preparation completed: Yes  Pain : No/denies pain Pain Score: 0-No pain     BMI - recorded: 21.99 Nutritional Status: BMI of 19-24  Normal Nutritional Risks: None Diabetes: Yes CBG done?: No Did pt. bring in CBG monitor from home?: No  How often do you need to have someone help you when you read instructions, pamphlets, or other written materials from your doctor or pharmacy?: 2 - Rarely  Interpreter Needed?: No     Past Medical History:  Diagnosis Date   Asthma    Atrioventricular block    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[facility]    Thyroid disease    Past Surgical History:  Procedure Laterality Date   ABDOMINAL HYSTERECTOMY  03/05/1998   pt does not think this was a total hysterectomy   APPENDECTOMY  12/31/1988   BREAST LUMPECTOMY Left 05/22/1996   pacemaker  09/27/2016   Family History  Problem  Relation Age of Onset   High blood pressure Mother    Diabetes Mother        no medication needed   Kidney disease Mother    Pneumonia Mother    High blood pressure Father    Cancer Maternal Grandmother        Leukemia   Cancer Other        Ovarian    Outpatient Encounter Medications as of 01/13/2020  Medication Sig   albuterol (VENTOLIN HFA) 108 (90 Base) MCG/ACT inhaler Inhale into the lungs every 6 (six) hours as needed for wheezing or shortness of breath.   aspirin 81 MG chewable tablet Chew 81 mg by mouth in the morning.    carvedilol (COREG) 25 MG tablet Take 25 mg by mouth 2 (two)  times daily with a meal.   Cholecalciferol (VITAMIN D-3) 1000 UNITS CAPS Take 1,000 Units by mouth daily.   fenofibrate 160 MG tablet Take 1 tablet by mouth once daily   furosemide (LASIX) 20 MG tablet Take 1 tablet (20 mg total) by mouth daily.   glucose blood (ONETOUCH ULTRA) test strip Use onetouch ultra test strips as instructed to check blood sugar once daily. DX:E11.9   Lancets (ONETOUCH DELICA PLUS [id number]) MISC 1 each by Other route 2 (two) times daily. DX:E11.9; WILL PROVIDE 30 DAY SUPPLY. MUST CALL TO SCHEDULE AN APPT WITH LABS PRIOR TO APPT   Magnesium 250 MG TABS Take 250 mg by mouth daily.   metFORMIN (GLUCOPHAGE) 500 MG tablet TAKE 1 TABLET BY MOUTH TWICE DAILY WITH MORNING AND EVENING MEALS   Multiple Vitamin (MULTI VITAMIN DAILY PO) Take by mouth daily.   nitroGLYCERIN (NITROSTAT) 0.4 MG SL tablet Place 0.4 mg under the tongue every 5 (five) minutes as needed for chest pain.   polyethylene glycol (MIRALAX / GLYCOLAX) packet Take 17 g by mouth daily as needed.   Rivaroxaban (XARELTO) 15 MG TABS tablet Take 15 mg by mouth daily.   venlafaxine XR (EFFEXOR-XR) 75 MG 24 hr capsule Take 1 capsule by mouth once daily   amLODipine (NORVASC) 5 MG tablet Take 1 tablet by mouth once daily   montelukast (SINGULAIR) 10 MG tablet Take 1 tablet (10 mg total) by mouth at bedtime. (Patient not taking: Reported on 01/06/2020)   No facility-administered encounter medications on file as of 01/13/2020.    Activities of Daily Living In your present state of health, do you have any difficulty performing the following activities: 01/13/2020 01/06/2020  Hearing? N N  Vision? N N  Difficulty concentrating or making decisions? N N  Walking or climbing stairs? N N  Dressing or bathing? N N  Doing errands, shopping? N N  Preparing Food and eating ? N -  Using the Toilet? N -  In the past six months, have you accidently leaked urine? Y -  Do you have problems with loss of bowel control? N -   Managing your Medications? N -  Managing your Finances? N -  Housekeeping or managing your Housekeeping? N -  Some recent data might be hidden    Patient Care Team: [name], MD as PCP - General (Family Medicine) Richardo Priest, MD as Consulting Physician (Cardiology) Richmond Campbell, MD as Consulting Physician (Gastroenterology) Elayne Snare, MD as Consulting Physician (Endocrinology) Burnice Logan, [facility] as Pharmacist (Pharmacist)    Assessment:   This is a routine wellness examination for Sheyla.  Exercise Activities and Dietary recommendations Current Exercise Habits: Home exercise routine, Type of  exercise: walking, Intensity: Mild  Goals     Pharmacy Care Plan - DM     CARE PLAN ENTRY (see longitudinal plan of care for additional care plan information)  Current Barriers:   Diabetes: non-insulin dependent, type 2; complicated by chronic medical conditions including Hypertension and hyperlipidemia, Lab Results  Component Value Date   HGBA1C 6.3 (H) 11/20/2019    Lab Results  Component Value Date   CREATININE 0.97 11/20/2019   CREATININE 0.99 01/02/2019   CREATININE 1.13 01/01/2018     No results found for: EGFR  Current antihyperglycemic regimen: fenofibrate  Denies hypoglycemic symptoms, including dizziness, lightheadedness, shaking, sweating  Denies hyperglycemic symptoms, including polyuria, polydipsia, polyphagia, nocturia, blurred vision, neuropathy  Current exercise: stays active doing housework and yardwork. Walks some with husband in neighborhood  Current blood glucose readings: 90, 131, 174  Cardiovascular risk reduction: o Current hypertensive regimen:carvedilol o Current hyperlipidemia regimen: fenofibrate o Current antiplatelet regimen:xarelto and aspirin 81 mg   Pharmacist Clinical Goal(s):   Over the next 90 days, patient will work with PharmD and primary care provider to maintain a1c of <7%.   Interventions:  Comprehensive  medication review performed, medication list updated in electronic medical record  Inter-disciplinary care team collaboration (see longitudinal plan of care)  Discussed importance of healthy diet and exercise to maintain goal a1c.   Patient Self Care Activities:   Patient will check blood glucose daily , document, and provide at future appointments  Patient will focus on medication adherence by continue to use pill box.  Patient will take medications as prescribed  Patient will contact provider with any episodes of hypoglycemia  Patient will report any questions or concerns to provider   Initial goal documentation      Sweetser (see longitudinal plan of care for additional care plan information)  Current Barriers:   Chronic disease management support, education, and care coordination needs related to Diabetes, hypertension, hyperlipidemia.   Current antihypertensive regimen: carvedilol 25 mg bid  Previous antihypertensives tried: lisinopril, amlodipine, metoprolol, lisinopril-hctz  Last practice recorded BP readings:  BP Readings from Last 3 Encounters:  11/20/19 122/66  09/24/19 130/64  01/02/19 130/64     Current home BP readings: 104/64, 137/73, 127/69  Most recent eGFR/CrCl: No results found for: EGFR  No components found for: CRCL  Pharmacist Clinical Goal(s):   Over the next 90 days, patient will work with PharmD to maintain blood pressure of <130/80 mmHg  Interventions:  Inter-disciplinary care team collaboration (see longitudinal plan of care)  Comprehensive medication review performed; medication list updated in the electronic medical record.   Discussed importance of diet and exercise on blood pressure.   Patient Self Care Activities:   Patient will continue to check BP daily, document, and provide at future appointments  Patient will focus on medication adherence by continuing to use weekly pill  box.  Patient verbalizes understanding of plan to follow as described above, self administer medication as prescribed, calls pharmacy for refills and calls providers office for new concerns or questions.  Initial goal documentation        Fall Risk Fall Risk  01/13/2020 11/20/2019 05/28/2018  Falls in the past year? 1 1 Yes  Number falls in past yr: 1 0 2 or more  Comment number has decreased since getting pacemaker - -  Injury with Fall? 1 1 Yes  Comment - - "sometimes"  Risk Factor Category  - - High  Fall Risk  Risk for fall due to : History of fall(s);Impaired balance/gait - -  Follow up Falls evaluation completed;Education provided;Falls prevention discussed Falls evaluation completed;Falls prevention discussed Education provided  Comment - Previously seen by physical therapy -   Is the patient's home free of loose throw rugs in walkways, pet beds, electrical cords, etc?   no      Grab bars in the bathroom? yes      Handrails on the stairs?   yes      Adequate lighting?   yes   Depression Screen PHQ 2/9 Scores 01/13/2020 12/17/2019  PHQ - 2 Score 1 1     Cognitive Function     6CIT Screen 01/13/2020  What Year? 0 points  What month? 0 points  What time? 0 points  Count back from 20 0 points  Months in reverse 2 points  Repeat phrase 2 points  Total Score 4    Immunization History  Administered Date(s) Administered   Influenza, High Dose Seasonal PF 06/05/2017   Influenza,trivalent, recombinat, inj, PF 05/14/2012   Influenza-Unspecified 07/02/2014, 04/13/2015, 04/24/2016, 06/14/2018   PFIZER SARS-COV-2 Vaccination 08/29/2019, 09/19/2019   Pneumococcal Conjugate-13 04/27/2014   Pneumococcal Polysaccharide-23 07/14/2010, 07/14/2010, 12/02/2015   Tdap 11/01/2017     Screening Tests Health Maintenance  Topic Date Due   OPHTHALMOLOGY EXAM  07/09/2018   FOOT EXAM  01/02/2020   URINE MICROALBUMIN  01/02/2020   INFLUENZA VACCINE  03/14/2020   HEMOGLOBIN  A1C  05/21/2020   DEXA SCAN  01/26/2021   TETANUS/TDAP  11/02/2027   COVID-19 Vaccine  Completed   PNA vac Low Risk Adult  Completed    Cancer Screenings: Lung: Low Dose CT Chest recommended if Age 56-80 years, 30 pack-year currently smoking OR have quit w/in 15years. Patient does not qualify. Breast:  Up to date on Mammogram? Yes   Up to date of Bone Density/Dexa? Yes Colorectal: up to date - last colonoscopy done 03/2016      Plan:    Counseling was provided today regarding the following topics: healthy eating habits, home safety, vitamin and mineral supplementation (calcium and Vit D), regular exercise, breast self-exam, tobacco avoidance, limitation of alcohol intake, use of seat belts, firearm safety, and fall prevention.  Annual recommendations include: influenza vaccine, dental cleanings, and eye exams.  Diabetic Eye Exam scheduled for tomorrow, asked to send [name] a copy of her records  Patient will find and bring a copy of her advance directives for our records   I have personally reviewed and noted the following in the patients chart:    Medical and social history  Use of alcohol, tobacco or illicit drugs   Current medications and supplements  Functional ability and status  Nutritional status  Physical activity  Advanced directives  List of other physicians  Hospitalizations, surgeries, and ER visits in previous 12 months  Vitals  Screenings to include cognitive, depression, and falls  Referrals and appointments  In addition, I have reviewed and discussed with patient certain preventive protocols, quality metrics, and best practice recommendations. A written personalized care plan for preventive services as well as general preventive health recommendations were provided to patient.     Erie Noe, LPN  02/11/2457

## 2020-01-14 DIAGNOSIS — H25813 Combined forms of age-related cataract, bilateral: Secondary | ICD-10-CM | POA: Diagnosis not present

## 2020-01-14 DIAGNOSIS — E119 Type 2 diabetes mellitus without complications: Secondary | ICD-10-CM | POA: Diagnosis not present

## 2020-01-14 LAB — HM DIABETES EYE EXAM

## 2020-01-16 ENCOUNTER — Ambulatory Visit: Payer: Medicare Other

## 2020-01-16 ENCOUNTER — Other Ambulatory Visit: Payer: Self-pay

## 2020-01-16 ENCOUNTER — Encounter: Payer: Self-pay | Admitting: Family Medicine

## 2020-01-16 DIAGNOSIS — E041 Nontoxic single thyroid nodule: Secondary | ICD-10-CM | POA: Diagnosis not present

## 2020-01-16 DIAGNOSIS — E782 Mixed hyperlipidemia: Secondary | ICD-10-CM

## 2020-01-16 DIAGNOSIS — I10 Essential (primary) hypertension: Secondary | ICD-10-CM

## 2020-01-16 DIAGNOSIS — E042 Nontoxic multinodular goiter: Secondary | ICD-10-CM | POA: Diagnosis not present

## 2020-01-16 MED ORDER — BREZTRI AEROSPHERE 160-9-4.8 MCG/ACT IN AERO: 2.0000 | INHALATION_SPRAY | Freq: Two times a day (BID) | RESPIRATORY_TRACT | 5 refills | Status: DC

## 2020-01-16 NOTE — Chronic Care Management (AMB) (Signed)
Chronic Care Management Pharmacy  Name: Bridget [name]  MRN: [id number] DOB: [birth year]  Chief Complaint/ HPI  [name],  79 y.o. , female presents for their Initial CCM visit with the clinical pharmacist via telephone due to COVID-19 Pandemic.  PCP : [name], MD  Their chronic conditions include: HTN, TIA, DM, Thyroid, HLD, Constipation.   Office Visits: 01/07/2020 - furosemide started to see if dyspnea improved. Check stat labs: cbc, cmp, ddimer, troponin. Increase xarelto to 15 mg one twice a day. Start breztri inhaler 2 puffs twice daily. Continue ventolin hfa 2 puffs four times a day as needed for shortness of breath. Order cxr. If DDimer is positive, I will order cta of chest in am.   11/20/2019 - starting singulair 10 mg daily for mild intermittent asthma.  07/21/2019 - Albuterol for SOB. Zenpep for flatulence. CBC normal, kidney and liver function normal.   Consult Visit: 01/09/2020 - Ms. Crabbe is experiencing some increase shortness of breath and dyspnea on exertion. Her recent CT scan suggested possibly some component of some edema with effusion. She has no other evidence of retaining any fluid with no lower extremity swelling and no weight gain. I agree with Dr. Tobie Poet that an initial trial of diuretics is warranted to see if this has any impact on her dyspnea. I would like to have an echocardiogram to reassess her left ventricular systolic function which has always been well-preserved. CT negative for PE.  09/24/2019 - Dr. Dwyane Dee - stop Jardiance. 09/19/2019 - Cardiology - recommended d/c of xarelto but neurologist said no due to "mini strokes".  08/28/2019 - Pacemaker check.   Medications: Outpatient Encounter Medications as of 01/16/2020  Medication Sig  . albuterol (VENTOLIN HFA) 108 (90 Base) MCG/ACT inhaler Inhale into the lungs every 6 (six) hours as needed for wheezing or shortness of breath.  Marland Kitchen amLODipine (NORVASC) 5 MG tablet Take 1 tablet by  mouth once daily  . aspirin 81 MG chewable tablet Chew 81 mg by mouth in the morning.   . carvedilol (COREG) 25 MG tablet Take 25 mg by mouth 2 (two) times daily with a meal.  . Cholecalciferol (VITAMIN D-3) 1000 UNITS CAPS Take 1,000 Units by mouth daily.  . fenofibrate 160 MG tablet Take 1 tablet by mouth once daily  . furosemide (LASIX) 20 MG tablet Take 1 tablet (20 mg total) by mouth daily.  Marland Kitchen glucose blood (ONETOUCH ULTRA) test strip Use onetouch ultra test strips as instructed to check blood sugar once daily. DX:E11.9  . Lancets (ONETOUCH DELICA PLUS OIBBCW88Q) MISC 1 each by Other route 2 (two) times daily. DX:E11.9; WILL PROVIDE 30 DAY SUPPLY. MUST CALL TO SCHEDULE AN APPT WITH LABS PRIOR TO APPT  . Magnesium 250 MG TABS Take 250 mg by mouth daily.  . metFORMIN (GLUCOPHAGE) 500 MG tablet TAKE 1 TABLET BY MOUTH TWICE DAILY WITH MORNING AND EVENING MEALS  . montelukast (SINGULAIR) 10 MG tablet Take 1 tablet (10 mg total) by mouth at bedtime. (Patient not taking: Reported on 01/06/2020)  . Multiple Vitamin (MULTI VITAMIN DAILY PO) Take by mouth daily.  . nitroGLYCERIN (NITROSTAT) 0.4 MG SL tablet Place 0.4 mg under the tongue every 5 (five) minutes as needed for chest pain.  . polyethylene glycol (MIRALAX / GLYCOLAX) packet Take 17 g by mouth daily as needed.  . Rivaroxaban (XARELTO) 15 MG TABS tablet Take 15 mg by mouth daily.  Marland Kitchen venlafaxine XR (EFFEXOR-XR) 75 MG 24 hr capsule Take 1 capsule by  mouth once daily   No facility-administered encounter medications on file as of 01/16/2020.   Allergies  Allergen Reactions  . Atorvastatin Other (See Comments)    "muscle Cramps"  . Budesonide Other (See Comments)    "fatigue"  . Gabapentin Other (See Comments)    "fatigue"  . Metoclopramide Other (See Comments)    "mayalgia"  . Rosuvastatin Other (See Comments)    "muscle cramps"  . Simvastatin Other (See Comments)    "muscle Cramps"  . Sulfamethoxazole-Trimethoprim Rash    SDOH  Screenings   Alcohol Screen:   . Last Alcohol Screening Score (AUDIT):   Depression (PHQ2-9): Low Risk   . PHQ-2 Score: 1  Financial Resource Strain:   . Difficulty of Paying Living Expenses:   Food Insecurity:   . Worried About Charity fundraiser in the Last Year:   . [employer] of Food in the Last Year:   Housing: [place]   . Last Housing Risk Score: 0  Physical Activity:   . Days of Exercise per Week:   . Minutes of Exercise per Session:   Social Connections:   . Frequency of Communication with Friends and Family:   . Frequency of Social Gatherings with Friends and Family:   . Attends Religious Services:   . Active Member of Clubs or Organizations:   . Attends Archivist Meetings:   Marland Kitchen Marital Status:   Stress:   . Feeling of Stress :   Tobacco Use: Low Risk   . Smoking Tobacco Use: Never Smoker  . Smokeless Tobacco Use: Never Used  Transportation Needs:   . Film/video editor (Medical):   Marland Kitchen Lack of Transportation (Non-Medical):      Current Diagnosis/Assessment:  Goals Addressed            This Visit's Progress   . Pharmacy Care Plan - DM       CARE PLAN ENTRY (see longitudinal plan of care for additional care plan information)  Current Barriers:  . Diabetes: non-insulin dependent, type 2; complicated by chronic medical conditions including Hypertension and hyperlipidemia, Lab Results  Component Value Date   HGBA1C 6.3 (H) 11/20/2019 .   Lab Results  Component Value Date   CREATININE 0.97 11/20/2019   CREATININE 0.99 01/02/2019   CREATININE 1.13 01/01/2018 .   Marland Kitchen No results found for: EGFR . Current antihyperglycemic regimen: fenofibrate . Denies hypoglycemic symptoms, including dizziness, lightheadedness, shaking, sweating . Denies hyperglycemic symptoms, including polyuria, polydipsia, polyphagia, nocturia, blurred vision, neuropathy . Current exercise: stays active doing housework and yardwork. Walks some with husband in  neighborhood . Current blood glucose readings: 90, 131, 174 . Cardiovascular risk reduction: o Current hypertensive regimen:carvedilol o Current hyperlipidemia regimen: fenofibrate o Current antiplatelet regimen:xarelto and aspirin 81 mg   Pharmacist Clinical Goal(s):  Marland Kitchen Over the next 90 days, patient will work with PharmD and primary care provider to maintain a1c of <7%.   Interventions: . Comprehensive medication review performed, medication list updated in electronic medical record . Inter-disciplinary care team collaboration (see longitudinal plan of care) . Discussed importance of healthy diet and exercise to maintain goal a1c.   Patient Self Care Activities:  . Patient will check blood glucose daily , document, and provide at future appointments . Patient will focus on medication adherence by continue to use pill box. . Patient will take medications as prescribed . Patient will contact provider with any episodes of hypoglycemia . Patient will report any questions or concerns to provider  Please see past updates related to this goal by clicking on the "Past Updates" button in the selected goal      . Pharmacy Care Plan - HTN       CARE PLAN ENTRY (see longitudinal plan of care for additional care plan information)  Current Barriers:  . Chronic disease management support, education, and care coordination needs related to Diabetes, hypertension, hyperlipidemia.  . Current antihypertensive regimen: carvedilol 25 mg bid, furosemide 20 mg daily . Previous antihypertensives tried: lisinopril, amlodipine, metoprolol, lisinopril-hctz . Last practice recorded BP readings:  BP Readings from Last 3 Encounters:  01/13/20 (!) 102/58  01/06/20 122/62  11/20/19 122/66 .   Marland Kitchen Current home BP readings: 104/64,  . Most recent eGFR/CrCl: No results found for: EGFR  No components found for: CRCL  Pharmacist Clinical Goal(s):  Marland Kitchen Over the next 90 days, patient will work with PharmD to  maintain blood pressure of <130/80 mmHg  Interventions: . Inter-disciplinary care team collaboration (see longitudinal plan of care) . Comprehensive medication review performed; medication list updated in the electronic medical record.  . Discussed importance of diet and exercise on blood pressure.   Patient Self Care Activities:  . Patient will continue to check BP daily, document, and provide at future appointments . Patient will focus on medication adherence by continuing to use weekly pill box. . Patient verbalizes understanding of plan to follow as described above, self administer medication as prescribed, calls pharmacy for refills and calls providers office for new concerns or questions.  Please see past updates related to this goal by clicking on the "Past Updates" button in the selected goal         Diabetes   Recent Relevant Labs: Lab Results  Component Value Date/Time   HGBA1C 6.3 (H) 11/20/2019 09:31 AM   HGBA1C 6.0 (A) 09/24/2019 01:20 PM   HGBA1C 6.1 (A) 01/02/2019 09:15 AM   HGBA1C 5.6 02/29/2016 10:11 AM   MICROALBUR 1.8 01/02/2019 09:32 AM   MICROALBUR <0.7 01/01/2018 01:48 PM     Checking BG: Daily  Recent FBG Readings: 130-132  Patient has failed these meds in past: Jardiance 10 mg, pioglitazone 30 mg Patient is currently controlled on the following medications: metformin 500 mg bid, one touch ultra test strips, lancets  Last diabetic Foot exam: 11/20/2019   Last diabetic Eye exam:  Appointment set in June 2021  We discussed: diet and exercise extensively. Patient was eating good when she cooks at home. Lots of meat and vegetables. She hasn't felt as good in the past week and hasn't cooked as much at home. Patient loves sweets.   Update 01/16/2020 - Patient indicates good blood sugar control. She had to stop her metformin for 48 hours around the time of her CT scan. Her blood sugars remained stable during that time as well.   Plan  Continue current  medications  Hypertension   BP today is:  <130/80  Office blood pressures are  BP Readings from Last 3 Encounters:  01/13/20 (!) 102/58  01/06/20 122/62  11/20/19 122/66    Patient has failed these meds in the past: amlodipine 5 mg, lisinopril 10 mg, lisinopril-hctz 20-12.5 mg, metoprolol succinate 25 mg , furosemide 20 mg daily Patient is currently controlled on the following medications: carvedilol 25 mg bid, furosemide 20 mg daily prn  Patient checks BP at home daily  Patient home BP readings are ranging: 113/64 mmHg  We discussed diet and exercise extensively. Had a pacemaker 09/27/2016. Most exercise is  in her house. Her legs get tired walking up the hill in her neighborhood. She stays active walking stairs in her home.   Update 01/16/2020 - When walking or going up stairs she is still experiencing SOB. She is having to rest after running errands like yesterday riding to [place] and going to a new grocery store. She indicates that her weight has dropped a few pounds since beginning furosemide. Her bp is stable and staying around 113/64 mmHg.   Plan  Continue current medications   Hx of DVT   Patient has failed these meds in past: n/a Patient is currently controlled on the following medications: Xarelto 15 mg daily  We discussed:  Had a blood clot when the pacemaker was put in. She was started on Xarelto at that time. She takes it even though she is outside of the treatment window. She felt more comfortable staying on Xarelto. She has lots of bruising. She does enter the doughnut hole at the end of the year but doesn't think she would qualify for patient assistance. Pharmacist will provide samples if available at the end of this year to avoid doughnut hole pricing.   Update 01/16/2020:  Patient recently completed a CT scan to rule out possible PE. She indicates that this was quite scary but the scan came back negative. She is continued on Xarelto.   Plan  Continue  current medications   COPD / Asthma / Tobacco   Eosinophil count:  No results found for: EOSPCT%                               Eos (Absolute):  Lab Results  Component Value Date/Time   EOSABS 0.4 11/20/2019 09:31 AM    Tobacco Status:  Social History   Tobacco Use  Smoking Status Never Smoker  Smokeless Tobacco Never Used    Patient has failed these meds in past: n/a Patient is currently uncontrolled on the following medications: proair Using maintenance inhaler regularly? No Frequency of rescue inhaler use:  several times per month  We discussed:  Patient talked with Dr. Tobie Poet about Singulair at last visit. She has not started anything yet and pharmacy does not have script. Patient wants to make sure that the Singulair won't do anything to her blood count/kidney issue. Dr. Tobie Poet ordered medication for patient and pharmacist let patient know that it was at the pharmacy. Patient will begin and pharmacist will call next month to monitor efficacy. Patient indicates that pollen season makes breathing condition worse.   Update 01/16/2020: Judithann Sauger is improving breathing. She still has some SOB upon exertion. She feels that her lungs are much more open. She has 1 day left of Breztri but not certain if she should continue it or not. Dr. Tobie Poet did want her to continue it and 2 additional samples were provided. Patient's husband picked up medication. A prescription to [facility] is also being sent for patient.   Plan  Continue current medications     Medication Management   Pt uses [facility] for all medications Uses pill box? Yes Pt endorses good compliance rarely misses medications.   We discussed: She gets her medicine box out each morning.   Plan  Continue current medication management strategy    Follow up: 1 month phone visit

## 2020-01-16 NOTE — Patient Instructions (Addendum)
Visit Information  Goals Addressed            This Visit's Progress   . Pharmacy Care Plan - DM       CARE PLAN ENTRY (see longitudinal plan of care for additional care plan information)  Current Barriers:  . Diabetes: non-insulin dependent, type 2; complicated by chronic medical conditions including Hypertension and hyperlipidemia, Lab Results  Component Value Date   HGBA1C 6.3 (H) 11/20/2019 .   Lab Results  Component Value Date   CREATININE 0.97 11/20/2019   CREATININE 0.99 01/02/2019   CREATININE 1.13 01/01/2018 .   Marland Kitchen No results found for: EGFR . Current antihyperglycemic regimen: fenofibrate . Denies hypoglycemic symptoms, including dizziness, lightheadedness, shaking, sweating . Denies hyperglycemic symptoms, including polyuria, polydipsia, polyphagia, nocturia, blurred vision, neuropathy . Current exercise: stays active doing housework and yardwork. Walks some with husband in neighborhood . Current blood glucose readings: 90, 131, 174 . Cardiovascular risk reduction: o Current hypertensive regimen:carvedilol o Current hyperlipidemia regimen: fenofibrate o Current antiplatelet regimen:xarelto and aspirin 81 mg   Pharmacist Clinical Goal(s):  Marland Kitchen Over the next 90 days, patient will work with PharmD and primary care provider to maintain a1c of <7%.   Interventions: . Comprehensive medication review performed, medication list updated in electronic medical record . Inter-disciplinary care team collaboration (see longitudinal plan of care) . Discussed importance of healthy diet and exercise to maintain goal a1c.   Patient Self Care Activities:  . Patient will check blood glucose daily , document, and provide at future appointments . Patient will focus on medication adherence by continue to use pill box. . Patient will take medications as prescribed . Patient will contact provider with any episodes of hypoglycemia . Patient will report any questions or concerns to  provider   Please see past updates related to this goal by clicking on the "Past Updates" button in the selected goal      . Pharmacy Care Plan - HTN       CARE PLAN ENTRY (see longitudinal plan of care for additional care plan information)  Current Barriers:  . Chronic disease management support, education, and care coordination needs related to Diabetes, hypertension, hyperlipidemia.  . Current antihypertensive regimen: carvedilol 25 mg bid, furosemide 20 mg daily . Previous antihypertensives tried: lisinopril, amlodipine, metoprolol, lisinopril-hctz . Last practice recorded BP readings:  BP Readings from Last 3 Encounters:  01/13/20 (!) 102/58  01/06/20 122/62  11/20/19 122/66 .   Marland Kitchen Current home BP readings: 104/64,  . Most recent eGFR/CrCl: No results found for: EGFR  No components found for: CRCL  Pharmacist Clinical Goal(s):  Marland Kitchen Over the next 90 days, patient will work with PharmD to maintain blood pressure of <130/80 mmHg  Interventions: . Inter-disciplinary care team collaboration (see longitudinal plan of care) . Comprehensive medication review performed; medication list updated in the electronic medical record.  . Discussed importance of diet and exercise on blood pressure.   Patient Self Care Activities:  . Patient will continue to check BP daily, document, and provide at future appointments . Patient will focus on medication adherence by continuing to use weekly pill box. . Patient verbalizes understanding of plan to follow as described above, self administer medication as prescribed, calls pharmacy for refills and calls providers office for new concerns or questions.  Please see past updates related to this goal by clicking on the "Past Updates" button in the selected goal         The patient verbalized understanding of  instructions provided today and declined a print copy of patient instruction materials.   Telephone follow up appointment with pharmacy team  member scheduled for: 02/2020  Sherre Poot, PharmD, [facility] Clinical Pharmacist [facility] [phone] (office) [phone] (mobile)   Asthma and Physical Activity Physical activity is an important part of a healthy lifestyle. If you have asthma, it is important to exercise because physical activity can help you to:  Control your asthma.  Maintain your weight or lose weight.  Increase your energy.  Decrease stress and anxiety.  Lower your risk of getting sick.  Improve your heart health. However, asthma symptoms can flare up when you are physically active or exercising. You can learn how to control your asthma and prevent symptoms during exercise. This will help you to remain physically active. How can asthma affect my ability to be physically active? When you have asthma, physical activity can cause you to have symptoms such as:  Wheezing. This may sound like whistling while breathing.  A feeling of tightness in the chest.  Sore throat.  Coughing.  Shortness of breath.  Tiredness (fatigue) with minimal activity.  Increased sputum production.  Chest pain. What actions can I take to prevent asthma problems during physical activity? Pulmonary rehabilitation Enroll in a pulmonary rehabilitation program. Benefits of this type of program include:  Education on lung diseases.  Classes that teach you how to exercise and be more active while decreasing your shortness of breath.  A group setting that allows you to talk with others who have asthma. Asthma action plan Follow the asthma action plan set by your health care provider. Your personal asthma plan may include:  Taking your medicines as told by your health care provider.  Avoiding your asthma triggers, except physical activity. Triggers may include cold air, dust, pollen, pet dander, and air pollution.  Tracking your asthma control.  Using a peak flow meter.  Being aware of worsening  symptoms.  Knowing when to seek emergency care. Proper breathing During exercise, follow these tips for proper breathing:  Breathe in before starting the exercise and breathe out during the hardest part of the exercise.  Take slow breaths.  Pace yourself and do not try to go too fast.  While breathing out, purse your lips. Before beginning any exercise program or new activity, talk with your health care provider. Medicines If physical activity triggers your asthma, your health care provider may order the following medicines:  A rescue inhaler (short-acting beta2-agonist) for you to use shortly before physical activity or exercise. Its effects may reduce exercise-related symptoms for 2-3 hours.  A long-acting beta2-agonist that can offer up to 12 hours of relief if taken daily.  Leukotriene modifiers. These pills are taken several hours before physical activity or exercise to help prevent asthma symptoms that are caused by exercise.  Long-term control medicines. These will be given if you have severe or frequent asthma symptoms during or after exercise. These symptoms may also mean that your asthma is not well controlled.  General information  Exercise indoors when the air is dry or during allergy season.  Try to breathe in warm, moist air by wearing a scarf over your nose and mouth or breathing only through your nose.  Spend a few minutes warming up before your workout.  Cool down after exercise. What should I do if my asthma symptoms get worse? Contact your health care provider if your asthma symptoms are getting worse. Your asthma is getting worse if:  You have symptoms more often.  Your symptoms are more severe.  Your symptoms get worse at night and make you lose sleep.  Your peak flow number is lower than your personal best or changes a lot from day to day.  Your asthma medicines do not work as well as they used to.  You use your rescue inhaler more often. If you  use your rescue inhaler more than 2 days a week, your asthma is not well controlled.  You go to the emergency room or see your health care provider because of an asthma attack. Where to find support  Ask your health care provider about signing up for a pulmonary rehabilitation program.  Ask your health care provider about asthma support groups.  Visit your local community health department.  Check out local hospitals' community health programs. Where can I get more information?  Your health care provider.  American Lung Association: lung.org  National Heart, Lung, and Blood Institute: https://www.[place].biz/ Contact a health care provider if:  You have trouble walking and talking because you are out of breath. Get help right away if:  Your lips or fingernails are blue.  You are not able to breathe or catch your breath. Summary  Physical activity is an important part of a healthy lifestyle. However, if you have asthma, your symptoms can flare up during exercise or physical activity.  You can prevent problems during physical activity by doing pulmonary rehabilitation, following an asthma action plan, doing proper breathing, and using medicines.  Talk with your health care provider before starting any exercise program or new activity. This information is not intended to replace advice given to you by your health care provider. Make sure you discuss any questions you have with your health care provider. Document Revised: 11/19/2018 Document Reviewed: 10/16/2017 Elsevier Patient Education  [place].

## 2020-01-16 NOTE — Telephone Encounter (Signed)
PA for [facility] submitted via covermymeds per "Plan", "Drug is covered by current benefit plan. No further PA activity needed"

## 2020-01-22 ENCOUNTER — Other Ambulatory Visit: Payer: Self-pay

## 2020-01-22 DIAGNOSIS — E041 Nontoxic single thyroid nodule: Secondary | ICD-10-CM

## 2020-01-23 DIAGNOSIS — R0600 Dyspnea, unspecified: Secondary | ICD-10-CM | POA: Diagnosis not present

## 2020-01-23 DIAGNOSIS — I442 Atrioventricular block, complete: Secondary | ICD-10-CM | POA: Diagnosis not present

## 2020-01-29 NOTE — Addendum Note (Signed)
Addended by: Tawny Asal I on: 01/29/2020 04:16 PM   Modules accepted: Orders

## 2020-01-30 ENCOUNTER — Other Ambulatory Visit: Payer: Self-pay

## 2020-01-30 MED ORDER — BREZTRI AEROSPHERE 160-9-4.8 MCG/ACT IN AERO: 2.0000 | INHALATION_SPRAY | Freq: Two times a day (BID) | RESPIRATORY_TRACT | 5 refills | Status: DC

## 2020-02-04 DIAGNOSIS — I519 Heart disease, unspecified: Secondary | ICD-10-CM | POA: Insufficient documentation

## 2020-02-19 DIAGNOSIS — I5032 Chronic diastolic (congestive) heart failure: Secondary | ICD-10-CM | POA: Diagnosis not present

## 2020-02-23 ENCOUNTER — Other Ambulatory Visit: Payer: Self-pay

## 2020-02-23 MED ORDER — BREZTRI AEROSPHERE 160-9-4.8 MCG/ACT IN AERO: 2.0000 | INHALATION_SPRAY | Freq: Two times a day (BID) | RESPIRATORY_TRACT | 5 refills | Status: DC

## 2020-02-24 ENCOUNTER — Ambulatory Visit: Payer: Medicare Other

## 2020-02-24 DIAGNOSIS — I10 Essential (primary) hypertension: Secondary | ICD-10-CM

## 2020-02-24 DIAGNOSIS — E782 Mixed hyperlipidemia: Secondary | ICD-10-CM

## 2020-02-24 DIAGNOSIS — E1121 Type 2 diabetes mellitus with diabetic nephropathy: Secondary | ICD-10-CM

## 2020-02-24 NOTE — Chronic Care Management (AMB) (Signed)
Chronic Care Management Pharmacy  Name: Bridget [name]  MRN: [id number] DOB: Feb 27, 1941  Chief Complaint/ HPI  Bridget [name],  79 y.o. , female presents for their Initial CCM visit with the clinical pharmacist via telephone due to COVID-19 Pandemic.  PCP : [name], MD  Their chronic conditions include: HTN, TIA, DM, Thyroid, HLD, Constipation.   Office Visits: 01/07/2020 - furosemide started to see if dyspnea improved. Check stat labs: cbc, cmp, ddimer, troponin. Increase xarelto to 15 mg one twice a day. Start breztri inhaler 2 puffs twice daily. Continue ventolin hfa 2 puffs four times a day as needed for shortness of breath. Order cxr. If DDimer is positive, I will order cta of chest in am.   11/20/2019 - starting singulair 10 mg daily for mild intermittent asthma.  07/21/2019 - Albuterol for SOB. Zenpep for flatulence. CBC normal, kidney and liver function normal.   Consult Visit: 01/09/2020 - Bridget [name] is experiencing some increase shortness of breath and dyspnea on exertion. Her recent CT scan suggested possibly some component of some edema with effusion. She has no other evidence of retaining any fluid with no lower extremity swelling and no weight gain. I agree with Dr. Tobie Poet that an initial trial of diuretics is warranted to see if this has any impact on her dyspnea. I would like to have an echocardiogram to reassess her left ventricular systolic function which has always been well-preserved. CT negative for PE.  09/24/2019 - Dr. Dwyane Dee - stop Jardiance. 09/19/2019 - Cardiology - recommended d/c of xarelto but neurologist said no due to "mini strokes".  08/28/2019 - Pacemaker check.   Medications: Outpatient Encounter Medications as of 02/24/2020  Medication Sig  . albuterol (VENTOLIN HFA) 108 (90 Base) MCG/ACT inhaler Inhale into the lungs every 6 (six) hours as needed for wheezing or shortness of breath.  Marland Kitchen amLODipine (NORVASC) 5 MG tablet Take 1 tablet by  mouth once daily  . aspirin 81 MG chewable tablet Chew 81 mg by mouth in the morning.   . Budeson-Glycopyrrol-Formoterol (BREZTRI AEROSPHERE) 160-9-4.8 MCG/ACT AERO Inhale 2 puffs into the lungs 2 (two) times daily.  . carvedilol (COREG) 25 MG tablet Take 25 mg by mouth 2 (two) times daily with a meal.  . Cholecalciferol (VITAMIN D-3) 1000 UNITS CAPS Take 1,000 Units by mouth daily.  . fenofibrate 160 MG tablet Take 1 tablet by mouth once daily  . furosemide (LASIX) 20 MG tablet Take 1 tablet (20 mg total) by mouth daily.  Marland Kitchen glucose blood (ONETOUCH ULTRA) test strip Use onetouch ultra test strips as instructed to check blood sugar once daily. DX:E11.9  . Lancets (ONETOUCH DELICA PLUS [id number]) MISC 1 each by Other route 2 (two) times daily. DX:E11.9; WILL PROVIDE 30 DAY SUPPLY. MUST CALL TO SCHEDULE AN APPT WITH LABS PRIOR TO APPT  . Magnesium 250 MG TABS Take 250 mg by mouth daily.  . metFORMIN (GLUCOPHAGE) 500 MG tablet TAKE 1 TABLET BY MOUTH TWICE DAILY WITH MORNING AND EVENING MEALS  . montelukast (SINGULAIR) 10 MG tablet Take 1 tablet (10 mg total) by mouth at bedtime. (Patient not taking: Reported on 01/06/2020)  . Multiple Vitamin (MULTI VITAMIN DAILY PO) Take by mouth daily.  . nitroGLYCERIN (NITROSTAT) 0.4 MG SL tablet Place 0.4 mg under the tongue every 5 (five) minutes as needed for chest pain.  . polyethylene glycol (MIRALAX / GLYCOLAX) packet Take 17 g by mouth daily as needed.  . Rivaroxaban (XARELTO) 15 MG TABS tablet Take 15  mg by mouth daily.  Marland Kitchen venlafaxine XR (EFFEXOR-XR) 75 MG 24 hr capsule Take 1 capsule by mouth once daily   No facility-administered encounter medications on file as of 02/24/2020.   Allergies  Allergen Reactions  . Atorvastatin Other (See Comments)    "muscle Cramps"  . Budesonide Other (See Comments)    "fatigue"  . Gabapentin Other (See Comments)    "fatigue"  . Metoclopramide Other (See Comments)    "mayalgia"  . Rosuvastatin Other (See Comments)      "muscle cramps"  . Simvastatin Other (See Comments)    "muscle Cramps"  . Sulfamethoxazole-Trimethoprim Rash    SDOH Screenings   Alcohol Screen:   . Last Alcohol Screening Score (AUDIT):   Depression (PHQ2-9): Low Risk   . PHQ-2 Score: 1  Financial Resource Strain:   . Difficulty of Paying Living Expenses:   Food Insecurity:   . Worried About Charity fundraiser in the Last Year:   . [employer] of Food in the Last Year:   Housing: [place]   . Last Housing Risk Score: 0  Physical Activity:   . Days of Exercise per Week:   . Minutes of Exercise per Session:   Social Connections:   . Frequency of Communication with Friends and Family:   . Frequency of Social Gatherings with Friends and Family:   . Attends Religious Services:   . Active Member of Clubs or Organizations:   . Attends Archivist Meetings:   Marland Kitchen Marital Status:   Stress:   . Feeling of Stress :   Tobacco Use: Low Risk   . Smoking Tobacco Use: Never Smoker  . Smokeless Tobacco Use: Never Used  Transportation Needs:   . Film/video editor (Medical):   Marland Kitchen Lack of Transportation (Non-Medical):    Allergies  Allergen Reactions  . Atorvastatin Other (See Comments)    "muscle Cramps"  . Budesonide Other (See Comments)    "fatigue"  . Gabapentin Other (See Comments)    "fatigue"  . Metoclopramide Other (See Comments)    "mayalgia"  . Rosuvastatin Other (See Comments)    "muscle cramps"  . Simvastatin Other (See Comments)    "muscle Cramps"  . Sulfamethoxazole-Trimethoprim Rash   SDOH Screenings   Alcohol Screen:   . Last Alcohol Screening Score (AUDIT):   Depression (PHQ2-9): Low Risk   . PHQ-2 Score: 1  Financial Resource Strain:   . Difficulty of Paying Living Expenses:   Food Insecurity:   . Worried About Charity fundraiser in the Last Year:   . [employer] of Food in the Last Year:   Housing: [place]   . Last Housing Risk Score: 0  Physical Activity:   . Days of Exercise per Week:    . Minutes of Exercise per Session:   Social Connections:   . Frequency of Communication with Friends and Family:   . Frequency of Social Gatherings with Friends and Family:   . Attends Religious Services:   . Active Member of Clubs or Organizations:   . Attends Archivist Meetings:   Marland Kitchen Marital Status:   Stress:   . Feeling of Stress :   Tobacco Use: Low Risk   . Smoking Tobacco Use: Never Smoker  . Smokeless Tobacco Use: Never Used  Transportation Needs:   . Film/video editor (Medical):   Marland Kitchen Lack of Transportation (Non-Medical):       Current Diagnosis/Assessment:  Goals Addressed  This Visit's Progress   . Pharmacy Care Plan - DM       CARE PLAN ENTRY (see longitudinal plan of care for additional care plan information)  Current Barriers:  . Chronic Disease Management support, education, and care coordination needs related to Hypertension, Hyperlipidemia, and Diabetes   Hypertension BP Readings from Last 3 Encounters:  01/13/20 (!) 102/58  01/06/20 122/62  11/20/19 122/66   . Pharmacist Clinical Goal(s): o Over the next 90 days, patient will work with PharmD and providers to maintain BP goal <130/80 . Current regimen:  o Carvedilol 25 mg twice daily o Furosemide 20 mg daily  . Interventions: o Patient reports good blood pressure readings at home. o Keep up the good work with diet and medication adherence.  . Patient self care activities - Over the next 90 days, patient will: o Check BP daily, document, and provide at future appointments o Ensure daily salt intake < 2300 mg/day  Hyperlipidemia Lab Results  Component Value Date/Time   LDLCALC 66 11/20/2019 09:31 AM   LDLDIRECT 94.2 12/05/2013 05:13 PM   . Pharmacist Clinical Goal(s): o Over the next 90 days, patient will work with PharmD and providers to maintain LDL goal < 70 . Current regimen:  o Diet and Lifestyle . Interventions: o Keep up the good work watching diet and  staying active as tolerated.  . Patient self care activities - Over the next 90 days, patient will: o Continue to eat heart healthy diet.   Diabetes Lab Results  Component Value Date/Time   HGBA1C 6.3 (H) 11/20/2019 09:31 AM   HGBA1C 6.0 (A) 09/24/2019 01:20 PM   HGBA1C 6.1 (A) 01/02/2019 09:15 AM   HGBA1C 5.6 02/29/2016 10:11 AM   . Pharmacist Clinical Goal(s): o Over the next 90 days, patient will work with PharmD and providers to maintain A1c goal <7% . Current regimen:  o Metformin 500 mg bid o One touch ultra test strips and lancets  . Interventions: o Keep up the good work with healthy diet and medication adherence! o Coordinated patient getting upcoming endocrinology labs drawn in [place] prior to visit with Dr. Dwyane Dee.  . Patient self care activities - Over the next 90 days, patient will: o Check blood sugar once daily, document, and provide at future appointments o Contact provider with any episodes of hypoglycemia  Medication management . Pharmacist Clinical Goal(s): o Over the next 90 days, patient will work with PharmD and providers to maintain optimal medication adherence . Current pharmacy: [facility] . Interventions o Comprehensive medication review performed. o Continue current medication management strategy . Patient self care activities - Over the next 90 days, patient will: o Focus on medication adherence by continuing to use pill box.  o Take medications as prescribed o Report any questions or concerns to PharmD and/or provider(s)  Please see past updates related to this goal by clicking on the "Past Updates" button in the selected goal      . COMPLETED: [place] (see longitudinal plan of care for additional care plan information)  Current Barriers:  . Chronic disease management support, education, and care coordination needs related to Diabetes, hypertension, hyperlipidemia.  . Current antihypertensive regimen:  carvedilol 25 mg bid, furosemide 20 mg daily . Previous antihypertensives tried: lisinopril, amlodipine, metoprolol, lisinopril-hctz . Last practice recorded BP readings:  BP Readings from Last 3 Encounters:  01/13/20 (!) 102/58  01/06/20 122/62  11/20/19 122/66 .   Marland Kitchen  Current home BP readings: 104/64,  . Most recent eGFR/CrCl: No results found for: EGFR  No components found for: CRCL  Pharmacist Clinical Goal(s):  Marland Kitchen Over the next 90 days, patient will work with PharmD to maintain blood pressure of <130/80 mmHg  Interventions: . Inter-disciplinary care team collaboration (see longitudinal plan of care) . Comprehensive medication review performed; medication list updated in the electronic medical record.  . Discussed importance of diet and exercise on blood pressure.   Patient Self Care Activities:  . Patient will continue to check BP daily, document, and provide at future appointments . Patient will focus on medication adherence by continuing to use weekly pill box. . Patient verbalizes understanding of plan to follow as described above, self administer medication as prescribed, calls pharmacy for refills and calls providers office for new concerns or questions.  Please see past updates related to this goal by clicking on the "Past Updates" button in the selected goal        Hyperlipidemia   LDL goal < 70  Lipid Panel     Component Value Date/Time   CHOL 142 11/20/2019 0931   TRIG 113 11/20/2019 0931   HDL 56 11/20/2019 0931   LDLCALC 66 11/20/2019 0931   LDLDIRECT 94.2 12/05/2013 1713    Hepatic Function Latest Ref Rng & Units 11/20/2019 01/02/2019 09/05/2017  Total Protein 6.0 - 8.5 g/dL 5.9(L) 6.2 6.4  Albumin 3.7 - 4.7 g/dL 3.8 3.6 3.8  AST 0 - 40 IU/L 33 29 38(H)  ALT 0 - 32 IU/L 19 15 21   Alk Phosphatase 39 - 117 IU/L 56 45 64  Total Bilirubin 0.0 - 1.2 mg/dL 0.4 0.5 0.5     The 10-year ASCVD risk score Mikey Bussing DC Jr., et al., 2013) is: 38.6%   Values used to  calculate the score:     Age: 88 years     Sex: Female     Is Non-Hispanic African American: No     Diabetic: Yes     Tobacco smoker: No     Systolic Blood Pressure: 932 mmHg     Is BP treated: Yes     HDL Cholesterol: 56 mg/dL     Total Cholesterol: 142 mg/dL   Patient has failed these meds in past: atorvastatin, rosuvastatin, simvastatin Patient is currently controlled on the following medications:  . Diet and Lifestyle  We discussed:  diet and exercise extensively. Patient has been unable to tolerate statin medications.   Plan  Continue control with diet and exercise   Diabetes   Recent Relevant Labs: Lab Results  Component Value Date/Time   HGBA1C 6.3 (H) 11/20/2019 09:31 AM   HGBA1C 6.0 (A) 09/24/2019 01:20 PM   HGBA1C 6.1 (A) 01/02/2019 09:15 AM   HGBA1C 5.6 02/29/2016 10:11 AM   MICROALBUR 1.8 01/02/2019 09:32 AM   MICROALBUR <0.7 01/01/2018 01:48 PM     Checking BG: Daily  Recent FBG Readings: 130-132  Patient has failed these meds in past: Jardiance 10 mg, pioglitazone 30 mg Patient is currently controlled on the following medications: metformin 500 mg bid, one touch ultra test strips, lancets  Last diabetic Foot exam: 11/20/2019   Last diabetic Eye exam:  Appointment set in June 2021  We discussed: diet and exercise extensively. Patient was eating good when she cooks at home. Lots of meat and vegetables. She hasn't felt as good in the past week and hasn't cooked as much at home. Patient loves sweets.   Update 01/16/2020 - Patient  indicates good blood sugar control. She had to stop her metformin for 48 hours around the time of her CT scan. Her blood sugars remained stable during that time as well.   Update 02/24/2020 - This week's blood sugar readings are near goal (119, 113, 115). She cooked a healthy meal last night. Patient is scheduled to see Endocrinology in August. She is going to bring her lab orders to have blood work drawn at [facility] prior to  that appointment.   Plan  Continue current medications  Hypertension   BP today is:  <130/80  Office blood pressures are  BP Readings from Last 3 Encounters:  01/13/20 (!) 102/58  01/06/20 122/62  11/20/19 122/66    Patient has failed these meds in the past: amlodipine 5 mg, lisinopril 10 mg, lisinopril-hctz 20-12.5 mg, metoprolol succinate 25 mg , furosemide 20 mg daily Patient is currently controlled on the following medications: carvedilol 25 mg bid, furosemide 20 mg daily   Patient checks BP at home daily  Patient home BP readings are ranging: 113/64 mmHg  We discussed diet and exercise extensively. Had a pacemaker 09/27/2016. Most exercise is in her house. Her legs get tired walking up the hill in her neighborhood. She stays active walking stairs in her home.   Update 01/16/2020 - When walking or going up stairs she is still experiencing SOB. She is having to rest after running errands like yesterday riding to [place] and going to a new grocery store. She indicates that her weight has dropped a few pounds since beginning furosemide. Her bp is stable and staying around 113/64 mmHg.   Update 02/24/2020 - Catherization clear. Getting device monitored remotely. Patient had a great day yesterday and felt well. Cooked good supper and strung beans. She reports no pain but very little energy. Her BP this week is 106/63, 119/72, 114/64.   Plan  Continue current medications   Hx of DVT   Patient has failed these meds in past: n/a Patient is currently controlled on the following medications: Xarelto 15 mg daily  We discussed:  Had a blood clot when the pacemaker was put in. She was started on Xarelto at that time. She takes it even though she is outside of the treatment window. She felt more comfortable staying on Xarelto. She has lots of bruising. She does enter the doughnut hole at the end of the year but doesn't think she would qualify for patient assistance. Pharmacist will  provide samples if available at the end of this year to avoid doughnut hole pricing.   Update 01/16/2020:  Patient recently completed a CT scan to rule out possible PE. She indicates that this was quite scary but the scan came back negative. She is continued on Xarelto.   Update 02/24/2020 - Patient is continued on Xarelto. Patient is having her device monitoring appointment soon. She is feeling tired and noticed a few times when she was having rapid heart beats.   Plan  Continue current medications   COPD / Asthma / Tobacco   Eosinophil count:  No results found for: EOSPCT%                               Eos (Absolute):  Lab Results  Component Value Date/Time   EOSABS 0.4 11/20/2019 09:31 AM    Tobacco Status:  Social History   Tobacco Use  Smoking Status Never Smoker  Smokeless Tobacco Never Used  Patient has failed these meds in past: n/a Patient is currently uncontrolled on the following medications: proair Using maintenance inhaler regularly? No Frequency of rescue inhaler use:  several times per month  We discussed:  Patient talked with Dr. Tobie Poet about Singulair at last visit. She has not started anything yet and pharmacy does not have script. Patient wants to make sure that the Singulair won't do anything to her blood count/kidney issue. Dr. Tobie Poet ordered medication for patient and pharmacist let patient know that it was at the pharmacy. Patient will begin and pharmacist will call next month to monitor efficacy. Patient indicates that pollen season makes breathing condition worse.   Update 01/16/2020: Judithann Sauger is improving breathing. She still has some SOB upon exertion. She feels that her lungs are much more open. She has 1 day left of Breztri but not certain if she should continue it or not. Dr. Tobie Poet did want her to continue it and 2 additional samples were provided. Patient's husband picked up medication. A prescription to [facility] is also being sent for patient.   Update  02/24/2020 - Judithann Sauger is working well for patient. She is out of samples currently. Pharmacist helped coordinate refill. Discussed the importance of continuing to use preventatively.   Plan  Continue current medications     Medication Management   Pt uses [facility] for all medications Uses pill box? Yes Pt endorses good compliance rarely misses medications.   We discussed: She gets her medicine box out each morning.   Plan  Continue current medication management strategy    Follow up: 1 month phone visit

## 2020-02-24 NOTE — Patient Instructions (Signed)
Visit Information  Goals Addressed            This Visit's Progress   . Pharmacy Care Plan - DM       CARE PLAN ENTRY (see longitudinal plan of care for additional care plan information)  Current Barriers:  . Chronic Disease Management support, education, and care coordination needs related to Hypertension, Hyperlipidemia, and Diabetes   Hypertension BP Readings from Last 3 Encounters:  01/13/20 (!) 102/58  01/06/20 122/62  11/20/19 122/66   . Pharmacist Clinical Goal(s): o Over the next 90 days, patient will work with PharmD and providers to maintain BP goal <130/80 . Current regimen:  o Carvedilol 25 mg twice daily o Furosemide 20 mg daily  . Interventions: o Patient reports good blood pressure readings at home. o Keep up the good work with diet and medication adherence.  . Patient self care activities - Over the next 90 days, patient will: o Check BP daily, document, and provide at future appointments o Ensure daily salt intake < 2300 mg/day  Hyperlipidemia Lab Results  Component Value Date/Time   LDLCALC 66 11/20/2019 09:31 AM   LDLDIRECT 94.2 12/05/2013 05:13 PM   . Pharmacist Clinical Goal(s): o Over the next 90 days, patient will work with PharmD and providers to maintain LDL goal < 70 . Current regimen:  o Diet and Lifestyle . Interventions: o Keep up the good work watching diet and staying active as tolerated.  . Patient self care activities - Over the next 90 days, patient will: o Continue to eat heart healthy diet.   Diabetes Lab Results  Component Value Date/Time   HGBA1C 6.3 (H) 11/20/2019 09:31 AM   HGBA1C 6.0 (A) 09/24/2019 01:20 PM   HGBA1C 6.1 (A) 01/02/2019 09:15 AM   HGBA1C 5.6 02/29/2016 10:11 AM   . Pharmacist Clinical Goal(s): o Over the next 90 days, patient will work with PharmD and providers to maintain A1c goal <7% . Current regimen:  o Metformin 500 mg bid o One touch ultra test strips and lancets  . Interventions: o Keep up the  good work with healthy diet and medication adherence! o Coordinated patient getting upcoming endocrinology labs drawn in [place] prior to visit with Dr. Dwyane Dee.  . Patient self care activities - Over the next 90 days, patient will: o Check blood sugar once daily, document, and provide at future appointments o Contact provider with any episodes of hypoglycemia  Medication management . Pharmacist Clinical Goal(s): o Over the next 90 days, patient will work with PharmD and providers to maintain optimal medication adherence . Current pharmacy: [facility] . Interventions o Comprehensive medication review performed. o Continue current medication management strategy . Patient self care activities - Over the next 90 days, patient will: o Focus on medication adherence by continuing to use pill box.  o Take medications as prescribed o Report any questions or concerns to PharmD and/or provider(s)  Please see past updates related to this goal by clicking on the "Past Updates" button in the selected goal      . COMPLETED: [place] (see longitudinal plan of care for additional care plan information)  Current Barriers:  . Chronic disease management support, education, and care coordination needs related to Diabetes, hypertension, hyperlipidemia.  . Current antihypertensive regimen: carvedilol 25 mg bid, furosemide 20 mg daily . Previous antihypertensives tried: lisinopril, amlodipine, metoprolol, lisinopril-hctz . Last practice recorded BP readings:  BP Readings from  Last 3 Encounters:  01/13/20 (!) 102/58  01/06/20 122/62  11/20/19 122/66 .   Marland Kitchen Current home BP readings: 104/64,  . Most recent eGFR/CrCl: No results found for: EGFR  No components found for: CRCL  Pharmacist Clinical Goal(s):  Marland Kitchen Over the next 90 days, patient will work with PharmD to maintain blood pressure of <130/80 mmHg  Interventions: . Inter-disciplinary care team collaboration (see  longitudinal plan of care) . Comprehensive medication review performed; medication list updated in the electronic medical record.  . Discussed importance of diet and exercise on blood pressure.   Patient Self Care Activities:  . Patient will continue to check BP daily, document, and provide at future appointments . Patient will focus on medication adherence by continuing to use weekly pill box. . Patient verbalizes understanding of plan to follow as described above, self administer medication as prescribed, calls pharmacy for refills and calls providers office for new concerns or questions.  Please see past updates related to this goal by clicking on the "Past Updates" button in the selected goal         The patient verbalized understanding of instructions provided today and declined a print copy of patient instruction materials.   Telephone follow up appointment with pharmacy team member scheduled for: 03/2020  Sherre Poot, PharmD, [facility] Clinical Pharmacist [facility] [phone] (office) [phone] (mobile)

## 2020-02-26 ENCOUNTER — Telehealth: Payer: Medicare Other

## 2020-02-27 DIAGNOSIS — Z45018 Encounter for adjustment and management of other part of cardiac pacemaker: Secondary | ICD-10-CM | POA: Diagnosis not present

## 2020-03-18 ENCOUNTER — Other Ambulatory Visit: Payer: Self-pay | Admitting: Physician Assistant

## 2020-03-19 DIAGNOSIS — Z9889 Other specified postprocedural states: Secondary | ICD-10-CM | POA: Insufficient documentation

## 2020-03-19 DIAGNOSIS — I471 Supraventricular tachycardia: Secondary | ICD-10-CM | POA: Diagnosis not present

## 2020-03-19 DIAGNOSIS — Z7901 Long term (current) use of anticoagulants: Secondary | ICD-10-CM | POA: Diagnosis not present

## 2020-03-19 DIAGNOSIS — I519 Heart disease, unspecified: Secondary | ICD-10-CM | POA: Diagnosis not present

## 2020-03-19 DIAGNOSIS — Z45018 Encounter for adjustment and management of other part of cardiac pacemaker: Secondary | ICD-10-CM | POA: Diagnosis not present

## 2020-03-19 DIAGNOSIS — Z86718 Personal history of other venous thrombosis and embolism: Secondary | ICD-10-CM | POA: Diagnosis not present

## 2020-03-23 ENCOUNTER — Other Ambulatory Visit: Payer: Self-pay | Admitting: *Deleted

## 2020-03-23 ENCOUNTER — Ambulatory Visit (INDEPENDENT_AMBULATORY_CARE_PROVIDER_SITE_OTHER): Payer: Medicare Other | Admitting: Endocrinology

## 2020-03-23 ENCOUNTER — Other Ambulatory Visit: Payer: Self-pay

## 2020-03-23 ENCOUNTER — Encounter: Payer: Self-pay | Admitting: Endocrinology

## 2020-03-23 VITALS — BP 100/60 | HR 73 | Ht 60.0 in | Wt 110.0 lb

## 2020-03-23 DIAGNOSIS — N289 Disorder of kidney and ureter, unspecified: Secondary | ICD-10-CM

## 2020-03-23 DIAGNOSIS — E119 Type 2 diabetes mellitus without complications: Secondary | ICD-10-CM

## 2020-03-23 DIAGNOSIS — E041 Nontoxic single thyroid nodule: Secondary | ICD-10-CM

## 2020-03-23 DIAGNOSIS — I10 Essential (primary) hypertension: Secondary | ICD-10-CM

## 2020-03-23 LAB — MICROALBUMIN / CREATININE URINE RATIO
Creatinine,U: 138 mg/dL
Microalb Creat Ratio: 0.5 mg/g (ref 0.0–30.0)
Microalb, Ur: <0.7 mg/dL (ref 0.0–1.9)

## 2020-03-23 LAB — BASIC METABOLIC PANEL
BUN: 29 mg/dL — ABNORMAL HIGH (ref 6–23)
CO2: 30 mEq/L (ref 19–32)
Calcium: 9.9 mg/dL (ref 8.4–10.5)
Chloride: 98 mEq/L (ref 96–112)
Creatinine, Ser: 1.53 mg/dL — ABNORMAL HIGH (ref 0.40–1.20)
GFR: 32.91 mL/min — ABNORMAL LOW (ref >60.00)
Glucose, Bld: 105 mg/dL — ABNORMAL HIGH (ref 70–99)
Potassium: 3.8 mEq/L (ref 3.5–5.1)
Sodium: 138 mEq/L (ref 135–145)

## 2020-03-23 LAB — POCT GLYCOSYLATED HEMOGLOBIN (HGB A1C): Hemoglobin A1C: 5.9 % — AB (ref 4.0–5.6)

## 2020-03-23 LAB — T4, FREE: Free T4: 1.28 ng/dL (ref 0.60–1.60)

## 2020-03-23 LAB — TSH: TSH: 1.64 u[IU]/mL (ref 0.35–4.50)

## 2020-03-23 MED ORDER — METFORMIN HCL 500 MG PO TABS: ORAL_TABLET | ORAL | 1 refills | Status: DC

## 2020-03-23 MED ORDER — ONETOUCH ULTRA VI STRP: ORAL_STRIP | 3 refills | Status: DC

## 2020-03-23 NOTE — Patient Instructions (Addendum)
Check blood sugars on waking up 3 days a week  Also check blood sugars about 2 hours after meals and do this after different meals by rotation  Recommended blood sugar levels on waking up are 90-130 and about 2 hours after meal is 130-160  Please bring your blood sugar monitor to each visit, thank you  Stop Fenofibrate   

## 2020-03-23 NOTE — Progress Notes (Signed)
Patient ID: Bridget [name], female   DOB: 1941-07-10, 79 y.o.   MRN: [id number]   Reason for Appointment: Diabetes follow-up   History of Present Illness   Diagnosis: Type 2 DIABETES MELITUS, date of diagnosis:  2007     Previous history: Her A1c at diagnosis was 7.9% She has been treated with a combination of metformin ER and Actos for several years Usually her A1c has been upper normal and her last A1c was 5.8 in 4/14  Recent history:  Her A1c is excellent at 5.9 now, previously 6.3   She has lost a little more weight, likely has some decreased appetite  Her fasting blood sugars are usually fairly good except higher occasionally like this morning when it was 149  She thinks it is higher if she eats more bread  Also has 1 unusually high reading of 200 after breakfast  She continues to lose weight gradually and she thinks this is from her heart failure  Has not checked readings after lunch or dinner  Not able to do any exercise because of dyspnea on exertion  Usually she is reluctant to take any brand-name medication because of cost     Oral hypoglycemic drugs:   metformin ER 500 mg, 1 tablet 2x a day         Side effects from medications:  Abdominal discomfort from high-dose metformin       Monitors blood glucose:  once a day or less.    Glucometer: One Touch.           Blood Glucose readings from monitor download:    PRE-MEAL Fasting Lunch Dinner Bedtime Overall  Glucose range:  96-149  122, 200     Mean/median:  118/114     124   Previous readings: FASTING blood sugars range 93-165 with AVERAGE 122, lunchtime 115  Meals: 3 meals per day. Usually follows a healthy diet, portions controlled             Dietician visit: Most recent: 2007            Wt Readings from Last 3 Encounters:  03/23/20 110 lb (49.9 kg)  01/13/20 112 lb 9.6 oz (51.1 kg)  01/06/20 114 lb (51.7 kg)   Lab Results  Component Value Date   HGBA1C 6.3 (H) 11/20/2019    HGBA1C 6.0 (A) 09/24/2019   HGBA1C 6.1 (A) 01/02/2019   Lab Results  Component Value Date   MICROALBUR 1.8 01/02/2019   LDLCALC 66 11/20/2019   CREATININE 0.97 11/20/2019   Other active problems discussed: See review of systems   Allergies as of 03/23/2020      Reactions   Atorvastatin Other (See Comments)   "muscle Cramps"   Budesonide Other (See Comments)   "fatigue"   Gabapentin Other (See Comments)   "fatigue"   Metoclopramide Other (See Comments)   "mayalgia"   Rosuvastatin Other (See Comments)   "muscle cramps"   Simvastatin Other (See Comments)   "muscle Cramps"   Sulfamethoxazole-trimethoprim Rash      Medication List       Accurate as of March 23, 2020  8:34 AM. If you have any questions, ask your nurse or doctor.        albuterol 108 (90 Base) MCG/ACT inhaler Commonly known as: VENTOLIN HFA Inhale into the lungs every 6 (six) hours as needed for wheezing or shortness of breath.   amLODipine 5 MG tablet Commonly known as: NORVASC Take 1 tablet by mouth  once daily   aspirin 81 MG chewable tablet Chew 81 mg by mouth in the morning.   Breztri Aerosphere 160-9-4.8 MCG/ACT Aero Generic drug: Budeson-Glycopyrrol-Formoterol Inhale 2 puffs into the lungs 2 (two) times daily.   carvedilol 25 MG tablet Commonly known as: COREG Take 25 mg by mouth 2 (two) times daily with a meal.   CENTRUM SILVER ULTRA WOMENS PO Take by mouth daily.   fenofibrate 160 MG tablet Take 1 tablet by mouth once daily   furosemide 20 MG tablet Commonly known as: LASIX Take 1 tablet (20 mg total) by mouth daily.   Magnesium 250 MG Tabs Take 250 mg by mouth daily.   metFORMIN 500 MG tablet Commonly known as: GLUCOPHAGE TAKE 1 TABLET BY MOUTH TWICE DAILY WITH MORNING AND EVENING MEALS   montelukast 10 MG tablet Commonly known as: SINGULAIR Take 1 tablet (10 mg total) by mouth at bedtime.   MULTI VITAMIN DAILY PO Take by mouth daily.   nitroGLYCERIN 0.4 MG SL  tablet Commonly known as: NITROSTAT Place 0.4 mg under the tongue every 5 (five) minutes as needed for chest pain.   OneTouch Delica Plus Lancet33G Misc 1 each by Other route 2 (two) times daily. DX:E11.9; WILL PROVIDE 30 DAY SUPPLY. MUST CALL TO SCHEDULE AN APPT WITH LABS PRIOR TO APPT   OneTouch Ultra test strip Generic drug: glucose blood Use onetouch ultra test strips as instructed to check blood sugar once daily. DX:E11.9   polyethylene glycol 17 g packet Commonly known as: MIRALAX / GLYCOLAX Take 17 g by mouth daily as needed.   Rivaroxaban 15 MG Tabs tablet Commonly known as: XARELTO Take 15 mg by mouth daily.   venlafaxine XR 75 MG 24 hr capsule Commonly known as: EFFEXOR-XR Take 1 capsule by mouth once daily   Vitamin D-3 25 MCG (1000 UT) Caps Take 1,000 Units by mouth daily.       Allergies:  Allergies  Allergen Reactions   Atorvastatin Other (See Comments)    "muscle Cramps"   Budesonide Other (See Comments)    "fatigue"   Gabapentin Other (See Comments)    "fatigue"   Metoclopramide Other (See Comments)    "mayalgia"   Rosuvastatin Other (See Comments)    "muscle cramps"   Simvastatin Other (See Comments)    "muscle Cramps"   Sulfamethoxazole-Trimethoprim Rash    Past Medical History:  Diagnosis Date   Asthma    Atrioventricular block    Diabetes mellitus without complication (HCC)    Fibromyalgia    Heart disease    Hypertension    Insomnia    Kidney disease    Kidney stones 01/12/1983   Major depression    Major depressive disorder, recurrent, mild (HCC)    Mixed hyperlipidemia    Osteoporosis 01/27/2019   Other amnesia    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 She has never used smokeless tobacco. She reports that she does not drink alcohol and does not use drugs.  Review of Systems:  Hypertension:  blood pressure is treated with Coreg, amlodipine and, treated by other physicians  She does check her blood pressure at home Blood pressure has been relatively lower  She has a history of CAD followed by cardiologist, recently had cardiac catheterization  Lipids: She has had hypertriglyceridemia controlled with fenofibrate Currently not on a statin drug  Lab Results  Component Value Date   CHOL 142 11/20/2019   CHOL 147 05/06/2018   CHOL 138 09/05/2017   Lab Results  Component Value Date   HDL 56 11/20/2019   HDL 54 05/06/2018   HDL 59.80 09/05/2017   Lab Results  Component Value Date   LDLCALC 66 11/20/2019   LDLCALC 68 05/06/2018   LDLCALC 57 09/05/2017   Lab Results  Component Value Date   TRIG 113 11/20/2019   TRIG 124 05/06/2018   TRIG 106.0 09/05/2017   Lab Results  Component Value Date   CHOLHDL 2.5 11/20/2019   CHOLHDL 2 09/05/2017   CHOLHDL 3 06/01/2015   Lab Results  Component Value Date   LDLDIRECT 94.2 12/05/2013      THYROID:  She was previously on thyroid suppression because of her long-standing benign thyroid nodule.  She has had 2 biopsies done on this previously Because of low normal TSH her 50 mcg Synthroid was stopped in 10/14 TSH has been consistently normal subsequently  Her ultrasound last showed mostly cystic nodule in the right side about 1.6 cm  Lab Results  Component Value Date   TSH 1.40 01/02/2019   TSH 1.26 09/05/2017   TSH  1.70 01/18/2017   Last foot exam: 12/2018    Examination:   BP 100/60 (BP Location: Left Arm, Patient Position: Sitting, Cuff Size: Normal)    Pulse 73    Ht 5' (1.524 m)    Wt 110 lb (49.9 kg)    SpO2 98%    BMI 21.48 kg/m   Body mass index is 21.48 kg/m.   No thyroid enlargement or nodules felt on either side  No lymphadenopathy in the neck  No ankle edema  ASSESSMENT/ PLAN:   Diabetes type 2 on metformin 500 mg twice daily monotherapy  Her blood sugars are well controlled A1c is still normal at 5.9  She appears to be losing weight gradually Blood sugars at home are usually fairly good except rarely in the morning However she does need to start checking readings alternating fasting and after meals and this was discussed again She has limited ability to walk or exercise  Hypertension: Well-controlled  Renal function: Her last creatinine was relatively higher last month with her cardiologist at 1.38 and will recheck it today.  She is also followed by nephrologist  LIPIDS: Consistently controlled, only has had triglycerides increase in the past and LDL is normal Likely with her weight loss she may not need fenofibrate now but would consider adding a statin drug prophylactically To stop fenofibrate and reassess on the next visit   History of thyroid nodule: She has a small stable nodule which is nonpalpable now  Thyroid levels will be rechecked  Follow-up in 3 months  There are no Patient Instructions on file for this visit.   Bridget [name] 03/23/2020, 8:34 AM

## 2020-03-25 ENCOUNTER — Encounter: Payer: Self-pay | Admitting: *Deleted

## 2020-04-06 ENCOUNTER — Other Ambulatory Visit: Payer: Self-pay

## 2020-04-06 MED ORDER — BREZTRI AEROSPHERE 160-9-4.8 MCG/ACT IN AERO: 2.0000 | INHALATION_SPRAY | Freq: Two times a day (BID) | RESPIRATORY_TRACT | 5 refills | Status: DC

## 2020-04-15 DIAGNOSIS — N189 Chronic kidney disease, unspecified: Secondary | ICD-10-CM | POA: Diagnosis not present

## 2020-04-15 DIAGNOSIS — D631 Anemia in chronic kidney disease: Secondary | ICD-10-CM | POA: Diagnosis not present

## 2020-04-15 DIAGNOSIS — N182 Chronic kidney disease, stage 2 (mild): Secondary | ICD-10-CM | POA: Diagnosis not present

## 2020-04-22 ENCOUNTER — Other Ambulatory Visit: Payer: Self-pay | Admitting: Endocrinology

## 2020-04-22 DIAGNOSIS — E119 Type 2 diabetes mellitus without complications: Secondary | ICD-10-CM

## 2020-04-28 ENCOUNTER — Other Ambulatory Visit: Payer: Self-pay

## 2020-04-28 ENCOUNTER — Ambulatory Visit: Payer: Medicare Other

## 2020-04-28 DIAGNOSIS — E782 Mixed hyperlipidemia: Secondary | ICD-10-CM

## 2020-04-28 DIAGNOSIS — E1121 Type 2 diabetes mellitus with diabetic nephropathy: Secondary | ICD-10-CM

## 2020-04-28 DIAGNOSIS — I10 Essential (primary) hypertension: Secondary | ICD-10-CM

## 2020-04-28 NOTE — Patient Instructions (Addendum)
Visit Information  Goals Addressed            This Visit's Progress   . Pharmacy Care Plan       CARE PLAN ENTRY (see longitudinal plan of care for additional care plan information)  Current Barriers:  . Chronic Disease Management support, education, and care coordination needs related to Hypertension, Hyperlipidemia, and Diabetes   Hypertension BP Readings from Last 3 Encounters:  01/13/20 (!) 102/58  01/06/20 122/62  11/20/19 122/66   . Pharmacist Clinical Goal(s): o Over the next 90 days, patient will work with PharmD and providers to maintain BP goal <130/80 . Current regimen:  o Carvedilol 37.5 mg twice daily o Furosemide 20 mg daily  . Interventions: o Patient reports good blood pressure readings at home. o Keep up the good work with diet and medication adherence.  . Patient self care activities - Over the next 90 days, patient will: o Check BP daily, document, and provide at future appointments o Ensure daily salt intake < 2300 mg/day  Hyperlipidemia Lab Results  Component Value Date/Time   LDLCALC 66 11/20/2019 09:31 AM   LDLDIRECT 94.2 12/05/2013 05:13 PM   . Pharmacist Clinical Goal(s): o Over the next 90 days, patient will work with PharmD and providers to maintain LDL goal < 70 . Current regimen:  o Diet and Lifestyle . Interventions: o Keep up the good work watching diet and staying active as tolerated.  o Reviewed fenofibrate discontinued by endocrinology.  . Patient self care activities - Over the next 90 days, patient will: o Continue to eat heart healthy diet.   Diabetes Lab Results  Component Value Date/Time   HGBA1C 6.3 (H) 11/20/2019 09:31 AM   HGBA1C 6.0 (A) 09/24/2019 01:20 PM   HGBA1C 6.1 (A) 01/02/2019 09:15 AM   HGBA1C 5.6 02/29/2016 10:11 AM   . Pharmacist Clinical Goal(s): o Over the next 90 days, patient will work with PharmD and providers to maintain A1c goal <7% . Current regimen:  o Metformin 500 mg bid o One touch ultra test  strips and lancets  . Interventions: o Keep up the good work with healthy diet and medication adherence! o Coordinated patient getting upcoming endocrinology labs drawn in [place] prior to visit with Dr. Lucianne Muss.  . Patient self care activities - Over the next 90 days, patient will: o Check blood sugar once daily, document, and provide at future appointments o Contact provider with any episodes of hypoglycemia  Medication management . Pharmacist Clinical Goal(s): o Over the next 90 days, patient will work with PharmD and providers to maintain optimal medication adherence . Current pharmacy: [facility] . Interventions o Comprehensive medication review performed. o Continue current medication management strategy o Discussed cost of Xarelto and Breztri during patient assistance. Patient will let pharmacist know if she wants to proceed with patient assistance.  . Patient self care activities - Over the next 90 days, patient will: o Focus on medication adherence by continuing to use pill box.  o Take medications as prescribed o Report any questions or concerns to PharmD and/or provider(s)  Please see past updates related to this goal by clicking on the "Past Updates" button in the selected goal         The patient verbalized understanding of instructions provided today and declined a print copy of patient instruction materials.   Telephone follow up appointment with pharmacy team member scheduled for: 07/2020  Juliane Lack, PharmD, [facility] Clinical Pharmacist [facility] [phone] (office) [phone] (  mobile)   Blood Glucose Monitoring, Adult Monitoring your blood sugar (glucose) is an important part of managing your diabetes (diabetes mellitus). Blood glucose monitoring involves checking your blood glucose as often as directed and keeping a record (log) of your results over time. Checking your blood glucose regularly and keeping a blood glucose log can:  Help you and  your health care provider adjust your diabetes management plan as needed, including your medicines or insulin.  Help you understand how food, exercise, illnesses, and medicines affect your blood glucose.  Let you know what your blood glucose is at any time. You can quickly find out if you have low blood glucose (hypoglycemia) or high blood glucose (hyperglycemia). Your health care provider will set individualized treatment goals for you. Your goals will be based on your age, other medical conditions you have, and how you respond to diabetes treatment. Generally, the goal of treatment is to maintain the following blood glucose levels:  Before meals (preprandial): 80-130 mg/dL (9.9-2.4 mmol/L).  After meals (postprandial): below 180 mg/dL (10 mmol/L).  A1c level: less than 7%. Supplies needed:  Blood glucose meter.  Test strips for your meter. Each meter has its own strips. You must use the strips that came with your meter.  A needle to prick your finger (lancet). Do not use a lancet more than one time.  A device that holds the lancet (lancing device).  A journal or log book to write down your results. How to check your blood glucose  1. Wash your hands with soap and water. 2. Prick the side of your finger (not the tip) with the lancet. Use a different finger each time. 3. Gently rub the finger until a small drop of blood appears. 4. Follow instructions that come with your meter for inserting the test strip, applying blood to the strip, and using your blood glucose meter. 5. Write down your result and any notes. Some meters allow you to use areas of your body other than your finger (alternative sites) to test your blood. The most common alternative sites are:  Forearm.  Thigh.  Palm of the hand. If you think you may have hypoglycemia, or if you have a history of not knowing when your blood glucose is getting low (hypoglycemia unawareness), do not use alternative sites. Use your  finger instead. Alternative sites may not be as accurate as the fingers, because blood flow is slower in these areas. This means that the result you get may be delayed, and it may be different from the result that you would get from your finger. Follow these instructions at home: Blood glucose log   Every time you check your blood glucose, write down your result. Also write down any notes about things that may be affecting your blood glucose, such as your diet and exercise for the day. This information can help you and your health care provider: ? Look for patterns in your blood glucose over time. ? Adjust your diabetes management plan as needed.  Check if your meter allows you to download your records to a computer. Most glucose meters store a record of glucose readings in the meter. If you have type 1 diabetes:  Check your blood glucose 2 or more times a day.  Also check your blood glucose: ? Before every insulin injection. ? Before and after exercise. ? Before meals. ? 2 hours after a meal. ? Occasionally between 2:00 a.m. and 3:00 a.m., as directed. ? Before potentially dangerous tasks, like driving  or using heavy machinery. ? At bedtime.  You may need to check your blood glucose more often, up to 6-10 times a day, if you: ? Use an insulin pump. ? Need multiple daily injections (MDI). ? Have diabetes that is not well-controlled. ? Are ill. ? Have a history of severe hypoglycemia. ? Have hypoglycemia unawareness. If you have type 2 diabetes:  If you take insulin or other diabetes medicines, check your blood glucose 2 or more times a day.  If you are on intensive insulin therapy, check your blood glucose 4 or more times a day. Occasionally, you may also need to check between 2:00 a.m. and 3:00 a.m., as directed.  Also check your blood glucose: ? Before and after exercise. ? Before potentially dangerous tasks, like driving or using heavy machinery.  You may need to check your  blood glucose more often if: ? Your medicine is being adjusted. ? Your diabetes is not well-controlled. ? You are ill. General tips  Always keep your supplies with you.  If you have questions or need help, all blood glucose meters have a 24-hour "hotline" phone number that you can call. You may also contact your health care provider.  After you use a few boxes of test strips, adjust (calibrate) your blood glucose meter by following instructions that came with your meter. Contact a health care provider if:  Your blood glucose is at or above 240 mg/dL (16.1 mmol/L) for 2 days in a row.  You have been sick or have had a fever for 2 days or longer, and you are not getting better.  You have any of the following problems for more than 6 hours: ? You cannot eat or drink. ? You have nausea or vomiting. ? You have diarrhea. Get help right away if:  Your blood glucose is lower than 54 mg/dL (3 mmol/L).  You become confused or you have trouble thinking clearly.  You have difficulty breathing.  You have moderate or large ketone levels in your urine. Summary  Monitoring your blood sugar (glucose) is an important part of managing your diabetes (diabetes mellitus).  Blood glucose monitoring involves checking your blood glucose as often as directed and keeping a record (log) of your results over time.  Your health care provider will set individualized treatment goals for you. Your goals will be based on your age, other medical conditions you have, and how you respond to diabetes treatment.  Every time you check your blood glucose, write down your result. Also write down any notes about things that may be affecting your blood glucose, such as your diet and exercise for the day. This information is not intended to replace advice given to you by your health care provider. Make sure you discuss any questions you have with your health care provider. Document Revised: 05/24/2018 Document Reviewed:  01/10/2016 Elsevier Patient Education  2020 [employer].

## 2020-04-28 NOTE — Chronic Care Management (AMB) (Signed)
Chronic Care Management Pharmacy  Name: Bridget [name]  MRN: [id number] DOB: Jan 16, 1941  Chief Complaint/ HPI  Bridget [name],  79 y.o. , female presents for their Initial CCM visit with the clinical pharmacist via telephone due to COVID-19 Pandemic.  PCP : [name], MD  Their chronic conditions include: HTN, TIA, DM, Thyroid, HLD, Constipation.   Office Visits: 01/07/2020 - furosemide started to see if dyspnea improved. Check stat labs: cbc, cmp, ddimer, troponin. Increase xarelto to 15 mg one twice a day. Start breztri inhaler 2 puffs twice daily. Continue ventolin hfa 2 puffs four times a day as needed for shortness of breath. Order cxr. If DDimer is positive, I will order cta of chest in am.   11/20/2019 - starting singulair 10 mg daily for mild intermittent asthma.  07/21/2019 - Albuterol for SOB. Zenpep for flatulence. CBC normal, kidney and liver function normal.   Consult Visit: 03/23/2020 - stop fenofibrate and reassess at next visit.  03/19/2020  - Cardiology - increase carvedilol to 37.5 mg bid.  01/09/2020 - Bridget [name] is experiencing some increase shortness of breath and dyspnea on exertion. Her recent CT scan suggested possibly some component of some edema with effusion. She has no other evidence of retaining any fluid with no lower extremity swelling and no weight gain. I agree with Dr. Tobie Poet that an initial trial of diuretics is warranted to see if this has any impact on her dyspnea. I would like to have an echocardiogram to reassess her left ventricular systolic function which has always been well-preserved. CT negative for PE.  09/24/2019 - Dr. Dwyane Dee - stop Jardiance. 09/19/2019 - Cardiology - recommended d/c of xarelto but neurologist said no due to "mini strokes".  08/28/2019 - Pacemaker check.   Medications: Outpatient Encounter Medications as of 04/28/2020  Medication Sig  . albuterol (VENTOLIN HFA) 108 (90 Base) MCG/ACT inhaler Inhale into the  lungs every 6 (six) hours as needed for wheezing or shortness of breath.  . Budeson-Glycopyrrol-Formoterol (BREZTRI AEROSPHERE) 160-9-4.8 MCG/ACT AERO Inhale 2 puffs into the lungs 2 (two) times daily.  . carvedilol (COREG) 25 MG tablet Take 25 mg by mouth 2 (two) times daily with a meal. Taking 37.5 mg bid  . glucose blood (ONETOUCH ULTRA) test strip USE 1 STRIP TO CHECK GLUCOSE ONCE DAILY  . metFORMIN (GLUCOPHAGE) 500 MG tablet TAKE 1 TABLET BY MOUTH TWICE DAILY WITH MORNING AND EVENING MEALS  . amLODipine (NORVASC) 5 MG tablet Take 1 tablet by mouth once daily  . aspirin 81 MG chewable tablet Chew 81 mg by mouth in the morning.   . Cholecalciferol (VITAMIN D-3) 1000 UNITS CAPS Take 1,000 Units by mouth daily.  . fenofibrate 160 MG tablet Take 1 tablet by mouth once daily (Patient not taking: Reported on 04/28/2020)  . furosemide (LASIX) 20 MG tablet Take 1 tablet (20 mg total) by mouth daily.  . Lancets (ONETOUCH DELICA PLUS CVELFY10F) MISC 1 each by Other route 2 (two) times daily. DX:E11.9; WILL PROVIDE 30 DAY SUPPLY. MUST CALL TO SCHEDULE AN APPT WITH LABS PRIOR TO APPT  . Magnesium 250 MG TABS Take 250 mg by mouth daily.  . montelukast (SINGULAIR) 10 MG tablet Take 1 tablet (10 mg total) by mouth at bedtime. (Patient not taking: Reported on 03/23/2020)  . Multiple Vitamin (MULTI VITAMIN DAILY PO) Take by mouth daily.  . Multiple Vitamins-Minerals (CENTRUM SILVER ULTRA WOMENS PO) Take by mouth daily.  . nitroGLYCERIN (NITROSTAT) 0.4 MG SL tablet Place 0.4  mg under the tongue every 5 (five) minutes as needed for chest pain.  . polyethylene glycol (MIRALAX / GLYCOLAX) packet Take 17 g by mouth daily as needed.  . Rivaroxaban (XARELTO) 15 MG TABS tablet Take 15 mg by mouth daily.  Marland Kitchen venlafaxine XR (EFFEXOR-XR) 75 MG 24 hr capsule Take 1 capsule by mouth once daily   No facility-administered encounter medications on file as of 04/28/2020.   Allergies  Allergen Reactions  . Atorvastatin Other  (See Comments)    "muscle Cramps"  . Budesonide Other (See Comments)    "fatigue"  . Gabapentin Other (See Comments)    "fatigue"  . Metoclopramide Other (See Comments)    "mayalgia"  . Rosuvastatin Other (See Comments)    "muscle cramps"  . Simvastatin Other (See Comments)    "muscle Cramps"  . Sulfamethoxazole-Trimethoprim Rash    SDOH Screenings   Alcohol Screen:   . Last Alcohol Screening Score (AUDIT): Not on file  Depression (PHQ2-9): Low Risk   . PHQ-2 Score: 1  Financial Resource Strain:   . Difficulty of Paying Living Expenses: Not on file  Food Insecurity:   . Worried About Charity fundraiser in the Last Year: Not on file  . Ran Out of Food in the Last Year: Not on file  Housing: Low Risk   . Last Housing Risk Score: 0  Physical Activity:   . Days of Exercise per Week: Not on file  . Minutes of Exercise per Session: Not on file  Social Connections:   . Frequency of Communication with Friends and Family: Not on file  . Frequency of Social Gatherings with Friends and Family: Not on file  . Attends Religious Services: Not on file  . Active Member of Clubs or Organizations: Not on file  . Attends Archivist Meetings: Not on file  . Marital Status: Not on file  Stress:   . Feeling of Stress : Not on file  Tobacco Use: Low Risk   . Smoking Tobacco Use: Never Smoker  . Smokeless Tobacco Use: Never Used  Transportation Needs:   . Film/video editor (Medical): Not on file  . Lack of Transportation (Non-Medical): Not on file   Allergies  Allergen Reactions  . Atorvastatin Other (See Comments)    "muscle Cramps"  . Budesonide Other (See Comments)    "fatigue"  . Gabapentin Other (See Comments)    "fatigue"  . Metoclopramide Other (See Comments)    "mayalgia"  . Rosuvastatin Other (See Comments)    "muscle cramps"  . Simvastatin Other (See Comments)    "muscle Cramps"  . Sulfamethoxazole-Trimethoprim Rash   SDOH Screenings   Alcohol  Screen:   . Last Alcohol Screening Score (AUDIT): Not on file  Depression (PHQ2-9): Low Risk   . PHQ-2 Score: 1  Financial Resource Strain:   . Difficulty of Paying Living Expenses: Not on file  Food Insecurity:   . Worried About Charity fundraiser in the Last Year: Not on file  . Ran Out of Food in the Last Year: Not on file  Housing: Low Risk   . Last Housing Risk Score: 0  Physical Activity:   . Days of Exercise per Week: Not on file  . Minutes of Exercise per Session: Not on file  Social Connections:   . Frequency of Communication with Friends and Family: Not on file  . Frequency of Social Gatherings with Friends and Family: Not on file  . Attends Religious Services: Not  on file  . Active Member of Clubs or Organizations: Not on file  . Attends Archivist Meetings: Not on file  . Marital Status: Not on file  Stress:   . Feeling of Stress : Not on file  Tobacco Use: Low Risk   . Smoking Tobacco Use: Never Smoker  . Smokeless Tobacco Use: Never Used  Transportation Needs:   . Film/video editor (Medical): Not on file  . Lack of Transportation (Non-Medical): Not on file      Current Diagnosis/Assessment:  Goals Addressed            This Visit's Progress   . Pharmacy Care Plan       CARE PLAN ENTRY (see longitudinal plan of care for additional care plan information)  Current Barriers:  . Chronic Disease Management support, education, and care coordination needs related to Hypertension, Hyperlipidemia, and Diabetes   Hypertension BP Readings from Last 3 Encounters:  01/13/20 (!) 102/58  01/06/20 122/62  11/20/19 122/66   . Pharmacist Clinical Goal(s): o Over the next 90 days, patient will work with PharmD and providers to maintain BP goal <130/80 . Current regimen:  o Carvedilol 37.5 mg twice daily o Furosemide 20 mg daily  . Interventions: o Patient reports good blood pressure readings at home. o Keep up the good work with diet and  medication adherence.  . Patient self care activities - Over the next 90 days, patient will: o Check BP daily, document, and provide at future appointments o Ensure daily salt intake < 2300 mg/day  Hyperlipidemia Lab Results  Component Value Date/Time   LDLCALC 66 11/20/2019 09:31 AM   LDLDIRECT 94.2 12/05/2013 05:13 PM   . Pharmacist Clinical Goal(s): o Over the next 90 days, patient will work with PharmD and providers to maintain LDL goal < 70 . Current regimen:  o Diet and Lifestyle . Interventions: o Keep up the good work watching diet and staying active as tolerated.  o Reviewed fenofibrate discontinued by endocrinology.  . Patient self care activities - Over the next 90 days, patient will: o Continue to eat heart healthy diet.   Diabetes Lab Results  Component Value Date/Time   HGBA1C 6.3 (H) 11/20/2019 09:31 AM   HGBA1C 6.0 (A) 09/24/2019 01:20 PM   HGBA1C 6.1 (A) 01/02/2019 09:15 AM   HGBA1C 5.6 02/29/2016 10:11 AM   . Pharmacist Clinical Goal(s): o Over the next 90 days, patient will work with PharmD and providers to maintain A1c goal <7% . Current regimen:  o Metformin 500 mg bid o One touch ultra test strips and lancets  . Interventions: o Keep up the good work with healthy diet and medication adherence! o Coordinated patient getting upcoming endocrinology labs drawn in [place] prior to visit with Dr. Dwyane Dee.  . Patient self care activities - Over the next 90 days, patient will: o Check blood sugar once daily, document, and provide at future appointments o Contact provider with any episodes of hypoglycemia  Medication management . Pharmacist Clinical Goal(s): o Over the next 90 days, patient will work with PharmD and providers to maintain optimal medication adherence . Current pharmacy: [facility] . Interventions o Comprehensive medication review performed. o Continue current medication management strategy o Discussed cost of Xarelto and Breztri during patient  assistance. Patient will let pharmacist know if she wants to proceed with patient assistance.  . Patient self care activities - Over the next 90 days, patient will: o Focus on medication adherence by continuing to use  pill box.  o Take medications as prescribed o Report any questions or concerns to PharmD and/or provider(s)  Please see past updates related to this goal by clicking on the "Past Updates" button in the selected goal        Hyperlipidemia   LDL goal < 70  Lipid Panel     Component Value Date/Time   CHOL 142 11/20/2019 0931   TRIG 113 11/20/2019 0931   HDL 56 11/20/2019 0931   LDLCALC 66 11/20/2019 0931   LDLDIRECT 94.2 12/05/2013 1713    Hepatic Function Latest Ref Rng & Units 11/20/2019 01/02/2019 09/05/2017  Total Protein 6.0 - 8.5 g/dL 5.9(L) 6.2 6.4  Albumin 3.7 - 4.7 g/dL 3.8 3.6 3.8  AST 0 - 40 IU/L 33 29 38(H)  ALT 0 - 32 IU/L 19 15 21   Alk Phosphatase 39 - 117 IU/L 56 45 64  Total Bilirubin 0.0 - 1.2 mg/dL 0.4 0.5 0.5     The 10-year ASCVD risk score Mikey Bussing DC Jr., et al., 2013) is: 29.4%   Values used to calculate the score:     Age: 36 years     Sex: Female     Is Non-Hispanic African American: No     Diabetic: Yes     Tobacco smoker: No     Systolic Blood Pressure: 759 mmHg     Is BP treated: Yes     HDL Cholesterol: 56 mg/dL     Total Cholesterol: 142 mg/dL   Patient has failed these meds in past: atorvastatin, rosuvastatin, simvastatin Patient is currently controlled on the following medications:  . Diet and Lifestyle  We discussed:  diet and exercise extensively. Patient has been unable to tolerate statin medications.   Update 04/28/2020 - Patient was stopped on fenofibrate by Endocrinology. She states that the MD said her cholesterol was low enough and she could stop it. She has not tolerated statins in the past due to muscle pain and cramping. She would consider a trial of once weekly Crestor if needed after next labs.   Plan  Continue  control with diet and exercise   Diabetes   Recent Relevant Labs: Lab Results  Component Value Date/Time   HGBA1C 5.9 (A) 03/23/2020 08:35 AM   HGBA1C 6.3 (H) 11/20/2019 09:31 AM   HGBA1C 6.0 (A) 09/24/2019 01:20 PM   HGBA1C 5.6 02/29/2016 10:11 AM   MICROALBUR <0.7 03/23/2020 10:49 AM   MICROALBUR 1.8 01/02/2019 09:32 AM     Checking BG: Daily  Recent FBG Readings: 130-132  Patient has failed these meds in past: Jardiance 10 mg, pioglitazone 30 mg Patient is currently controlled on the following medications: metformin 500 mg bid, one touch ultra test strips, lancets  Last diabetic Foot exam: 11/20/2019   Last diabetic Eye exam:  Appointment set in June 2021  We discussed: diet and exercise extensively. Patient was eating good when she cooks at home. Lots of meat and vegetables. She hasn't felt as good in the past week and hasn't cooked as much at home. Patient loves sweets.   Update 01/16/2020 - Patient indicates good blood sugar control. She had to stop her metformin for 48 hours around the time of her CT scan. Her blood sugars remained stable during that time as well.   Update 02/24/2020 - This week's blood sugar readings are near goal (119, 113, 115). She cooked a healthy meal last night. Patient is scheduled to see Endocrinology in August. She is going to bring her lab orders  to have blood work drawn at [facility] prior to that appointment.   Update 04/28/2020 - Endocrinology recommended rotating times of the day checking blood sugar. Blood sugar this week hasn't been as good but patient blames eating raisin cakes. Recent readings 125, 133, 124, 134. She is going to stop eating the raisin cakes. Insurance only pays for 1 test strip per day. After meal readings was 108, 116, 135, 120  Mg/dL when rotated. Discussed patient's recent increased exercise and diet. .   Plan  Continue current medications  Hypertension   BP today is:  <130/80  Office blood pressures are  BP  Readings from Last 3 Encounters:  03/23/20 100/60  01/13/20 (!) 102/58  01/06/20 122/62    Patient has failed these meds in the past: amlodipine 5 mg, lisinopril 10 mg, lisinopril-hctz 20-12.5 mg, metoprolol succinate 25 mg , furosemide 20 mg daily Patient is currently controlled on the following medications: carvedilol 37.5 mg bid, furosemide 20 mg daily   Patient checks BP at home daily  Patient home BP readings are ranging: 113/64 mmHg  We discussed diet and exercise extensively. Had a pacemaker 09/27/2016. Most exercise is in her house. Her legs get tired walking up the hill in her neighborhood. She stays active walking stairs in her home.   Update 01/16/2020 - When walking or going up stairs she is still experiencing SOB. She is having to rest after running errands like yesterday riding to [place] and going to a new grocery store. She indicates that her weight has dropped a few pounds since beginning furosemide. Her bp is stable and staying around 113/64 mmHg.   Update 02/24/2020 - Catherization clear. Getting device monitored remotely. Patient had a great day yesterday and felt well. Cooked good supper and strung beans. She reports no pain but very little energy. Her BP this week is 106/63, 119/72, 114/64.   Update 09/15/ 2021 - Blood pressure has been a little higher and patient feels better on those days. Blood pressure 120/79, 118/63, 121/78.  Pulse 71. Patient's carvedilol was increased by cardiology.   Plan  Continue current medications   Hx of DVT   Patient has failed these meds in past: n/a Patient is currently controlled on the following medications: Xarelto 15 mg daily  We discussed:  Had a blood clot when the pacemaker was put in. She was started on Xarelto at that time. She takes it even though she is outside of the treatment window. She felt more comfortable staying on Xarelto. She has lots of bruising. She does enter the doughnut hole at the end of the year but  doesn't think she would qualify for patient assistance. Pharmacist will provide samples if available at the end of this year to avoid doughnut hole pricing.   Update 01/16/2020:  Patient recently completed a CT scan to rule out possible PE. She indicates that this was quite scary but the scan came back negative. She is continued on Xarelto.   Update 02/24/2020 - Patient is continued on Xarelto. Patient is having her device monitoring appointment soon. She is feeling tired and noticed a few times when she was having rapid heart beats.   Update 04/28/2020 - Xarelto is expensive due to doughnut hole. Patient is going to talk to husband about applying for assistance. Pharmacist coordinated samples of Xarelto. Pharmacist will assist with application process if patient and husband choose.   Plan  Continue current medications   COPD / Asthma / Tobacco   Eosinophil count:  No results found for: EOSPCT%                               Eos (Absolute):  Lab Results  Component Value Date/Time   EOSABS 0.4 11/20/2019 09:31 AM    Tobacco Status:  Social History   Tobacco Use  Smoking Status Never Smoker  Smokeless Tobacco Never Used    Patient has failed these meds in past: n/a Patient is currently uncontrolled on the following medications: proair Using maintenance inhaler regularly? No Frequency of rescue inhaler use:  several times per month  We discussed:  Patient talked with Dr. Tobie Poet about Singulair at last visit. She has not started anything yet and pharmacy does not have script. Patient wants to make sure that the Singulair won't do anything to her blood count/kidney issue. Dr. Tobie Poet ordered medication for patient and pharmacist let patient know that it was at the pharmacy. Patient will begin and pharmacist will call next month to monitor efficacy. Patient indicates that pollen season makes breathing condition worse.   Update 01/16/2020: Judithann Sauger is improving breathing. She still has some SOB  upon exertion. She feels that her lungs are much more open. She has 1 day left of Breztri but not certain if she should continue it or not. Dr. Tobie Poet did want her to continue it and 2 additional samples were provided. Patient's husband picked up medication. A prescription to [facility] is also being sent for patient.   Update 02/24/2020 - Judithann Sauger is working well for patient. She is out of samples currently. Pharmacist helped coordinate refill. Discussed the importance of continuing to use preventatively.   Update 04/28/2020 - Judithann Sauger is very expensive right now due to doughnut hole. Discussed options of patient assistance. Pharmacist will coordinate patient assistance if patient approves. No samples available at this time.  Walking 10 laps around driveway without shortness of breath since using Breztri regularly.    Plan  Continue current medications    Vaccines   Reviewed and discussed patient's vaccination history.    Immunization History  Administered Date(s) Administered  . Influenza, High Dose Seasonal PF 06/05/2017  . Influenza,trivalent, recombinat, inj, PF 05/14/2012  . Influenza-Unspecified 07/02/2014, 04/13/2015, 04/24/2016, 06/14/2018  . PFIZER SARS-COV-2 Vaccination 08/29/2019, 09/19/2019  . Pneumococcal Conjugate-13 04/27/2014  . Pneumococcal Polysaccharide-23 07/14/2010, 07/14/2010, 12/02/2015  . Tdap 11/01/2017    Plan  Recommended patient receive annual flu vaccine in office.    Medication Management   Pt uses [facility] for all medications Uses pill box? Yes Pt endorses good compliance rarely misses medications.   We discussed: She gets her medicine box out each morning.   Plan  Continue current medication management strategy    Follow up: 3 month phone visit

## 2020-05-13 DIAGNOSIS — I083 Combined rheumatic disorders of mitral, aortic and tricuspid valves: Secondary | ICD-10-CM | POA: Diagnosis not present

## 2020-05-13 DIAGNOSIS — I313 Pericardial effusion (noninflammatory): Secondary | ICD-10-CM | POA: Diagnosis not present

## 2020-05-20 ENCOUNTER — Encounter: Payer: Self-pay | Admitting: Family Medicine

## 2020-05-20 ENCOUNTER — Ambulatory Visit (INDEPENDENT_AMBULATORY_CARE_PROVIDER_SITE_OTHER): Payer: Medicare Other | Admitting: Family Medicine

## 2020-05-20 ENCOUNTER — Other Ambulatory Visit: Payer: Self-pay

## 2020-05-20 VITALS — BP 120/62 | HR 74 | Temp 96.7°F | Resp 16 | Ht 60.0 in | Wt 110.2 lb

## 2020-05-20 DIAGNOSIS — E1121 Type 2 diabetes mellitus with diabetic nephropathy: Secondary | ICD-10-CM | POA: Diagnosis not present

## 2020-05-20 DIAGNOSIS — F330 Major depressive disorder, recurrent, mild: Secondary | ICD-10-CM | POA: Diagnosis not present

## 2020-05-20 DIAGNOSIS — I5021 Acute systolic (congestive) heart failure: Secondary | ICD-10-CM

## 2020-05-20 DIAGNOSIS — E782 Mixed hyperlipidemia: Secondary | ICD-10-CM

## 2020-05-20 DIAGNOSIS — Z23 Encounter for immunization: Secondary | ICD-10-CM | POA: Diagnosis not present

## 2020-05-20 DIAGNOSIS — J4520 Mild intermittent asthma, uncomplicated: Secondary | ICD-10-CM

## 2020-05-20 DIAGNOSIS — J301 Allergic rhinitis due to pollen: Secondary | ICD-10-CM | POA: Diagnosis not present

## 2020-05-20 DIAGNOSIS — G720 Drug-induced myopathy: Secondary | ICD-10-CM | POA: Diagnosis not present

## 2020-05-20 DIAGNOSIS — I5030 Unspecified diastolic (congestive) heart failure: Secondary | ICD-10-CM

## 2020-05-20 DIAGNOSIS — I110 Hypertensive heart disease with heart failure: Secondary | ICD-10-CM

## 2020-05-20 DIAGNOSIS — I10 Essential (primary) hypertension: Secondary | ICD-10-CM | POA: Diagnosis not present

## 2020-05-20 MED ORDER — GLUCAGON EMERGENCY 1 MG IJ KIT: 1.0000 | PACK | Freq: Once | INTRAMUSCULAR | 1 refills | Status: DC

## 2020-05-20 NOTE — Progress Notes (Signed)
Established Patient Office Visit  Subjective:  Patient ID: Bridget [name], female    DOB: Feb 10, 1941  Age: 79 y.o. MRN: [id number]  CC:  Chief Complaint  Patient presents with  . Hyperlipidemia  . Diabetes    HPI Pt presents with hyperlipidemia. She is no longer taking Tricor. Compliance with treatment has been good; she maintains her low cholesterol diet, follows up as directed, and maintains her exercise regimen.  She denies experiencing any hypercholesterolemia related symptoms. Compliance with treatment has been good; she takes her medication as directed, maintains her diet and exercise regimen. Last labs on 11/20/19 (TC 142, TRIG 113, HDL 56, LDL 66).   Type 2 diabetes mellitus with diabetic nephropathy details; Quintana has type 2, non-insulin requiring diabetes, complicated bymeds include an oral hypoglycemic ( Glucophage ).  She reports home blood glucose readings have averaged fasting readings in the 90-130s s mg/dL range. She has experienced some hypoglycemic episodes after forgetting to eat.During one episode she became diaphoretic and unable to stand after napping. Symptoms were alleviated with orange juice. She is unable to recall her blood glucose levels during hypoglycemic episodes. She checks her glucose 1 times per day.  In regard to preventative care, she performs foot self-exams daily.  Patient sees endocrinology, Dr. Dwyane Dee.  Last A1c 5.9 on 03/23/20.  With regard to the major depressive disorder, recurrent episode, moderate, this is a routine follow-up.  Current medications include Effexor XR 75 mg once daily in am. She has experienced some anhedonia related to losing friends to COVID-19, social distancing, and recent heart failure diagnosis. She denies need for medication adjustment or counseling at this time.   Heart failure with LV dysfunction NYHA class III and reduced EF (25-30%) per ECHO on 05/13/20. She is followed by Dr Otho Perl cardiologist. Current treatment includes  Coreg 37.5 mg BID. She has a history of pacemaker insertion in 2018 for complete heart block. Subsequently developed an axillary DVT. She is taking Xarelto 28m.     Hypertension was first diagnosed several years ago.  Her current cardiac medication regimen includes a beta-blocker ( carvedilol ) and a calcium channel blocker ( amlodipine ). Compliance with treatment has been good; she takes her medication as directed and maintains her diet and exercise regimen.    Past Medical History:  Diagnosis Date  . Asthma   . Atrioventricular block   . Diabetes mellitus without complication ([place]   . Fibromyalgia   . Heart disease   . Hypertension   . Insomnia   . Kidney disease   . Kidney stones 01/12/1983  . Major depression   . Major depressive disorder, recurrent, mild ([facility]   . Mixed hyperlipidemia   . Osteoporosis 01/27/2019  . Other amnesia   . 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 Statin myopathy   . Thyroid disease     Past Surgical History:  Procedure Laterality Date  . ABDOMINAL HYSTERECTOMY  03/05/1998   pt does not think this was a total hysterectomy  . APPENDECTOMY  12/31/1988  . BREAST LUMPECTOMY Left 05/22/1996  . pacemaker  09/27/2016    Family History  Problem Relation Age of Onset  . High blood pressure Mother   . Diabetes Mother        no medication needed  . Kidney disease Mother   . Pneumonia Mother   . High blood pressure Father   . Cancer Maternal Grandmother        Leukemia  . Cancer Other  Ovarian    Social History   Socioeconomic History  . Marital status: Married    Spouse name: Not on file  . Number of children: 2  . Years of education: Not on file  . Highest education level: 9th grade  Occupational History  . Not on file  Tobacco Use  . Smoking status: Never Smoker  . Smokeless tobacco: Never Used  Vaping Use  . Vaping Use: Never used  Substance and Sexual Activity  . Alcohol use: Never  . Drug use: Never  .  Sexual activity: Not on file  Other Topics Concern  . Not on file  Social History Narrative   Lives at home with her husband   Right handed   Caffeine: mostly tea, 0-2 cups a day   Social Determinants of Health   Financial Resource Strain:   . Difficulty of Paying Living Expenses: Not on file  Food Insecurity:   . Worried About Charity fundraiser in the Last Year: Not on file  . Ran Out of Food in the Last Year: Not on file  Transportation Needs:   . Lack of Transportation (Medical): Not on file  . Lack of Transportation (Non-Medical): Not on file  Physical Activity:   . Days of Exercise per Week: Not on file  . Minutes of Exercise per Session: Not on file  Stress:   . Feeling of Stress : Not on file  Social Connections:   . Frequency of Communication with Friends and Family: Not on file  . Frequency of Social Gatherings with Friends and Family: Not on file  . Attends Religious Services: Not on file  . Active Member of Clubs or Organizations: Not on file  . Attends Archivist Meetings: Not on file  . Marital Status: Not on file  Intimate Partner Violence:   . Fear of Current or Ex-Partner: Not on file  . Emotionally Abused: Not on file  . Physically Abused: Not on file  . Sexually Abused: Not on file    Outpatient Medications Prior to Visit  Medication Sig Dispense Refill  . albuterol (VENTOLIN HFA) 108 (90 Base) MCG/ACT inhaler Inhale into the lungs every 6 (six) hours as needed for wheezing or shortness of breath.    Marland Kitchen amLODipine (NORVASC) 5 MG tablet Take 1 tablet by mouth once daily    . aspirin 81 MG chewable tablet Chew 81 mg by mouth in the morning.     . Budeson-Glycopyrrol-Formoterol (BREZTRI AEROSPHERE) 160-9-4.8 MCG/ACT AERO Inhale 2 puffs into the lungs 2 (two) times daily. 10.7 g 5  . carvedilol (COREG) 25 MG tablet Take 25 mg by mouth 2 (two) times daily with a meal. Taking 37.5 mg bid    . Cholecalciferol (VITAMIN D-3) 1000 UNITS CAPS Take 1,000  Units by mouth daily.    . fenofibrate 160 MG tablet Take 1 tablet by mouth once daily (Patient not taking: Reported on 04/28/2020) 90 tablet 0  . furosemide (LASIX) 20 MG tablet Take 1 tablet (20 mg total) by mouth daily. 30 tablet 0  . glucose blood (ONETOUCH ULTRA) test strip USE 1 STRIP TO CHECK GLUCOSE ONCE DAILY 90 each 0  . Lancets (ONETOUCH DELICA PLUS [id number]) MISC 1 each by Other route 2 (two) times daily. DX:E11.9; WILL PROVIDE 30 DAY SUPPLY. MUST CALL TO SCHEDULE AN APPT WITH LABS PRIOR TO APPT 100 each 3  . Magnesium 250 MG TABS Take 250 mg by mouth daily.    . metFORMIN (GLUCOPHAGE) 500  MG tablet TAKE 1 TABLET BY MOUTH TWICE DAILY WITH MORNING AND EVENING MEALS 180 tablet 1  . montelukast (SINGULAIR) 10 MG tablet Take 1 tablet (10 mg total) by mouth at bedtime. (Patient not taking: Reported on 03/23/2020) 30 tablet 11  . Multiple Vitamin (MULTI VITAMIN DAILY PO) Take by mouth daily.    . Multiple Vitamins-Minerals (CENTRUM SILVER ULTRA WOMENS PO) Take by mouth daily.    . nitroGLYCERIN (NITROSTAT) 0.4 MG SL tablet Place 0.4 mg under the tongue every 5 (five) minutes as needed for chest pain.    . polyethylene glycol (MIRALAX / GLYCOLAX) packet Take 17 g by mouth daily as needed.    . Rivaroxaban (XARELTO) 15 MG TABS tablet Take 15 mg by mouth daily.    Marland Kitchen venlafaxine XR (EFFEXOR-XR) 75 MG 24 hr capsule Take 1 capsule by mouth once daily 90 capsule 0   No facility-administered medications prior to visit.    Allergies  Allergen Reactions  . Atorvastatin Other (See Comments)    "muscle Cramps"  . Budesonide Other (See Comments)    "fatigue"  . Gabapentin Other (See Comments)    "fatigue"  . Metoclopramide Other (See Comments)    "mayalgia"  . Rosuvastatin Other (See Comments)    "muscle cramps"  . Simvastatin Other (See Comments)    "muscle Cramps"  . Sulfamethoxazole-Trimethoprim Rash    ROS Review of Systems  Constitutional: Positive for appetite change (decrease in  appetite), diaphoresis (During low blood sugar episodes) and fatigue. Negative for chills and fever.  HENT: Positive for congestion, ear pain and rhinorrhea. Negative for sore throat.   Respiratory: Negative for cough and shortness of breath (more with exertion, but also at rest. Denies chest pain. Patient is using proair 1-2 times a day. No allergy medicines. ).   Cardiovascular: Negative for chest pain.  Gastrointestinal: Positive for constipation (Using preparation H to help stool to pass.  Has tried adding fiber, but no help.  Alternates with diarrhea. ). Negative for abdominal pain, diarrhea, nausea and vomiting.  Endocrine: Positive for polydipsia and polyuria.  Genitourinary: Negative for urgency.  Musculoskeletal: Positive for back pain. Negative for myalgias.       Low back all the way across. Usually goes along with constipation.  Skin: Negative.   Neurological: Positive for weakness and headaches. Numbness: bl hands (rt >rt)        Increased forgetfulness   Psychiatric/Behavioral: Positive for decreased concentration. Negative for dysphoric mood. The patient is not nervous/anxious.       Objective:    Physical Exam Constitutional:      Appearance: She is well-developed.  Cardiovascular:     Rate and Rhythm: Normal rate and regular rhythm.     Heart sounds: Normal heart sounds.  Pulmonary:     Effort: Pulmonary effort is normal.     Breath sounds: Normal breath sounds.  Neurological:     Mental Status: She is alert.  Psychiatric:        Behavior: Behavior normal.    Diabetic Foot Exam - Simple   Simple Foot Form Diabetic Foot exam was performed with the following findings: Yes 05/20/2020  7:14 PM  Visual Inspection No deformities, no ulcerations, no other skin breakdown bilaterally: Yes Sensation Testing Intact to touch and monofilament testing bilaterally: Yes Pulse Check Posterior Tibialis and Dorsalis pulse intact bilaterally: Yes Comments      BP 120/62    Pulse 74   Temp (!) 96.7 F (35.9 C)   Resp  16   Ht 5' (1.524 m)   Wt 110 lb 3.2 oz (50 kg)   BMI 21.52 kg/m  Wt Readings from Last 3 Encounters:  05/20/20 110 lb 3.2 oz (50 kg)  03/23/20 110 lb (49.9 kg)  01/13/20 112 lb 9.6 oz (51.1 kg)     Health Maintenance Due  Topic Date Due  . Hepatitis C Screening  Never done  . FOOT EXAM  01/02/2020    There are no preventive care reminders to display for this patient.  Lab Results  Component Value Date   TSH 1.64 03/23/2020   Lab Results  Component Value Date   WBC 6.9 11/20/2019   HGB 11.8 11/20/2019   HCT 36.1 11/20/2019   MCV 89 11/20/2019   PLT 211 11/20/2019   Lab Results  Component Value Date   NA 138 03/23/2020   K 3.8 03/23/2020   CO2 30 03/23/2020   GLUCOSE 105 (H) 03/23/2020   BUN 29 (H) 03/23/2020   CREATININE 1.53 (H) 03/23/2020   BILITOT 0.4 11/20/2019   ALKPHOS 56 11/20/2019   AST 33 11/20/2019   ALT 19 11/20/2019   PROT 5.9 (L) 11/20/2019   ALBUMIN 3.8 11/20/2019   CALCIUM 9.9 03/23/2020   GFR 32.91 (L) 03/23/2020   Lab Results  Component Value Date   CHOL 142 11/20/2019   Lab Results  Component Value Date   HDL 56 11/20/2019   Lab Results  Component Value Date   LDLCALC 66 11/20/2019   Lab Results  Component Value Date   TRIG 113 11/20/2019   Lab Results  Component Value Date   CHOLHDL 2.5 11/20/2019   Lab Results  Component Value Date   HGBA1C 5.9 (A) 03/23/2020      Assessment & Plan:  1. Diabetic glomerulopathy ([place]) Well controlled. The current medical regimen is effective;  continue present plan and medications. Check sugars daily. - Glucagon, rDNA, (GLUCAGON EMERGENCY) 1 MG KIT; Inject 1 kit as directed once for 1 dose. If sugars < 70, if you cannot increase with oral glucose.  Dispense: 1 kit; Refill: 1 Continue Metformin  2. Hypertensive cardiovascular disease with congestive heart failure, systolic Well controlled.  No changes to medicines.  Continue to work on  eating a healthy diet and exercise.  Labs drawn today.  - CBC with Differential/Platelet - Comprehensive metabolic panel - Continue Amlodipine  3. Mixed hyperlipidemia Not at goal.  Recommended zetia. Continue to work on eating a healthy diet and exercise.  Labs drawn today.  - Lipid panel  4. Need for immunization against influenza - Flu Vaccine QUAD High Dose(Fluad)  5. Depression, major, recurrent, mild ([place]) The current medical regimen is effective;  continue present plan and medications. Continue Effexor  6. Congestive heart failure with LV diastolic dysfunction, NYHA class 3 ([facility]) - NT-proBNP Continue Carvedilol   7. Drug induced myopathy Due to statins.  8. Asthma, mild, intermittent.  Recommend continue use of proair and Breztri Aerosphere   9. Allergic rhinitis to pollen Recommended otc flonase.  [name], MD

## 2020-05-20 NOTE — Patient Instructions (Addendum)
Try OTC flonase nasal spray.   Hypoglycemia Hypoglycemia is when the sugar (glucose) level in your blood is too low. Signs of low blood sugar may include:  Feeling: ? Hungry. ? Worried or nervous (anxious). ? Sweaty and clammy. ? Confused. ? Dizzy. ? Sleepy. ? Sick to your stomach (nauseous).  Having: ? A fast heartbeat. ? A headache. ? A change in your vision. ? Tingling or no feeling (numbness) around your mouth, lips, or tongue. ? Jerky movements that you cannot control (seizure).  Having trouble with: ? Moving (coordination). ? Sleeping. ? Passing out (fainting). ? Getting upset easily (irritability). Low blood sugar can happen to people who have diabetes and people who do not have diabetes. Low blood sugar can happen quickly, and it can be an emergency. Treating low blood sugar Low blood sugar is often treated by eating or drinking something sugary right away, such as:  Fruit juice, 4-6 oz (120-150 mL).  Regular soda (not diet soda), 4-6 oz (120-150 mL).  Low-fat milk, 4 oz (120 mL).  Several pieces of hard candy.  Sugar or honey, 1 Tbsp (15 mL). Treating low blood sugar if you have diabetes If you can think clearly and swallow safely, follow the 15:15 rule:  Take 15 grams of a fast-acting carb (carbohydrate). Talk with your doctor about how much you should take.  Always keep a source of fast-acting carb with you, such as: ? Sugar tablets (glucose pills). Take 3-4 pills. ? 6-8 pieces of hard candy. ? 4-6 oz (120-150 mL) of fruit juice. ? 4-6 oz (120-150 mL) of regular (not diet) soda. ? 1 Tbsp (15 mL) honey or sugar.  Check your blood sugar 15 minutes after you take the carb.  If your blood sugar is still at or below 70 mg/dL (3.9 mmol/L), take 15 grams of a carb again.  If your blood sugar does not go above 70 mg/dL (3.9 mmol/L) after 3 tries, get help right away.  After your blood sugar goes back to normal, eat a meal or a snack within 1  hour.  Treating very low blood sugar If your blood sugar is at or below 54 mg/dL (3 mmol/L), you have very low blood sugar (severe hypoglycemia). This may also cause:  Passing out.  Jerky movements you cannot control (seizure).  Losing consciousness (coma). This is an emergency. Do not wait to see if the symptoms will go away. Get medical help right away. Call your local emergency services (911 in the [place] Do not drive yourself to the hospital. If you have very low blood sugar and you cannot eat or drink, you may need a glucagon shot (injection). A family member or friend should learn how to check your blood sugar and how to give you a glucagon shot. Ask your doctor if you need to have a glucagon shot kit at home. Follow these instructions at home: General instructions  Take over-the-counter and prescription medicines only as told by your doctor.  Stay aware of your blood sugar as told by your doctor.  Limit alcohol intake to no more than 1 drink a day for nonpregnant women and 2 drinks a day for men. One drink equals 12 oz of beer (355 mL), 5 oz of wine (148 mL), or 1 oz of hard liquor (44 mL).  Keep all follow-up visits as told by your doctor. This is important. If you have diabetes:   Follow your diabetes care plan as told by your doctor. Make sure you: ?  Know the signs of low blood sugar. ? Take your medicines as told. ? Follow your exercise and meal plan. ? Eat on time. Do not skip meals. ? Check your blood sugar as often as told by your doctor. Always check it before and after exercise. ? Follow your sick day plan when you cannot eat or drink normally. Make this plan ahead of time with your doctor.  Share your diabetes care plan with: ? Your work or school. ? People you live with.  Check your pee (urine) for ketones: ? When you are sick. ? As told by your doctor.  Carry a card or wear jewelry that says you have diabetes. Contact a doctor if:  You have trouble  keeping your blood sugar in your target range.  You have low blood sugar often. Get help right away if:  You still have symptoms after you eat or drink something sugary.  Your blood sugar is at or below 54 mg/dL (3 mmol/L).  You have jerky movements that you cannot control.  You pass out. These symptoms may be an emergency. Do not wait to see if the symptoms will go away. Get medical help right away. Call your local emergency services (911 in the [place] Do not drive yourself to the hospital. Summary  Hypoglycemia happens when the level of sugar (glucose) in your blood is too low.  Low blood sugar can happen to people who have diabetes and people who do not have diabetes. Low blood sugar can happen quickly, and it can be an emergency.  Make sure you know the signs of low blood sugar and know how to treat it.  Always keep a source of sugar (fast-acting carb) with you to treat low blood sugar. This information is not intended to replace advice given to you by your health care provider. Make sure you discuss any questions you have with your health care provider. Document Revised: 11/21/2018 Document Reviewed: 09/03/2015 Elsevier Patient Education  2020 [employer].

## 2020-05-21 DIAGNOSIS — I442 Atrioventricular block, complete: Secondary | ICD-10-CM | POA: Diagnosis not present

## 2020-05-21 DIAGNOSIS — Z86718 Personal history of other venous thrombosis and embolism: Secondary | ICD-10-CM | POA: Diagnosis not present

## 2020-05-21 DIAGNOSIS — Z45018 Encounter for adjustment and management of other part of cardiac pacemaker: Secondary | ICD-10-CM | POA: Diagnosis not present

## 2020-05-21 DIAGNOSIS — Z7901 Long term (current) use of anticoagulants: Secondary | ICD-10-CM | POA: Diagnosis not present

## 2020-05-21 DIAGNOSIS — I519 Heart disease, unspecified: Secondary | ICD-10-CM | POA: Diagnosis not present

## 2020-05-21 DIAGNOSIS — Z9889 Other specified postprocedural states: Secondary | ICD-10-CM | POA: Diagnosis not present

## 2020-05-21 LAB — COMPREHENSIVE METABOLIC PANEL
ALT: 17 IU/L (ref 0–32)
AST: 21 IU/L (ref 0–40)
Albumin/Globulin Ratio: 1.7 (ref 1.2–2.2)
Albumin: 4 g/dL (ref 3.7–4.7)
Alkaline Phosphatase: 90 IU/L (ref 44–121)
BUN/Creatinine Ratio: 27 (ref 12–28)
BUN: 26 mg/dL (ref 8–27)
Bilirubin Total: 0.4 mg/dL (ref 0.0–1.2)
CO2: 26 mmol/L (ref 20–29)
Calcium: 9.7 mg/dL (ref 8.7–10.3)
Chloride: 101 mmol/L (ref 96–106)
Creatinine, Ser: 0.97 mg/dL (ref 0.57–1.00)
GFR calc Af Amer: 65 mL/min/{1.73_m2} (ref >59)
GFR calc non Af Amer: 57 mL/min/{1.73_m2} — ABNORMAL LOW (ref >59)
Globulin, Total: 2.3 g/dL (ref 1.5–4.5)
Glucose: 107 mg/dL — ABNORMAL HIGH (ref 65–99)
Potassium: 3.8 mmol/L (ref 3.5–5.2)
Sodium: 141 mmol/L (ref 134–144)
Total Protein: 6.3 g/dL (ref 6.0–8.5)

## 2020-05-21 LAB — CBC WITH DIFFERENTIAL/PLATELET
Basophils Absolute: 0.1 10*3/uL (ref 0.0–0.2)
Basos: 1 %
EOS (ABSOLUTE): 0.4 10*3/uL (ref 0.0–0.4)
Eos: 4 %
Hematocrit: 37.2 % (ref 34.0–46.6)
Hemoglobin: 11.8 g/dL (ref 11.1–15.9)
Immature Grans (Abs): 0.1 10*3/uL (ref 0.0–0.1)
Immature Granulocytes: 1 %
Lymphocytes Absolute: 1.3 10*3/uL (ref 0.7–3.1)
Lymphs: 15 %
MCH: 29.8 pg (ref 26.6–33.0)
MCHC: 31.7 g/dL (ref 31.5–35.7)
MCV: 94 fL (ref 79–97)
Monocytes Absolute: 0.6 10*3/uL (ref 0.1–0.9)
Monocytes: 7 %
Neutrophils Absolute: 6.1 10*3/uL (ref 1.4–7.0)
Neutrophils: 72 %
Platelets: 183 10*3/uL (ref 150–450)
RBC: 3.96 x10E6/uL (ref 3.77–5.28)
RDW: 12.4 % (ref 11.7–15.4)
WBC: 8.4 10*3/uL (ref 3.4–10.8)

## 2020-05-21 LAB — LIPID PANEL
Chol/HDL Ratio: 3.2 ratio (ref 0.0–4.4)
Cholesterol, Total: 224 mg/dL — ABNORMAL HIGH (ref 100–199)
HDL: 69 mg/dL (ref >39)
LDL Chol Calc (NIH): 130 mg/dL — ABNORMAL HIGH (ref 0–99)
Triglycerides: 141 mg/dL (ref 0–149)
VLDL Cholesterol Cal: 25 mg/dL (ref 5–40)

## 2020-05-21 LAB — CARDIOVASCULAR RISK ASSESSMENT

## 2020-05-24 ENCOUNTER — Other Ambulatory Visit: Payer: Self-pay

## 2020-05-24 MED ORDER — RIVAROXABAN 15 MG PO TABS: 15.0000 mg | ORAL_TABLET | Freq: Every day | ORAL | 3 refills | Status: DC

## 2020-05-28 DIAGNOSIS — Z950 Presence of cardiac pacemaker: Secondary | ICD-10-CM | POA: Diagnosis not present

## 2020-05-29 DIAGNOSIS — I5020 Unspecified systolic (congestive) heart failure: Secondary | ICD-10-CM | POA: Insufficient documentation

## 2020-06-11 ENCOUNTER — Telehealth: Payer: Self-pay

## 2020-06-11 NOTE — Chronic Care Management (AMB) (Signed)
Chronic Care Management Pharmacy Assistant    Name: Bridget [name]  MRN: [id number] DOB: 1940-12-18    Reason for Encounter: Medication Review-Medication Adherence and Total Gaps - All Measures - Star Measures.     PCP : [name], MD  Allergies:   Allergies  Allergen Reactions  . Atorvastatin Other (See Comments)    "muscle Cramps"  . Budesonide Other (See Comments)    "fatigue"  . Gabapentin Other (See Comments)    "fatigue"  . Metoclopramide Other (See Comments)    "mayalgia"  . Rosuvastatin Other (See Comments)    "muscle cramps"  . Simvastatin Other (See Comments)    "muscle Cramps"  . Sulfamethoxazole-Trimethoprim Rash    Medications: Outpatient Encounter Medications as of 06/11/2020  Medication Sig  . albuterol (VENTOLIN HFA) 108 (90 Base) MCG/ACT inhaler Inhale into the lungs every 6 (six) hours as needed for wheezing or shortness of breath.  Marland Kitchen amLODipine (NORVASC) 5 MG tablet Take 1 tablet by mouth once daily  . aspirin 81 MG chewable tablet Chew 81 mg by mouth in the morning.   . Budeson-Glycopyrrol-Formoterol (BREZTRI AEROSPHERE) 160-9-4.8 MCG/ACT AERO Inhale 2 puffs into the lungs 2 (two) times daily.  . carvedilol (COREG) 25 MG tablet Take 25 mg by mouth 2 (two) times daily with a meal. Taking 37.5 mg bid  . Cholecalciferol (VITAMIN D-3) 1000 UNITS CAPS Take 1,000 Units by mouth daily.  . furosemide (LASIX) 20 MG tablet Take 1 tablet (20 mg total) by mouth daily.  . Glucagon, rDNA, (GLUCAGON EMERGENCY) 1 MG KIT Inject 1 kit as directed once for 1 dose. If sugars < 70, if you cannot increase with oral glucose.  Marland Kitchen glucose blood (ONETOUCH ULTRA) test strip USE 1 STRIP TO CHECK GLUCOSE ONCE DAILY  . Lancets (ONETOUCH DELICA PLUS [id number]) MISC 1 each by Other route 2 (two) times daily. DX:E11.9; WILL PROVIDE 30 DAY SUPPLY. MUST CALL TO SCHEDULE AN APPT WITH LABS PRIOR TO APPT  . Magnesium 250 MG TABS Take 250 mg by mouth daily.  . metFORMIN  (GLUCOPHAGE) 500 MG tablet TAKE 1 TABLET BY MOUTH TWICE DAILY WITH MORNING AND EVENING MEALS  . Multiple Vitamin (MULTI VITAMIN DAILY PO) Take by mouth daily.  . Multiple Vitamins-Minerals (CENTRUM SILVER ULTRA WOMENS PO) Take by mouth daily.  . nitroGLYCERIN (NITROSTAT) 0.4 MG SL tablet Place 0.4 mg under the tongue every 5 (five) minutes as needed for chest pain.  . polyethylene glycol (MIRALAX / GLYCOLAX) packet Take 17 g by mouth daily as needed.  . Rivaroxaban (XARELTO) 15 MG TABS tablet Take 1 tablet (15 mg total) by mouth daily.  Marland Kitchen venlafaxine XR (EFFEXOR-XR) 75 MG 24 hr capsule Take 1 capsule by mouth once daily   No facility-administered encounter medications on file as of 06/11/2020.    Current Diagnosis: Patient Active Problem List   Diagnosis Date Noted  . Heart failure with reduced ejection fraction, NYHA class III ([place]) 05/29/2020  . History of cardiac cath 03/19/2020  . LV dysfunction 02/04/2020  . Dyspnea on exertion 01/06/2020  . Hypoxia 01/06/2020  . Diabetic glomerulopathy ([place]) 11/20/2019  . Diarrhea 11/20/2019  . Other constipation 11/20/2019  . Night sweats 11/20/2019  . Adhesive capsulitis of right shoulder 11/20/2019  . At moderate risk for fall 11/20/2019  . TIA (transient ischemic attack) 06/03/2018  . History of DVT (deep vein thrombosis) 10/13/2016  . Essential hypertension 06/06/2013  . Type II or unspecified type diabetes mellitus without mention of complication,  not stated as uncontrolled 06/05/2013  . Mixed hyperlipidemia 06/05/2013  . Thyroid nodule 06/05/2013      Follow-Up:  Pharmacist Review -Reviewed chart and adherence measures. Per insurance data, patient total gaps - All and Star Gaps have been met. 1. Controlling high blood pressure - met < 140/90 2. Preventing care and screening - Tobacco use  Non - tobacco user - met 3. Wellness Bundle - met 4. Annual Wellness Visit - met .  Elly Modena Brown,CPP Notified  Judithann Sheen,  [place] Pharmacist Assistant [phone]

## 2020-06-18 ENCOUNTER — Telehealth: Payer: Self-pay

## 2020-06-18 DIAGNOSIS — Z86718 Personal history of other venous thrombosis and embolism: Secondary | ICD-10-CM | POA: Diagnosis not present

## 2020-06-18 DIAGNOSIS — I420 Dilated cardiomyopathy: Secondary | ICD-10-CM | POA: Insufficient documentation

## 2020-06-18 DIAGNOSIS — Z7901 Long term (current) use of anticoagulants: Secondary | ICD-10-CM | POA: Diagnosis not present

## 2020-06-18 DIAGNOSIS — Z950 Presence of cardiac pacemaker: Secondary | ICD-10-CM | POA: Diagnosis not present

## 2020-06-18 DIAGNOSIS — Z45018 Encounter for adjustment and management of other part of cardiac pacemaker: Secondary | ICD-10-CM | POA: Diagnosis not present

## 2020-06-18 DIAGNOSIS — I471 Supraventricular tachycardia: Secondary | ICD-10-CM | POA: Diagnosis not present

## 2020-06-18 DIAGNOSIS — Z9889 Other specified postprocedural states: Secondary | ICD-10-CM | POA: Diagnosis not present

## 2020-06-18 DIAGNOSIS — I519 Heart disease, unspecified: Secondary | ICD-10-CM | POA: Diagnosis not present

## 2020-06-18 DIAGNOSIS — I442 Atrioventricular block, complete: Secondary | ICD-10-CM | POA: Diagnosis not present

## 2020-06-18 NOTE — Chronic Care Management (AMB) (Signed)
Chronic Care Management Pharmacy Assistant   Name: Bridget [name]  MRN: [id number] DOB: 01-21-1941  Reason for Encounter: Medication Review - Patient Assistance Coordination.    PCP : [name], MD  Allergies:   Allergies  Allergen Reactions  . Atorvastatin Other (See Comments)    "muscle Cramps"  . Budesonide Other (See Comments)    "fatigue"  . Gabapentin Other (See Comments)    "fatigue"  . Metoclopramide Other (See Comments)    "mayalgia"  . Rosuvastatin Other (See Comments)    "muscle cramps"  . Simvastatin Other (See Comments)    "muscle Cramps"  . Sulfamethoxazole-Trimethoprim Rash    Medications: Outpatient Encounter Medications as of 06/18/2020  Medication Sig  . albuterol (VENTOLIN HFA) 108 (90 Base) MCG/ACT inhaler Inhale into the lungs every 6 (six) hours as needed for wheezing or shortness of breath.  Marland Kitchen amLODipine (NORVASC) 5 MG tablet Take 1 tablet by mouth once daily  . aspirin 81 MG chewable tablet Chew 81 mg by mouth in the morning.   . Budeson-Glycopyrrol-Formoterol (BREZTRI AEROSPHERE) 160-9-4.8 MCG/ACT AERO Inhale 2 puffs into the lungs 2 (two) times daily.  . carvedilol (COREG) 25 MG tablet Take 25 mg by mouth 2 (two) times daily with a meal. Taking 37.5 mg bid  . Cholecalciferol (VITAMIN D-3) 1000 UNITS CAPS Take 1,000 Units by mouth daily.  . furosemide (LASIX) 20 MG tablet Take 1 tablet (20 mg total) by mouth daily.  . Glucagon, rDNA, (GLUCAGON EMERGENCY) 1 MG KIT Inject 1 kit as directed once for 1 dose. If sugars < 70, if you cannot increase with oral glucose.  Marland Kitchen glucose blood (ONETOUCH ULTRA) test strip USE 1 STRIP TO CHECK GLUCOSE ONCE DAILY  . Lancets (ONETOUCH DELICA PLUS [id number]) MISC 1 each by Other route 2 (two) times daily. DX:E11.9; WILL PROVIDE 30 DAY SUPPLY. MUST CALL TO SCHEDULE AN APPT WITH LABS PRIOR TO APPT  . Magnesium 250 MG TABS Take 250 mg by mouth daily.  . metFORMIN (GLUCOPHAGE) 500 MG tablet TAKE 1 TABLET BY  MOUTH TWICE DAILY WITH MORNING AND EVENING MEALS  . Multiple Vitamin (MULTI VITAMIN DAILY PO) Take by mouth daily.  . Multiple Vitamins-Minerals (CENTRUM SILVER ULTRA WOMENS PO) Take by mouth daily.  . nitroGLYCERIN (NITROSTAT) 0.4 MG SL tablet Place 0.4 mg under the tongue every 5 (five) minutes as needed for chest pain.  . polyethylene glycol (MIRALAX / GLYCOLAX) packet Take 17 g by mouth daily as needed.  . Rivaroxaban (XARELTO) 15 MG TABS tablet Take 1 tablet (15 mg total) by mouth daily.  Marland Kitchen venlafaxine XR (EFFEXOR-XR) 75 MG 24 hr capsule Take 1 capsule by mouth once daily   No facility-administered encounter medications on file as of 06/18/2020.    Current Diagnosis: Patient Active Problem List   Diagnosis Date Noted  . Heart failure with reduced ejection fraction, NYHA class III ([place]) 05/29/2020  . History of cardiac cath 03/19/2020  . LV dysfunction 02/04/2020  . Dyspnea on exertion 01/06/2020  . Hypoxia 01/06/2020  . Diabetic glomerulopathy ([place]) 11/20/2019  . Diarrhea 11/20/2019  . Other constipation 11/20/2019  . Night sweats 11/20/2019  . Adhesive capsulitis of right shoulder 11/20/2019  . At moderate risk for fall 11/20/2019  . TIA (transient ischemic attack) 06/03/2018  . History of DVT (deep vein thrombosis) 10/13/2016  . Essential hypertension 06/06/2013  . Type II or unspecified type diabetes mellitus without mention of complication, not stated as uncontrolled 06/05/2013  . Mixed hyperlipidemia 06/05/2013  .  Thyroid nodule 06/05/2013     Follow-Up:  Patient Assistance Coordination - [place] about patient application for Xarelto. I spoke with Nila, states that patient was denied, she needs a 1040 SR form faxed to them.  Called patient to let her know the status of her application, she ask me to speak to husband she was not able to understand what they needed. Spoke with husband, he will get form and take to Dr. Alyse Low office to have them fax to  [employer].    Elly Modena Owens Shark, [place]. Notified  Judithann Sheen, [place] Pharmacist Assistant [phone]

## 2020-06-22 ENCOUNTER — Other Ambulatory Visit: Payer: Self-pay | Admitting: Endocrinology

## 2020-06-22 ENCOUNTER — Other Ambulatory Visit: Payer: Self-pay | Admitting: Physician Assistant

## 2020-06-22 DIAGNOSIS — E119 Type 2 diabetes mellitus without complications: Secondary | ICD-10-CM

## 2020-06-24 NOTE — Progress Notes (Signed)
Subjective:  Patient ID: Bridget [name], female    DOB: 09-Jul-1941  Age: 79 y.o. MRN: [id number]  Chief Complaint  Patient presents with  . Hypoglycemia    HPI Diabetes with hypoglycemia - 1 month f/u for hypoglycemia. This has resolved. She still breaks out in sweats, but sugars are not low. Patient states that for the past 2 days her readings have been on the high side 160-165. Still tired, but improved. No more allergy symptoms.   Patient has hyperlipidemia. Not taking zetia. Per patient, Dr. Otho [name], told her to wait until she gets cardiac EP procedure.   Patient has nonischemic cardiomyopathy with systolic CHF with pacemaker. Pt sees Dr. Otho [name]. Denies DOE or Pedal edema.  Pt had developed complete heart block requiring the pacemaker and then developed a postop left axillary DVT. Currently, on xarelto.  Current Outpatient Medications on File Prior to Visit  Medication Sig Dispense Refill  . albuterol (VENTOLIN HFA) 108 (90 Base) MCG/ACT inhaler Inhale into the lungs every 6 (six) hours as needed for wheezing or shortness of breath.    Marland Kitchen amLODipine (NORVASC) 5 MG tablet Take 1 tablet by mouth once daily    . aspirin 81 MG chewable tablet Chew 81 mg by mouth in the morning.     . Budeson-Glycopyrrol-Formoterol (BREZTRI AEROSPHERE) 160-9-4.8 MCG/ACT AERO Inhale 2 puffs into the lungs 2 (two) times daily. 10.7 g 5  . carvedilol (COREG) 25 MG tablet Take 25 mg by mouth 2 (two) times daily with a meal. Taking 37.5 mg bid    . Cholecalciferol (VITAMIN D-3) 1000 UNITS CAPS Take 1,000 Units by mouth daily.    . furosemide (LASIX) 20 MG tablet Take 1 tablet (20 mg total) by mouth daily. 30 tablet 0  . Glucagon, rDNA, (GLUCAGON EMERGENCY) 1 MG KIT Inject 1 kit as directed once for 1 dose. If sugars < 70, if you cannot increase with oral glucose. 1 kit 1  . Lancets (ONETOUCH DELICA PLUS [id number]) MISC 1 each by Other route 2 (two) times daily. DX:E11.9; WILL PROVIDE 30 DAY SUPPLY. MUST CALL TO  SCHEDULE AN APPT WITH LABS PRIOR TO APPT 100 each 3  . Magnesium 250 MG TABS Take 250 mg by mouth daily.    . metFORMIN (GLUCOPHAGE) 500 MG tablet TAKE 1 TABLET BY MOUTH TWICE DAILY WITH MORNING AND EVENING MEALS 180 tablet 1  . Multiple Vitamin (MULTI VITAMIN DAILY PO) Take by mouth daily.    . Multiple Vitamins-Minerals (CENTRUM SILVER ULTRA WOMENS PO) Take by mouth daily.    . nitroGLYCERIN (NITROSTAT) 0.4 MG SL tablet Place 0.4 mg under the tongue every 5 (five) minutes as needed for chest pain.    [name] ULTRA test strip USE 1 STRIP TO CHECK GLUCOSE ONCE DAILY 50 each 3  . polyethylene glycol (MIRALAX / GLYCOLAX) packet Take 17 g by mouth daily as needed.    . Rivaroxaban (XARELTO) 15 MG TABS tablet Take 1 tablet (15 mg total) by mouth daily. 90 tablet 3  . venlafaxine XR (EFFEXOR-XR) 75 MG 24 hr capsule Take 1 capsule by mouth once daily 90 capsule 1   No current facility-administered medications on file prior to visit.   Past Medical History:  Diagnosis Date  . Asthma   . Atrioventricular block   . Diabetes mellitus without complication ([place])   . Fibromyalgia   . Heart disease   . Hypertension   . Insomnia   . Kidney disease   . Kidney stones 01/12/1983  .  Major depression   . Major depressive disorder, recurrent, mild (HCC)   . Mixed hyperlipidemia   . Osteoporosis 01/27/2019  . Other amnesia   . 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 Statin myopathy   . Thyroid disease    Past Surgical History:  Procedure Laterality Date  . ABDOMINAL HYSTERECTOMY  03/05/1998   pt does not think this was a total hysterectomy  . APPENDECTOMY  12/31/1988  . BREAST LUMPECTOMY Left 05/22/1996  . pacemaker  09/27/2016    Family History  Problem Relation Age of Onset  . High blood pressure Mother   . Diabetes Mother        no medication needed  . Kidney disease Mother   . Pneumonia Mother   . High blood pressure Father   . Cancer Maternal Grandmother         Leukemia  . Cancer Other        Ovarian   Social History   Socioeconomic History  . Marital status: Married    Spouse name: Not on file  . Number of children: 2  . Years of education: Not on file  . Highest education level: 9th grade  Occupational History  . Not on file  Tobacco Use  . Smoking status: Never Smoker  . Smokeless tobacco: Never Used  Vaping Use  . Vaping Use: Never used  Substance and Sexual Activity  . Alcohol use: Never  . Drug use: Never  . Sexual activity: Not on file  Other Topics Concern  . Not on file  Social History Narrative   Lives at home with her husband   Right handed   Caffeine: mostly tea, 0-2 cups a day   Social Determinants of Health   Financial Resource Strain:   . Difficulty of Paying Living Expenses: Not on file  Food Insecurity:   . Worried About Running Out of Food in the Last Year: Not on file  . Ran Out of Food in the Last Year: Not on file  Transportation Needs:   . Lack of Transportation (Medical): Not on file  . Lack of Transportation (Non-Medical): Not on file  Physical Activity:   . Days of Exercise per Week: Not on file  . Minutes of Exercise per Session: Not on file  Stress:   . Feeling of Stress : Not on file  Social Connections:   . Frequency of Communication with Friends and Family: Not on file  . Frequency of Social Gatherings with Friends and Family: Not on file  . Attends Religious Services: Not on file  . Active Member of Clubs or Organizations: Not on file  . Attends Club or Organization Meetings: Not on file  . Marital Status: Not on file    Review of Systems  Constitutional: Negative for chills, fatigue and fever.  HENT: Positive for rhinorrhea. Negative for congestion, ear pain and sore throat.   Respiratory: Positive for cough. Negative for shortness of breath.   Cardiovascular: Positive for palpitations. Negative for chest pain.  Gastrointestinal: Positive for constipation and nausea. Negative for  abdominal pain, diarrhea and vomiting.  Endocrine: Positive for polydipsia and polyphagia.  Genitourinary: Negative for dysuria and urgency.  Musculoskeletal: Positive for back pain (mild, intermittent). Negative for arthralgias, joint swelling and myalgias.  Neurological: Positive for weakness and headaches. Negative for dizziness and light-headedness.  Psychiatric/Behavioral: Negative for dysphoric mood. The patient is not nervous/anxious.      Objective:  BP 118/60   Pulse 70     Temp (!) 97.3 F (36.3 C)   Ht 5' (1.524 m)   Wt 111 lb (50.3 kg)   SpO2 97%   BMI 21.68 kg/m   BP/Weight 06/25/2020 05/20/2020 03/23/2020  Systolic BP 118 120 100  Diastolic BP 60 62 60  Wt. (Lbs) 111 110.2 110  BMI 21.68 21.52 21.48    Physical Exam Vitals reviewed.  Constitutional:      General: She is not in acute distress.    Appearance: Normal appearance. She is normal weight.  Neck:     Vascular: No carotid bruit.  Cardiovascular:     Rate and Rhythm: Normal rate. Rhythm irregular.     Pulses: Normal pulses.     Heart sounds: Normal heart sounds.  Pulmonary:     Effort: Pulmonary effort is normal. No respiratory distress.     Breath sounds: Normal breath sounds.  Abdominal:     General: Abdomen is flat. Bowel sounds are normal.     Palpations: Abdomen is soft.     Tenderness: There is no abdominal tenderness.  Neurological:     Mental Status: She is alert and oriented to person, place, and time.  Psychiatric:        Mood and Affect: Mood normal.        Behavior: Behavior normal.     Lab Results  Component Value Date   WBC 8.4 05/20/2020   HGB 11.8 05/20/2020   HCT 37.2 05/20/2020   PLT 183 05/20/2020   GLUCOSE 107 (H) 05/20/2020   CHOL 224 (H) 05/20/2020   TRIG 141 05/20/2020   HDL 69 05/20/2020   LDLDIRECT 94.2 12/05/2013   LDLCALC 130 (H) 05/20/2020   ALT 17 05/20/2020   AST 21 05/20/2020   NA 141 05/20/2020   K 3.8 05/20/2020   CL 101 05/20/2020   CREATININE 0.97  05/20/2020   BUN 26 05/20/2020   CO2 26 05/20/2020   TSH 1.64 03/23/2020   HGBA1C 5.9 (A) 03/23/2020   MICROALBUR <0.7 03/23/2020      Assessment & Plan:   1. Diabetic glomerulopathy (HCC) The current medical regimen is effective;  continue present plan and medications. Management per specialist.   2. Congestive heart failure with LV diastolic dysfunction, NYHA class 3 (HCC) The current medical regimen is effective;  continue present plan and medications. Management per specialist.   3. Mixed hyperlipidemia  Defer start of cholesterol medicine to Dr. Folk.  4. Drug-induced myopathy Intolerant to statins  5. Pacemaker Continue EP evaluations. Sounds like may need a new lead.  6. Major depressive disorder, recurrent episode, mild (HCC) The current medical regimen is effective;  continue present plan and medications.  My nursing staff have aided in the documentation of this note on the behalf of Kirsten Cox, MD,as directed by  Kirsten Cox, MD and thoroughly reviewed by Kirsten Cox, MD.  I spent 20 minutes dedicated to the care of this patient on the date of this encounter to include face-to-face time with the patient, as well as Obtaining and/or reviewing separately obtained history. Performing a medically appropriate examination and/or evaluation. Documenting clinical information in the electronic or other health record.  Follow-up: Return in about 4 months (around 10/23/2020) for fasting.  An After Visit Summary was printed and given to the patient.  Kirsten Cox [facility] [phone] 

## 2020-06-25 ENCOUNTER — Ambulatory Visit (INDEPENDENT_AMBULATORY_CARE_PROVIDER_SITE_OTHER): Payer: Medicare Other | Admitting: Family Medicine

## 2020-06-25 ENCOUNTER — Other Ambulatory Visit: Payer: Self-pay

## 2020-06-25 ENCOUNTER — Encounter: Payer: Self-pay | Admitting: Family Medicine

## 2020-06-25 VITALS — BP 118/60 | HR 70 | Temp 97.3°F | Ht 60.0 in | Wt 111.0 lb

## 2020-06-25 DIAGNOSIS — F330 Major depressive disorder, recurrent, mild: Secondary | ICD-10-CM | POA: Diagnosis not present

## 2020-06-25 DIAGNOSIS — Z950 Presence of cardiac pacemaker: Secondary | ICD-10-CM | POA: Diagnosis not present

## 2020-06-25 DIAGNOSIS — E782 Mixed hyperlipidemia: Secondary | ICD-10-CM | POA: Diagnosis not present

## 2020-06-25 DIAGNOSIS — I5030 Unspecified diastolic (congestive) heart failure: Secondary | ICD-10-CM | POA: Diagnosis not present

## 2020-06-25 DIAGNOSIS — E1121 Type 2 diabetes mellitus with diabetic nephropathy: Secondary | ICD-10-CM

## 2020-06-25 DIAGNOSIS — G720 Drug-induced myopathy: Secondary | ICD-10-CM | POA: Diagnosis not present

## 2020-06-25 DIAGNOSIS — I509 Heart failure, unspecified: Secondary | ICD-10-CM | POA: Insufficient documentation

## 2020-07-15 DIAGNOSIS — Z950 Presence of cardiac pacemaker: Secondary | ICD-10-CM | POA: Diagnosis not present

## 2020-07-15 DIAGNOSIS — I442 Atrioventricular block, complete: Secondary | ICD-10-CM | POA: Diagnosis not present

## 2020-07-15 DIAGNOSIS — J439 Emphysema, unspecified: Secondary | ICD-10-CM | POA: Diagnosis not present

## 2020-07-15 DIAGNOSIS — J90 Pleural effusion, not elsewhere classified: Secondary | ICD-10-CM | POA: Diagnosis not present

## 2020-07-15 DIAGNOSIS — Z01818 Encounter for other preprocedural examination: Secondary | ICD-10-CM | POA: Diagnosis not present

## 2020-07-15 DIAGNOSIS — I517 Cardiomegaly: Secondary | ICD-10-CM | POA: Diagnosis not present

## 2020-07-16 ENCOUNTER — Other Ambulatory Visit: Payer: Self-pay

## 2020-07-16 MED ORDER — RIVAROXABAN 15 MG PO TABS
15.0000 mg | ORAL_TABLET | Freq: Every day | ORAL | 1 refills | Status: DC
Start: 2020-07-16 — End: 2021-01-13

## 2020-07-22 DIAGNOSIS — Z86718 Personal history of other venous thrombosis and embolism: Secondary | ICD-10-CM | POA: Diagnosis not present

## 2020-07-22 DIAGNOSIS — Z7984 Long term (current) use of oral hypoglycemic drugs: Secondary | ICD-10-CM | POA: Diagnosis not present

## 2020-07-22 DIAGNOSIS — I110 Hypertensive heart disease with heart failure: Secondary | ICD-10-CM | POA: Diagnosis not present

## 2020-07-22 DIAGNOSIS — I5022 Chronic systolic (congestive) heart failure: Secondary | ICD-10-CM | POA: Diagnosis not present

## 2020-07-22 DIAGNOSIS — Z7901 Long term (current) use of anticoagulants: Secondary | ICD-10-CM | POA: Diagnosis not present

## 2020-07-22 DIAGNOSIS — I428 Other cardiomyopathies: Secondary | ICD-10-CM | POA: Diagnosis not present

## 2020-07-22 DIAGNOSIS — I442 Atrioventricular block, complete: Secondary | ICD-10-CM | POA: Diagnosis not present

## 2020-07-22 DIAGNOSIS — Z950 Presence of cardiac pacemaker: Secondary | ICD-10-CM | POA: Diagnosis not present

## 2020-07-22 DIAGNOSIS — E119 Type 2 diabetes mellitus without complications: Secondary | ICD-10-CM | POA: Diagnosis not present

## 2020-07-22 DIAGNOSIS — Z45018 Encounter for adjustment and management of other part of cardiac pacemaker: Secondary | ICD-10-CM | POA: Diagnosis not present

## 2020-07-22 DIAGNOSIS — I495 Sick sinus syndrome: Secondary | ICD-10-CM | POA: Diagnosis not present

## 2020-07-22 DIAGNOSIS — M47814 Spondylosis without myelopathy or radiculopathy, thoracic region: Secondary | ICD-10-CM | POA: Diagnosis not present

## 2020-07-23 DIAGNOSIS — I495 Sick sinus syndrome: Secondary | ICD-10-CM | POA: Diagnosis not present

## 2020-07-23 DIAGNOSIS — I5022 Chronic systolic (congestive) heart failure: Secondary | ICD-10-CM | POA: Diagnosis not present

## 2020-07-23 DIAGNOSIS — Z45018 Encounter for adjustment and management of other part of cardiac pacemaker: Secondary | ICD-10-CM | POA: Diagnosis not present

## 2020-07-23 DIAGNOSIS — I110 Hypertensive heart disease with heart failure: Secondary | ICD-10-CM | POA: Diagnosis not present

## 2020-07-23 DIAGNOSIS — I428 Other cardiomyopathies: Secondary | ICD-10-CM | POA: Diagnosis not present

## 2020-07-23 DIAGNOSIS — I442 Atrioventricular block, complete: Secondary | ICD-10-CM | POA: Diagnosis not present

## 2020-07-26 ENCOUNTER — Ambulatory Visit: Payer: Medicare Other | Admitting: Endocrinology

## 2020-07-29 ENCOUNTER — Ambulatory Visit: Payer: Medicare Other

## 2020-07-29 ENCOUNTER — Other Ambulatory Visit: Payer: Self-pay

## 2020-07-29 DIAGNOSIS — E782 Mixed hyperlipidemia: Secondary | ICD-10-CM

## 2020-07-29 DIAGNOSIS — E1121 Type 2 diabetes mellitus with diabetic nephropathy: Secondary | ICD-10-CM

## 2020-07-29 NOTE — Patient Instructions (Addendum)
Visit Information  Goals Addressed            This Visit's Progress   . Pharmacy Care Plan       CARE PLAN ENTRY (see longitudinal plan of care for additional care plan information)  Current Barriers:  . Chronic Disease Management support, education, and care coordination needs related to Hypertension, Hyperlipidemia, and Diabetes   Hypertension BP Readings from Last 3 Encounters:  06/25/20 118/60  05/20/20 120/62  03/23/20 100/60   . Pharmacist Clinical Goal(s): o Over the next 90 days, patient will work with PharmD and providers to maintain BP goal <130/80 . Current regimen:  o Carvedilol 37.5 mg twice daily o Furosemide 20 mg daily  o Amlodipine 5 mg daily  . Interventions: o Patient reports good blood pressure readings at home. o Keep up the good work with diet and medication adherence.  . Patient self care activities - Over the next 90 days, patient will: o Check BP daily, document, and provide at future appointments o Ensure daily salt intake < 2300 mg/day  Hyperlipidemia Lab Results  Component Value Date/Time   LDLCALC 130 (H) 05/20/2020 09:23 AM   LDLDIRECT 94.2 12/05/2013 05:13 PM   . Pharmacist Clinical Goal(s): o Over the next 90 days, patient will work with PharmD and providers to achieve LDL goal < 100 . Current regimen:  o Diet and Lifestyle . Interventions: o Keep up the good work watching diet and staying active as tolerated.  o Discussed Zetia recommendation from Dr. Sedalia Muta. Patient waiting to speak with Dr. Judithe Modest after pacemaker procedure.  . Patient self care activities - Over the next 90 days, patient will: o Continue to eat heart healthy diet.   Diabetes Lab Results  Component Value Date/Time   HGBA1C 5.9 (A) 03/23/2020 08:35 AM   HGBA1C 6.3 (H) 11/20/2019 09:31 AM   HGBA1C 6.0 (A) 09/24/2019 01:20 PM   HGBA1C 5.6 02/29/2016 10:11 AM   . Pharmacist Clinical Goal(s): o Over the next 90 days, patient will work with PharmD and providers to  maintain A1c goal <7% . Current regimen:  o Metformin 500 mg bid o One touch ultra test strips and lancets  . Interventions: o Keep up the good work with healthy diet and medication adherence! o Coordinated patient getting upcoming endocrinology labs drawn in [place] prior to visit with Dr. Lucianne Muss.  . Patient self care activities - Over the next 90 days, patient will: o Check blood sugar once daily, document, and provide at future appointments o Contact provider with any episodes of hypoglycemia  Medication management . Pharmacist Clinical Goal(s): o Over the next 90 days, patient will work with PharmD and providers to maintain optimal medication adherence . Current pharmacy: [facility] . Interventions o Comprehensive medication review performed. o Continue current medication management strategy o Provided patient with information to renew [place] attestation over the phone for 2022.  Marland Kitchen Patient self care activities - Over the next 90 days, patient will: o Focus on medication adherence by continuing to use pill box.  o Take medications as prescribed o Report any questions or concerns to PharmD and/or provider(s)  Please see past updates related to this goal by clicking on the "Past Updates" button in the selected goal         The patient verbalized understanding of instructions, educational materials, and care plan provided today and declined offer to receive copy of patient instructions, educational materials, and care plan.   Telephone follow up appointment with pharmacy team  member scheduled for: 10/2020  Earvin Hansen, [facility] Ezetimibe Tablets What is this medicine? EZETIMIBE (ez ET i mibe) blocks the absorption of cholesterol from the stomach. It can help lower blood cholesterol for patients who are at risk of getting heart disease or a stroke. It is only for patients whose cholesterol level is not controlled by diet. This medicine may be used for other purposes; ask your health  care provider or pharmacist if you have questions. COMMON BRAND NAME(S): Zetia What should I tell my health care provider before I take this medicine? They need to know if you have any of these conditions:  liver disease  an unusual or allergic reaction to ezetimibe, medicines, foods, dyes, or preservatives  pregnant or trying to get pregnant  breast-feeding How should I use this medicine? Take this medicine by mouth with a glass of water. Follow the directions on the prescription label. This medicine can be taken with or without food. Take your doses at regular intervals. Do not take your medicine more often than directed. Talk to your pediatrician regarding the use of this medicine in children. Special care may be needed. Overdosage: If you think you have taken too much of this medicine contact a poison control center or emergency room at once. NOTE: This medicine is only for you. Do not share this medicine with others. What if I miss a dose? If you miss a dose, take it as soon as you can. If it is almost time for your next dose, take only that dose. Do not take double or extra doses. What may interact with this medicine? Do not take this medicine with any of the following medications:  fenofibrate  gemfibrozil This medicine may also interact with the following medications:  antacids  cyclosporine  herbal medicines like red yeast rice  other medicines to lower cholesterol or triglycerides This list may not describe all possible interactions. Give your health care provider a list of all the medicines, herbs, non-prescription drugs, or dietary supplements you use. Also tell them if you smoke, drink alcohol, or use illegal drugs. Some items may interact with your medicine. What should I watch for while using this medicine? Visit your doctor or health care professional for regular checks on your progress. You will need to have your cholesterol levels checked. If you are also taking  some other cholesterol medicines, you will also need to have tests to make sure your liver is working properly. Tell your doctor or health care professional if you get any unexplained muscle pain, tenderness, or weakness, especially if you also have a fever and tiredness. You need to follow a low-cholesterol, low-fat diet while you are taking this medicine. This will decrease your risk of getting heart and blood vessel disease. Exercising and avoiding alcohol and smoking can also help. Ask your doctor or dietician for advice. What side effects may I notice from receiving this medicine? Side effects that you should report to your doctor or health care professional as soon as possible:  allergic reactions like skin rash, itching or hives, swelling of the face, lips, or tongue  dark yellow or Shahana Capes urine  unusually weak or tired  yellowing of the skin or eyes Side effects that usually do not require medical attention (report to your doctor or health care professional if they continue or are bothersome):  diarrhea  dizziness  headache  stomach upset or pain This list may not describe all possible side effects. Call your doctor for medical advice  about side effects. You may report side effects to FDA at 1-800-FDA-1088. Where should I keep my medicine? Keep out of the reach of children. Store at room temperature between 15 and 30 degrees C (59 and 86 degrees F). Protect from moisture. Keep container tightly closed. Throw away any unused medicine after the expiration date. NOTE: This sheet is a summary. It may not cover all possible information. If you have questions about this medicine, talk to your doctor, pharmacist, or health care provider.  2020 Elsevier/Gold Standard (2012-02-05 15:39:09)

## 2020-07-29 NOTE — Chronic Care Management (AMB) (Signed)
Chronic Care Management Pharmacy  Name: Bridget [name]  MRN: [id number] DOB: 1941-02-13  Chief Complaint/ HPI  Bridget [name],  79 y.o. , female presents for their Follow-Up CCM visit with the clinical pharmacist via telephone due to COVID-19 Pandemic.  PCP : [name], MD  Their chronic conditions include: HTN, TIA, DM, Thyroid, HLD, Constipation.   Office Visits: 06/25/2020 - Zetia postponed per Dr. Otho Perl until after pacemaker procedure.   Consult Visit: 07/22/2020 - Pacemaker procedure. Unsuccessful so repeating after the first of the year.  05/21/2020 - Cardiology - referred to EP.   Medications: Outpatient Encounter Medications as of 07/29/2020  Medication Sig  . albuterol (VENTOLIN HFA) 108 (90 Base) MCG/ACT inhaler Inhale into the lungs every 6 (six) hours as needed for wheezing or shortness of breath.  Marland Kitchen amLODipine (NORVASC) 5 MG tablet Take 1 tablet by mouth once daily  . aspirin 81 MG chewable tablet Chew 81 mg by mouth in the morning.   . Budeson-Glycopyrrol-Formoterol (BREZTRI AEROSPHERE) 160-9-4.8 MCG/ACT AERO Inhale 2 puffs into the lungs 2 (two) times daily.  . carvedilol (COREG) 25 MG tablet Take 25 mg by mouth 2 (two) times daily with a meal. Taking 37.5 mg bid  . Cholecalciferol (VITAMIN D-3) 1000 UNITS CAPS Take 1,000 Units by mouth daily.  . furosemide (LASIX) 20 MG tablet Take 1 tablet (20 mg total) by mouth daily.  . Glucagon, rDNA, (GLUCAGON EMERGENCY) 1 MG KIT Inject 1 kit as directed once for 1 dose. If sugars < 70, if you cannot increase with oral glucose.  . Lancets (ONETOUCH DELICA PLUS [id number]) MISC 1 each by Other route 2 (two) times daily. DX:E11.9; WILL PROVIDE 30 DAY SUPPLY. MUST CALL TO SCHEDULE AN APPT WITH LABS PRIOR TO APPT  . Magnesium 250 MG TABS Take 250 mg by mouth daily.  . metFORMIN (GLUCOPHAGE) 500 MG tablet TAKE 1 TABLET BY MOUTH TWICE DAILY WITH MORNING AND EVENING MEALS  . Multiple Vitamin (MULTI VITAMIN DAILY PO)  Take by mouth daily.  . Multiple Vitamins-Minerals (CENTRUM SILVER ULTRA WOMENS PO) Take by mouth daily.  . nitroGLYCERIN (NITROSTAT) 0.4 MG SL tablet Place 0.4 mg under the tongue every 5 (five) minutes as needed for chest pain.  [name] ULTRA test strip USE 1 STRIP TO CHECK GLUCOSE ONCE DAILY  . polyethylene glycol (MIRALAX / GLYCOLAX) packet Take 17 g by mouth daily as needed.  . Rivaroxaban (XARELTO) 15 MG TABS tablet Take 1 tablet (15 mg total) by mouth daily.  Marland Kitchen venlafaxine XR (EFFEXOR-XR) 75 MG 24 hr capsule Take 1 capsule by mouth once daily   No facility-administered encounter medications on file as of 07/29/2020.   Allergies  Allergen Reactions  . Atorvastatin Other (See Comments)    "muscle Cramps"  . Budesonide Other (See Comments)    "fatigue"  . Gabapentin Other (See Comments)    "fatigue"  . Metoclopramide Other (See Comments)    "mayalgia"  . Rosuvastatin Other (See Comments)    "muscle cramps"  . Simvastatin Other (See Comments)    "muscle Cramps"  . Sulfamethoxazole-Trimethoprim Rash    SDOH Screenings   Alcohol Screen: Not on file  Depression (PHQ2-9): Low Risk   . PHQ-2 Score: 0  Financial Resource Strain: Not on file  Food Insecurity: Not on file  Housing: Low Risk   . Last Housing Risk Score: 0  Physical Activity: Not on file  Social Connections: Not on file  Stress: Not on file  Tobacco Use: Low  Risk   . Smoking Tobacco Use: Never Smoker  . Smokeless Tobacco Use: Never Used  Transportation Needs: Not on file   Allergies  Allergen Reactions  . Atorvastatin Other (See Comments)    "muscle Cramps"  . Budesonide Other (See Comments)    "fatigue"  . Gabapentin Other (See Comments)    "fatigue"  . Metoclopramide Other (See Comments)    "mayalgia"  . Rosuvastatin Other (See Comments)    "muscle cramps"  . Simvastatin Other (See Comments)    "muscle Cramps"  . Sulfamethoxazole-Trimethoprim Rash   SDOH Screenings   Alcohol Screen: Not on  file  Depression (PHQ2-9): Low Risk   . PHQ-2 Score: 0  Financial Resource Strain: Not on file  Food Insecurity: Not on file  Housing: Low Risk   . Last Housing Risk Score: 0  Physical Activity: Not on file  Social Connections: Not on file  Stress: Not on file  Tobacco Use: Low Risk   . Smoking Tobacco Use: Never Smoker  . Smokeless Tobacco Use: Never Used  Transportation Needs: Not on file      Current Diagnosis/Assessment:  Goals Addressed            This Visit's Progress   . Pharmacy Care Plan       CARE PLAN ENTRY (see longitudinal plan of care for additional care plan information)  Current Barriers:  . Chronic Disease Management support, education, and care coordination needs related to Hypertension, Hyperlipidemia, and Diabetes   Hypertension BP Readings from Last 3 Encounters:  01/13/20 (!) 102/58  01/06/20 122/62  11/20/19 122/66   . Pharmacist Clinical Goal(s): o Over the next 90 days, patient will work with PharmD and providers to maintain BP goal <130/80 . Current regimen:  o Carvedilol 37.5 mg twice daily o Furosemide 20 mg daily  o Amlodipine 5 mg daily  . Interventions: o Patient reports good blood pressure readings at home. o Keep up the good work with diet and medication adherence.  . Patient self care activities - Over the next 90 days, patient will: o Check BP daily, document, and provide at future appointments o Ensure daily salt intake < 2300 mg/day  Hyperlipidemia Lab Results  Component Value Date/Time   LDLCALC 66 11/20/2019 09:31 AM   LDLDIRECT 94.2 12/05/2013 05:13 PM   . Pharmacist Clinical Goal(s): o Over the next 90 days, patient will work with PharmD and providers to maintain LDL goal < 70 . Current regimen:  o Diet and Lifestyle . Interventions: o Keep up the good work watching diet and staying active as tolerated.  o Reviewed fenofibrate discontinued by endocrinology.  . Patient self care activities - Over the next 90  days, patient will: o Continue to eat heart healthy diet.   Diabetes Lab Results  Component Value Date/Time   HGBA1C 6.3 (H) 11/20/2019 09:31 AM   HGBA1C 6.0 (A) 09/24/2019 01:20 PM   HGBA1C 6.1 (A) 01/02/2019 09:15 AM   HGBA1C 5.6 02/29/2016 10:11 AM   . Pharmacist Clinical Goal(s): o Over the next 90 days, patient will work with PharmD and providers to maintain A1c goal <7% . Current regimen:  o Metformin 500 mg bid o One touch ultra test strips and lancets  . Interventions: o Keep up the good work with healthy diet and medication adherence! o Coordinated patient getting upcoming endocrinology labs drawn in [place] prior to visit with Dr. Dwyane Dee.  . Patient self care activities - Over the next 90 days, patient will: o  Check blood sugar once daily, document, and provide at future appointments o Contact provider with any episodes of hypoglycemia  Medication management . Pharmacist Clinical Goal(s): o Over the next 90 days, patient will work with PharmD and providers to maintain optimal medication adherence . Current pharmacy: [facility] . Interventions o Comprehensive medication review performed. o Continue current medication management strategy o Discussed cost of Xarelto and Breztri during patient assistance. Patient will let pharmacist know if she wants to proceed with patient assistance.  . Patient self care activities - Over the next 90 days, patient will: o Focus on medication adherence by continuing to use pill box.  o Take medications as prescribed o Report any questions or concerns to PharmD and/or provider(s)  Please see past updates related to this goal by clicking on the "Past Updates" button in the selected goal        Hyperlipidemia   LDL goal < 100  Lipid Panel     Component Value Date/Time   CHOL 224 (H) 05/20/2020 0923   TRIG 141 05/20/2020 0923   HDL 69 05/20/2020 0923   LDLCALC 130 (H) 05/20/2020 0923   LDLDIRECT 94.2 12/05/2013 1713    Hepatic  Function Latest Ref Rng & Units 05/20/2020 11/20/2019 01/02/2019  Total Protein 6.0 - 8.5 g/dL 6.3 5.9(L) 6.2  Albumin 3.7 - 4.7 g/dL 4.0 3.8 3.6  AST 0 - 40 IU/L 21 33 29  ALT 0 - 32 IU/L _0 Alk Phosphatase 44 - 121 IU/L 90 56 45  Total Bilirubin 0.0 - 1.2 mg/dL 0.4 0.4 0.5     The 10-year ASCVD risk score Mikey Bussing DC Jr., et al., 2013) is: 45.5%   Values used to calculate the score:     Age: 82 years     Sex: Female     Is Non-Hispanic African American: No     Diabetic: Yes     Tobacco smoker: No     Systolic Blood Pressure: 726 mmHg     Is BP treated: Yes     HDL Cholesterol: 69 mg/dL     Total Cholesterol: 224 mg/dL   Patient has failed these meds in past: atorvastatin, rosuvastatin, simvastatin Patient is currently controlled on the following medications:  . Diet and Lifestyle  We discussed:  diet and exercise extensively. Patient has been unable to tolerate statin medications. She is waiting to talk to Dr. Otho Perl after pacemaker procedure before beginning Zetia recommended by Dr. Tobie Poet.   Plan  Continue control with diet and exercise  Diabetes   Recent Relevant Labs: Lab Results  Component Value Date/Time   HGBA1C 5.9 (A) 03/23/2020 08:35 AM   HGBA1C 6.3 (H) 11/20/2019 09:31 AM   HGBA1C 6.0 (A) 09/24/2019 01:20 PM   HGBA1C 5.6 02/29/2016 10:11 AM   MICROALBUR <0.7 03/23/2020 10:49 AM   MICROALBUR 1.8 01/02/2019 09:32 AM     Checking BG: Daily  Recent FBG Readings: 130-132  Patient has failed these meds in past: Jardiance 10 mg, pioglitazone 30 mg Patient is currently controlled on the following medications:   metformin 500 mg bid  one touch ultra test strips, lancets  Last diabetic Foot exam: 11/20/2019   Last diabetic Eye exam:  Appointment June 2021  We discussed: diet and exercise extensively. Patient reports that her blood sugar has been well controlled lately and tolerated the pacemaker attempted procedure well.   Plan  Continue current  medications  Hypertension   BP today is:  <130/80  Office blood  pressures are  BP Readings from Last 3 Encounters:  06/25/20 118/60  05/20/20 120/62  03/23/20 100/60    Patient has failed these meds in the past: amlodipine 5 mg, lisinopril 10 mg, lisinopril-hctz 20-12.5 mg, metoprolol succinate 25 mg , furosemide 20 mg daily Patient is currently controlled on the following medications:   carvedilol 37.5 mg bid  furosemide 20 mg daily   Amlodipine 5 mg daily   Patient checks BP at home daily  Patient home BP readings are ranging: 113/64 mmHg  We discussed diet and exercise extensively. Reports her recent pacemaker procedure didn't work. They needed a different tool they didn't have. She will repeat it after the first of the year. She dreads repeating the procedure but hopes the update will cause her to have more energy. .   Plan  Continue current medications   Hx of DVT   Patient has failed these meds in past: n/a Patient is currently controlled on the following medications:   Xarelto 15 mg daily  We discussed:  Patient received 3 month supply of Xarelto through the patient assistance form. Patient's husband instructed to let pharmacist know when out of pocket requirement (4% of income) has been met for 2022.   Patient reports bruising with Xarelto. We discussed that all medications in this class have this side effect. She plans to mention to cardiologist at next visit to ensure there is not another medication he recommends.   Plan  Continue current medications   COPD / Asthma / Tobacco   Eosinophil count:  No results found for: EOSPCT%                               Eos (Absolute):  Lab Results  Component Value Date/Time   EOSABS 0.4 05/20/2020 09:23 AM    Tobacco Status:  Social History   Tobacco Use  Smoking Status Never Smoker  Smokeless Tobacco Never Used    Patient has failed these meds in past: n/a Patient is currently controlled on the following  medications:   proair every 6 hours prn  Breztri 2 puffs twice daily   Using maintenance inhaler regularly? No Frequency of rescue inhaler use:  several times per month  We discussed: Patient denies concerns or symptoms at this time. Patient has been provided with phone number to complete attestation for [facility] [place] renewal over the phone.    Plan  Continue current medications    Vaccines   Reviewed and discussed patient's vaccination history.    Immunization History  Administered Date(s) Administered  . Fluad Quad(high Dose 65+) 05/20/2020  . Influenza, High Dose Seasonal PF 06/05/2017  . Influenza,trivalent, recombinat, inj, PF 05/14/2012  . Influenza-Unspecified 07/02/2014, 04/13/2015, 04/24/2016, 06/14/2018  . PFIZER SARS-COV-2 Vaccination 08/29/2019, 09/19/2019, 05/14/2020  . Pneumococcal Conjugate-13 04/27/2014  . Pneumococcal Polysaccharide-23 07/14/2010, 07/14/2010, 12/02/2015  . Tdap 11/01/2017    Plan  Recommended patient receive annual flu vaccine in office.    Medication Management   Pt uses [facility] for all medications Uses pill box? Yes Pt endorses good compliance rarely misses medications.   We discussed: She gets her medicine box out each morning.   Plan  Continue current medication management strategy    Follow up: 3 month phone visit

## 2020-08-02 ENCOUNTER — Other Ambulatory Visit: Payer: Self-pay | Admitting: Family Medicine

## 2020-08-08 DIAGNOSIS — I428 Other cardiomyopathies: Secondary | ICD-10-CM | POA: Diagnosis not present

## 2020-08-08 DIAGNOSIS — I509 Heart failure, unspecified: Secondary | ICD-10-CM | POA: Diagnosis not present

## 2020-08-08 DIAGNOSIS — R000 Tachycardia, unspecified: Secondary | ICD-10-CM | POA: Diagnosis not present

## 2020-08-08 DIAGNOSIS — I5023 Acute on chronic systolic (congestive) heart failure: Secondary | ICD-10-CM | POA: Insufficient documentation

## 2020-08-08 DIAGNOSIS — R069 Unspecified abnormalities of breathing: Secondary | ICD-10-CM | POA: Diagnosis not present

## 2020-08-08 DIAGNOSIS — W19XXXA Unspecified fall, initial encounter: Secondary | ICD-10-CM | POA: Diagnosis not present

## 2020-08-08 DIAGNOSIS — J449 Chronic obstructive pulmonary disease, unspecified: Secondary | ICD-10-CM | POA: Diagnosis not present

## 2020-08-08 DIAGNOSIS — R079 Chest pain, unspecified: Secondary | ICD-10-CM | POA: Diagnosis not present

## 2020-08-08 DIAGNOSIS — I2510 Atherosclerotic heart disease of native coronary artery without angina pectoris: Secondary | ICD-10-CM | POA: Diagnosis not present

## 2020-08-08 DIAGNOSIS — J9601 Acute respiratory failure with hypoxia: Secondary | ICD-10-CM | POA: Diagnosis not present

## 2020-08-08 DIAGNOSIS — Z20822 Contact with and (suspected) exposure to covid-19: Secondary | ICD-10-CM | POA: Diagnosis not present

## 2020-08-08 DIAGNOSIS — I10 Essential (primary) hypertension: Secondary | ICD-10-CM | POA: Diagnosis not present

## 2020-08-08 DIAGNOSIS — I110 Hypertensive heart disease with heart failure: Secondary | ICD-10-CM | POA: Diagnosis not present

## 2020-08-08 DIAGNOSIS — R918 Other nonspecific abnormal finding of lung field: Secondary | ICD-10-CM | POA: Diagnosis not present

## 2020-08-09 DIAGNOSIS — I2510 Atherosclerotic heart disease of native coronary artery without angina pectoris: Secondary | ICD-10-CM | POA: Diagnosis present

## 2020-08-09 DIAGNOSIS — R0602 Shortness of breath: Secondary | ICD-10-CM | POA: Diagnosis not present

## 2020-08-09 DIAGNOSIS — E876 Hypokalemia: Secondary | ICD-10-CM | POA: Diagnosis present

## 2020-08-09 DIAGNOSIS — I110 Hypertensive heart disease with heart failure: Secondary | ICD-10-CM | POA: Diagnosis present

## 2020-08-09 DIAGNOSIS — E119 Type 2 diabetes mellitus without complications: Secondary | ICD-10-CM | POA: Diagnosis present

## 2020-08-09 DIAGNOSIS — K219 Gastro-esophageal reflux disease without esophagitis: Secondary | ICD-10-CM | POA: Diagnosis present

## 2020-08-09 DIAGNOSIS — J9601 Acute respiratory failure with hypoxia: Secondary | ICD-10-CM | POA: Diagnosis present

## 2020-08-09 DIAGNOSIS — I5023 Acute on chronic systolic (congestive) heart failure: Secondary | ICD-10-CM | POA: Diagnosis present

## 2020-08-09 DIAGNOSIS — I428 Other cardiomyopathies: Secondary | ICD-10-CM | POA: Diagnosis present

## 2020-08-09 DIAGNOSIS — Z950 Presence of cardiac pacemaker: Secondary | ICD-10-CM | POA: Diagnosis not present

## 2020-08-09 DIAGNOSIS — J449 Chronic obstructive pulmonary disease, unspecified: Secondary | ICD-10-CM | POA: Diagnosis present

## 2020-08-09 DIAGNOSIS — Z86718 Personal history of other venous thrombosis and embolism: Secondary | ICD-10-CM | POA: Diagnosis not present

## 2020-08-20 DIAGNOSIS — I471 Supraventricular tachycardia: Secondary | ICD-10-CM | POA: Diagnosis not present

## 2020-08-20 DIAGNOSIS — Z7901 Long term (current) use of anticoagulants: Secondary | ICD-10-CM | POA: Diagnosis not present

## 2020-08-20 DIAGNOSIS — I442 Atrioventricular block, complete: Secondary | ICD-10-CM | POA: Diagnosis not present

## 2020-08-20 DIAGNOSIS — I5020 Unspecified systolic (congestive) heart failure: Secondary | ICD-10-CM | POA: Diagnosis not present

## 2020-08-20 DIAGNOSIS — I110 Hypertensive heart disease with heart failure: Secondary | ICD-10-CM | POA: Diagnosis not present

## 2020-08-20 DIAGNOSIS — G720 Drug-induced myopathy: Secondary | ICD-10-CM | POA: Diagnosis not present

## 2020-08-20 DIAGNOSIS — E119 Type 2 diabetes mellitus without complications: Secondary | ICD-10-CM | POA: Diagnosis not present

## 2020-08-20 DIAGNOSIS — R0600 Dyspnea, unspecified: Secondary | ICD-10-CM | POA: Diagnosis not present

## 2020-08-20 DIAGNOSIS — I420 Dilated cardiomyopathy: Secondary | ICD-10-CM | POA: Diagnosis not present

## 2020-08-20 DIAGNOSIS — Z794 Long term (current) use of insulin: Secondary | ICD-10-CM | POA: Diagnosis not present

## 2020-08-20 DIAGNOSIS — G459 Transient cerebral ischemic attack, unspecified: Secondary | ICD-10-CM | POA: Diagnosis not present

## 2020-08-20 DIAGNOSIS — I5030 Unspecified diastolic (congestive) heart failure: Secondary | ICD-10-CM | POA: Diagnosis not present

## 2020-08-27 DIAGNOSIS — Z950 Presence of cardiac pacemaker: Secondary | ICD-10-CM | POA: Diagnosis not present

## 2020-09-01 ENCOUNTER — Telehealth: Payer: Self-pay

## 2020-09-01 ENCOUNTER — Other Ambulatory Visit: Payer: Self-pay | Admitting: Endocrinology

## 2020-09-01 DIAGNOSIS — E119 Type 2 diabetes mellitus without complications: Secondary | ICD-10-CM

## 2020-09-01 NOTE — Progress Notes (Addendum)
Chronic Care Management Pharmacy Assistant   Name: Bridget [name]  MRN: [id number] DOB: Aug 23, 1940  Reason for Encounter: General adherence call and pacemaker follow up  Patient Questions:  1.  Have you seen any other providers since your last visit? Yes, 08/23/20 - Cardiology 08/08/20- ED for CHF 07/22/20- Pacemaker upgrade surgery   2.  Any changes in your medicines or health? No    PCP : Bridget Brome, MD  Allergies:   Allergies  Allergen Reactions   Atorvastatin Other (See Comments)    "muscle Cramps"   Budesonide Other (See Comments)    "fatigue"   Gabapentin Other (See Comments)    "fatigue"   Metoclopramide Other (See Comments)    "mayalgia"   Rosuvastatin Other (See Comments)    "muscle cramps"   Simvastatin Other (See Comments)    "muscle Cramps"   Sulfamethoxazole-Trimethoprim Rash    Medications: Outpatient Encounter Medications as of 09/01/2020  Medication Sig   albuterol (VENTOLIN HFA) 108 (90 Base) MCG/ACT inhaler INHALE 1 TO 2 PUFFS BY MOUTH EVERY 4 HOURS AS NEEDED FOR WHEEZING   amLODipine (NORVASC) 5 MG tablet Take 1 tablet by mouth once daily   aspirin 81 MG chewable tablet Chew 81 mg by mouth in the morning.    Budeson-Glycopyrrol-Formoterol (BREZTRI AEROSPHERE) 160-9-4.8 MCG/ACT AERO Inhale 2 puffs into the lungs 2 (two) times daily.   carvedilol (COREG) 25 MG tablet Take 25 mg by mouth 2 (two) times daily with a meal. Taking 37.5 mg bid   Cholecalciferol (VITAMIN D-3) 1000 UNITS CAPS Take 1,000 Units by mouth daily.   furosemide (LASIX) 20 MG tablet Take 1 tablet (20 mg total) by mouth daily.   Glucagon, rDNA, (GLUCAGON EMERGENCY) 1 MG KIT Inject 1 kit as directed once for 1 dose. If sugars < 70, if you cannot increase with oral glucose.   Lancets (ONETOUCH DELICA PLUS [id number]) MISC USE 1  TO CHECK GLUCOSE TWICE DAILY . APPOINTMENT REQUIRED FOR FUTURE REFILLS   Magnesium 250 MG TABS Take 250 mg by mouth daily.   metFORMIN (GLUCOPHAGE) 500  MG tablet TAKE 1 TABLET BY MOUTH TWICE DAILY WITH MORNING AND EVENING MEALS   Multiple Vitamin (MULTI VITAMIN DAILY PO) Take by mouth daily.   Multiple Vitamins-Minerals (CENTRUM SILVER ULTRA WOMENS PO) Take by mouth daily.   nitroGLYCERIN (NITROSTAT) 0.4 MG SL tablet Place 0.4 mg under the tongue every 5 (five) minutes as needed for chest pain.   ONETOUCH ULTRA test strip USE 1 STRIP TO CHECK GLUCOSE ONCE DAILY   polyethylene glycol (MIRALAX / GLYCOLAX) packet Take 17 g by mouth daily as needed.   Rivaroxaban (XARELTO) 15 MG TABS tablet Take 1 tablet (15 mg total) by mouth daily.   venlafaxine XR (EFFEXOR-XR) 75 MG 24 hr capsule Take 1 capsule by mouth once daily   No facility-administered encounter medications on file as of 09/01/2020.    Current Diagnosis: Patient Active Problem List   Diagnosis Date Noted   Drug-induced myopathy 06/25/2020   Congestive heart failure with LV diastolic dysfunction, NYHA class 3 ([place]) 06/25/2020   Pacemaker 06/25/2020   Major depressive disorder, recurrent episode, mild ([place]) 06/25/2020   Nonischemic dilated cardiomyopathy ([place]) 06/18/2020   Heart failure with reduced ejection fraction, NYHA class III ([place]) 05/29/2020   History of cardiac cath 03/19/2020   LV dysfunction 02/04/2020   Dyspnea on exertion 01/06/2020   Hypoxia 01/06/2020   Diabetic glomerulopathy ([place]) 11/20/2019   Diarrhea 11/20/2019   Other constipation 11/20/2019  Night sweats 11/20/2019   Adhesive capsulitis of right shoulder 11/20/2019   At moderate risk for fall 11/20/2019   TIA (transient ischemic attack) 06/03/2018   History of DVT (deep vein thrombosis) 10/13/2016   Essential hypertension 06/06/2013   Type II or unspecified type diabetes mellitus without mention of complication, not stated as uncontrolled 06/05/2013   Mixed hyperlipidemia 06/05/2013   Thyroid nodule 06/05/2013   Spoke with patient she went in to have a new pacemaker and they were not able to get the new  line in, due to her having more heart issues, so she is waiting on them to call her to reschedule surgery to have pacemaker placed.    She stated that when she was in hospital over Christmas they removed 5 pounds of fluid and 2 L of sodium.    Patient stated she is watching her edema and salt intake very close, if she notices any swelling more than normal, she takes and extra fluid pill.  Patient was able to get her Bridget [name] renewed.   Follow-Up:  Pharmacist Review  Bridget [name], CPP   Notified  Bridget [name], [place] Pharmacist Assistant [phone]

## 2020-09-08 ENCOUNTER — Encounter: Payer: Self-pay | Admitting: Nurse Practitioner

## 2020-09-08 ENCOUNTER — Ambulatory Visit (INDEPENDENT_AMBULATORY_CARE_PROVIDER_SITE_OTHER): Payer: Medicare Other | Admitting: Nurse Practitioner

## 2020-09-08 VITALS — BP 138/62 | HR 84 | Temp 97.4°F | Ht 60.0 in | Wt 110.0 lb

## 2020-09-08 DIAGNOSIS — R0601 Orthopnea: Secondary | ICD-10-CM | POA: Diagnosis not present

## 2020-09-08 DIAGNOSIS — H6122 Impacted cerumen, left ear: Secondary | ICD-10-CM | POA: Diagnosis not present

## 2020-09-08 DIAGNOSIS — Z950 Presence of cardiac pacemaker: Secondary | ICD-10-CM

## 2020-09-08 DIAGNOSIS — R0600 Dyspnea, unspecified: Secondary | ICD-10-CM

## 2020-09-08 DIAGNOSIS — R0609 Other forms of dyspnea: Secondary | ICD-10-CM

## 2020-09-08 DIAGNOSIS — I5030 Unspecified diastolic (congestive) heart failure: Secondary | ICD-10-CM

## 2020-09-08 LAB — CBC WITH DIFFERENTIAL/PLATELET
Basophils Absolute: 0.1 10*3/uL (ref 0.0–0.2)
Basos: 1 %
EOS (ABSOLUTE): 0.2 10*3/uL (ref 0.0–0.4)
Eos: 2 %
Hematocrit: 40.8 % (ref 34.0–46.6)
Hemoglobin: 13.5 g/dL (ref 11.1–15.9)
Immature Grans (Abs): 0.1 10*3/uL (ref 0.0–0.1)
Immature Granulocytes: 1 %
Lymphocytes Absolute: 1.3 10*3/uL (ref 0.7–3.1)
Lymphs: 15 %
MCH: 30.6 pg (ref 26.6–33.0)
MCHC: 33.1 g/dL (ref 31.5–35.7)
MCV: 93 fL (ref 79–97)
Monocytes Absolute: 0.7 10*3/uL (ref 0.1–0.9)
Monocytes: 8 %
Neutrophils Absolute: 6.3 10*3/uL (ref 1.4–7.0)
Neutrophils: 73 %
Platelets: 183 10*3/uL (ref 150–450)
RBC: 4.41 x10E6/uL (ref 3.77–5.28)
RDW: 12.8 % (ref 11.7–15.4)
WBC: 8.6 10*3/uL (ref 3.4–10.8)

## 2020-09-08 LAB — COMPREHENSIVE METABOLIC PANEL
ALT: 31 IU/L (ref 0–32)
AST: 35 IU/L (ref 0–40)
Albumin/Globulin Ratio: 1.8 (ref 1.2–2.2)
Albumin: 3.9 g/dL (ref 3.7–4.7)
Alkaline Phosphatase: 108 IU/L (ref 44–121)
BUN/Creatinine Ratio: 25 (ref 12–28)
BUN: 23 mg/dL (ref 8–27)
Bilirubin Total: 0.4 mg/dL (ref 0.0–1.2)
CO2: 22 mmol/L (ref 20–29)
Calcium: 9.9 mg/dL (ref 8.7–10.3)
Chloride: 103 mmol/L (ref 96–106)
Creatinine, Ser: 0.92 mg/dL (ref 0.57–1.00)
GFR calc Af Amer: 69 mL/min/{1.73_m2} (ref >59)
GFR calc non Af Amer: 60 mL/min/{1.73_m2} (ref >59)
Globulin, Total: 2.2 g/dL (ref 1.5–4.5)
Glucose: 109 mg/dL — ABNORMAL HIGH (ref 65–99)
Potassium: 4.5 mmol/L (ref 3.5–5.2)
Sodium: 142 mmol/L (ref 134–144)
Total Protein: 6.1 g/dL (ref 6.0–8.5)

## 2020-09-08 LAB — BRAIN NATRIURETIC PEPTIDE: BNP: 2290.2 pg/mL — ABNORMAL HIGH (ref 0.0–100.0)

## 2020-09-08 NOTE — Progress Notes (Signed)
Acute Office Visit  Subjective:    Patient ID: Bridget [name], female    DOB: [birth year], 80 y.o.   MRN: [id number]  Chief Complaint  Patient presents with  . Shortness of Breath    HPI Patient is in today for increased dyspnea. Onset was two days ago. She states lying down and physical activity aggravates symptoms. Positive orthopnea.Rest and sitting upright in recliner alleviates dyspnea. She does have orthopnea. Pt recently experienced complete heart block with emergency pacemaker placement. She tells me she is scheduled for pacemake lead placement on 09/27/20 with Dr Minna Merritts. She states she has a history of heart failure. She weighs herself daily and is currently treated with Lasix 20 mg daily and Coreg 25 mg BID. She is followed by Dr Otho Perl for cardiology.Other past medical history includes cardiomyopathy, Type 2 DM, asthma, CKD, and depression.  Past Medical History:  Diagnosis Date  . Asthma   . Atrioventricular block   . Diabetes mellitus without complication ([place])   . Fibromyalgia   . Heart disease   . Hypertension   . Insomnia   . Kidney disease   . Kidney stones 01/12/1983  . Major depression   . Major depressive disorder, recurrent, mild ([place])   . Mixed hyperlipidemia   . Osteoporosis 01/27/2019  . Other amnesia   . 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 Statin myopathy   . Thyroid disease     Past Surgical History:  Procedure Laterality Date  . ABDOMINAL HYSTERECTOMY  03/05/1998   pt does not think this was a total hysterectomy  . APPENDECTOMY  12/31/1988  . BREAST LUMPECTOMY Left 05/22/1996  . pacemaker  09/27/2016    Family History  Problem Relation Age of Onset  . High blood pressure Mother   . Diabetes Mother        no medication needed  . Kidney disease Mother   . Pneumonia Mother   . High blood pressure Father   . Cancer Maternal Grandmother        Leukemia  . Cancer Other        Ovarian    Social History    Socioeconomic History  . Marital status: Married    Spouse name: Not on file  . Number of children: 2  . Years of education: Not on file  . Highest education level: 9th grade  Occupational History  . Not on file  Tobacco Use  . Smoking status: Never Smoker  . Smokeless tobacco: Never Used  Vaping Use  . Vaping Use: Never used  Substance and Sexual Activity  . Alcohol use: Never  . Drug use: Never  . Sexual activity: Not on file  Other Topics Concern  . Not on file  Social History Narrative   Lives at home with her husband   Right handed   Caffeine: mostly tea, 0-2 cups a day   Social Determinants of Health   Financial Resource Strain: Not on file  Food Insecurity: Not on file  Transportation Needs: Not on file  Physical Activity: Not on file  Stress: Not on file  Social Connections: Not on file  Intimate Partner Violence: Not on file    Outpatient Medications Prior to Visit  Medication Sig Dispense Refill  . albuterol (ACCUNEB) 0.63 MG/3ML nebulizer solution Take 1 ampule by nebulization every 6 (six) hours as needed for Wheezing.    Marland Kitchen albuterol (VENTOLIN HFA) 108 (90 Base) MCG/ACT inhaler INHALE 1 TO 2 PUFFS BY MOUTH  EVERY 4 HOURS AS NEEDED FOR WHEEZING 9 g 0  . aspirin 81 MG chewable tablet Chew 81 mg by mouth in the morning.     . Budeson-Glycopyrrol-Formoterol (BREZTRI AEROSPHERE) 160-9-4.8 MCG/ACT AERO Inhale 2 puffs into the lungs 2 (two) times daily. 10.7 g 5  . calcium-vitamin D (OSCAL WITH D) 500-200 MG-UNIT TABS tablet Take 1 tablet by mouth daily.    . carvedilol (COREG) 25 MG tablet Take 25 mg by mouth 2 (two) times daily with a meal. Taking 37.5 mg bid    . Cholecalciferol (VITAMIN D-3) 1000 UNITS CAPS Take 1,000 Units by mouth daily.    . furosemide (LASIX) 20 MG tablet Take 1 tablet (20 mg total) by mouth daily. 30 tablet 0  . Lancets (ONETOUCH DELICA PLUS [id number]) MISC USE 1  TO CHECK GLUCOSE TWICE DAILY . APPOINTMENT REQUIRED FOR FUTURE REFILLS 100  each 0  . Magnesium 250 MG TABS Take 250 mg by mouth daily.    . metFORMIN (GLUCOPHAGE) 500 MG tablet TAKE 1 TABLET BY MOUTH TWICE DAILY WITH MORNING AND EVENING MEALS 180 tablet 1  . Multiple Vitamin (MULTI VITAMIN DAILY PO) Take by mouth daily.    . Multiple Vitamins-Minerals (CENTRUM SILVER ULTRA WOMENS PO) Take by mouth daily.    . nitroGLYCERIN (NITROSTAT) 0.4 MG SL tablet Place 0.4 mg under the tongue every 5 (five) minutes as needed for chest pain.    [name] ULTRA test strip USE 1 STRIP TO CHECK GLUCOSE ONCE DAILY 50 each 3  . polyethylene glycol (MIRALAX / GLYCOLAX) packet Take 17 g by mouth daily as needed.    . Rivaroxaban (XARELTO) 15 MG TABS tablet Take 1 tablet (15 mg total) by mouth daily. 90 tablet 1  . venlafaxine XR (EFFEXOR-XR) 75 MG 24 hr capsule Take 1 capsule by mouth once daily 90 capsule 1  . Glucagon, rDNA, (GLUCAGON EMERGENCY) 1 MG KIT Inject 1 kit as directed once for 1 dose. If sugars < 70, if you cannot increase with oral glucose. 1 kit 1  . amLODipine (NORVASC) 5 MG tablet Take 1 tablet by mouth once daily     No facility-administered medications prior to visit.    Allergies  Allergen Reactions  . Atorvastatin Other (See Comments)    "muscle Cramps"  . Budesonide Other (See Comments)    "fatigue"  . Gabapentin Other (See Comments)    "fatigue"  . Metoclopramide Other (See Comments)    "mayalgia"  . Rosuvastatin Other (See Comments)    "muscle cramps"  . Simvastatin Other (See Comments)    "muscle Cramps"  . Sulfamethoxazole-Trimethoprim Rash    Review of Systems  Constitutional: Negative for fatigue and fever.  HENT: Negative for congestion, ear pain, sinus pressure and sore throat.   Eyes: Negative for pain.  Respiratory: Positive for chest tightness and shortness of breath. Negative for cough and wheezing.   Cardiovascular: Negative for chest pain and palpitations.  Gastrointestinal: Negative for abdominal pain, constipation, diarrhea, nausea  and vomiting.  Genitourinary: Negative for dysuria and hematuria.  Musculoskeletal: Negative for arthralgias, back pain, joint swelling and myalgias.  Skin: Negative for rash.  Neurological: Negative for dizziness, weakness and headaches.  Psychiatric/Behavioral: Negative for dysphoric mood. The patient is not nervous/anxious.        Objective:    Physical Exam Constitutional:      Appearance: Normal appearance.  HENT:     Right Ear: Tympanic membrane, ear canal and external ear normal.  Left Ear: Tympanic membrane, ear canal and external ear normal.     Nose: Nose normal.     Mouth/Throat:     Mouth: Mucous membranes are moist.  Cardiovascular:     Rate and Rhythm: Normal rate and regular rhythm.     Pulses: Normal pulses.     Heart sounds: Normal heart sounds.  Pulmonary:     Effort: Pulmonary effort is normal.     Breath sounds: Normal breath sounds.  Abdominal:     Palpations: Abdomen is soft.  Musculoskeletal:        General: Normal range of motion.     Cervical back: Normal range of motion.  Skin:    General: Skin is warm and dry.  Neurological:     Mental Status: She is oriented to person, place, and time.  Psychiatric:        Mood and Affect: Mood normal.        Behavior: Behavior normal.        Thought Content: Thought content normal.        Judgment: Judgment normal.     BP 138/62 (BP Location: Left Arm, Patient Position: Sitting)   Pulse 84   Temp (!) 97.4 F (36.3 C) (Temporal)   Ht 5' (1.524 m)   Wt 110 lb (49.9 kg)   SpO2 93%   BMI 21.48 kg/m  Wt Readings from Last 3 Encounters:  09/08/20 110 lb (49.9 kg)  06/25/20 111 lb (50.3 kg)  05/20/20 110 lb 3.2 oz (50 kg)    Health Maintenance Due  Topic Date Due  . Hepatitis C Screening  Never done    There are no preventive care reminders to display for this patient.   Lab Results  Component Value Date   TSH 1.64 03/23/2020   Lab Results  Component Value Date   WBC 8.4 05/20/2020    HGB 11.8 05/20/2020   HCT 37.2 05/20/2020   MCV 94 05/20/2020   PLT 183 05/20/2020   Lab Results  Component Value Date   NA 141 05/20/2020   K 3.8 05/20/2020   CO2 26 05/20/2020   GLUCOSE 107 (H) 05/20/2020   BUN 26 05/20/2020   CREATININE 0.97 05/20/2020   BILITOT 0.4 05/20/2020   ALKPHOS 90 05/20/2020   AST 21 05/20/2020   ALT 17 05/20/2020   PROT 6.3 05/20/2020   ALBUMIN 4.0 05/20/2020   CALCIUM 9.7 05/20/2020   GFR 32.91 (L) 03/23/2020   Lab Results  Component Value Date   CHOL 224 (H) 05/20/2020   Lab Results  Component Value Date   HDL 69 05/20/2020   Lab Results  Component Value Date   LDLCALC 130 (H) 05/20/2020   Lab Results  Component Value Date   TRIG 141 05/20/2020   Lab Results  Component Value Date   CHOLHDL 3.2 05/20/2020   Lab Results  Component Value Date   HGBA1C 5.9 (A) 03/23/2020         Assessment & Plan:   1. Congestive heart failure with LV diastolic dysfunction, NYHA class 3 (HCC) - Brain natriuretic peptide - CBC with Differential - Comprehensive metabolic panel  2. Pacemaker  3. Dyspnea on exertion - CBC with Differential - Comprehensive metabolic panel  4. Orthopnea  5. Impacted cerumen, left ear -cerumen removal   Take Lasix 20 mg daily as prescribed, may take second dose for increased shortness of breath  For severe shortness of breath seek emergency medical care, call 911  Continue to  weigh yourself daily  Follow-up with cardiology as scheduled   Follow-up: As needed  SignedRanee Gosselin September 08, 2020 at 1454

## 2020-09-08 NOTE — Patient Instructions (Addendum)
Take Lasix 20 mg daily as prescribed, may take second dose for increased shortness of breath  For severe shortness of breath seek emergency medical care, call 911  Continue to weigh yourself daily  Follow-up with cardiology as scheduled   Heart Failure Eating Plan Heart failure, also called congestive heart failure, occurs when your heart does not pump blood well enough to meet your body's needs for oxygen-rich blood. Heart failure is a long-term (chronic) condition. Living with heart failure can be challenging. Following your health care provider's instructions about a healthy lifestyle and working with a dietitian to choose the right foods may help to improve your symptoms. An eating plan for someone with heart failure will include changes that limit the intake of salt (sodium) and unhealthy fat. What are tips for following this plan? Reading food labels  Check food labels for the amount of sodium per serving. Choose foods that have less than 140 mg (milligrams) of sodium in each serving.  Check food labels for the number of calories per serving. This is important if you need to limit your daily calorie intake to lose weight.  Check food labels for the serving size. If you eat more than one serving, you will be eating more sodium and calories than what is listed on the label.  Look for foods that are labeled as "sodium-free," "very low sodium," or "low sodium." ? Foods labeled as "reduced sodium" or "lightly salted" may still have more sodium than what is recommended for you. Cooking  Avoid adding salt when cooking. Ask your health care provider or dietitian before using salt substitutes.  Season food with salt-free seasonings, spices, or herbs. Check the label of seasoning mixes to make sure they do not contain salt.  Cook with heart-healthy oils, such as olive, canola, soybean, or sunflower oil.  Do not fry foods. Cook foods using low-fat methods, such as baking, boiling, grilling,  and broiling.  Limit unhealthy fats when cooking by: ? Removing the skin from poultry, such as chicken. ? Removing all visible fats from meats. ? Skimming the fat off from stews, soups, and gravies before serving them. Meal planning  Limit your intake of: ? Processed, canned, or prepackaged foods. ? Foods that are high in trans fat, such as fried foods. ? Sweets, desserts, sugary drinks, and other foods with added sugar. ? Full-fat dairy products, such as whole milk.  Eat a balanced diet. This may include: ? 4-5 servings of fruit each day and 4-5 servings of vegetables each day. At each meal, try to fill one-half of your plate with fruits and vegetables. ? Up to 6-8 servings of whole grains each day. ? Up to 2 servings of lean meat, poultry, or fish each day. One serving of meat is equal to 3 oz (85 g). This is about the same size as a deck of cards. ? 2 servings of low-fat dairy each day. ? Heart-healthy fats. Healthy fats called omega-3 fatty acids are found in foods such as flaxseed and cold-water fish like sardines, salmon, and mackerel.  Aim to eat 25-35 g (grams) of fiber a day. Foods that are high in fiber include apples, broccoli, carrots, beans, peas, and whole grains.  Do not add salt or condiments that contain salt (such as soy sauce) to foods before eating.  When eating at a restaurant, ask that your food be prepared with less salt or no salt, if possible.  Try to eat 2 or more vegetarian meals each week.  Eat  more home-cooked food and eat less restaurant, buffet, and fast food.   General information  Do not eat more than 2,300 mg of sodium a day. The amount of sodium that is recommended for you may be lower, depending on your condition.  Maintain a healthy body weight as directed. Ask your health care provider what a healthy weight is for you. ? Check your weight every day. ? Work with your health care provider and dietitian to make a plan that is right for you to  lose weight or maintain your current weight.  Limit how much fluid you drink. Ask your health care provider or dietitian how much fluid you can have each day.  Limit or avoid alcohol as told by your health care provider or dietitian. Recommended foods Fruits All fresh, frozen, and canned fruits. Dried fruits, such as raisins, prunes, and cranberries. Vegetables All fresh vegetables. Vegetables that are frozen without sauce or added salt. Low-sodium or sodium-free canned vegetables. Grains Bread with less than 80 mg of sodium per slice. Whole-wheat pasta, quinoa, and brown rice. Oats and oatmeal. Barley. [place]. Grits and cream of wheat. Whole-grain and whole-wheat cold cereal. Meats and other protein foods Lean cuts of meat. Skinless chicken and [place]. Fish with high omega-3 fatty acids, such as salmon, sardines, and other cold-water fishes. Eggs. Dried beans, peas, and edamame. Unsalted nuts and nut butters. Dairy Low-fat or nonfat (skim) milk and dried milk. Rice milk, soy milk, and almond milk. Low-fat or nonfat yogurt. Small amounts of reduced-sodium block cheese. Low-sodium cottage cheese. Fats and oils Olive, canola, soybean, flaxseed, avocado, or sunflower oil. Sweets and desserts Applesauce. Granola bars. Sugar-free pudding and gelatin. Frozen fruit bars. Seasoning and other foods Fresh and dried herbs. Lemon or lime juice. Vinegar. Low-sodium ketchup. Salt-free marinades, salad dressings, sauces, and seasonings. The items listed above may not be a complete list of foods and beverages you can eat. Contact a dietitian for more information. Foods to avoid Fruits Fruits that are dried with sodium-containing preservatives. Vegetables Canned vegetables. Frozen vegetables with sauce or seasonings. Creamed vegetables. [place] fries. Onion rings. Pickled vegetables and sauerkraut. Grains Bread with more than 80 mg of sodium per slice. Hot or cold cereal with more than 140 mg sodium per  serving. Salted pretzels and crackers. Prepackaged breadcrumbs. Bagels, croissants, and biscuits. Meats and other protein foods Ribs and chicken wings. Bacon, ham, pepperoni, bologna, salami, and packaged luncheon meats. Hot dogs, bratwurst, and sausage. Canned meat. Smoked meat and fish. Salted nuts and seeds. Dairy Whole milk, half-and-half, and cream. Buttermilk. Processed cheese, cheese spreads, and cheese curds. Regular cottage cheese. Feta cheese. Shredded cheese. String cheese. Fats and oils Butter, lard, shortening, ghee, and bacon fat. Canned and packaged gravies. Seasoning and other foods Onion salt, garlic salt, table salt, and sea salt. Marinades. Regular salad dressings. Relishes, pickles, and olives. Meat flavorings and tenderizers, and bouillon cubes. Horseradish, ketchup, and mustard. Worcestershire sauce. Teriyaki sauce, soy sauce (including reduced sodium). Hot sauce and Tabasco sauce. Steak sauce, fish sauce, oyster sauce, and cocktail sauce. Taco seasonings. Barbecue sauce. Tartar sauce. The items listed above may not be a complete list of foods and beverages you should avoid. Contact a dietitian for more information. Summary  A heart failure eating plan includes changes that limit your intake of sodium and unhealthy fat, and it may help you lose weight or maintain a healthy weight. Your health care provider may also recommend limiting how much fluid you drink.  Most people with  heart failure should eat no more than 2,300 mg of salt (sodium) a day. The amount of sodium that is recommended for you may be lower, depending on your condition.  Contact your health care provider or dietitian before making any major changes to your diet. This information is not intended to replace advice given to you by your health care provider. Make sure you discuss any questions you have with your health care provider. Document Revised: 03/15/2020 Document Reviewed: 03/15/2020 Elsevier Patient  Education  2021 [place].  Heart Failure Action Plan A heart failure action plan helps you understand what to do when you have symptoms of heart failure. Your action plan is a color-coded plan that lists the symptoms to watch for and indicates what actions to take.  If you have symptoms in the red zone, you need medical care right away.  If you have symptoms in the yellow zone, you are having problems.  If you have symptoms in the green zone, you are doing well. Follow the plan that was created by you and your health care provider. Review your plan each time you visit your health care provider. Red zone These signs and symptoms mean you should get medical help right away:  You have trouble breathing when resting.  You have a dry cough that is getting worse.  You have swelling or pain in your legs or abdomen that is getting worse.  You suddenly gain more than 2-3 lb (0.9-1.4 kg) in 24 hours, or more than 5 lb (2.3 kg) in a week. This amount may be more or less depending on your condition.  You have trouble staying awake or you feel confused.  You have chest pain.  You do not have an appetite.  You pass out.  You have worsening sadness or depression. If you have any of these symptoms, call your local emergency services (911 in the [place] right away. Do not drive yourself to the hospital.   Yellow zone These signs and symptoms mean your condition may be getting worse and you should make some changes:  You have trouble breathing when you are active, or you need to sleep with your head raised on extra pillows to help you breathe.  You have swelling in your legs or abdomen.  You gain 2-3 lb (0.9-1.4 kg) in 24 hours, or 5 lb (2.3 kg) in a week. This amount may be more or less depending on your condition.  You get tired easily.  You have trouble sleeping.  You have a dry cough. If you have any of these symptoms:  Contact your health care provider within the next  day.  Your health care provider may adjust your medicines.   Green zone These signs mean you are doing well and can continue what you are doing:  You do not have shortness of breath.  You have very little swelling or no new swelling.  Your weight is stable (no gain or loss).  You have a normal activity level.  You do not have chest pain or any other new symptoms.   Follow these instructions at home:  Take over-the-counter and prescription medicines only as told by your health care provider.  Weigh yourself daily. Your target weight is __________ lb (__________ kg). ? Call your health care provider if you gain more than __________ lb (__________ kg) in 24 hours, or more than __________ lb (__________ kg) in a week. ? Health care provider name: _____________________________________________________ ? Health care provider phone number: _____________________________________________________  Eat a heart-healthy diet. Work with a diet and nutrition specialist (dietitian) to create an eating plan that is best for you.  Keep all follow-up visits. This is important. Where to find more information  American Heart Association: www.heart.org Summary  A heart failure action plan helps you understand what to do when you have symptoms of heart failure.  Follow the action plan that was created by you and your health care provider.  Get help right away if you have any symptoms in the red zone. This information is not intended to replace advice given to you by your health care provider. Make sure you discuss any questions you have with your health care provider. Document Revised: 03/15/2020 Document Reviewed: 03/15/2020 Elsevier Patient Education  2021 [place].  Natriuretic Peptides Test Why am I having this test? The natriuretic peptides test can be used to help diagnose or manage heart failure and other heart abnormalities. You may have this test:  If you have symptoms that may be  caused by heart failure or a heart attack, such as shortness of breath or fatigue.  To help monitor the treatment and advancement (progression) of heart disease. This test can help your health care provider determine if your symptoms are caused by heart failure or by another condition. If you have had a heart transplant, this test may be done to check for signs that your body's disease-fighting system (immune system) is attacking your newly transplanted heart (rejection). What is being tested? Natriuretic peptides (NPs) are hormones that the heart cells make in response to heart failure. The following two NPs are most frequently measured in this test:  BNP (B-type natriuretic peptide). This is made by the heart's main pumping chamber (left ventricle).  NT-proBNP (N-terminal pro-BNP). This is made by the lining of the blood vessels (endothelial cells). These two hormones are released from heart muscle cells when the heart is stretched and has increased pressure or stress on the ventricles. What kind of sample is taken? A blood sample is required for this test. It is usually collected by inserting a needle into a blood vessel.   Tell a health care provider about:  Any allergies you have.  All medicines you are taking, including vitamins, herbs, eye drops, creams, and over-the-counter medicines.  Any surgeries you have had.  Any medical conditions you have.  Whether you are pregnant or may be pregnant. How are the results reported? Your test results will be reported as values for each type of NP. Your health care provider will compare your results to normal ranges that were established after testing a large group of people (reference ranges). Reference ranges may vary among labs and hospitals. For this test, common normal reference ranges are:  BNP: 0-100 pg/mL or 0-100 ng/L (SI units).  NT-proBNP: 0-450 pg/mL. What do the results mean? Results that are within the reference ranges are  considered normal. These results may mean that:  You do not have heart failure.  You have heart failure or a different heart disease, and your treatment is working effectively. Results that are higher than the reference ranges may mean that:  You have heart failure.  You are having a heart attack.  You have high blood pressure (hypertension).  Your body is rejecting your transplanted heart.  You have an arrhythmia (abnormal heart rhythm). Talk with your health care provider about what your results mean. Questions to ask your health care provider Ask your health care provider, or the department that is doing  the test:  When will my results be ready?  How will I get my results?  What are my treatment options?  What other tests do I need?  What are my next steps? Summary  The natriuretic peptides test can be used to help diagnose or monitor heart failure and other heart abnormalities.  Natriuretic peptides (NPs) are hormones that the heart cells make in response to heart failure. They are released when the heart is stretched and has increased pressure or stress on the ventricles.  You may have this test if you have symptoms that may be caused by heart failure or a heart attack, such as shortness of breath or fatigue. The test can help identify the cause of your symptoms. This information is not intended to replace advice given to you by your health care provider. Make sure you discuss any questions you have with your health care provider. Document Revised: 03/05/2020 Document Reviewed: 03/05/2020 Elsevier Patient Education  2021 [place]. Furosemide Oral Tablets What is this medicine? FUROSEMIDE (fyoor [place] se mide) is a diuretic. It helps you make more urine and to lose salt and excess water from your body. It treats swelling from heart, kidney, or liver disease. It also treats high blood pressure. This medicine may be used for other purposes; ask your health care provider  or pharmacist if you have questions. COMMON BRAND NAME(S): Active-Medicated Specimen Kit, Delone, Diuscreen, Lasix, RX Specimen Collection Kit, Specimen Collection Kit, URINX Medicated Specimen Collection What should I tell my health care provider before I take this medicine? They need to know if you have any of these conditions:  abnormal blood electrolytes  diarrhea or vomiting  gout  heart disease  kidney disease, small amounts of urine, or difficulty passing urine  liver disease  thyroid disease  an unusual or allergic reaction to furosemide, sulfa drugs, other medicines, foods, dyes, or preservatives  pregnant or trying to get pregnant  breast-feeding How should I use this medicine? Take this drug by mouth. Take it as directed on the prescription label at the same time every day. You can take it with or without food. If it upsets your stomach, take it with food. Keep taking it unless your health care provider tells you to stop. Talk to your health care provider about the use of this drug in children. Special care may be needed. Overdosage: If you think you have taken too much of this medicine contact a poison control center or emergency room at once. NOTE: This medicine is only for you. Do not share this medicine with others. What if I miss a dose? If you miss a dose, take it as soon as you can. If it is almost time for your next dose, take only that dose. Do not take double or extra doses. What may interact with this medicine?  aspirin and aspirin-like medicines  certain antibiotics  chloral hydrate  cisplatin  cyclosporine  digoxin  diuretics  laxatives  lithium  medicines for blood pressure  medicines that relax muscles for surgery  methotrexate  NSAIDs, medicines for pain and inflammation like ibuprofen, naproxen, or indomethacin  phenytoin  steroid medicines like prednisone or cortisone  sucralfate  thyroid hormones This list may not  describe all possible interactions. Give your health care provider a list of all the medicines, herbs, non-prescription drugs, or dietary supplements you use. Also tell them if you smoke, drink alcohol, or use illegal drugs. Some items may interact with your medicine. What should I watch  for while using this medicine? Visit your doctor or health care providerfor regular checks on your progress. Check your blood pressure regularly. Ask your doctor or health care providerwhat your blood pressure should be, and when you should contact him or her. If you are a diabetic, check your blood sugar as directed. This medicine may cause serious skin reactions. They can happen weeks to months after starting the medicine. Contact your health care provider right away if you notice fevers or flu-like symptoms with a rash. The rash may be red or purple and then turn into blisters or peeling of the skin. Or, you might notice a red rash with swelling of the face, lips or lymph nodes in your neck or under your arms. You may need to be on a special diet while taking this medicine. Check with your doctor. Also, ask how many glasses of fluid you need to drink a day. You must not get dehydrated. You may get drowsy or dizzy. Do not drive, use machinery, or do anything that needs mental alertness until you know how this drug affects you. Do not stand or sit up quickly, especially if you are an older patient. This reduces the risk of dizzy or fainting spells. Alcohol can make you more drowsy and dizzy. Avoid alcoholic drinks. This medicine can make you more sensitive to the sun. Keep out of the sun. If you cannot avoid being in the sun, wear protective clothing and use sunscreen. Do not use sun lamps or tanning beds/booths. What side effects may I notice from receiving this medicine? Side effects that you should report to your doctor or health care professional as soon as possible:  blood in urine or stools  dry mouth  fever or  chills  hearing loss or ringing in the ears  irregular heartbeat  muscle pain or weakness, cramps  rash, fever, and swollen lymph nodes  redness, blistering, peeling or loosening of the skin, including inside the mouth  skin rash  stomach upset, pain, or nausea  tingling or numbness in the hands or feet  unusually weak or tired  vomiting or diarrhea  yellowing of the eyes or skin Side effects that usually do not require medical attention (report to your doctor or health care professional if they continue or are bothersome):  headache  loss of appetite  unusual bleeding or bruising This list may not describe all possible side effects. Call your doctor for medical advice about side effects. You may report side effects to FDA at 1-800-FDA-1088. Where should I keep my medicine? Keep out of the reach of children and pets. Store at room temperature between 20 and 25 degrees C (68 and 77 degrees F). Protect from light and moisture. Keep the container tightly closed. Throw away any unused drug after the expiration date. NOTE: This sheet is a summary. It may not cover all possible information. If you have questions about this medicine, talk to your doctor, pharmacist, or health care provider.  2021 Elsevier/Gold Standard (2019-03-18 18:01:32)

## 2020-09-09 ENCOUNTER — Telehealth: Payer: Self-pay | Admitting: Nurse Practitioner

## 2020-09-09 NOTE — Telephone Encounter (Signed)
Pt notified by phone of elevated BNP 2,290. She states dyspnea subsided yesterday after taking second dose of Lasix 20 mg. Denies current dyspnea, chest pain, or fatigue. She was instructed to contact cardiologist, Dr Judithe Modest, concerning medication management for heart failure. She is scheduled to have lead placement to pacemaker on 09/27/20.  SH

## 2020-09-13 DIAGNOSIS — I5022 Chronic systolic (congestive) heart failure: Secondary | ICD-10-CM | POA: Diagnosis not present

## 2020-09-13 DIAGNOSIS — I420 Dilated cardiomyopathy: Secondary | ICD-10-CM | POA: Diagnosis not present

## 2020-09-13 DIAGNOSIS — E782 Mixed hyperlipidemia: Secondary | ICD-10-CM | POA: Diagnosis not present

## 2020-09-13 DIAGNOSIS — I110 Hypertensive heart disease with heart failure: Secondary | ICD-10-CM | POA: Diagnosis not present

## 2020-09-13 DIAGNOSIS — Z7901 Long term (current) use of anticoagulants: Secondary | ICD-10-CM | POA: Diagnosis not present

## 2020-09-13 DIAGNOSIS — Z9889 Other specified postprocedural states: Secondary | ICD-10-CM | POA: Diagnosis not present

## 2020-09-13 DIAGNOSIS — Z45018 Encounter for adjustment and management of other part of cardiac pacemaker: Secondary | ICD-10-CM | POA: Diagnosis not present

## 2020-09-13 DIAGNOSIS — I442 Atrioventricular block, complete: Secondary | ICD-10-CM | POA: Diagnosis not present

## 2020-09-13 DIAGNOSIS — Z86718 Personal history of other venous thrombosis and embolism: Secondary | ICD-10-CM | POA: Diagnosis not present

## 2020-09-14 ENCOUNTER — Telehealth: Payer: Self-pay | Admitting: Endocrinology

## 2020-09-14 ENCOUNTER — Other Ambulatory Visit: Payer: Self-pay | Admitting: *Deleted

## 2020-09-14 DIAGNOSIS — E119 Type 2 diabetes mellitus without complications: Secondary | ICD-10-CM

## 2020-09-14 MED ORDER — ONETOUCH DELICA PLUS LANCET33G MISC: 3 refills | Status: DC

## 2020-09-14 NOTE — Telephone Encounter (Signed)
Rx sent 

## 2020-09-14 NOTE — Telephone Encounter (Signed)
Pt called to request a refill for her Lancets. Pt states she will make an appt soon, she just has a surgery coming up and wants to get through that and see how she feels.   PHARMACY: [facility] [address] [place] - [address] Phone:  [phone]  Fax:  [phone]

## 2020-09-22 ENCOUNTER — Other Ambulatory Visit: Payer: Self-pay | Admitting: Endocrinology

## 2020-09-22 DIAGNOSIS — E119 Type 2 diabetes mellitus without complications: Secondary | ICD-10-CM

## 2020-09-23 DIAGNOSIS — I442 Atrioventricular block, complete: Secondary | ICD-10-CM | POA: Diagnosis not present

## 2020-09-23 DIAGNOSIS — Z01812 Encounter for preprocedural laboratory examination: Secondary | ICD-10-CM | POA: Diagnosis not present

## 2020-09-24 ENCOUNTER — Encounter: Payer: Self-pay | Admitting: Endocrinology

## 2020-09-24 ENCOUNTER — Ambulatory Visit (INDEPENDENT_AMBULATORY_CARE_PROVIDER_SITE_OTHER): Payer: Medicare Other | Admitting: Endocrinology

## 2020-09-24 ENCOUNTER — Other Ambulatory Visit: Payer: Self-pay

## 2020-09-24 VITALS — BP 126/76 | HR 78 | Ht 62.0 in | Wt 106.0 lb

## 2020-09-24 DIAGNOSIS — E782 Mixed hyperlipidemia: Secondary | ICD-10-CM | POA: Diagnosis not present

## 2020-09-24 DIAGNOSIS — E119 Type 2 diabetes mellitus without complications: Secondary | ICD-10-CM | POA: Diagnosis not present

## 2020-09-24 DIAGNOSIS — E041 Nontoxic single thyroid nodule: Secondary | ICD-10-CM | POA: Diagnosis not present

## 2020-09-24 LAB — POCT GLYCOSYLATED HEMOGLOBIN (HGB A1C): Hemoglobin A1C: 6 % — AB (ref 4.0–5.6)

## 2020-09-24 MED ORDER — FLUVASTATIN SODIUM 20 MG PO CAPS: 20.0000 mg | ORAL_CAPSULE | Freq: Every day | ORAL | 1 refills | Status: DC

## 2020-09-24 MED ORDER — GLUCOSE BLOOD VI STRP: ORAL_STRIP | 12 refills | Status: DC

## 2020-09-24 MED ORDER — ONETOUCH VERIO FLEX SYSTEM W/DEVICE KIT: PACK | 0 refills | Status: DC

## 2020-09-24 MED ORDER — ONETOUCH DELICA LANCETS 33G MISC: 3 refills | Status: DC

## 2020-09-24 NOTE — Addendum Note (Signed)
Addended by: Tawnya Crook on: 09/24/2020 02:29 PM   Modules accepted: Orders

## 2020-09-24 NOTE — Progress Notes (Signed)
Patient ID: Bridget [name], female   DOB: [birth year], 80 y.o.   MRN: [id number]   Reason for Appointment: follow-up   History of Present Illness   Diagnosis: Type 2 DIABETES MELITUS, date of diagnosis:  2007     Previous history: Her A1c at diagnosis was 7.9% She has been treated with a combination of metformin ER and Actos for several years Usually her A1c has been upper normal and her last A1c was 5.8 in 4/14  Recent history:  Her A1c is about the same at 6% compared to 5.9   She has been using a relatively old One Touch ultra meter  Her fasting blood sugars recently appear to be getting higher than before and as high as 170  She does some readings nonfasting also but they are not consistently high  Lab glucose was 109 last month  Generally has been watching her diet fairly well and no change in her meal planning, she is usually having somewhat decreased appetite  She usually has no side effects with Metformin  Her weight fluctuates based on her fluid status  Not able to do any exercise because of dyspnea on exertion  Generally she is reluctant to take any brand-name medication because of cost     Oral hypoglycemic drugs:   metformin ER 500 mg, 1 tablet 2x a day         Side effects from medications:  Abdominal discomfort from high-dose metformin       Monitors blood glucose:  once a day or less.    Glucometer: One Touch Ultra.           Blood Glucose readings from monitor download:    PRE-MEAL Fasting Lunch Dinner Bedtime Overall  Glucose range:  126-170  125  183, 109  143, 169  100-191  Mean/median:  150     145   POST-MEAL PC Breakfast PC Lunch PC Dinner  Glucose range:   191   Mean/median:      Previous data:  PRE-MEAL Fasting Lunch Dinner Bedtime Overall  Glucose range:  96-149  122, 200     Mean/median:  118/114     124    Meals: 3 meals per day. Usually follows a healthy diet, portions controlled             Dietician visit:  Most recent: 2007            Wt Readings from Last 3 Encounters:  09/24/20 106 lb (48.1 kg)  09/08/20 110 lb (49.9 kg)  06/25/20 111 lb (50.3 kg)   Lab Results  Component Value Date   HGBA1C 6.0 (A) 09/24/2020   HGBA1C 5.9 (A) 03/23/2020   HGBA1C 6.3 (H) 11/20/2019   Lab Results  Component Value Date   MICROALBUR <0.7 03/23/2020   LDLCALC 130 (H) 05/20/2020   CREATININE 0.92 09/08/2020   Other active problems discussed: See review of systems   Allergies as of 09/24/2020      Reactions   Atorvastatin Other (See Comments)   "muscle Cramps"   Budesonide Other (See Comments)   "fatigue"   Gabapentin Other (See Comments)   "fatigue"   Metoclopramide Other (See Comments)   "mayalgia"   Rosuvastatin Other (See Comments)   "muscle cramps"   Simvastatin Other (See Comments)   "muscle Cramps"   Sulfamethoxazole-trimethoprim Rash      Medication List       Accurate as of September 24, 2020 11:59 PM. If you have  any questions, ask your nurse or doctor.        albuterol 0.63 MG/3ML nebulizer solution Commonly known as: ACCUNEB Take 1 ampule by nebulization every 6 (six) hours as needed for Wheezing.   albuterol 108 (90 Base) MCG/ACT inhaler Commonly known as: VENTOLIN HFA INHALE 1 TO 2 PUFFS BY MOUTH EVERY 4 HOURS AS NEEDED FOR WHEEZING   aspirin 81 MG chewable tablet Chew 81 mg by mouth in the morning.   Breztri Aerosphere 160-9-4.8 MCG/ACT Aero Generic drug: Budeson-Glycopyrrol-Formoterol Inhale 2 puffs into the lungs 2 (two) times daily.   calcium-vitamin D 500-200 MG-UNIT Tabs tablet Commonly known as: OSCAL WITH D Take 1 tablet by mouth daily.   carvedilol 25 MG tablet Commonly known as: COREG Take 25 mg by mouth 2 (two) times daily with a meal. Taking 37.5 mg bid   CENTRUM SILVER ULTRA WOMENS PO Take by mouth daily.   fluvastatin 20 MG capsule Commonly known as: LESCOL Take 1 capsule (20 mg total) by mouth at bedtime. Started by: Elayne Snare, MD    furosemide 20 MG tablet Commonly known as: LASIX Take 1 tablet (20 mg total) by mouth daily.   Glucagon Emergency 1 MG Kit Inject 1 kit as directed once for 1 dose. If sugars < 70, if you cannot increase with oral glucose.   losartan 25 MG tablet Commonly known as: COZAAR Take 25 mg by mouth daily.   Magnesium 250 MG Tabs Take 250 mg by mouth daily.   metFORMIN 500 MG tablet Commonly known as: GLUCOPHAGE TAKE 1 TABLET BY MOUTH TWICE DAILY WITH MORNING AND EVENING MEALS   MULTI VITAMIN DAILY PO Take by mouth daily.   nitroGLYCERIN 0.4 MG SL tablet Commonly known as: NITROSTAT Place 0.4 mg under the tongue every 5 (five) minutes as needed for chest pain.   OneTouch Delica Lancets 21F Misc USE 1  TO CHECK GLUCOSE ONCE DAILY *APPOINTMENT  REQUIRED  FOR  FUTURE  REFILLS   OneTouch Ultra test strip Generic drug: glucose blood USE 1 STRIP TO CHECK GLUCOSE ONCE DAILY What changed: Another medication with the same name was added. Make sure you understand how and when to take each. Changed by: Elayne Snare, MD   glucose blood test strip Use as instructed Check sugar once a day What changed: You were already taking a medication with the same name, and this prescription was added. Make sure you understand how and when to take each. Changed by: Elayne Snare, MD   OneTouch Verio Flex System w/Device Kit Check sugar once daily Started by: Elayne Snare, MD   polyethylene glycol 17 g packet Commonly known as: MIRALAX / GLYCOLAX Take 17 g by mouth daily as needed.   Rivaroxaban 15 MG Tabs tablet Commonly known as: XARELTO Take 1 tablet (15 mg total) by mouth daily.   venlafaxine XR 75 MG 24 hr capsule Commonly known as: EFFEXOR-XR Take 1 capsule by mouth once daily   Vitamin D-3 25 MCG (1000 UT) Caps Take 1,000 Units by mouth daily.       Allergies:  Allergies  Allergen Reactions  . Atorvastatin Other (See Comments)    "muscle Cramps"  . Budesonide Other (See Comments)     "fatigue"  . Gabapentin Other (See Comments)    "fatigue"  . Metoclopramide Other (See Comments)    "mayalgia"  . Rosuvastatin Other (See Comments)    "muscle cramps"  . Simvastatin Other (See Comments)    "muscle Cramps"  . Sulfamethoxazole-Trimethoprim Rash  Past Medical History:  Diagnosis Date  . Asthma   . Atrioventricular block   . Diabetes mellitus without complication ([place])   . Fibromyalgia   . Heart disease   . Hypertension   . Insomnia   . Kidney disease   . Kidney stones 01/12/1983  . Major depression   . Major depressive disorder, recurrent, mild ([place])   . Mixed hyperlipidemia   . Osteoporosis 01/27/2019  . Other amnesia   . 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 Statin myopathy   . Thyroid disease     Past Surgical History:  Procedure Laterality Date  . ABDOMINAL HYSTERECTOMY  03/05/1998   pt does not think this was a total hysterectomy  . APPENDECTOMY  12/31/1988  . BREAST LUMPECTOMY Left 05/22/1996  . pacemaker  09/27/2016    Family History  Problem Relation Age of Onset  . High blood pressure Mother   . Diabetes Mother        no medication needed  . Kidney disease Mother   . Pneumonia Mother   . High blood pressure Father   . Cancer Maternal Grandmother        Leukemia  . Cancer Other        Ovarian    Social History:  reports that she has never smoked. She has never used smokeless tobacco. She reports that she does not drink alcohol and does not use drugs.  Review of Systems:  Hypertension:  blood pressure is treated with Coreg, amlodipine and, treated by other physicians  She does check her blood pressure at home Blood pressure has been relatively lower  She has a history of CAD followed by cardiologist  Lipids: She has had hypertriglyceridemia previously with fenofibrate This was stopped because of her triglycerides relatively well controlled and her last triglycerides were still below 150 Currently not on a  statin drug because of statin intolerance with at least atorvastatin, simvastatin and rosuvastatin  She has not been recommended any alternative treatment so far  Lab Results  Component Value Date   CHOL 224 (H) 05/20/2020   CHOL 142 11/20/2019   CHOL 147 05/06/2018   Lab Results  Component Value Date   HDL 69 05/20/2020   HDL 56 11/20/2019   HDL 54 05/06/2018   Lab Results  Component Value Date   LDLCALC 130 (H) 05/20/2020   LDLCALC 66 11/20/2019   LDLCALC 68 05/06/2018   Lab Results  Component Value Date   TRIG 141 05/20/2020   TRIG 113 11/20/2019   TRIG 124 05/06/2018   Lab Results  Component Value Date   CHOLHDL 3.2 05/20/2020   CHOLHDL 2.5 11/20/2019   CHOLHDL 2 09/05/2017   Lab Results  Component Value Date   LDLDIRECT 94.2 12/05/2013     THYROID:  She was previously on thyroid suppression because of her long-standing benign thyroid nodule.  She has had 2 biopsies done on this previously Because of low normal TSH her 50 mcg Synthroid was stopped in 10/14 TSH has been consistently normal subsequently  Her ultrasound last showed mostly cystic nodule in the right side about 1.6 cm  Lab Results  Component Value Date   TSH 1.64 03/23/2020   TSH 1.40 01/02/2019   TSH 1.26 09/05/2017   Last foot exam: 10/21    Examination:   BP 126/76   Pulse 78   Ht 5' 2"  (1.575 m)   Wt 106 lb (48.1 kg)   SpO2 95%   BMI  19.39 kg/m   Body mass index is 19.39 kg/m.   No thyroid enlargement or nodules felt     ASSESSMENT/ PLAN:   Diabetes type 2 on metformin 500 mg twice daily monotherapy  Her blood sugars are well controlled A1c is still normal at 6 compared to 5.9  Although she had previously benefited from weight loss her blood sugars at home are trending higher especially fasting This is without any change in diet or weight gain She may be a potential candidate for an SGLT2 drug but she usually does not want brand-name medications This may need to be  coordinated with her cardiologist  For now we will recommend that she increase her evening Metformin to 1000 mg Also new One Touch monitor given as her ultrasound to monitor is possibly inaccurate being very old  Hypertension: Well-controlled  Renal function: Her last creatinine was relatively last month  LIPIDS: She has CAD and needs cardiovascular risk reduction with a statin or similar drug Last LDL was 130  Discussed importance of risk reduction and she will try Lescol 20 mg, at least every other day for the first 1 to 2 weeks and then daily if no muscle aches Alternatively can try Livalo but this will be more expensive She will have labs done next month on her scheduled appointment with PCP   History of thyroid nodule: History of mostly cystic small stable nodule which is nonpalpable   Thyroid levels have been consistently normal and will recheck annually  Follow-up in 3 months  Patient Instructions  Check blood sugars on waking up 3-4 days a week  Also check blood sugars about 2 hours after meals and do this after different meals by rotation  Recommended blood sugar levels on waking up are 90-130 and about 2 hours after meal is 130-160  Please bring your blood sugar monitor to each visit, thank you      Elayne Snare 09/26/2020, 12:17 PM

## 2020-09-24 NOTE — Patient Instructions (Signed)
Check blood sugars on waking up 3-4 days a week  Also check blood sugars about 2 hours after meals and do this after different meals by rotation  Recommended blood sugar levels on waking up are 90-130 and about 2 hours after meal is 130-160  Please bring your blood sugar monitor to each visit, thank you   

## 2020-09-27 ENCOUNTER — Other Ambulatory Visit: Payer: Self-pay | Admitting: *Deleted

## 2020-09-27 DIAGNOSIS — Z7901 Long term (current) use of anticoagulants: Secondary | ICD-10-CM | POA: Diagnosis not present

## 2020-09-27 DIAGNOSIS — E782 Mixed hyperlipidemia: Secondary | ICD-10-CM | POA: Diagnosis not present

## 2020-09-27 DIAGNOSIS — E119 Type 2 diabetes mellitus without complications: Secondary | ICD-10-CM | POA: Diagnosis not present

## 2020-09-27 DIAGNOSIS — Z45018 Encounter for adjustment and management of other part of cardiac pacemaker: Secondary | ICD-10-CM | POA: Diagnosis not present

## 2020-09-27 DIAGNOSIS — I5022 Chronic systolic (congestive) heart failure: Secondary | ICD-10-CM | POA: Diagnosis not present

## 2020-09-27 DIAGNOSIS — I442 Atrioventricular block, complete: Secondary | ICD-10-CM | POA: Diagnosis not present

## 2020-09-27 DIAGNOSIS — Z86718 Personal history of other venous thrombosis and embolism: Secondary | ICD-10-CM | POA: Diagnosis not present

## 2020-09-27 DIAGNOSIS — I428 Other cardiomyopathies: Secondary | ICD-10-CM | POA: Diagnosis not present

## 2020-09-27 DIAGNOSIS — I420 Dilated cardiomyopathy: Secondary | ICD-10-CM | POA: Diagnosis not present

## 2020-09-27 DIAGNOSIS — I110 Hypertensive heart disease with heart failure: Secondary | ICD-10-CM | POA: Diagnosis not present

## 2020-09-27 DIAGNOSIS — Z7984 Long term (current) use of oral hypoglycemic drugs: Secondary | ICD-10-CM | POA: Diagnosis not present

## 2020-09-27 HISTORY — PX: PACEMAKER REVISION: SHX5999

## 2020-09-27 MED ORDER — ONETOUCH VERIO VI STRP: 1.0000 | ORAL_STRIP | 1 refills | Status: DC | PRN

## 2020-09-28 DIAGNOSIS — I442 Atrioventricular block, complete: Secondary | ICD-10-CM | POA: Diagnosis not present

## 2020-09-28 DIAGNOSIS — I110 Hypertensive heart disease with heart failure: Secondary | ICD-10-CM | POA: Diagnosis not present

## 2020-09-28 DIAGNOSIS — E119 Type 2 diabetes mellitus without complications: Secondary | ICD-10-CM | POA: Diagnosis not present

## 2020-09-28 DIAGNOSIS — I5022 Chronic systolic (congestive) heart failure: Secondary | ICD-10-CM | POA: Diagnosis not present

## 2020-09-28 DIAGNOSIS — E782 Mixed hyperlipidemia: Secondary | ICD-10-CM | POA: Diagnosis not present

## 2020-09-28 DIAGNOSIS — I428 Other cardiomyopathies: Secondary | ICD-10-CM | POA: Diagnosis not present

## 2020-10-08 DIAGNOSIS — Z45018 Encounter for adjustment and management of other part of cardiac pacemaker: Secondary | ICD-10-CM | POA: Diagnosis not present

## 2020-10-08 DIAGNOSIS — I5020 Unspecified systolic (congestive) heart failure: Secondary | ICD-10-CM | POA: Diagnosis not present

## 2020-10-12 ENCOUNTER — Other Ambulatory Visit: Payer: Self-pay | Admitting: Oncology

## 2020-10-12 DIAGNOSIS — D631 Anemia in chronic kidney disease: Secondary | ICD-10-CM

## 2020-10-12 NOTE — Progress Notes (Signed)
[facility]  [address] [place],  [place]  [address] [phone]  Clinic Day:  10/13/2020  Referring physician: Blane Ohara, MD   HISTORY OF PRESENT ILLNESS:  The patient is a 80 y.o. female with with anemia secondary to chronic renal insufficiency.  However, her hemoglobin has been well above 10 to where no red cell shot therapy has been needed in quite some time.  Furthermore, the patient's renal function has improved over these past few years.  She comes in today to reassess her anemia.  Since her last visit, the patient has been doing okay.  As it pertains to her anemia, she denies having increased fatigue or any overt forms of blood loss which concern her for a declining hemoglobin.  PHYSICAL EXAM:  Blood pressure 134/63, pulse 68, temperature 97.7 F (36.5 C), resp. rate 14, height 5\' 2"  (1.575 m), weight 106 lb 3.2 oz (48.2 kg), SpO2 95 %. Wt Readings from Last 3 Encounters:  10/13/20 106 lb 3.2 oz (48.2 kg)  09/24/20 106 lb (48.1 kg)  09/08/20 110 lb (49.9 kg)   Body mass index is 19.42 kg/m. Performance status (ECOG): 1 - Symptomatic but completely ambulatory Physical Exam Constitutional:      Appearance: Normal appearance. She is not ill-appearing.  HENT:     Mouth/Throat:     Mouth: Mucous membranes are moist.     Pharynx: Oropharynx is clear. No oropharyngeal exudate or posterior oropharyngeal erythema.  Cardiovascular:     Rate and Rhythm: Normal rate and regular rhythm.     Heart sounds: No murmur heard. No friction rub. No gallop.   Pulmonary:     Effort: Pulmonary effort is normal. No respiratory distress.     Breath sounds: Normal breath sounds. No wheezing, rhonchi or rales.  Chest:  Breasts:     Right: No axillary adenopathy or supraclavicular adenopathy.     Left: No axillary adenopathy or supraclavicular adenopathy.    Abdominal:     General: Bowel sounds are normal. There is no distension.     Palpations:  Abdomen is soft. There is no mass.     Tenderness: There is no abdominal tenderness.  Musculoskeletal:        General: No swelling.     Right lower leg: No edema.     Left lower leg: No edema.  Lymphadenopathy:     Cervical: No cervical adenopathy.     Upper Body:     Right upper body: No supraclavicular or axillary adenopathy.     Left upper body: No supraclavicular or axillary adenopathy.     Lower Body: No right inguinal adenopathy. No left inguinal adenopathy.  Skin:    General: Skin is warm.     Coloration: Skin is not jaundiced.     Findings: Bruising (left chest wall bruise from pacemaker placement) present. No lesion or rash.  Neurological:     General: No focal deficit present.     Mental Status: She is alert and oriented to person, place, and time. Mental status is at baseline.     Cranial Nerves: Cranial nerves are intact.  Psychiatric:        Mood and Affect: Mood normal.        Behavior: Behavior normal.        Thought Content: Thought content normal.     LABS:   CBC Latest Ref Rng & Units 10/13/2020 09/08/2020 05/20/2020  WBC - 8.1 8.6 8.4  Hemoglobin 12.0 -  16.0 13.0 13.5 11.8  Hematocrit 36 - 46 39 40.8 37.2  Platelets 150 - 399 172 183 183   CMP Latest Ref Rng & Units 10/13/2020 09/08/2020 05/20/2020  Glucose 65 - 99 mg/dL - 299(B) 716(R)  BUN 4 - 21 35(A) 23 26  Creatinine 0.5 - 1.1 1.0 0.92 0.97  Sodium 137 - 147 136(A) 142 141  Potassium 3.4 - 5.3 4.2 4.5 3.8  Chloride 99 - 108 101 103 101  CO2 13 - 22 27(A) 22 26  Calcium 8.7 - 10.7 9.3 9.9 9.7  Total Protein 6.0 - 8.5 g/dL - 6.1 6.3  Total Bilirubin 0.0 - 1.2 mg/dL - 0.4 0.4  Alkaline Phos 25 - 125 101 108 90  AST 13 - 35 36(A) 35 21  ALT 7 - 35 21 31 17      ASSESSMENT & PLAN:  Assessment/Plan:  A 80 y.o. female with anemia secondary to previous renal insufficiency.  Her hemoglobin remains ideal at 13 today.   Furthermore, her kidney parameters are normal.  For now, her anemia will continue to be  followed conservatively.  As she is clinically doing well, I will see this patient back in 6 months for repeat clinical assessment.  If her hemoglobin remains ideal at that time, I will turn her care back over to her primary care after her next visit.  The patient understands all the plans discussed today and is in agreement with them.      Shakil Dirk 64, MD

## 2020-10-13 ENCOUNTER — Telehealth: Payer: Self-pay | Admitting: Oncology

## 2020-10-13 ENCOUNTER — Other Ambulatory Visit: Payer: Self-pay | Admitting: Hematology and Oncology

## 2020-10-13 ENCOUNTER — Other Ambulatory Visit: Payer: Self-pay

## 2020-10-13 ENCOUNTER — Inpatient Hospital Stay: Payer: Medicare Other | Attending: Oncology

## 2020-10-13 ENCOUNTER — Inpatient Hospital Stay (INDEPENDENT_AMBULATORY_CARE_PROVIDER_SITE_OTHER): Payer: Medicare Other | Admitting: Oncology

## 2020-10-13 DIAGNOSIS — D631 Anemia in chronic kidney disease: Secondary | ICD-10-CM

## 2020-10-13 DIAGNOSIS — D649 Anemia, unspecified: Secondary | ICD-10-CM | POA: Diagnosis not present

## 2020-10-13 DIAGNOSIS — N182 Chronic kidney disease, stage 2 (mild): Secondary | ICD-10-CM | POA: Diagnosis not present

## 2020-10-13 DIAGNOSIS — N189 Chronic kidney disease, unspecified: Secondary | ICD-10-CM | POA: Diagnosis not present

## 2020-10-13 LAB — IRON,TIBC AND FERRITIN PANEL
%SAT: 20.6
Ferritin: 49.8
Iron: 82
TIBC: 397

## 2020-10-13 LAB — BASIC METABOLIC PANEL
BUN: 35 — AB (ref 4–21)
CO2: 27 — AB (ref 13–22)
Chloride: 101 (ref 99–108)
Creatinine: 1 (ref 0.5–1.1)
Glucose: 134
Potassium: 4.2 (ref 3.4–5.3)
Sodium: 136 — AB (ref 137–147)

## 2020-10-13 LAB — HEPATIC FUNCTION PANEL
ALT: 21 (ref 7–35)
AST: 36 — AB (ref 13–35)
Alkaline Phosphatase: 101 (ref 25–125)
Bilirubin, Total: 0.7

## 2020-10-13 LAB — CBC AND DIFFERENTIAL
HCT: 39 (ref 36–46)
Hemoglobin: 13 (ref 12.0–16.0)
Neutrophils Absolute: 6.08
Platelets: 172 (ref 150–399)
WBC: 8.1

## 2020-10-13 LAB — CBC: RBC: 4.21 (ref 3.87–5.11)

## 2020-10-13 LAB — COMPREHENSIVE METABOLIC PANEL
Albumin: 3.8 (ref 3.5–5.0)
Calcium: 9.3 (ref 8.7–10.7)

## 2020-10-13 NOTE — Telephone Encounter (Signed)
Per 3/2 los next appt sched and given to patient 

## 2020-10-21 NOTE — Progress Notes (Signed)
Subjective:  Patient ID: Bridget [name], female    DOB: 06-01-1941  Age: 80 y.o. MRN: [id number]  Chief Complaint  Patient presents with  . Diabetes  . Hyperlipidemia    HPI Essential hypertension with systolic CHF  Taking Coreg 25 mg one twice a day, losartan 25 mg once daily and lasix 20 mg once daily alternating with 20 mg one twice a day. Denies dyspnea. Pacemaker upgrade of [place] dual chamber pacemaker to bientricular left bundle branch pacer.  Diabetic glomerulopathy (HCC) Checking sugars daily. 90-120s. Has polydipsia and polyuria. No polyphagia. Controlled with metformin  Major depressive disorder, recurrent episode, mild (HCC) Controlled with effexor xr 75 mg once daily in am. PHQ 9 five.   Mixed hyperlipidemia Takes fluvastatin 20 mg daily, maintains a healthy diet   Anemia of renal insufficiency; good report from Dr. Bobby Rumpf. May release her for up to a year.   Current Outpatient Medications on File Prior to Visit  Medication Sig Dispense Refill  . albuterol (VENTOLIN HFA) 108 (90 Base) MCG/ACT inhaler INHALE 1 TO 2 PUFFS BY MOUTH EVERY 4 HOURS AS NEEDED FOR WHEEZING 9 g 0  . aspirin 81 MG chewable tablet Chew 81 mg by mouth in the morning.     . Blood Glucose Monitoring Suppl (ONETOUCH VERIO FLEX SYSTEM) w/Device KIT Check sugar once daily 1 kit 0  . Budeson-Glycopyrrol-Formoterol (BREZTRI AEROSPHERE) 160-9-4.8 MCG/ACT AERO Inhale 2 puffs into the lungs 2 (two) times daily. 10.7 g 5  . calcium-vitamin D (OSCAL WITH D) 500-200 MG-UNIT TABS tablet Take 1 tablet by mouth daily.    . carvedilol (COREG) 25 MG tablet Take 25 mg by mouth 2 (two) times daily with a meal. Taking 37.5 mg bid    . Cholecalciferol (VITAMIN D-3) 1000 UNITS CAPS Take 1,000 Units by mouth daily.    . furosemide (LASIX) 20 MG tablet Take 1 tablet (20 mg total) by mouth daily. 30 tablet 0  . Glucagon, rDNA, (GLUCAGON EMERGENCY) 1 MG KIT Inject 1 kit as directed once for 1 dose. If sugars  < 70, if you cannot increase with oral glucose. 1 kit 1  . glucose blood (ONETOUCH VERIO) test strip 1 each by Other route as needed for other. Use as instructed to check blood sugar once daily  DX CODE E11.9 100 each 1  . losartan (COZAAR) 25 MG tablet Take 25 mg by mouth daily.    . Magnesium 250 MG TABS Take 250 mg by mouth daily.    . metFORMIN (GLUCOPHAGE) 500 MG tablet TAKE 1 TABLET BY MOUTH TWICE DAILY WITH MORNING AND EVENING MEALS 180 tablet 1  . Multiple Vitamin (MULTI VITAMIN DAILY PO) Take by mouth daily.    . Multiple Vitamins-Minerals (CENTRUM SILVER ULTRA WOMENS PO) Take by mouth daily.    . nitroGLYCERIN (NITROSTAT) 0.4 MG SL tablet Place 0.4 mg under the tongue every 5 (five) minutes as needed for chest pain.    [name] Delica Lancets 53I MISC USE 1  TO CHECK GLUCOSE ONCE DAILY *APPOINTMENT  REQUIRED  FOR  FUTURE  REFILLS 100 each 3  . polyethylene glycol (MIRALAX / GLYCOLAX) packet Take 17 g by mouth daily as needed.    . Rivaroxaban (XARELTO) 15 MG TABS tablet Take 1 tablet (15 mg total) by mouth daily. 90 tablet 1  . venlafaxine XR (EFFEXOR-XR) 75 MG 24 hr capsule Take 1 capsule by mouth once daily 90 capsule 1   No current facility-administered medications on file prior  to visit.   Past Medical History:  Diagnosis Date  . Asthma   . Atrioventricular block   . Diabetes mellitus without complication ([place])   . Fibromyalgia   . Heart disease   . Hypertension   . Insomnia   . Kidney disease   . Kidney stones 01/12/1983  . Major depression   . Major depressive disorder, recurrent, mild ([place])   . Mixed hyperlipidemia   . Osteoporosis 01/27/2019  . Other amnesia   . 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 Statin myopathy   . Thyroid disease    Past Surgical History:  Procedure Laterality Date  . ABDOMINAL HYSTERECTOMY  03/05/1998   pt does not think this was a total hysterectomy  . APPENDECTOMY  12/31/1988  . BREAST LUMPECTOMY Left 05/22/1996   . pacemaker  09/27/2016    Family History  Problem Relation Age of Onset  . High blood pressure Mother   . Diabetes Mother        no medication needed  . Kidney disease Mother   . Pneumonia Mother   . High blood pressure Father   . Cancer Maternal Grandmother        Leukemia  . Cancer Other        Ovarian   Social History   Socioeconomic History  . Marital status: Married    Spouse name: Not on file  . Number of children: 2  . Years of education: Not on file  . Highest education level: 9th grade  Occupational History  . Not on file  Tobacco Use  . Smoking status: Never Smoker  . Smokeless tobacco: Never Used  Vaping Use  . Vaping Use: Never used  Substance and Sexual Activity  . Alcohol use: Never  . Drug use: Never  . Sexual activity: Not on file  Other Topics Concern  . Not on file  Social History Narrative   Lives at home with her husband   Right handed   Caffeine: mostly tea, 0-2 cups a day   Social Determinants of Health   Financial Resource Strain: Not on file  Food Insecurity: Not on file  Transportation Needs: Not on file  Physical Activity: Not on file  Stress: Not on file  Social Connections: Not on file    Review of Systems  Constitutional: Positive for diaphoresis (At night sometimes, but is improving. ). Negative for chills, fatigue and fever.  HENT: Negative for congestion, ear pain, rhinorrhea and sore throat.   Respiratory: Negative for cough and shortness of breath.   Cardiovascular: Negative for chest pain.  Gastrointestinal: Negative for abdominal pain, constipation, diarrhea, nausea and vomiting.  Genitourinary: Negative for dysuria and urgency.  Musculoskeletal: Positive for back pain (once in a while.). Negative for myalgias.  Neurological: Positive for dizziness (occasionally. ). Negative for weakness, light-headedness and headaches.  Psychiatric/Behavioral: Negative for dysphoric mood. The patient is not nervous/anxious.       Objective:  BP 110/70   Pulse 68   Temp (!) 94.7 F (34.8 C)   Ht 5' 2"  (1.575 m)   Wt 107 lb (48.5 kg)   SpO2 96%   BMI 19.57 kg/m   BP/Weight 10/22/2020 10/13/2020 2/56/3893  Systolic BP 734 287 681  Diastolic BP 70 63 76  Wt. (Lbs) 107 106.2 106  BMI 19.57 19.42 19.39    Physical Exam Vitals reviewed.  Constitutional:      Appearance: Normal appearance. She is normal weight.  Neck:  Vascular: No carotid bruit.  Cardiovascular:     Rate and Rhythm: Normal rate and regular rhythm.     Pulses: Normal pulses.     Heart sounds: Normal heart sounds.  Pulmonary:     Effort: Pulmonary effort is normal. No respiratory distress.     Breath sounds: Normal breath sounds.  Abdominal:     General: Abdomen is flat. Bowel sounds are normal.     Palpations: Abdomen is soft.     Tenderness: There is no abdominal tenderness.  Skin:    Findings: Bruising (lots. On right side of neck. on upper extremeties. ) present.     Comments: Area over pacemaker healing. Mild erythema.   Neurological:     Mental Status: She is alert and oriented to person, place, and time.  Psychiatric:        Mood and Affect: Mood normal.        Behavior: Behavior normal.     Diabetic Foot Exam - Simple   Simple Foot Form Diabetic Foot exam was performed with the following findings: Yes 10/21/2020  8:13 AM  Visual Inspection No deformities, no ulcerations, no other skin breakdown bilaterally: Yes Sensation Testing Intact to touch and monofilament testing bilaterally: Yes Pulse Check Posterior Tibialis and Dorsalis pulse intact bilaterally: Yes Comments      Lab Results  Component Value Date   WBC 7.9 10/22/2020   HGB 12.8 10/22/2020   HCT 39.4 10/22/2020   PLT 160 10/22/2020   GLUCOSE 106 (H) 10/22/2020   CHOL 220 (H) 10/22/2020   TRIG 182 (H) 10/22/2020   HDL 78 10/22/2020   LDLDIRECT 94.2 12/05/2013   LDLCALC 111 (H) 10/22/2020   ALT 23 10/22/2020   AST 30 10/22/2020   NA 140  10/22/2020   K 4.2 10/22/2020   CL 103 10/22/2020   CREATININE 0.99 10/22/2020   BUN 26 10/22/2020   CO2 22 10/22/2020   TSH 1.64 03/23/2020   INR 1.4 (H) 10/22/2020   HGBA1C 6.7 (H) 10/22/2020   MICROALBUR <0.7 03/23/2020      Assessment & Plan:   1. Hypertensive systolic chf Compensated. bp well controlled.  No changes to medicines.  Continue to work on eating a healthy diet and exercise.  Mgmt per cardiology Labs drawn today.  - Comprehensive metabolic panel  2. Diabetic glomerulopathy (HCC) Control: great Recommend check sugars fasting daily. Recommend check feet daily. Recommend annual eye exams. Medicines: mgmt per endocrinology.  Continue to work on eating a healthy diet and exercise.  Labs drawn today.   - CBC with Differential/Platelet  3. Major depressive disorder, recurrent episode, mild (HCC) Stable. The current medical regimen is effective;  continue present plan and medications.  4. Mixed hyperlipidemia Well controlled.  Increase lovastatin to 40 mg daily Continue to work on eating a healthy diet and exercise.  Labs drawn today.  - Lipid panel  5. Spontaneous bruising - Protime-INR   6. Memory loss - hx of strokes. Recommend return to Dr. Jaynee Eagles.   Meds ordered this encounter  Medications  . fluvastatin (LESCOL) 20 MG capsule    Sig: Take 1 capsule (20 mg total) by mouth at bedtime.    Dispense:  30 capsule    Refill:  2    Orders Placed This Encounter  Procedures  . Lipid panel  . CBC with Differential/Platelet  . Comprehensive metabolic panel  . Protime-INR  . Hemoglobin A1c  . Cardiovascular Risk Assessment     Follow-up: Return in about  4 months (around 02/21/2021).  An After Visit Summary was printed and given to the patient.  [name], MD [facility] [phone]

## 2020-10-22 ENCOUNTER — Other Ambulatory Visit: Payer: Self-pay

## 2020-10-22 ENCOUNTER — Ambulatory Visit (INDEPENDENT_AMBULATORY_CARE_PROVIDER_SITE_OTHER): Payer: Medicare Other | Admitting: Family Medicine

## 2020-10-22 VITALS — BP 110/70 | HR 68 | Temp 94.7°F | Ht 62.0 in | Wt 107.0 lb

## 2020-10-22 DIAGNOSIS — E1121 Type 2 diabetes mellitus with diabetic nephropathy: Secondary | ICD-10-CM | POA: Diagnosis not present

## 2020-10-22 DIAGNOSIS — Z950 Presence of cardiac pacemaker: Secondary | ICD-10-CM | POA: Diagnosis not present

## 2020-10-22 DIAGNOSIS — E782 Mixed hyperlipidemia: Secondary | ICD-10-CM

## 2020-10-22 DIAGNOSIS — I110 Hypertensive heart disease with heart failure: Secondary | ICD-10-CM | POA: Diagnosis not present

## 2020-10-22 DIAGNOSIS — I10 Essential (primary) hypertension: Secondary | ICD-10-CM | POA: Diagnosis not present

## 2020-10-22 DIAGNOSIS — F330 Major depressive disorder, recurrent, mild: Secondary | ICD-10-CM | POA: Diagnosis not present

## 2020-10-22 DIAGNOSIS — R233 Spontaneous ecchymoses: Secondary | ICD-10-CM | POA: Diagnosis not present

## 2020-10-22 DIAGNOSIS — R413 Other amnesia: Secondary | ICD-10-CM

## 2020-10-22 DIAGNOSIS — I43 Cardiomyopathy in diseases classified elsewhere: Secondary | ICD-10-CM

## 2020-10-22 MED ORDER — FLUVASTATIN SODIUM 20 MG PO CAPS: 20.0000 mg | ORAL_CAPSULE | Freq: Every day | ORAL | 2 refills | Status: DC

## 2020-10-22 NOTE — Patient Instructions (Signed)
Start Lescol. Resent rx to [facility].  Recommend continue to work on eating healthy diet and exercise.

## 2020-10-23 LAB — CBC WITH DIFFERENTIAL/PLATELET
Basophils Absolute: 0.1 10*3/uL (ref 0.0–0.2)
Basos: 1 %
EOS (ABSOLUTE): 0.3 10*3/uL (ref 0.0–0.4)
Eos: 3 %
Hematocrit: 39.4 % (ref 34.0–46.6)
Hemoglobin: 12.8 g/dL (ref 11.1–15.9)
Immature Grans (Abs): 0.1 10*3/uL (ref 0.0–0.1)
Immature Granulocytes: 1 %
Lymphocytes Absolute: 1.4 10*3/uL (ref 0.7–3.1)
Lymphs: 18 %
MCH: 29.8 pg (ref 26.6–33.0)
MCHC: 32.5 g/dL (ref 31.5–35.7)
MCV: 92 fL (ref 79–97)
Monocytes Absolute: 0.6 10*3/uL (ref 0.1–0.9)
Monocytes: 8 %
Neutrophils Absolute: 5.5 10*3/uL (ref 1.4–7.0)
Neutrophils: 69 %
Platelets: 160 10*3/uL (ref 150–450)
RBC: 4.3 x10E6/uL (ref 3.77–5.28)
RDW: 12.5 % (ref 11.7–15.4)
WBC: 7.9 10*3/uL (ref 3.4–10.8)

## 2020-10-23 LAB — LIPID PANEL
Chol/HDL Ratio: 2.8 ratio (ref 0.0–4.4)
Cholesterol, Total: 220 mg/dL — ABNORMAL HIGH (ref 100–199)
HDL: 78 mg/dL (ref >39)
LDL Chol Calc (NIH): 111 mg/dL — ABNORMAL HIGH (ref 0–99)
Triglycerides: 182 mg/dL — ABNORMAL HIGH (ref 0–149)
VLDL Cholesterol Cal: 31 mg/dL (ref 5–40)

## 2020-10-23 LAB — COMPREHENSIVE METABOLIC PANEL
ALT: 23 IU/L (ref 0–32)
AST: 30 IU/L (ref 0–40)
Albumin/Globulin Ratio: 1.6 (ref 1.2–2.2)
Albumin: 3.7 g/dL (ref 3.7–4.7)
Alkaline Phosphatase: 114 IU/L (ref 44–121)
BUN/Creatinine Ratio: 26 (ref 12–28)
BUN: 26 mg/dL (ref 8–27)
Bilirubin Total: 0.4 mg/dL (ref 0.0–1.2)
CO2: 22 mmol/L (ref 20–29)
Calcium: 9.4 mg/dL (ref 8.7–10.3)
Chloride: 103 mmol/L (ref 96–106)
Creatinine, Ser: 0.99 mg/dL (ref 0.57–1.00)
Globulin, Total: 2.3 g/dL (ref 1.5–4.5)
Glucose: 106 mg/dL — ABNORMAL HIGH (ref 65–99)
Potassium: 4.2 mmol/L (ref 3.5–5.2)
Sodium: 140 mmol/L (ref 134–144)
Total Protein: 6 g/dL (ref 6.0–8.5)
eGFR: 59 mL/min/{1.73_m2} — ABNORMAL LOW (ref >59)

## 2020-10-23 LAB — PROTIME-INR
INR: 1.4 — ABNORMAL HIGH (ref 0.9–1.2)
Prothrombin Time: 14 s — ABNORMAL HIGH (ref 9.1–12.0)

## 2020-10-23 LAB — CARDIOVASCULAR RISK ASSESSMENT

## 2020-10-23 LAB — HEMOGLOBIN A1C
Est. average glucose Bld gHb Est-mCnc: 146 mg/dL
Hgb A1c MFr Bld: 6.7 % — ABNORMAL HIGH (ref 4.8–5.6)

## 2020-10-25 ENCOUNTER — Other Ambulatory Visit: Payer: Self-pay

## 2020-10-25 ENCOUNTER — Ambulatory Visit (INDEPENDENT_AMBULATORY_CARE_PROVIDER_SITE_OTHER): Payer: Medicare Other

## 2020-10-25 DIAGNOSIS — E782 Mixed hyperlipidemia: Secondary | ICD-10-CM

## 2020-10-25 DIAGNOSIS — G720 Drug-induced myopathy: Secondary | ICD-10-CM

## 2020-10-25 DIAGNOSIS — E1121 Type 2 diabetes mellitus with diabetic nephropathy: Secondary | ICD-10-CM | POA: Diagnosis not present

## 2020-10-25 DIAGNOSIS — I5030 Unspecified diastolic (congestive) heart failure: Secondary | ICD-10-CM | POA: Diagnosis not present

## 2020-10-25 NOTE — Progress Notes (Signed)
Chronic Care Management Pharmacy Note    10/25/2020 Name:  Bridget [name] MRN:  [id number] DOB:  April 13, 1941   Plan Recommendations:   Pharmacist reviewed Endocrinologist recommendation for patient consider adding Farxiga for blood sugar, heart failure and kidney benefits. Patient is considering and wanted to look up medication. Patient is already approved with [place] and [place] so could receive mediation at no cost. Pharmacist will consult with and request prescription from Dr. Tobie Poet if patient is willing.   Reviewed patient labs during visit and scheduled 2 week INR recheck as advised.   Subjective: Bridget [name] is an 80 y.o. year old female who is a primary patient of Cox, Kirsten, MD.  The CCM team was consulted for assistance with disease management and care coordination needs.    Engaged with patient by telephone for follow up visit in response to provider referral for pharmacy case management and/or care coordination services.   Consent to Services:  The patient was given information about Chronic Care Management services, agreed to services, and gave verbal consent prior to initiation of services.  Please see initial visit note for detailed documentation.   Patient Care Team: [name], MD as PCP - General (Family Medicine) Richmond Campbell, MD as Consulting Physician (Gastroenterology) Elayne Snare, MD as Consulting Physician (Endocrinology) Burnice Logan, [facility] as Pharmacist (Pharmacist) Marice Potter, MD as Consulting Physician (Oncology) Flossie Buffy., MD as Referring Physician (Cardiology)  Recent office visits: 10/22/2020 - fluvastatin 20 mg daily at bedtime ordered. INR elevated could be why bruising in addition to Xarelto. LDL 111. Blood count normal. Kidney function normal. Liver function normal. A1c 6.7%.   Recent consult visits: 10/13/2020 - HEM/ONC -stable anemia of chronic disease. Follow-up in 6 months.  10/08/2020 - Cardiology - device check.   09/27/2020 - pacemaker upgrade.  09/24/2020 - Endo - consider SGLT2 but usually doesn't want brand name medications. May need to be coordinated with cardio. Increase evening metformin to 1000 mg in the evening. Fluvastatin recommended.   Hospital visits:   Objective:  Lab Results  Component Value Date   CREATININE 0.99 10/22/2020   BUN 26 10/22/2020   GFR 32.91 (L) 03/23/2020   GFRNONAA 60 09/08/2020   GFRAA 69 09/08/2020   NA 140 10/22/2020   K 4.2 10/22/2020   CALCIUM 9.4 10/22/2020   CO2 22 10/22/2020    Lab Results  Component Value Date/Time   HGBA1C 6.7 (H) 10/22/2020 09:01 AM   HGBA1C 6.0 (A) 09/24/2020 09:30 AM   HGBA1C 5.9 (A) 03/23/2020 08:35 AM   HGBA1C 6.3 (H) 11/20/2019 09:31 AM   GFR 32.91 (L) 03/23/2020 08:52 AM   GFR 54.56 (L) 01/02/2019 09:32 AM   MICROALBUR <0.7 03/23/2020 10:49 AM   MICROALBUR 1.8 01/02/2019 09:32 AM    Last diabetic Eye exam:  Lab Results  Component Value Date/Time   HMDIABEYEEXA No Retinopathy 01/14/2020 03:00 PM    Last diabetic Foot exam: No results found for: HMDIABFOOTEX   Lab Results  Component Value Date   CHOL 220 (H) 10/22/2020   HDL 78 10/22/2020   LDLCALC 111 (H) 10/22/2020   LDLDIRECT 94.2 12/05/2013   TRIG 182 (H) 10/22/2020   CHOLHDL 2.8 10/22/2020    Hepatic Function Latest Ref Rng & Units 10/22/2020 10/13/2020 09/08/2020  Total Protein 6.0 - 8.5 g/dL 6.0 - 6.1  Albumin 3.7 - 4.7 g/dL 3.7 3.8 3.9  AST 0 - 40 IU/L 30 36(A) 35  ALT 0 - 32 IU/L 23 21  31  Alk Phosphatase 44 - 121 IU/L 114 101 108  Total Bilirubin 0.0 - 1.2 mg/dL 0.4 - 0.4    Lab Results  Component Value Date/Time   TSH 1.64 03/23/2020 08:52 AM   TSH 1.40 01/02/2019 09:32 AM   FREET4 1.28 03/23/2020 08:52 AM   FREET4 1.02 01/02/2019 09:32 AM    CBC Latest Ref Rng & Units 10/22/2020 10/13/2020 09/08/2020  WBC 3.4 - 10.8 x10E3/uL 7.9 8.1 8.6  Hemoglobin 11.1 - 15.9 g/dL 12.8 13.0 13.5  Hematocrit 34.0 - 46.6 % 39.4 39 40.8  Platelets 150 - 450  x10E3/uL 160 172 183    No results found for: VD25OH  Clinical ASCVD: No  The 10-year ASCVD risk score Mikey Bussing DC Jr., et al., 2013) is: 35.5%   Values used to calculate the score:     Age: 36 years     Sex: Female     Is Non-Hispanic African American: No     Diabetic: Yes     Tobacco smoker: No     Systolic Blood Pressure: 956 mmHg     Is BP treated: Yes     HDL Cholesterol: 78 mg/dL     Total Cholesterol: 220 mg/dL    Depression screen [facility] 2/9 10/22/2020 06/25/2020 06/06/2020  Decreased Interest 0 0 0  Down, Depressed, Hopeless 1 0 0  PHQ - 2 Score 1 0 0  Altered sleeping 1 - -  Tired, decreased energy 0 - -  Change in appetite 1 - -  Feeling bad or failure about yourself  0 - -  Trouble concentrating 2 - -  Moving slowly or fidgety/restless 0 - -  Suicidal thoughts 0 - -  PHQ-9 Score 5 - -  Difficult doing work/chores Somewhat difficult - -     Social History   Tobacco Use  Smoking Status Never Smoker  Smokeless Tobacco Never Used   BP Readings from Last 3 Encounters:  10/22/20 110/70  10/13/20 134/63  09/24/20 126/76   Pulse Readings from Last 3 Encounters:  10/22/20 68  10/13/20 68  09/24/20 78   Wt Readings from Last 3 Encounters:  10/22/20 107 lb (48.5 kg)  10/13/20 106 lb 3.2 oz (48.2 kg)  09/24/20 106 lb (48.1 kg)    Assessment/Interventions: Review of patient past medical history, allergies, medications, health status, including review of consultants reports, laboratory and other test data, was performed as part of comprehensive evaluation and provision of chronic care management services.   SDOH:  (Social Determinants of Health) assessments and interventions performed: Yes   CCM Care Plan  Allergies  Allergen Reactions  . Atorvastatin Other (See Comments)    "muscle Cramps"  . Budesonide Other (See Comments)    "fatigue"  . Gabapentin Other (See Comments)    "fatigue"  . Metoclopramide Other (See Comments)    "mayalgia"  . Rosuvastatin  Other (See Comments)    "muscle cramps"  . Simvastatin Other (See Comments)    "muscle Cramps"  . Sulfamethoxazole-Trimethoprim Rash    Medications Reviewed Today    Reviewed by Burnice Logan, [facility] (Pharmacist) on 10/25/20 at 1032  Med List Status: <None>  Medication Order Taking? Sig Documenting Provider Last Dose Status Informant  albuterol (VENTOLIN HFA) 108 (90 Base) MCG/ACT inhaler [id number] Yes INHALE 1 TO 2 PUFFS BY MOUTH EVERY 4 HOURS AS NEEDED FOR WHEEZING Cox, Kirsten, MD Taking Active   aspirin 81 MG chewable tablet [id number] Yes Chew 81 mg by mouth in the morning.  [provider] Taking Active Self  Blood Glucose Monitoring Suppl (ONETOUCH VERIO FLEX SYSTEM) w/Device KIT [id number] Yes Check sugar once daily Elayne Snare, MD Taking Active   Budeson-Glycopyrrol-Formoterol (BREZTRI AEROSPHERE) 160-9-4.8 MCG/ACT AERO [id number] Yes Inhale 2 puffs into the lungs 2 (two) times daily. Cox, Kirsten, MD Taking Active   calcium-vitamin D (OSCAL WITH D) 500-200 MG-UNIT TABS tablet [id number] Yes Take 1 tablet by mouth daily. [provider] Taking Active   carvedilol (COREG) 25 MG tablet [id number] Yes Take 25 mg by mouth 2 (two) times daily with a meal. Taking 37.5 mg bid [provider] Taking Active Self  Cholecalciferol (VITAMIN D-3) 1000 UNITS CAPS [id number] Yes Take 1,000 Units by mouth daily. [provider] Taking Active   fluvastatin (LESCOL) 20 MG capsule [id number] Yes Take 1 capsule (20 mg total) by mouth at bedtime. Cox, Kirsten, MD Taking Active   furosemide (LASIX) 20 MG tablet [id number] Yes Take 1 tablet (20 mg total) by mouth daily. Cox, Kirsten, MD Taking Active   Glucagon, rDNA, (GLUCAGON EMERGENCY) 1 MG KIT [id number]  Inject 1 kit as directed once for 1 dose. If sugars < 70, if you cannot increase with oral glucose. [name], MD  Expired 05/20/20 2359   glucose blood (ONETOUCH VERIO) test strip [id number] Yes 1 each by Other route as needed  for other. Use as instructed to check blood sugar once daily  DX CODE E11.9 Elayne Snare, MD Taking Active   losartan (COZAAR) 25 MG tablet [id number] Yes Take 25 mg by mouth daily. [provider] Taking Active   Magnesium 250 MG TABS [id number] Yes Take 250 mg by mouth daily. [provider] Taking Active   metFORMIN (GLUCOPHAGE) 500 MG tablet [id number] Yes TAKE 1 TABLET BY MOUTH TWICE DAILY WITH MORNING AND EVENING MEALS Elayne Snare, MD Taking Active   Multiple Vitamins-Minerals (CENTRUM SILVER ULTRA WOMENS PO) [id number] Yes Take by mouth daily. [provider] Taking Active   nitroGLYCERIN (NITROSTAT) 0.4 MG SL tablet [id number] Yes Place 0.4 mg under the tongue every 5 (five) minutes as needed for chest pain. [provider] Taking Active   OneTouch Delica Lancets 10G MISC [id number] Yes USE 1  TO CHECK GLUCOSE ONCE DAILY *APPOINTMENT  REQUIRED  FOR  FUTURE  REFILLS Elayne Snare, MD Taking Active   polyethylene glycol [facility] / GLYCOLAX) packet [id number] Yes Take 17 g by mouth daily as needed. [provider] Taking Active   Rivaroxaban (XARELTO) 15 MG TABS tablet [id number] Yes Take 1 tablet (15 mg total) by mouth daily. Cox, Kirsten, MD Taking Active   venlafaxine XR (EFFEXOR-XR) 75 MG 24 hr capsule [id number] Yes Take 1 capsule by mouth once daily Cox, Kirsten, MD Taking Active           Patient Active Problem List   Diagnosis Date Noted  . Anemia of chronic renal failure 10/13/2020  . Chronic kidney disease, stage II (mild) 10/13/2020  . Drug-induced myopathy 06/25/2020  . Congestive heart failure with LV diastolic dysfunction, NYHA class 3 ([place]) 06/25/2020  . Pacemaker 06/25/2020  . Major depressive disorder, recurrent episode, mild ([place]) 06/25/2020  . Nonischemic dilated cardiomyopathy ([place]) 06/18/2020  . Heart failure with reduced ejection fraction, NYHA class III ([place]) 05/29/2020  . History of cardiac cath 03/19/2020  . LV dysfunction  02/04/2020  . Dyspnea on exertion 01/06/2020  . Hypoxia 01/06/2020  . Diabetic glomerulopathy ([place]) 11/20/2019  . Diarrhea 11/20/2019  . Other constipation 11/20/2019  . Night sweats 11/20/2019  .  Adhesive capsulitis of right shoulder 11/20/2019  . At moderate risk for fall 11/20/2019  . TIA (transient ischemic attack) 06/03/2018  . History of DVT (deep vein thrombosis) 10/13/2016  . Essential hypertension 06/06/2013  . Type II or unspecified type diabetes mellitus without mention of complication, not stated as uncontrolled 06/05/2013  . Mixed hyperlipidemia 06/05/2013  . Thyroid nodule 06/05/2013    Immunization History  Administered Date(s) Administered  . Fluad Quad(high Dose 65+) 05/20/2020  . Influenza, High Dose Seasonal PF 06/05/2017  . Influenza,trivalent, recombinat, inj, PF 05/14/2012  . Influenza-Unspecified 07/02/2014, 04/13/2015, 04/24/2016, 06/14/2018  . PFIZER(Purple Top)SARS-COV-2 Vaccination 08/29/2019, 09/19/2019, 05/14/2020  . Pneumococcal Conjugate-13 04/27/2014  . Pneumococcal Polysaccharide-23 07/14/2010, 07/14/2010, 12/02/2015  . Tdap 11/01/2017    Conditions to be addressed/monitored:  Hypertension, Hyperlipidemia, Diabetes, Heart Failure, Depression, Osteoporosis and anemia  Care Plan : [place]  Updates made by Burnice Logan, [place] since 10/25/2020 12:00 AM    Problem: dm, chf, hld   Priority: High  Onset Date: 10/25/2020    Long-Range Goal: Disease State Management   Start Date: 10/25/2020  Expected End Date: 10/25/2021  This Visit's Progress: On track  Priority: High  Note:   Current Barriers:  . Unable to independently afford treatment regimen  Pharmacist Clinical Goal(s):  Marland Kitchen Over the next 90 days, patient will verbalize ability to afford treatment regimen through collaboration with PharmD and provider.   Interventions: . 1:1 collaboration with [name], MD regarding development and update of comprehensive plan of care as  evidenced by provider attestation and co-signature . Inter-disciplinary care team collaboration (see longitudinal plan of care) . Comprehensive medication review performed; medication list updated in electronic medical record  Hypertension (BP goal <130/80) -Controlled -Current treatment: . Carvedilol 25 mg - 37.5 mg bid . Losartan 25 mg daily  -Medications previously tried:   -Current home readings: 130/70, 115/66, 129/63  pulse 67, 72, 76 -Current dietary habits: eats healthy overall. Meat and vegetables mainly.  -Current exercise habits: walking some -Denies hypotensive/hypertensive symptoms -Educated on Daily salt intake goal < 2300 mg; Exercise goal of 150 minutes per week; Importance of home blood pressure monitoring; -Counseled to monitor BP at home daily, document, and provide log at future appointments -Counseled on diet and exercise extensively Recommended to continue current medication  Diabetes (A1c goal <7%) -Controlled -Current medications: . metformin 500 mg morning and 500 mg evening meals . Glucagon 1 mg as directed -Medications previously tried: pioglitazone  -Current home glucose readings . fasting glucose: 120, 110, 163, 111, 94, 74, 128, 135, 123, 123, 114  . post prandial glucose: not taking  -Denies hypoglycemic/hyperglycemic symptoms -Current meal patterns:  . Patient reports healthy diet and mainly eats at home.  -Current exercise: walking  -Educated on A1c and blood sugar goals; Complications of diabetes including kidney damage, retinal damage, and cardiovascular disease; Exercise goal of 150 minutes per week; -Counseled to check feet daily and get yearly eye exams -Counseled on diet and exercise extensively Counseled on considering SGLT2 medication for blood sugar, chf and kidney benefits per endocrinology recommendation.  Recommended looking up [facility] [place] since patient is already approved for the patient assistance program.   Heart Failure (Goal:  manage symptoms and prevent exacerbations) -Controlled -Last ejection fraction: 20-25% (Date: 07/2020) -HF type: Diastolic -NYHA Class: III (marked limitation of activity) -Current treatment: . carvedilol 25 mg - 37.5 mg bid  . Furosemide 20 mg daily  . Losartan 25 mg daily  . Nitroglycerin 0.4 mg sl  every 5 minutes prn chest pain -Medications previously tried: none reported -Current home BP/HR readings: well controlled currently -Current dietary habits: eating healthy diet. Meat and vegetables.  -Current exercise habits: walking more and working to increase stamina since pacemaker updated 09/27/2020.  -Educated on Benefits of medications for managing symptoms and prolonging life Importance of blood pressure control -Counseled on diet and exercise extensively Recommended to continue current medication Recommended considering Farxiga.   Hyperlipidemia: (LDL goal < 55) -Uncontrolled -Current treatment: . fluvastatin 20 mg daily at bedtime  . Aspirin 81 mg daily  -Medications previously tried: atorvastatin, rosuvastatin, simvastatin   -Current dietary patterns: overall healthy diet.  -Current exercise habits: walking more and working to rebuild stamina.  -Educated on Cholesterol goals;  Benefits of statin for ASCVD risk reduction; Importance of limiting foods high in cholesterol; Exercise goal of 150 minutes per week; -Counseled on diet and exercise extensively Recommended to continue current medication   Patient Goals/Self-Care Activities . Over the next 90 days, patient will:  - take medications as prescribed focus on medication adherence by using pill box check glucose daily, document, and provide at future appointments check blood pressure daily, document, and provide at future appointments Contact provider or pharmacist if patient decides to trial [place].   Follow Up Plan: Telephone follow up appointment with care management team member scheduled for: 02/28/2021       Medication Assistance: Breztri[place]ained through [place] medication assistance program.  Enrollment ends 08/13/2021  Patient's preferred pharmacy is:  [facility] [address] [place] [address] [place] [address] Phone: [phone] Fax: [phone]  Uses pill box? Yes Pt endorses 100% compliance  We discussed: Benefits of medication synchronization, packaging and delivery as well as enhanced pharmacist oversight with [employer]. Patient decided to: Continue current medication management strategy  Care Plan and Follow Up Patient Decision:  Patient agrees to Care Plan and Follow-up.  Plan: Telephone follow up appointment with care management team member scheduled for:  02/2021

## 2020-10-25 NOTE — Patient Instructions (Addendum)
Visit Information  Goals Addressed            This Visit's Progress   . Learn More About My Health       Timeframe:  Long-Range Goal Priority:  High Start Date:     10/25/20                  Expected End Date:     10/25/2021                   Follow Up Date 02/28/2021    - tell my story and reason for my visit - make a list of questions - repeat what I heard to make sure I understand - bring a list of my medicines to the visit    Why is this important?    The best way to learn about your health and care is by talking to the doctor and nurse.   They will answer your questions and give you information in the way that you like best.    Notes:     Marland Kitchen Monitor and Manage My Blood Sugar-Diabetes Type 2       Timeframe:  Long-Range Goal Priority:  High Start Date:          10/25/20                    Expected End Date: 10/25/2021  Follow Up Date 02/28/2021    - check blood sugar at prescribed times - check blood sugar if I feel it is too high or too low - take the blood sugar log to all doctor visits    Why is this important?    Checking your blood sugar at home helps to keep it from getting very high or very low.   Writing the results in a diary or log helps the doctor know how to care for you.   Your blood sugar log should have the time, date and the results.   Also, write down the amount of insulin or other medicine that you take.   Other information, like what you ate, exercise done and how you were feeling, will also be helpful.     Notes:     . Track and Manage Symptoms-Heart Failure       Timeframe:  Long-Range Goal Priority:  High Start Date:         10/25/20                     Expected End Date:     10/25/2021                  Follow Up Date 02/28/2021    - eat more whole grains, fruits and vegetables, lean meats and healthy fats - track symptoms and what helps feel better or worse    Why is this important?    You will be able to handle your  symptoms better if you keep track of them.   Making some simple changes to your lifestyle will help.   Eating healthy is one thing you can do to take good care of yourself.    Notes:       Patient Care Plan: CCM Pharmacy Care Plan    Problem Identified: dm, chf, hld   Priority: High  Onset Date: 10/25/2020    Long-Range Goal: Disease State Management   Start Date: 10/25/2020  Expected End Date: 10/25/2021  This Visit's Progress: On track  Priority: High  Note:   Current Barriers:  . Unable to independently afford treatment regimen  Pharmacist Clinical Goal(s):  Marland Kitchen. Over the next 90 days, patient will verbalize ability to afford treatment regimen through collaboration with PharmD and provider.   Interventions: . 1:1 collaboration with Blane Oharaox, Kirsten, MD regarding development and update of comprehensive plan of care as evidenced by provider attestation and co-signature . Inter-disciplinary care team collaboration (see longitudinal plan of care) . Comprehensive medication review performed; medication list updated in electronic medical record  Hypertension (BP goal <130/80) -Controlled -Current treatment: . Carvedilol 25 mg - 37.5 mg bid . Losartan 25 mg daily  -Medications previously tried:   -Current home readings: 130/70, 115/66, 129/63  pulse 67, 72, 76 -Current dietary habits: eats healthy overall. Meat and vegetables mainly.  -Current exercise habits: walking some -Denies hypotensive/hypertensive symptoms -Educated on Daily salt intake goal < 2300 mg; Exercise goal of 150 minutes per week; Importance of home blood pressure monitoring; -Counseled to monitor BP at home daily, document, and provide log at future appointments -Counseled on diet and exercise extensively Recommended to continue current medication  Diabetes (A1c goal <7%) -Controlled -Current medications: . metformin 500 mg morning and 500 mg evening meals . Glucagon 1 mg as directed -Medications previously  tried: pioglitazone  -Current home glucose readings . fasting glucose: 120, 110, 163, 111, 94, 74, 128, 135, 123, 123, 114  . post prandial glucose: not taking  -Denies hypoglycemic/hyperglycemic symptoms -Current meal patterns:  . Patient reports healthy diet and mainly eats at home.  -Current exercise: walking  -Educated on A1c and blood sugar goals; Complications of diabetes including kidney damage, retinal damage, and cardiovascular disease; Exercise goal of 150 minutes per week; -Counseled to check feet daily and get yearly eye exams -Counseled on diet and exercise extensively Counseled on considering SGLT2 medication for blood sugar, chf and kidney benefits per endocrinology recommendation.  Recommended looking up Marcelline DeistFarxiga since patient is already approved for the patient assistance program.   Heart Failure (Goal: manage symptoms and prevent exacerbations) -Controlled -Last ejection fraction: 20-25% (Date: 07/2020) -HF type: Diastolic -NYHA Class: III (marked limitation of activity) -Current treatment: . carvedilol 25 mg - 37.5 mg bid  . Furosemide 20 mg daily  . Losartan 25 mg daily  . Nitroglycerin 0.4 mg sl every 5 minutes prn chest pain -Medications previously tried: none reported -Current home BP/HR readings: well controlled currently -Current dietary habits: eating healthy diet. Meat and vegetables.  -Current exercise habits: walking more and working to increase stamina since pacemaker updated 09/27/2020.  -Educated on Benefits of medications for managing symptoms and prolonging life Importance of blood pressure control -Counseled on diet and exercise extensively Recommended to continue current medication Recommended considering Farxiga.   Hyperlipidemia: (LDL goal < 55) -Uncontrolled -Current treatment: . fluvastatin 20 mg daily at bedtime  . Aspirin 81 mg daily  -Medications previously tried: atorvastatin, rosuvastatin, simvastatin   -Current dietary patterns:  overall healthy diet.  -Current exercise habits: walking more and working to rebuild stamina.  -Educated on Cholesterol goals;  Benefits of statin for ASCVD risk reduction; Importance of limiting foods high in cholesterol; Exercise goal of 150 minutes per week; -Counseled on diet and exercise extensively Recommended to continue current medication   Patient Goals/Self-Care Activities . Over the next 90 days, patient will:  - take medications as prescribed focus on medication adherence by using pill box check glucose daily, document, and provide at future appointments check blood pressure daily, document, and  provide at future appointments Contact provider or pharmacist if patient decides to trial [place].   Follow Up Plan: Telephone follow up appointment with care management team member scheduled for: 02/28/2021      The patient verbalized understanding of instructions, educational materials, and care plan provided today and declined offer to receive copy of patient instructions, educational materials, and care plan.  Telephone follow up appointment with pharmacy team member scheduled for: 02/28/2021  Earvin Hansen, [facility] Dapagliflozin tablets What is this medicine? DAPAGLIFLOZIN (DAP a gli FLOE zin) controls blood sugar in people with diabetes. It is used with lifestyle changes like diet and exercise. It also treats heart failure and kidney disease. It may lower the risk for treatment of heart failure in the hospital or worsened kidney disease. This medicine may be used for other purposes; ask your health care provider or pharmacist if you have questions. COMMON BRAND NAME(S): Marcelline Deist What should I tell my health care provider before I take this medicine? They need to know if you have any of these conditions:  dehydration  diabetic ketoacidosis  diet low in salt  eating less due to illness, surgery, dieting, or any other reason  having surgery  history of pancreatitis or  pancreas problems  history of yeast infection of the penis or vagina  if you often drink alcohol  infection in the bladder, kidneys, or urinary tract  kidney disease  low blood pressure  on dialysis  problems urinating  type 1 diabetes  uncircumcised female  an unusual or allergic reaction to dapagliflozin, other medicines, foods, dyes, or preservatives  pregnant or trying to get pregnant  breast-feeding How should I use this medicine? Take this medicine by mouth with water. Take it as directed on the prescription label at the same time every day. You can take it with or without food. If it upsets your stomach, take it with food. Keep taking it unless your health care provider tells you to stop. A special MedGuide will be given to you by the pharmacist with each prescription and refill. Be sure to read this information carefully each time. Talk to your health care provider about the use of this medicine in children. Special care may be needed. Overdosage: If you think you have taken too much of this medicine contact a poison control center or emergency room at once. NOTE: This medicine is only for you. Do not share this medicine with others. What if I miss a dose? If you miss a dose, take it as soon as you can. If it is almost time for your next dose, take only that dose. Do not take double or extra doses. What may interact with this medicine? Interactions are not expected. This list may not describe all possible interactions. Give your health care provider a list of all the medicines, herbs, non-prescription drugs, or dietary supplements you use. Also tell them if you smoke, drink alcohol, or use illegal drugs. Some items may interact with your medicine. What should I watch for while using this medicine? Visit your health care provider for regular checks on your progress. Tell your health care provider if your symptoms do not start to get better or if they get worse. This  medicine can cause a serious condition in which there is too much acid in the blood. If you develop nausea, vomiting, stomach pain, unusual tiredness, or breathing problems, stop taking this medicine and call your doctor right away. If possible, use a ketone dipstick to check for  ketones in your urine. Check with your health care provider if you have severe diarrhea, nausea, and vomiting, or if you sweat a lot. The loss of too much body fluid may make it dangerous for you to take this medicine. A test called the HbA1C (A1C) will be monitored. This is a simple blood test. It measures your blood sugar control over the last 2 to 3 months. You will receive this test every 3 to 6 months. Learn how to check your blood sugar. Learn the symptoms of low and high blood sugar and how to manage them. Always carry a quick-source of sugar with you in case you have symptoms of low blood sugar. Examples include hard sugar candy or glucose tablets. Make sure others know that you can choke if you eat or drink when you develop serious symptoms of low blood sugar, such as seizures or unconsciousness. Get medical help at once. Tell your health care provider if you have high blood sugar. You might need to change the dose of your medicine. If you are sick or exercising more than usual, you may need to change the dose of your medicine. Do not skip meals. Ask your health care provider if you should avoid alcohol. Many nonprescription cough and cold products contain sugar or alcohol. These can affect blood sugar. Wear a medical ID bracelet or chain. Carry a card that describes your condition. List the medicines and doses you take on the card. What side effects may I notice from receiving this medicine? Side effects that you should report to your doctor or health care professional as soon as possible:  allergic reactions (skin rash, itching or hives, swelling of the face, lips, or tongue)  breathing  problems  dizziness  feeling faint or lightheaded, falls  genital infection (fever; tenderness, redness, or swelling in the genitals or area from the genitals to the back of the rectum)  kidney injury (trouble passing urine or change in the amount of urine)  low blood sugar (feeling anxious; confusion; dizziness; increased hunger; unusually weak or tired; increased sweating; shakiness; cold, clammy skin; irritable; headache; blurred vision; fast heartbeat; loss of consciousness)  muscle weakness  nausea, vomiting, unusual stomach upset or pain  new pain or tenderness, change in skin color, sores or ulcers, or infection in legs or feet  penile discharge, itching, or pain  unusual tiredness  unusual vaginal discharge, itching, or odor  urinary tract infection (fever; chills; a burning feeling when urinating; urgent need to urinate more often; blood in the urine; back pain) Side effects that usually do not require medical attention (report to your doctor or health care professional if they continue or are bothersome):  mild increase in urination  thirsty This list may not describe all possible side effects. Call your doctor for medical advice about side effects. You may report side effects to FDA at 1-800-FDA-1088. Where should I keep my medicine? Keep out of the reach of children and pets. Store at room temperature between 20 and 25 degrees C (68 and 77 degrees F). Get rid of any unused medicine after the expiration date. To get rid of medicines that are no longer needed or have expired:  Take the medicine to a medicine take-back program. Check with your pharmacy or law enforcement to find a location.  If you cannot return the medicine, check the label or package insert to see if the medicine should be thrown out in the garbage or flushed down the toilet. If you are not  sure, ask your health care provider. If it is safe to put it in the trash, take the medicine out of the  container. Mix the medicine with cat litter, dirt, coffee grounds, or other unwanted substance. Seal the mixture in a bag or container. Put it in the trash. NOTE: This sheet is a summary. It may not cover all possible information. If you have questions about this medicine, talk to your doctor, pharmacist, or health care provider.  2021 Elsevier/Gold Standard (2020-01-07 13:18:47)

## 2020-10-26 ENCOUNTER — Other Ambulatory Visit: Payer: Self-pay | Admitting: Endocrinology

## 2020-10-26 DIAGNOSIS — E119 Type 2 diabetes mellitus without complications: Secondary | ICD-10-CM

## 2020-10-30 ENCOUNTER — Encounter: Payer: Self-pay | Admitting: Family Medicine

## 2020-11-01 ENCOUNTER — Other Ambulatory Visit: Payer: Self-pay

## 2020-11-01 MED ORDER — LOVASTATIN 40 MG PO TABS: 40.0000 mg | ORAL_TABLET | Freq: Every day | ORAL | 0 refills | Status: DC

## 2020-11-05 ENCOUNTER — Ambulatory Visit: Payer: Medicare Other

## 2020-11-05 ENCOUNTER — Telehealth (INDEPENDENT_AMBULATORY_CARE_PROVIDER_SITE_OTHER): Payer: Medicare Other | Admitting: Nurse Practitioner

## 2020-11-05 ENCOUNTER — Other Ambulatory Visit: Payer: Self-pay

## 2020-11-05 ENCOUNTER — Encounter: Payer: Self-pay | Admitting: Nurse Practitioner

## 2020-11-05 VITALS — Ht 62.0 in | Wt 107.0 lb

## 2020-11-05 DIAGNOSIS — N3001 Acute cystitis with hematuria: Secondary | ICD-10-CM | POA: Diagnosis not present

## 2020-11-05 DIAGNOSIS — R300 Dysuria: Secondary | ICD-10-CM

## 2020-11-05 DIAGNOSIS — R233 Spontaneous ecchymoses: Secondary | ICD-10-CM | POA: Diagnosis not present

## 2020-11-05 LAB — POCT URINALYSIS DIPSTICK
Glucose, UA: NEGATIVE
Ketones, UA: NEGATIVE
Nitrite, UA: NEGATIVE
Protein, UA: POSITIVE — AB
Spec Grav, UA: 1.025 (ref 1.010–1.025)
Urobilinogen, UA: NEGATIVE E.U./dL — AB
pH, UA: 6 (ref 5.0–8.0)

## 2020-11-05 MED ORDER — NITROFURANTOIN MONOHYD MACRO 100 MG PO CAPS: 100.0000 mg | ORAL_CAPSULE | Freq: Two times a day (BID) | ORAL | 0 refills | Status: DC

## 2020-11-05 NOTE — Progress Notes (Signed)
Virtual Visit via Telephone Note   This visit type was conducted due to national recommendations for restrictions regarding the COVID-19 Pandemic (e.g. social distancing) in an effort to limit this patient's exposure and mitigate transmission in our community.  Due to her co-morbid illnesses, this patient is at least at moderate risk for complications without adequate follow up.  This format is felt to be most appropriate for this patient at this time.  The patient did not have access to video technology/had technical difficulties with video requiring transitioning to audio format only (telephone).  All issues noted in this document were discussed and addressed.  No physical exam could be performed with this format.  Patient verbally consented to a telehealth visit.   Date:  11/05/2020   ID:  Bridget [name], DOB 01-21-1941, MRN [id number]  Patient Location: Home Provider Location: Office/Clinic  PCP:  [name], MD   Evaluation Performed:  Follow-Up Visit  Chief Complaint:  Dysuria  History of Present Illness:    Bridget [name] is a 80 y.o. female with dysuria, frequency, and urgency. She denies flank pain, fever, or myalgias. Onset of symptoms was 1-week ago. She denies previous treatment. She has past history of kidney stones. She denies chronic UTIs.Pt gave urine specimen this a.m. when at clinic for lab work.  The patient does not have symptoms concerning for COVID-19 infection (fever, chills, cough, or new shortness of breath).    Past Medical History:  Diagnosis Date  . Asthma   . Atrioventricular block   . Diabetes mellitus without complication ([place])   . Fibromyalgia   . Heart disease   . Hypertension   . Insomnia   . Kidney disease   . Kidney stones 01/12/1983  . Major depression   . Major depressive disorder, recurrent, mild ([place])   . Mixed hyperlipidemia   . Osteoporosis 01/27/2019  . Other amnesia   . 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 Statin myopathy   . Thyroid disease     Past Surgical History:  Procedure Laterality Date  . ABDOMINAL HYSTERECTOMY  03/05/1998   pt does not think this was a total hysterectomy  . APPENDECTOMY  12/31/1988  . BREAST LUMPECTOMY Left 05/22/1996  . pacemaker  09/27/2016    Family History  Problem Relation Age of Onset  . High blood pressure Mother   . Diabetes Mother        no medication needed  . Kidney disease Mother   . Pneumonia Mother   . High blood pressure Father   . Cancer Maternal Grandmother        Leukemia  . Cancer Other        Ovarian    Social History   Socioeconomic History  . Marital status: Married    Spouse name: Not on file  . Number of children: 2  . Years of education: Not on file  . Highest education level: 9th grade  Occupational History  . Not on file  Tobacco Use  . Smoking status: Never Smoker  . Smokeless tobacco: Never Used  Vaping Use  . Vaping Use: Never used  Substance and Sexual Activity  . Alcohol use: Never  . Drug use: Never  . Sexual activity: Not on file  Other Topics Concern  . Not on file  Social History Narrative   Lives at home with her husband   Right handed   Caffeine: mostly tea, 0-2 cups a day   Social Determinants  of Health   Financial Resource Strain: Not on file  Food Insecurity: Not on file  Transportation Needs: Not on file  Physical Activity: Not on file  Stress: Not on file  Social Connections: Not on file  Intimate Partner Violence: Not on file    Outpatient Medications Prior to Visit  Medication Sig Dispense Refill  . lovastatin (MEVACOR) 40 MG tablet Take 1 tablet (40 mg total) by mouth at bedtime. 90 tablet 0  . albuterol (VENTOLIN HFA) 108 (90 Base) MCG/ACT inhaler INHALE 1 TO 2 PUFFS BY MOUTH EVERY 4 HOURS AS NEEDED FOR WHEEZING 9 g 0  . aspirin 81 MG chewable tablet Chew 81 mg by mouth in the morning.     . Blood Glucose Monitoring Suppl (ONETOUCH VERIO FLEX SYSTEM) w/Device KIT  Check sugar once daily 1 kit 0  . Budeson-Glycopyrrol-Formoterol (BREZTRI AEROSPHERE) 160-9-4.8 MCG/ACT AERO Inhale 2 puffs into the lungs 2 (two) times daily. 10.7 g 5  . calcium-vitamin D (OSCAL WITH D) 500-200 MG-UNIT TABS tablet Take 1 tablet by mouth daily.    . carvedilol (COREG) 25 MG tablet Take 25 mg by mouth 2 (two) times daily with a meal. Taking 37.5 mg bid    . Cholecalciferol (VITAMIN D-3) 1000 UNITS CAPS Take 1,000 Units by mouth daily.    . fluvastatin (LESCOL) 20 MG capsule Take 1 capsule (20 mg total) by mouth at bedtime. 30 capsule 2  . furosemide (LASIX) 20 MG tablet Take 1 tablet (20 mg total) by mouth daily. 30 tablet 0  . Glucagon, rDNA, (GLUCAGON EMERGENCY) 1 MG KIT Inject 1 kit as directed once for 1 dose. If sugars < 70, if you cannot increase with oral glucose. 1 kit 1  . glucose blood (ONETOUCH VERIO) test strip 1 each by Other route as needed for other. Use as instructed to check blood sugar once daily  DX CODE E11.9 100 each 1  . losartan (COZAAR) 25 MG tablet Take 25 mg by mouth daily.    . Magnesium 250 MG TABS Take 250 mg by mouth daily.    . metFORMIN (GLUCOPHAGE) 500 MG tablet TAKE 1 TABLET BY MOUTH TWICE DAILY WITH MORNING MEAL AND WITH EVENING MEAL 180 tablet 0  . Multiple Vitamins-Minerals (CENTRUM SILVER ULTRA WOMENS PO) Take by mouth daily.    . nitroGLYCERIN (NITROSTAT) 0.4 MG SL tablet Place 0.4 mg under the tongue every 5 (five) minutes as needed for chest pain.    [name] Delica Lancets 40X MISC USE 1  TO CHECK GLUCOSE ONCE DAILY *APPOINTMENT  REQUIRED  FOR  FUTURE  REFILLS 100 each 3  . polyethylene glycol (MIRALAX / GLYCOLAX) packet Take 17 g by mouth daily as needed.    . Rivaroxaban (XARELTO) 15 MG TABS tablet Take 1 tablet (15 mg total) by mouth daily. 90 tablet 1  . venlafaxine XR (EFFEXOR-XR) 75 MG 24 hr capsule Take 1 capsule by mouth once daily 90 capsule 1   No facility-administered medications prior to visit.    Allergies:    Atorvastatin, Budesonide, Gabapentin, Metoclopramide, Rosuvastatin, Simvastatin, and Sulfamethoxazole-trimethoprim   Social History   Tobacco Use  . Smoking status: Never Smoker  . Smokeless tobacco: Never Used  Vaping Use  . Vaping Use: Never used  Substance Use Topics  . Alcohol use: Never  . Drug use: Never     Review of Systems  Constitutional: Negative for chills, diaphoresis, fever and malaise/fatigue.  HENT: Negative.   Eyes: Negative.   Respiratory: Negative.  Cardiovascular: Negative.   Gastrointestinal: Negative for abdominal pain, constipation, diarrhea, nausea and vomiting.  Genitourinary: Positive for dysuria, frequency, hematuria (noted on UA) and urgency. Negative for flank pain.  Musculoskeletal: Negative for back pain.  Skin: Negative for rash.  Neurological: Negative.   Endo/Heme/Allergies: Negative.   Psychiatric/Behavioral: Negative.      Labs/Other Tests and Data Reviewed:    Recent Labs: 03/23/2020: TSH 1.64 09/08/2020: BNP 2,290.2 10/22/2020: ALT 23; BUN 26; Creatinine, Ser 0.99; Hemoglobin 12.8; Platelets 160; Potassium 4.2; Sodium 140   Recent Lipid Panel Lab Results  Component Value Date/Time   CHOL 220 (H) 10/22/2020 09:01 AM   TRIG 182 (H) 10/22/2020 09:01 AM   HDL 78 10/22/2020 09:01 AM   CHOLHDL 2.8 10/22/2020 09:01 AM   CHOLHDL 2 09/05/2017 08:59 AM   LDLCALC 111 (H) 10/22/2020 09:01 AM   LDLDIRECT 94.2 12/05/2013 05:13 PM    Wt Readings from Last 3 Encounters:  10/22/20 107 lb (48.5 kg)  10/13/20 106 lb 3.2 oz (48.2 kg)  09/24/20 106 lb (48.1 kg)     Objective:    Vital Signs:  Ht 5' 2"  (1.575 m)   Wt 107 lb (48.5 kg)   BMI 19.57 kg/m    Physical Exam No physical exam due to telemedicine visit  ASSESSMENT & PLAN:    1. Acute cystitis with hematuria - Urine Culture - nitrofurantoin, macrocrystal-monohydrate, (MACROBID) 100 MG capsule; Take 1 capsule (100 mg total) by mouth 2 (two) times daily.  Dispense: 14 capsule;  Refill: 0  2. Dysuria - POCT urinalysis dipstick -Pt declined pyridium for dysuria  Push fluids Notify office if symptoms fail to improve or worsen Seek emergency medical care if you develop a fever or back pain Follow-up as needed  COVID-19 Education: The signs and symptoms of COVID-19 were discussed with the patient and how to seek care for testing (follow up with PCP or arrange E-visit). The importance of social distancing was discussed today.   I spent 12 minutes dedicated to the care of this patient on the date of this encounter to include telephone time with the patient, as well as:EMR review and prescription medication management.  Follow Up:  In Person prn  Signed,  Rip Harbour, NP  11/05/2020 9:25 AM    [place]

## 2020-11-06 LAB — PROTIME-INR
INR: 1.4 — ABNORMAL HIGH (ref 0.9–1.2)
Prothrombin Time: 14.1 s — ABNORMAL HIGH (ref 9.1–12.0)

## 2020-11-09 LAB — URINE CULTURE

## 2020-11-11 ENCOUNTER — Other Ambulatory Visit: Payer: Self-pay

## 2020-11-11 DIAGNOSIS — T148XXA Other injury of unspecified body region, initial encounter: Secondary | ICD-10-CM

## 2020-11-12 ENCOUNTER — Other Ambulatory Visit: Payer: Self-pay

## 2020-11-12 ENCOUNTER — Other Ambulatory Visit: Payer: Medicare Other

## 2020-11-12 DIAGNOSIS — T148XXA Other injury of unspecified body region, initial encounter: Secondary | ICD-10-CM

## 2020-11-13 LAB — PT AND PTT
INR: 1.3 — ABNORMAL HIGH (ref 0.9–1.2)
Prothrombin Time: 13.8 s — ABNORMAL HIGH (ref 9.1–12.0)
aPTT: 37 s — ABNORMAL HIGH (ref 24–33)

## 2020-12-07 ENCOUNTER — Other Ambulatory Visit: Payer: Self-pay | Admitting: Family Medicine

## 2020-12-08 ENCOUNTER — Telehealth: Payer: Self-pay

## 2020-12-08 NOTE — Telephone Encounter (Signed)
Dr Melvyn Neth reviewed labs from last visit here and the labs from March 2022. He is not alarmed at the results and doesn't feel it is necessary for pt to come in sooner than scheduled appt. Pt sees her PCP again in July 2022. Pt notified & verbalized understanding.  I called Dr Sedalia Muta office to see if new labs had been done since March. [place] stated no.  Pt req appt per recommendations of her PCP. No answer @1020 -awv

## 2020-12-13 ENCOUNTER — Other Ambulatory Visit: Payer: Self-pay | Admitting: Family Medicine

## 2020-12-14 ENCOUNTER — Telehealth: Payer: Self-pay

## 2020-12-14 NOTE — Progress Notes (Signed)
Chronic Care Management Pharmacy Assistant   Name: Bridget [name]  MRN: [id number] DOB: 1941/05/04  Called patient x3 and was unable to reach her:  Wednesday, 12/15/2020: LVM  Thursday, 12/16/2020: Busy signal  Monday, 12/20/2020: Busy signal   Recent office visits:  No visits since last CPP visit noted  Recent consult visits: No visits since last CPP visit noted  Hospital visits:  No hospital visits since last CPP visit noted  Medications: Outpatient Encounter Medications as of 12/14/2020  Medication Sig  . albuterol (VENTOLIN HFA) 108 (90 Base) MCG/ACT inhaler INHALE 1 TO 2 PUFFS BY MOUTH EVERY 4 HOURS AS NEEDED FOR WHEEZING  . aspirin 81 MG chewable tablet Chew 81 mg by mouth in the morning.   . Blood Glucose Monitoring Suppl ([place]) w/Device KIT Check sugar once daily  . Budeson-Glycopyrrol-Formoterol (BREZTRI AEROSPHERE) 160-9-4.8 MCG/ACT AERO Inhale 2 puffs into the lungs 2 (two) times daily.  . calcium-vitamin D (OSCAL WITH D) 500-200 MG-UNIT TABS tablet Take 1 tablet by mouth daily.  . carvedilol (COREG) 25 MG tablet Take 25 mg by mouth 2 (two) times daily with a meal. Taking 37.5 mg bid  . Cholecalciferol (VITAMIN D-3) 1000 UNITS CAPS Take 1,000 Units by mouth daily.  . fluvastatin (LESCOL) 20 MG capsule Take 1 capsule (20 mg total) by mouth at bedtime.  . furosemide (LASIX) 20 MG tablet Take 1 tablet (20 mg total) by mouth daily.  . Glucagon, rDNA, (GLUCAGON EMERGENCY) 1 MG KIT Inject 1 kit as directed once for 1 dose. If sugars < 70, if you cannot increase with oral glucose.  Marland Kitchen glucose blood (ONETOUCH VERIO) test strip 1 each by Other route as needed for other. Use as instructed to check blood sugar once daily  DX CODE E11.9  . losartan (COZAAR) 25 MG tablet Take 25 mg by mouth daily.  Marland Kitchen lovastatin (MEVACOR) 40 MG tablet Take 1 tablet (40 mg total) by mouth at bedtime.  . Magnesium 250 MG TABS Take 250 mg by mouth daily.  . metFORMIN  (GLUCOPHAGE) 500 MG tablet TAKE 1 TABLET BY MOUTH TWICE DAILY WITH MORNING MEAL AND WITH EVENING MEAL  . Multiple Vitamins-Minerals (CENTRUM SILVER ULTRA WOMENS PO) Take by mouth daily.  . nitrofurantoin, macrocrystal-monohydrate, (MACROBID) 100 MG capsule Take 1 capsule (100 mg total) by mouth 2 (two) times daily.  . nitroGLYCERIN (NITROSTAT) 0.4 MG SL tablet Place 0.4 mg under the tongue every 5 (five) minutes as needed for chest pain.  [name] Delica Lancets 32K MISC USE 1  TO CHECK GLUCOSE ONCE DAILY *APPOINTMENT  REQUIRED  FOR  FUTURE  REFILLS  . polyethylene glycol (MIRALAX / GLYCOLAX) packet Take 17 g by mouth daily as needed.  . Rivaroxaban (XARELTO) 15 MG TABS tablet Take 1 tablet (15 mg total) by mouth daily.  Marland Kitchen venlafaxine XR (EFFEXOR-XR) 75 MG 24 hr capsule Take 1 capsule by mouth once daily   No facility-administered encounter medications on file as of 12/14/2020.   Recent Relevant Labs: Lab Results  Component Value Date/Time   HGBA1C 6.7 (H) 10/22/2020 09:01 AM   HGBA1C 6.0 (A) 09/24/2020 09:30 AM   HGBA1C 5.9 (A) 03/23/2020 08:35 AM   HGBA1C 6.3 (H) 11/20/2019 09:31 AM   MICROALBUR <0.7 03/23/2020 10:49 AM   MICROALBUR 1.8 01/02/2019 09:32 AM    Kidney Function Lab Results  Component Value Date/Time   CREATININE 0.99 10/22/2020 09:01 AM   CREATININE 1.0 10/13/2020 12:00 AM   CREATININE 0.92  09/08/2020 02:42 PM   GFR 32.91 (L) 03/23/2020 08:52 AM   GFRNONAA 60 09/08/2020 02:42 PM   GFRAA 69 09/08/2020 02:42 PM    . Current antihyperglycemic regimen:   Metformin 500 mg. Tablet: 1 tablet po bid with morning meal and with                    evening meal.    Adherence Review: Is the patient currently on a STATIN medication? Patient is currently on  Fluvastatin and Lovastatin   Is the patient currently on ACE/ARB medication? Losartan    Does the patient have >5 day gap between last estimated fill dates? Patient does not have >5 day gap between last estimated fill  dates. CPP to review   Star Rating Drugs:  Medication:  Last Fill: Day Supply Losartan  12/07/2020 90DS Metformin   10/26/2020 60DS Fluvastatin  10/28/2020 90DS Lovastatin  11/01/2020 90DS    Wednesday, 12/15/2020 @ 10:30 am/ LVM Thursday, 12/16/2020 @ 10:38 am/ Busy signal  Monday, 12/20/2020: @ 1:04 pm/  Busy signal   Marcine Matar, [place] Clinical Pharmacist Assistant

## 2021-01-12 ENCOUNTER — Other Ambulatory Visit: Payer: Self-pay | Admitting: Family Medicine

## 2021-01-12 LAB — HM DIABETES EYE EXAM

## 2021-01-13 ENCOUNTER — Ambulatory Visit (INDEPENDENT_AMBULATORY_CARE_PROVIDER_SITE_OTHER): Payer: Medicare Other

## 2021-01-13 ENCOUNTER — Other Ambulatory Visit: Payer: Self-pay

## 2021-01-13 VITALS — BP 118/60 | HR 88 | Temp 98.0°F | Resp 16 | Ht 62.0 in | Wt 109.6 lb

## 2021-01-13 DIAGNOSIS — Z0000 Encounter for general adult medical examination without abnormal findings: Secondary | ICD-10-CM

## 2021-01-13 NOTE — Progress Notes (Signed)
Subjective:   Kaleia Longhi is a 80 y.o. female who presents for Medicare Annual (Subsequent) preventive examination.  This wellness visit is conducted by a nurse.  The patient's medications were reviewed and reconciled since the patient's last visit.  History details were provided by the patient.  The history appears to be reliable.    Patient's last AWV was one year ago.   Medical History: Patient history and Family history was reviewed  Medications, Allergies, and preventative health maintenance was reviewed and updated.   Review of Systems    Review of Systems  Constitutional: Negative.   HENT: Positive for hearing loss, rhinorrhea and sneezing.   Eyes: Negative.   Respiratory: Negative.  Negative for cough and shortness of breath.   Cardiovascular: Negative.  Negative for chest pain and palpitations.  Musculoskeletal: Positive for arthralgias.  Neurological: Negative.  Negative for dizziness, numbness and headaches.  Psychiatric/Behavioral: Negative.  Negative for confusion, dysphoric mood, hallucinations and suicidal ideas.   Cardiac Risk Factors include: advanced age (>69mn, >>65women);diabetes mellitus;dyslipidemia;hypertension     Objective:    Today's Vitals   01/13/21 1052  BP: 118/60  Pulse: 88  Resp: 16  Temp: 98 F (36.7 C)  SpO2: 98%  Weight: 109 lb 9.6 oz (49.7 kg)  Height: 5' 2"  (1.575 m)  PainSc: 0-No pain   Body mass index is 20.05 kg/m.  Advanced Directives 01/13/2021 10/13/2020 01/13/2020  Does Patient Have a Medical Advance Directive? No No Yes  Type of Advance Directive - -Public librarianLiving will  Does patient want to make changes to medical advance directive? - - No - Patient declined  Copy of [place]? - - No - copy requested  Would patient like information on creating a medical advance directive? Yes (MAU/Ambulatory/Procedural Areas - Information given) - -    Current Medications  (verified) Outpatient Encounter Medications as of 01/13/2021  Medication Sig  . albuterol (VENTOLIN HFA) 108 (90 Base) MCG/ACT inhaler INHALE 1 TO 2 PUFFS BY MOUTH EVERY 4 HOURS AS NEEDED FOR WHEEZING  . aspirin 81 MG chewable tablet Chew 81 mg by mouth in the morning.   . Blood Glucose Monitoring Suppl ([place] w/Device KIT Check sugar once daily  . Budeson-Glycopyrrol-Formoterol (BREZTRI AEROSPHERE) 160-9-4.8 MCG/ACT AERO Inhale 2 puffs into the lungs 2 (two) times daily.  . calcium-vitamin D (OSCAL WITH D) 500-200 MG-UNIT TABS tablet Take 1 tablet by mouth daily.  . carvedilol (COREG) 25 MG tablet Take 25 mg by mouth daily.  . Cholecalciferol (VITAMIN D-3) 1000 UNITS CAPS Take 1,000 Units by mouth daily.  . fluvastatin (LESCOL) 20 MG capsule Take 1 capsule (20 mg total) by mouth at bedtime.  . furosemide (LASIX) 20 MG tablet Take 1 tablet (20 mg total) by mouth daily.  .Marland Kitchenglucose blood (ONETOUCH VERIO) test strip 1 each by Other route as needed for other. Use as instructed to check blood sugar once daily  DX CODE E11.9  . losartan (COZAAR) 25 MG tablet Take 25 mg by mouth daily.  .Marland Kitchenlovastatin (MEVACOR) 40 MG tablet Take 1 tablet (40 mg total) by mouth at bedtime.  . Magnesium 250 MG TABS Take 250 mg by mouth daily.  . metFORMIN (GLUCOPHAGE) 500 MG tablet TAKE 1 TABLET BY MOUTH TWICE DAILY WITH MORNING MEAL AND WITH EVENING MEAL  . Multiple Vitamins-Minerals (CENTRUM SILVER ULTRA WOMENS PO) Take by mouth daily.  . nitrofurantoin, macrocrystal-monohydrate, (MACROBID) 100 MG capsule Take  1 capsule (100 mg total) by mouth 2 (two) times daily.  . nitroGLYCERIN (NITROSTAT) 0.4 MG SL tablet Place 0.4 mg under the tongue every 5 (five) minutes as needed for chest pain.  [name] Delica Lancets 97L MISC USE 1  TO CHECK GLUCOSE ONCE DAILY *APPOINTMENT  REQUIRED  FOR  FUTURE  REFILLS  . polyethylene glycol (MIRALAX / GLYCOLAX) packet Take 17 g by mouth daily as needed.  . Rivaroxaban  (XARELTO) 15 MG TABS tablet Take 1 tablet (15 mg total) by mouth daily.  Marland Kitchen venlafaxine XR (EFFEXOR-XR) 75 MG 24 hr capsule Take 1 capsule by mouth once daily  . Glucagon, rDNA, (GLUCAGON EMERGENCY) 1 MG KIT Inject 1 kit as directed once for 1 dose. If sugars < 70, if you cannot increase with oral glucose.   No facility-administered encounter medications on file as of 01/13/2021.    Allergies (verified) Atorvastatin, Budesonide, Gabapentin, Metoclopramide, Rosuvastatin, Simvastatin, and Sulfamethoxazole-trimethoprim   History: Past Medical History:  Diagnosis Date  . Asthma   . Atrioventricular block   . Diabetes mellitus without complication ([place])   . Fibromyalgia   . Heart disease   . Hypertension   . Insomnia   . Kidney disease   . Kidney stones 01/12/1983  . Major depression   . Major depressive disorder, recurrent, mild ([place])   . Mixed hyperlipidemia   . Osteoporosis 01/27/2019  . Other amnesia   . 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 Statin myopathy   . Thyroid disease    Past Surgical History:  Procedure Laterality Date  . ABDOMINAL HYSTERECTOMY  03/05/1998   pt does not think this was a total hysterectomy  . APPENDECTOMY  12/31/1988  . BREAST LUMPECTOMY Left 05/22/1996  . pacemaker  09/27/2016  . PACEMAKER REVISION  09/27/2020   PACEMAKER UPGRADE DUAL CHAMBER TO BIV   Family History  Problem Relation Age of Onset  . High blood pressure Mother   . Diabetes Mother        no medication needed  . Kidney disease Mother   . Pneumonia Mother   . High blood pressure Father   . Cancer Maternal Grandmother        Leukemia  . Cancer Other        Ovarian   Social History   Socioeconomic History  . Marital status: Married    Spouse name: Darryl  . Number of children: 2  . Years of education: Not on file  . Highest education level: 9th grade  Occupational History  . Occupation: Retired  Tobacco Use  . Smoking status: Never Smoker  .  Smokeless tobacco: Never Used  Vaping Use  . Vaping Use: Never used  Substance and Sexual Activity  . Alcohol use: Never  . Drug use: Never  . Sexual activity: Not on file  Other Topics Concern  . Not on file  Social History Narrative   Lives at home with her husband   Right handed   Caffeine: mostly tea, 0-2 cups a day   Social Determinants of Health   Financial Resource Strain: Low Risk   . Difficulty of Paying Living Expenses: Not hard at all  Food Insecurity: No Food Insecurity  . Worried About Charity fundraiser in the Last Year: Never true  . Ran Out of Food in the Last Year: Never true  Transportation Needs: No Transportation Needs  . Lack of Transportation (Medical): No  . Lack of Transportation (Non-Medical): No  Physical  Activity: Not on file  Stress: Not on file  Social Connections: Not on file   Tobacco Counseling Counseling given: Not Answered Never Smoker  Clinical Intake: Pre-visit preparation completed: Yes Pain : No/denies pain Pain Score: 0-No pain   BMI - recorded: 20.05 Nutritional Status: BMI of 19-24  Normal Nutritional Risks: None Diabetes: Yes CBG done?: No Did pt. bring in CBG monitor from home?: No How often do you need to have someone help you when you read instructions, pamphlets, or other written materials from your doctor or pharmacy?: 1 - Never Interpreter Needed?: No   Activities of Daily Living In your present state of health, do you have any difficulty performing the following activities: 01/13/2021  Hearing? Y  Comment HOH  Vision? N  Difficulty concentrating or making decisions? N  Walking or climbing stairs? N  Dressing or bathing? N  Doing errands, shopping? N  Preparing Food and eating ? N  Using the Toilet? N  In the past six months, have you accidently leaked urine? N  Do you have problems with loss of bowel control? N  Managing your Medications? N  Managing your Finances? N  Housekeeping or managing your  Housekeeping? N  Some recent data might be hidden    Patient Care Team: [name], MD as PCP - General (Family Medicine) Elayne Snare, MD as Consulting Physician (Endocrinology) Burnice Logan, [facility] as Pharmacist (Pharmacist) Marice Potter, MD as Consulting Physician (Oncology) Flossie Buffy., MD as Referring Physician (Cardiology) Jackquline Denmark, MD as Consulting Physician (Gastroenterology) [place], [facility] (Ophthalmology)    Assessment:   This is a routine wellness examination for Chrisette.  Dietary issues and exercise activities discussed: Current Exercise Habits: The patient does not participate in regular exercise at present, Exercise limited by: None identified  Depression Screen PHQ 2/9 Scores 01/13/2021 10/22/2020 06/25/2020 06/06/2020 01/13/2020 12/17/2019  PHQ - 2 Score 0 1 0 0 1 1  PHQ- 9 Score - 5 - - - -    Fall Risk Fall Risk  01/13/2021 10/22/2020 01/13/2020 11/20/2019 05/28/2018  Falls in the past year? 0 0 1 1 Yes  Number falls in past yr: 0 0 1 0 2 or more  Comment - - number has decreased since getting pacemaker - -  Injury with Fall? 0 0 1 1 Yes  Comment - - - - "sometimes"  Risk Factor Category  - - - - High Fall Risk  Risk for fall due to : No Fall Risks No Fall Risks History of fall(s);Impaired balance/gait - -  Follow up Falls evaluation completed;Falls prevention discussed Falls evaluation completed Falls evaluation completed;Education provided;Falls prevention discussed Falls evaluation completed;Falls prevention discussed Education provided  Comment - - - Previously seen by physical therapy -    FALL RISK PREVENTION PERTAINING TO THE HOME:  Any stairs in or around the home? Yes  If so, are there any without handrails? No  Home free of loose throw rugs in walkways, pet beds, electrical cords, etc? Yes  Adequate lighting in your home to reduce risk of falls? Yes   ASSISTIVE DEVICES UTILIZED TO PREVENT FALLS:  Life alert? No  Use of a cane, walker  or w/c? No  Grab bars in the bathroom? Yes  Shower chair or bench in shower? Yes  Elevated toilet seat or a handicapped toilet? No  Gait steady and fast without use of assistive device  Cognitive Function: MMSE - Mini Mental State Exam 10/22/2020  Orientation to time 4  Orientation to Place 5  Registration 3  Attention/ Calculation 5  Recall 3  Language- name 2 objects 2  Language- repeat 1  Language- follow 3 step command 3  Language- read & follow direction 1  Write a sentence 1  Copy design 0  Total score 28     6CIT Screen 01/13/2021 01/13/2020  What Year? 0 points 0 points  What month? 0 points 0 points  What time? 0 points 0 points  Count back from 20 0 points 0 points  Months in reverse 2 points 2 points  Repeat phrase 2 points 2 points  Total Score 4 4    Immunizations Immunization History  Administered Date(s) Administered  . Fluad Quad(high Dose 65+) 05/20/2020  . Influenza, High Dose Seasonal PF 06/05/2017  . Influenza,trivalent, recombinat, inj, PF 05/14/2012  . Influenza-Unspecified 07/02/2014, 04/13/2015, 04/24/2016, 06/14/2018  . PFIZER(Purple Top)SARS-COV-2 Vaccination 08/29/2019, 09/19/2019, 05/14/2020, 01/03/2021  . Pneumococcal Conjugate-13 04/27/2014  . Pneumococcal Polysaccharide-23 07/14/2010, 07/14/2010, 12/02/2015  . Tdap 11/01/2017    TDAP status: Up to date  Flu Vaccine status: Up to date  Pneumococcal vaccine status: Up to date  Covid-19 vaccine status: Completed vaccines  Qualifies for Shingles Vaccine? Yes   Zostavax completed Yes   Shingrix Completed?: No.    Education has been provided regarding the importance of this vaccine. Patient has been advised to call insurance company to determine out of pocket expense if they have not yet received this vaccine. Advised may also receive vaccine at local pharmacy or Health Dept. Verbalized acceptance and understanding.  Screening Tests Health Maintenance  Topic Date Due  . Hepatitis C  Screening  Never done  . Zoster Vaccines- Shingrix (1 of 2) Never done  . OPHTHALMOLOGY EXAM  01/13/2021  . DEXA SCAN  01/26/2021  . INFLUENZA VACCINE  03/14/2021  . HEMOGLOBIN A1C  04/24/2021  . FOOT EXAM  10/22/2021  . TETANUS/TDAP  11/02/2027  . COVID-19 Vaccine  Completed  . PNA vac Low Risk Adult  Completed  . HPV VACCINES  Aged Out    Health Maintenance  Health Maintenance Due  Topic Date Due  . Hepatitis C Screening  Never done  . Zoster Vaccines- Shingrix (1 of 2) Never done  . OPHTHALMOLOGY EXAM  01/13/2021    Colorectal cancer screening: No longer required.   Mammogram status: Completed 10/2019. Repeat every year  Bone Density status: Completed 01/2019.  Lung Cancer Screening: (Low Dose CT Chest recommended if Age 57-80 years, 30 pack-year currently smoking OR have quit w/in 15years.) does not qualify.   Additional Screening:  Vision Screening: Recommended annual ophthalmology exams for early detection of glaucoma and other disorders of the eye. Is the patient up to date with their annual eye exam?  Yes  Who is the provider or what is the name of the office in which the patient attends annual eye exams? [place], [place]  Dental Screening: Recommended annual dental exams for proper oral hygiene    Plan:     1- Diabetic eye exam - patient has an appointment next week 2- Mammogram - Patient will schedule in [place] or call back to schedule on the mobile unit 3- Advance Directive - Patient doesn't think she has this, printed information given so she can discuss with her husband.  She will contact me with any questions completing the forms. 4- Continue taking Calcium + D for bone strength.  Patient lives in a split level home.  She is encouraged to increase exercise intensity  as tolerated.  She is currently walking in her home some days.  She has hand rails at the steps and has shower bars.  I have personally reviewed and noted the following in the patient's  chart:   . Medical and social history . Use of alcohol, tobacco or illicit drugs  . Current medications and supplements including opioid prescriptions.  . Functional ability and status . Nutritional status . Physical activity . Advanced directives . List of other physicians . Hospitalizations, surgeries, and ER visits in previous 12 months . Vitals . Screenings to include cognitive, depression, and falls . Referrals and appointments  In addition, I have reviewed and discussed with patient certain preventive protocols, quality metrics, and best practice recommendations. A written personalized care plan for preventive services as well as general preventive health recommendations were provided to patient.     Erie Noe, LPN   08/19/1094

## 2021-01-13 NOTE — Patient Instructions (Signed)
 Fall Prevention in the Home, Adult Falls can cause injuries and can happen to people of all ages. There are many things you can do to make your home safe and to help prevent falls. Ask for help when making these changes. What actions can I take to prevent falls? General Instructions  Use good lighting in all rooms. Replace any light bulbs that burn out.  Turn on the lights in dark areas. Use night-lights.  Keep items that you use often in easy-to-reach places. Lower the shelves around your home if needed.  Set up your furniture so you have a clear path. Avoid moving your furniture around.  Do not have throw rugs or other things on the floor that can make you trip.  Avoid walking on wet floors.  If any of your floors are uneven, fix them.  Add color or contrast paint or tape to clearly mark and help you see: ? Grab bars or handrails. ? First and last steps of staircases. ? Where the edge of each step is.  If you use a stepladder: ? Make sure that it is fully opened. Do not climb a closed stepladder. ? Make sure the sides of the stepladder are locked in place. ? Ask someone to hold the stepladder while you use it.  Know where your pets are when moving through your home. What can I do in the bathroom?  Keep the floor dry. Clean up any water on the floor right away.  Remove soap buildup in the tub or shower.  Use nonskid mats or decals on the floor of the tub or shower.  Attach bath mats securely with double-sided, nonslip rug tape.  If you need to sit down in the shower, use a plastic, nonslip stool.  Install grab bars by the toilet and in the tub and shower. Do not use towel bars as grab bars.      What can I do in the bedroom?  Make sure that you have a light by your bed that is easy to reach.  Do not use any sheets or blankets for your bed that hang to the floor.  Have a firm chair with side arms that you can use for support when you get dressed. What can I do  in the kitchen?  Clean up any spills right away.  If you need to reach something above you, use a step stool with a grab bar.  Keep electrical cords out of the way.  Do not use floor polish or wax that makes floors slippery. What can I do with my stairs?  Do not leave any items on the stairs.  Make sure that you have a light switch at the top and the bottom of the stairs.  Make sure that there are handrails on both sides of the stairs. Fix handrails that are broken or loose.  Install nonslip stair treads on all your stairs.  Avoid having throw rugs at the top or bottom of the stairs.  Choose a carpet that does not hide the edge of the steps on the stairs.  Check carpeting to make sure that it is firmly attached to the stairs. Fix carpet that is loose or worn. What can I do on the outside of my home?  Use bright outdoor lighting.  Fix the edges of walkways and driveways and fix any cracks.  Remove anything that might make you trip as you walk through a door, such as a raised step or threshold.  Trim   any bushes or trees on paths to your home.  Check to see if handrails are loose or broken and that both sides of all steps have handrails.  Install guardrails along the edges of any raised decks and porches.  Clear paths of anything that can make you trip, such as tools or rocks.  Have leaves, snow, or ice cleared regularly.  Use sand or salt on paths during winter.  Clean up any spills in your garage right away. This includes grease or oil spills. What other actions can I take?  Wear shoes that: ? Have a low heel. Do not wear high heels. ? Have rubber bottoms. ? Feel good on your feet and fit well. ? Are closed at the toe. Do not wear open-toe sandals.  Use tools that help you move around if needed. These include: ? Canes. ? Walkers. ? Scooters. ? Crutches.  Review your medicines with your doctor. Some medicines can make you feel dizzy. This can increase your  chance of falling. Ask your doctor what else you can do to help prevent falls. Where to find more information  Centers for Disease Control and Prevention, [place]: www.cdc.gov  National Institute on Aging: www.nia.nih.gov Contact a doctor if:  You are afraid of falling at home.  You feel weak, drowsy, or dizzy at home.  You fall at home. Summary  There are many simple things that you can do to make your home safe and to help prevent falls.  Ways to make your home safe include removing things that can make you trip and installing grab bars in the bathroom.  Ask for help when making these changes in your home. This information is not intended to replace advice given to you by your health care provider. Make sure you discuss any questions you have with your health care provider. Document Revised: 03/03/2020 Document Reviewed: 03/03/2020 Elsevier Patient Education  2021 Elsevier Inc.   Health Maintenance, Female Adopting a healthy lifestyle and getting preventive care are important in promoting health and wellness. Ask your health care provider about:  The right schedule for you to have regular tests and exams.  Things you can do on your own to prevent diseases and keep yourself healthy. What should I know about diet, weight, and exercise? Eat a healthy diet  Eat a diet that includes plenty of vegetables, fruits, low-fat dairy products, and lean protein.  Do not eat a lot of foods that are high in solid fats, added sugars, or sodium.   Maintain a healthy weight Body mass index (BMI) is used to identify weight problems. It estimates body fat based on height and weight. Your health care provider can help determine your BMI and help you achieve or maintain a healthy weight. Get regular exercise Get regular exercise. This is one of the most important things you can do for your health. Most adults should:  Exercise for at least 150 minutes each week. The exercise should increase  your heart rate and make you sweat (moderate-intensity exercise).  Do strengthening exercises at least twice a week. This is in addition to the moderate-intensity exercise.  Spend less time sitting. Even light physical activity can be beneficial. Watch cholesterol and blood lipids Have your blood tested for lipids and cholesterol at 78 years of age, then have this test every 5 years. Have your cholesterol levels checked more often if:  Your lipid or cholesterol levels are high.  You are older than 78 years of age.  You are at   high risk for heart disease. What should I know about cancer screening? Depending on your health history and family history, you may need to have cancer screening at various ages. This may include screening for:  Breast cancer.  Cervical cancer.  Colorectal cancer.  Skin cancer.  Lung cancer. What should I know about heart disease, diabetes, and high blood pressure? Blood pressure and heart disease  High blood pressure causes heart disease and increases the risk of stroke. This is more likely to develop in people who have high blood pressure readings, are of African descent, or are overweight.  Have your blood pressure checked: ? Every 3-5 years if you are 18-39 years of age. ? Every year if you are 40 years old or older. Diabetes Have regular diabetes screenings. This checks your fasting blood sugar level. Have the screening done:  Once every three years after age 40 if you are at a normal weight and have a low risk for diabetes.  More often and at a younger age if you are overweight or have a high risk for diabetes. What should I know about preventing infection? Hepatitis B If you have a higher risk for hepatitis B, you should be screened for this virus. Talk with your health care provider to find out if you are at risk for hepatitis B infection. Hepatitis C Testing is recommended for:  Everyone born from 1945 through 1965.  Anyone with known  risk factors for hepatitis C. Sexually transmitted infections (STIs)  Get screened for STIs, including gonorrhea and chlamydia, if: ? You are sexually active and are younger than 78 years of age. ? You are older than 78 years of age and your health care provider tells you that you are at risk for this type of infection. ? Your sexual activity has changed since you were last screened, and you are at increased risk for chlamydia or gonorrhea. Ask your health care provider if you are at risk.  Ask your health care provider about whether you are at high risk for HIV. Your health care provider may recommend a prescription medicine to help prevent HIV infection. If you choose to take medicine to prevent HIV, you should first get tested for HIV. You should then be tested every 3 months for as long as you are taking the medicine. Pregnancy  If you are about to stop having your period (premenopausal) and you may become pregnant, seek counseling before you get pregnant.  Take 400 to 800 micrograms (mcg) of folic acid every day if you become pregnant.  Ask for birth control (contraception) if you want to prevent pregnancy. Osteoporosis and menopause Osteoporosis is a disease in which the bones lose minerals and strength with aging. This can result in bone fractures. If you are 65 years old or older, or if you are at risk for osteoporosis and fractures, ask your health care provider if you should:  Be screened for bone loss.  Take a calcium or vitamin D supplement to lower your risk of fractures.  Be given hormone replacement therapy (HRT) to treat symptoms of menopause. Follow these instructions at home: Lifestyle  Do not use any products that contain nicotine or tobacco, such as cigarettes, e-cigarettes, and chewing tobacco. If you need help quitting, ask your health care provider.  Do not use street drugs.  Do not share needles.  Ask your health care provider for help if you need support or  information about quitting drugs. Alcohol use  Do not drink alcohol   if: ? Your health care provider tells you not to drink. ? You are pregnant, may be pregnant, or are planning to become pregnant.  If you drink alcohol: ? Limit how much you use to 0-1 drink a day. ? Limit intake if you are breastfeeding.  Be aware of how much alcohol is in your drink. In the [place] one drink equals one 12 oz bottle of beer (355 mL), one 5 oz glass of wine (148 mL), or one 1 oz glass of hard liquor (44 mL). General instructions  Schedule regular health, dental, and eye exams.  Stay current with your vaccines.  Tell your health care provider if: ? You often feel depressed. ? You have ever been abused or do not feel safe at home. Summary  Adopting a healthy lifestyle and getting preventive care are important in promoting health and wellness.  Follow your health care provider's instructions about healthy diet, exercising, and getting tested or screened for diseases.  Follow your health care provider's instructions on monitoring your cholesterol and blood pressure. This information is not intended to replace advice given to you by your health care provider. Make sure you discuss any questions you have with your health care provider. Document Revised: 07/24/2018 Document Reviewed: 07/24/2018 Elsevier Patient Education  2021 Elsevier Inc.  

## 2021-01-18 ENCOUNTER — Other Ambulatory Visit: Payer: Self-pay | Admitting: Endocrinology

## 2021-01-18 DIAGNOSIS — E119 Type 2 diabetes mellitus without complications: Secondary | ICD-10-CM

## 2021-01-19 DIAGNOSIS — E119 Type 2 diabetes mellitus without complications: Secondary | ICD-10-CM | POA: Diagnosis not present

## 2021-01-31 ENCOUNTER — Other Ambulatory Visit: Payer: Self-pay

## 2021-01-31 ENCOUNTER — Ambulatory Visit (INDEPENDENT_AMBULATORY_CARE_PROVIDER_SITE_OTHER): Payer: Medicare Other

## 2021-01-31 DIAGNOSIS — E782 Mixed hyperlipidemia: Secondary | ICD-10-CM

## 2021-01-31 DIAGNOSIS — G720 Drug-induced myopathy: Secondary | ICD-10-CM

## 2021-01-31 NOTE — Telephone Encounter (Signed)
Patient called with questions about her statin medication.  She has most recently been taking fluvastatin  20 mg.  She was confused about increasing the lovastatin.  Marianne Sofia, PA advised that she continue the fluvastatin at the current dose since she does have some complaints of myalgias.  She will call us back if symptoms worsen.

## 2021-02-10 NOTE — Progress Notes (Signed)
  Chronic Care Management   Note  02/10/2021 Name: Bridget [name] MRN: [id number] DOB: September 23, 1940  Pharmacist assisted nurse with determining correct statin for patient between fluvastatin (Lescol) and lovastatin. Fluvastatin is what was originally prescribed and documented by endocrinology as well. Pharmacist discussed with nursing. Patient informed by nursing to continue fluvastatin.   Juliane Lack, PharmD, [facility] Clinical Pharmacist [facility] [phone] (office) [phone] (mobile)

## 2021-02-21 ENCOUNTER — Other Ambulatory Visit: Payer: Self-pay

## 2021-02-21 ENCOUNTER — Ambulatory Visit (INDEPENDENT_AMBULATORY_CARE_PROVIDER_SITE_OTHER): Payer: Medicare Other | Admitting: Family Medicine

## 2021-02-21 VITALS — BP 110/64 | HR 67 | Temp 96.1°F | Ht 62.0 in | Wt 108.0 lb

## 2021-02-21 DIAGNOSIS — R233 Spontaneous ecchymoses: Secondary | ICD-10-CM | POA: Diagnosis not present

## 2021-02-21 DIAGNOSIS — F330 Major depressive disorder, recurrent, mild: Secondary | ICD-10-CM

## 2021-02-21 DIAGNOSIS — E1121 Type 2 diabetes mellitus with diabetic nephropathy: Secondary | ICD-10-CM

## 2021-02-21 DIAGNOSIS — E782 Mixed hyperlipidemia: Secondary | ICD-10-CM

## 2021-02-21 DIAGNOSIS — Z780 Asymptomatic menopausal state: Secondary | ICD-10-CM | POA: Diagnosis not present

## 2021-02-21 DIAGNOSIS — D6869 Other thrombophilia: Secondary | ICD-10-CM | POA: Diagnosis not present

## 2021-02-21 DIAGNOSIS — I10 Essential (primary) hypertension: Secondary | ICD-10-CM | POA: Diagnosis not present

## 2021-02-21 MED ORDER — VENLAFAXINE HCL ER 75 MG PO CP24: 75.0000 mg | ORAL_CAPSULE | Freq: Every day | ORAL | 0 refills | Status: DC

## 2021-02-21 NOTE — Progress Notes (Signed)
Subjective:  Patient ID: Bridget [name], female    DOB: 1941/06/20  Age: 80 y.o. MRN: [id number]  Chief Complaint  Patient presents with   Diabetes   Hypertension    HPI Mixed hyperlipidemia Takng lescol 20 mg daily, maintains a healthy diet. I had wanted her to change to lovastatin 40 mg once daily, but I did not communicate this to patient well as she has continued the lescol.  Diabetic glomerulopathy (HCC) Checks daily ranges from 95-167, checks feet daily, taking metformin 500 mg BID. Sees endocrinology.   Essential hypertension with cardiomyopathy and CHF.  Exercises when possible taking losartan 25 mg, coreg 25 mg BID, Pt had recent pacemaker upgrade in 09/2020. Denies chst pain.   Major depressive disorder, recurrent episode, mild (HCC) Venlafaxine 75 mg daily. Depression is fairly well controlled.   Pt has a lot of bruising from xarelto. PT/INR/PTT were all slightly elevated and I discussed with hematology and they felt it was an effect of xarelto.   Current Outpatient Medications on File Prior to Visit  Medication Sig Dispense Refill   albuterol (VENTOLIN HFA) 108 (90 Base) MCG/ACT inhaler INHALE 1 TO 2 PUFFS BY MOUTH EVERY 4 HOURS AS NEEDED FOR WHEEZING 9 g 0   aspirin 81 MG chewable tablet Chew 81 mg by mouth in the morning.      Blood Glucose Monitoring Suppl (ONETOUCH VERIO FLEX SYSTEM) w/Device KIT Check sugar once daily 1 kit 0   Budeson-Glycopyrrol-Formoterol (BREZTRI AEROSPHERE) 160-9-4.8 MCG/ACT AERO Inhale 2 puffs into the lungs 2 (two) times daily. 10.7 g 5   calcium-vitamin D (OSCAL WITH D) 500-200 MG-UNIT TABS tablet Take 1 tablet by mouth daily.     carvedilol (COREG) 25 MG tablet Take 25 mg by mouth daily.     Cholecalciferol (VITAMIN D-3) 1000 UNITS CAPS Take 1,000 Units by mouth daily.     fluvastatin (LESCOL) 20 MG capsule Take 1 capsule (20 mg total) by mouth at bedtime. 30 capsule 2   furosemide (LASIX) 20 MG tablet Take 1 tablet (20 mg total) by  mouth daily. 30 tablet 0   Glucagon, rDNA, (GLUCAGON EMERGENCY) 1 MG KIT Inject 1 kit as directed once for 1 dose. If sugars < 70, if you cannot increase with oral glucose. 1 kit 1   glucose blood (ONETOUCH VERIO) test strip 1 each by Other route as needed for other. Use as instructed to check blood sugar once daily  DX CODE E11.9 100 each 1   losartan (COZAAR) 25 MG tablet Take 25 mg by mouth daily.     Magnesium 250 MG TABS Take 250 mg by mouth daily.     metFORMIN (GLUCOPHAGE) 500 MG tablet TAKE 1 TABLET BY MOUTH TWICE DAILY WITH MORNING MEAL AND WITH EVENING MEAL 180 tablet 0   Multiple Vitamins-Minerals (CENTRUM SILVER ULTRA WOMENS PO) Take by mouth daily.     nitrofurantoin, macrocrystal-monohydrate, (MACROBID) 100 MG capsule Take 1 capsule (100 mg total) by mouth 2 (two) times daily. 14 capsule 0   nitroGLYCERIN (NITROSTAT) 0.4 MG SL tablet Place 0.4 mg under the tongue every 5 (five) minutes as needed for chest pain.     OneTouch Delica Lancets 97F MISC USE 1  TO CHECK GLUCOSE ONCE DAILY *APPOINTMENT  REQUIRED  FOR  FUTURE  REFILLS 100 each 3   polyethylene glycol (MIRALAX / GLYCOLAX) packet Take 17 g by mouth daily as needed.     venlafaxine XR (EFFEXOR-XR) 75 MG 24 hr capsule Take 1 capsule  by mouth once daily 90 capsule 0   XARELTO 15 MG TABS tablet Take 1 tablet by mouth once daily 90 tablet 0   No current facility-administered medications on file prior to visit.   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[place])    Statin myopathy    Thyroid disease    Past Surgical History:  Procedure Laterality Date    ABDOMINAL HYSTERECTOMY  03/05/1998   pt does not think this was a total hysterectomy   APPENDECTOMY  12/31/1988   BREAST LUMPECTOMY Left 05/22/1996   pacemaker  09/27/2016   PACEMAKER REVISION  09/27/2020   PACEMAKER UPGRADE DUAL CHAMBER TO BIV    Family History  Problem Relation Age of Onset   High blood pressure Mother    Diabetes Mother        no medication needed   Kidney disease Mother    Pneumonia Mother    High blood pressure Father    Cancer Maternal Grandmother        Leukemia   Cancer Other        Ovarian   Social History   Socioeconomic History   Marital status: Married    Spouse name: Darryl   Number of children: 2   Years of education: Not on file   Highest education level: 9th grade  Occupational History   Occupation: Retired  Tobacco Use   Smoking status: Never   Smokeless tobacco: Never  Vaping Use   Vaping Use: Never used  Substance and Sexual Activity   Alcohol use: Never   Drug use: Never   Sexual activity: Not on file  Other Topics Concern   Not on file  Social History Narrative   Lives at home with her husband   Right handed   Caffeine: mostly tea, 0-2 cups a day   Social Determinants of Health   Financial Resource Strain: Low Risk    Difficulty of Paying Living Expenses: Not hard at all  Food Insecurity: No Food Insecurity   Worried About Charity fundraiser in the Last Year: Never true   Arboriculturist in the Last Year: Never true  Transportation Needs: No Transportation Needs   Lack of Transportation (Medical): No   Lack of Transportation (Non-Medical): No  Physical Activity: Not on file  Stress: Not on file  Social Connections: Not on file    Review of Systems  Constitutional:  Negative for chills, fatigue and fever.  HENT:  Negative for congestion, ear pain, rhinorrhea and sore throat.   Respiratory:  Positive for cough and shortness of breath.   Cardiovascular:  Negative for chest pain.  Gastrointestinal:  Negative for  abdominal pain, constipation, diarrhea, nausea and vomiting.  Genitourinary:  Negative for dysuria and urgency.  Musculoskeletal:  Positive for back pain. Negative for arthralgias and myalgias.  Neurological:  Negative for dizziness, weakness, light-headedness and headaches.  Psychiatric/Behavioral:  Positive for dysphoric mood. The patient is not nervous/anxious.     Objective:  BP 110/64   Pulse 67  Temp (!) 96.1 F (35.6 C)   Ht 5' 2"  (1.575 m)   Wt 108 lb (49 kg)   SpO2 93%   BMI 19.75 kg/m   BP/Weight 02/21/2021 01/13/2021 04/02/6014  Systolic BP 615 379 -  Diastolic BP 64 60 -  Wt. (Lbs) 108 109.6 107  BMI 19.75 20.05 19.57    Physical Exam Vitals reviewed.  Constitutional:      Appearance: Normal appearance. She is normal weight.  Neck:     Vascular: No carotid bruit.  Cardiovascular:     Rate and Rhythm: Normal rate and regular rhythm.     Pulses: Normal pulses.     Heart sounds: Normal heart sounds.  Pulmonary:     Effort: Pulmonary effort is normal. No respiratory distress.     Breath sounds: Normal breath sounds.  Abdominal:     General: Abdomen is flat. Bowel sounds are normal.     Palpations: Abdomen is soft.     Tenderness: There is no abdominal tenderness.  Skin:    Findings: Bruising present.  Neurological:     Mental Status: She is alert and oriented to person, place, and time.  Psychiatric:        Mood and Affect: Mood normal.        Behavior: Behavior normal.    Diabetic Foot Exam - Simple   No data filed      Lab Results  Component Value Date   WBC 7.9 10/22/2020   HGB 12.8 10/22/2020   HCT 39.4 10/22/2020   PLT 160 10/22/2020   GLUCOSE 106 (H) 10/22/2020   CHOL 220 (H) 10/22/2020   TRIG 182 (H) 10/22/2020   HDL 78 10/22/2020   LDLDIRECT 94.2 12/05/2013   LDLCALC 111 (H) 10/22/2020   ALT 23 10/22/2020   AST 30 10/22/2020   NA 140 10/22/2020   K 4.2 10/22/2020   CL 103 10/22/2020   CREATININE 0.99 10/22/2020   BUN 26 10/22/2020    CO2 22 10/22/2020   TSH 1.64 03/23/2020   INR 1.3 (H) 11/12/2020   HGBA1C 6.7 (H) 10/22/2020   MICROALBUR <0.7 03/23/2020      Assessment & Plan:   1. Mixed hyperlipidemia No changes to medicines.  Continue to work on eating a healthy diet and exercise.  Labs drawn today.  - Lipid panel  2. Diabetic glomerulopathy ([place]) The current medical regimen is effective;  continue present plan and medications. Management per specialist.  - CBC with Differential/Platelet  3. Essential hypertension The current medical regimen is effective;  continue present plan and medications. - Comprehensive metabolic panel  4. Major depressive disorder, recurrent episode, mild (HCC) The current medical regimen is effective;  continue present plan and medications.  5. Postmenopause - DG Bone Density   6. Spontaneous bruising 7. Acquired thrombophilia.   On xarelto for an axillary dvt.  I will send a message to cardiology as I think it would be prudent to decrease dose of xarelto. Her bruising truly is excessive.  Orders Placed This Encounter  Procedures   HM DIABETES EYE EXAM     Follow-up: Return in about 3 months (around 05/24/2021) for fasting.  An After Visit Summary was printed and given to the patient.  [name], MD [name] [phone]

## 2021-02-22 ENCOUNTER — Other Ambulatory Visit: Payer: Self-pay

## 2021-02-22 LAB — LIPID PANEL
Chol/HDL Ratio: 2.4 ratio (ref 0.0–4.4)
Cholesterol, Total: 178 mg/dL (ref 100–199)
HDL: 74 mg/dL (ref >39)
LDL Chol Calc (NIH): 89 mg/dL (ref 0–99)
Triglycerides: 85 mg/dL (ref 0–149)
VLDL Cholesterol Cal: 15 mg/dL (ref 5–40)

## 2021-02-22 LAB — CBC WITH DIFFERENTIAL/PLATELET
Basophils Absolute: 0.1 10*3/uL (ref 0.0–0.2)
Basos: 1 %
EOS (ABSOLUTE): 0.2 10*3/uL (ref 0.0–0.4)
Eos: 4 %
Hematocrit: 38.3 % (ref 34.0–46.6)
Hemoglobin: 12.4 g/dL (ref 11.1–15.9)
Immature Grans (Abs): 0.1 10*3/uL (ref 0.0–0.1)
Immature Granulocytes: 1 %
Lymphocytes Absolute: 1.5 10*3/uL (ref 0.7–3.1)
Lymphs: 21 %
MCH: 29.5 pg (ref 26.6–33.0)
MCHC: 32.4 g/dL (ref 31.5–35.7)
MCV: 91 fL (ref 79–97)
Monocytes Absolute: 0.6 10*3/uL (ref 0.1–0.9)
Monocytes: 9 %
Neutrophils Absolute: 4.4 10*3/uL (ref 1.4–7.0)
Neutrophils: 64 %
Platelets: 160 10*3/uL (ref 150–450)
RBC: 4.2 x10E6/uL (ref 3.77–5.28)
RDW: 13.1 % (ref 11.7–15.4)
WBC: 6.8 10*3/uL (ref 3.4–10.8)

## 2021-02-22 LAB — COMPREHENSIVE METABOLIC PANEL
ALT: 25 IU/L (ref 0–32)
AST: 35 IU/L (ref 0–40)
Albumin/Globulin Ratio: 1.7 (ref 1.2–2.2)
Albumin: 3.8 g/dL (ref 3.7–4.7)
Alkaline Phosphatase: 118 IU/L (ref 44–121)
BUN/Creatinine Ratio: 28 (ref 12–28)
BUN: 32 mg/dL — ABNORMAL HIGH (ref 8–27)
Bilirubin Total: 0.4 mg/dL (ref 0.0–1.2)
CO2: 20 mmol/L (ref 20–29)
Calcium: 9.5 mg/dL (ref 8.7–10.3)
Chloride: 102 mmol/L (ref 96–106)
Creatinine, Ser: 1.16 mg/dL — ABNORMAL HIGH (ref 0.57–1.00)
Globulin, Total: 2.2 g/dL (ref 1.5–4.5)
Glucose: 122 mg/dL — ABNORMAL HIGH (ref 65–99)
Potassium: 4.7 mmol/L (ref 3.5–5.2)
Sodium: 138 mmol/L (ref 134–144)
Total Protein: 6 g/dL (ref 6.0–8.5)
eGFR: 49 mL/min/{1.73_m2} — ABNORMAL LOW (ref >59)

## 2021-02-22 LAB — CARDIOVASCULAR RISK ASSESSMENT

## 2021-02-24 NOTE — Progress Notes (Signed)
Chronic Care Management Pharmacy Note    02/28/2021 Name:  Bridget [name] MRN:  [id number] DOB:  09-Oct-1940   Plan Recommendations:   Blood sugar 87, 121, 164, 128, 144 mg/dL.Has cut back on tea - now drinking all water. Using Splenda instead of sugar. Ate sweet potatoes yesterday but hasn't been eating a lot of white potatoes. Discussed the former recommendation of adding Farxiga from endocrinology. Pharmacist discussed benefits and ability to get medication through [place] [employer].   Blood pressure: 105/52 mmHg today. Pharmacist asked patient to recheck blood pressure and patient got reading of 108/47 mmHg pulse 67 on left arm and 124/63 pulse of 68 bpm on right arm. Previous readings in the past week: 126/65, 129/67, 107/60. Pulse 71 bpm. Patient has been drinking more water after her kidneys looked worse with last blood work. Pharmacist asking Dr. Tobie Poet about low diastolic readings and recommendation. Patient was reduced on carvedilol and furosemide dose with last cardiology visit. Patient states that she is tired often, feels jittery and dizzy from time to time.   Subjective: Bridget [name] is an 80 y.o. year old female who is a primary patient of Cox, Kirsten, MD.  The CCM team was consulted for assistance with disease management and care coordination needs.    Engaged with patient by telephone for follow up visit in response to provider referral for pharmacy case management and/or care coordination services.   Consent to Services:  The patient was given information about Chronic Care Management services, agreed to services, and gave verbal consent prior to initiation of services.  Please see initial visit note for detailed documentation.   Patient Care Team: [name], MD as PCP - General (Family Medicine) Elayne Snare, MD as Consulting Physician (Endocrinology) Burnice Logan, [facility] as Pharmacist (Pharmacist) Marice Potter, MD as Consulting Physician (Oncology) Flossie Buffy., MD as Referring Physician (Cardiology) Jackquline Denmark, MD as Consulting Physician (Gastroenterology) [place], [facility] (Ophthalmology)  Recent office visits: 02/21/2021 - bone density ordered. LDL improved but above goal of 70. Increase Recommend change fluvastatin to Lovastatin 40 mg daily.  01/13/2021 - AWV.  11/05/2020 - acute cystitis - macrobid.  10/22/2020 - fluvastatin 20 mg daily at bedtime ordered. INR elevated could be why bruising in addition to Xarelto. LDL 111. Blood count normal. Kidney function normal. Liver function normal. A1c 6.7%.   Recent consult visits: 12/23/2020 - cardiology - cut the carvedilol to 1 whole pill two times daily and call if low bp issues. Cutting the furosemide every other day with extra as needed.  10/13/2020 - HEM/ONC -stable anemia of chronic disease. Follow-up in 6 months.  10/08/2020 - Cardiology - device check.  09/27/2020 - pacemaker upgrade.  09/24/2020 - Endo - consider SGLT2 but usually doesn't want brand name medications. May need to be coordinated with cardio. Increase evening metformin to 1000 mg in the evening. Fluvastatin recommended.   Hospital visits:   Objective:  Lab Results  Component Value Date   CREATININE 1.16 (H) 02/21/2021   BUN 32 (H) 02/21/2021   GFR 32.91 (L) 03/23/2020   GFRNONAA 60 09/08/2020   GFRAA 69 09/08/2020   NA 138 02/21/2021   K 4.7 02/21/2021   CALCIUM 9.5 02/21/2021   CO2 20 02/21/2021    Lab Results  Component Value Date/Time   HGBA1C 6.7 (H) 10/22/2020 09:01 AM   HGBA1C 6.0 (A) 09/24/2020 09:30 AM   HGBA1C 5.9 (A) 03/23/2020 08:35 AM   HGBA1C 6.3 (H) 11/20/2019 09:31  AM   GFR 32.91 (L) 03/23/2020 08:52 AM   GFR 54.56 (L) 01/02/2019 09:32 AM   MICROALBUR <0.7 03/23/2020 10:49 AM   MICROALBUR 1.8 01/02/2019 09:32 AM    Last diabetic Eye exam:  Lab Results  Component Value Date/Time   HMDIABEYEEXA No Retinopathy 01/12/2021 12:00 AM    Last diabetic Foot exam: No results  found for: HMDIABFOOTEX   Lab Results  Component Value Date   CHOL 178 02/21/2021   HDL 74 02/21/2021   LDLCALC 89 02/21/2021   LDLDIRECT 94.2 12/05/2013   TRIG 85 02/21/2021   CHOLHDL 2.4 02/21/2021    Hepatic Function Latest Ref Rng & Units 02/21/2021 10/22/2020 10/13/2020  Total Protein 6.0 - 8.5 g/dL 6.0 6.0 -  Albumin 3.7 - 4.7 g/dL 3.8 3.7 3.8  AST 0 - 40 IU/L 35 30 36(A)  ALT 0 - 32 IU/L 25 23 21   Alk Phosphatase 44 - 121 IU/L 118 114 101  Total Bilirubin 0.0 - 1.2 mg/dL 0.4 0.4 -    Lab Results  Component Value Date/Time   TSH 1.64 03/23/2020 08:52 AM   TSH 1.40 01/02/2019 09:32 AM   FREET4 1.28 03/23/2020 08:52 AM   FREET4 1.02 01/02/2019 09:32 AM    CBC Latest Ref Rng & Units 02/21/2021 10/22/2020 10/13/2020  WBC 3.4 - 10.8 x10E3/uL 6.8 7.9 8.1  Hemoglobin 11.1 - 15.9 g/dL 12.4 12.8 13.0  Hematocrit 34.0 - 46.6 % 38.3 39.4 39  Platelets 150 - 450 x10E3/uL 160 160 172    No results found for: VD25OH  Clinical ASCVD: No  The 10-year ASCVD risk score Mikey Bussing DC Jr., et al., 2013) is: 35.3%   Values used to calculate the score:     Age: 80 years     Sex: Female     Is Non-Hispanic African American: No     Diabetic: Yes     Tobacco smoker: No     Systolic Blood Pressure: 371 mmHg     Is BP treated: Yes     HDL Cholesterol: 74 mg/dL     Total Cholesterol: 178 mg/dL    Depression screen [facility] 2/9 02/21/2021 01/13/2021 10/22/2020  Decreased Interest 0 0 0  Down, Depressed, Hopeless 0 0 1  PHQ - 2 Score 0 0 1  Altered sleeping 1 - 1  Tired, decreased energy 1 - 0  Change in appetite 0 - 1  Feeling bad or failure about yourself  0 - 0  Trouble concentrating 2 - 2  Moving slowly or fidgety/restless 1 - 0  Suicidal thoughts 0 - 0  PHQ-9 Score 5 - 5  Difficult doing work/chores Somewhat difficult - Somewhat difficult     Social History   Tobacco Use  Smoking Status Never  Smokeless Tobacco Never   BP Readings from Last 3 Encounters:  02/21/21 110/64  01/13/21  118/60  10/22/20 110/70   Pulse Readings from Last 3 Encounters:  02/21/21 67  01/13/21 88  10/22/20 68   Wt Readings from Last 3 Encounters:  02/21/21 108 lb (49 kg)  01/13/21 109 lb 9.6 oz (49.7 kg)  11/05/20 107 lb (48.5 kg)    Assessment/Interventions: Review of patient past medical history, allergies, medications, health status, including review of consultants reports, laboratory and other test data, was performed as part of comprehensive evaluation and provision of chronic care management services.   SDOH:  (Social Determinants of Health) assessments and interventions performed: Yes   CCM Care Plan  Allergies  Allergen Reactions  Atorvastatin Other (See Comments)    "muscle Cramps"   Budesonide Other (See Comments)    "fatigue"   Gabapentin Other (See Comments)    "fatigue"   Metoclopramide Other (See Comments)    "mayalgia"   Rosuvastatin Other (See Comments)    "muscle cramps"   Simvastatin Other (See Comments)    "muscle Cramps"   Sulfamethoxazole-Trimethoprim Rash    Medications Reviewed Today     Reviewed by Burnice Logan, [facility] (Pharmacist) on 02/28/21 at 1012  Med List Status: <None>   Medication Order Taking? Sig Documenting Provider Last Dose Status Informant  albuterol (VENTOLIN HFA) 108 (90 Base) MCG/ACT inhaler [id number] Yes INHALE 1 TO 2 PUFFS BY MOUTH EVERY 4 HOURS AS NEEDED FOR WHEEZING Cox, Kirsten, MD Taking Active   aspirin 81 MG chewable tablet [id number] Yes Chew 81 mg by mouth in the morning.  [provider] Taking Active Self  Blood Glucose Monitoring Suppl (ONETOUCH VERIO FLEX SYSTEM) w/Device KIT [id number] Yes Check sugar once daily Elayne Snare, MD Taking Active   Budeson-Glycopyrrol-Formoterol (BREZTRI AEROSPHERE) 160-9-4.8 MCG/ACT AERO [id number] Yes Inhale 2 puffs into the lungs 2 (two) times daily. Cox, Kirsten, MD Taking Active   calcium-vitamin D (OSCAL WITH D) 500-200 MG-UNIT TABS tablet [id number] Yes Take 1 tablet by mouth  daily. [provider] Taking Active   carvedilol (COREG) 25 MG tablet [id number] Yes Take 25 mg by mouth 2 (two) times daily with a meal. [provider] Taking Active Self  Cholecalciferol (VITAMIN D-3) 1000 UNITS CAPS [id number] Yes Take 1,000 Units by mouth daily. [provider] Taking Active   furosemide (LASIX) 20 MG tablet [id number]  Take 1 tablet (20 mg total) by mouth daily. Cox, Kirsten, MD  Active   Glucagon, rDNA, (GLUCAGON EMERGENCY) 1 MG KIT [id number]  Inject 1 kit as directed once for 1 dose. If sugars < 70, if you cannot increase with oral glucose. [name], MD  Expired 05/20/20 2359   glucose blood (ONETOUCH VERIO) test strip [id number] Yes 1 each by Other route as needed for other. Use as instructed to check blood sugar once daily  DX CODE E11.9 Elayne Snare, MD Taking Active   losartan (COZAAR) 25 MG tablet [id number] Yes Take 25 mg by mouth daily. [provider] Taking Active   lovastatin (MEVACOR) 40 MG tablet [id number] Yes Take 40 mg by mouth at bedtime. [provider] Taking Active   Magnesium 250 MG TABS [id number] Yes Take 250 mg by mouth daily. [provider] Taking Active   metFORMIN (GLUCOPHAGE) 500 MG tablet [id number] Yes TAKE 1 TABLET BY MOUTH TWICE DAILY WITH MORNING MEAL AND WITH EVENING MEAL Elayne Snare, MD Taking Active   Multiple Vitamins-Minerals (CENTRUM SILVER ULTRA WOMENS PO) [id number] Yes Take by mouth daily. [provider] Taking Active   nitroGLYCERIN (NITROSTAT) 0.4 MG SL tablet [id number] Yes Place 0.4 mg under the tongue every 5 (five) minutes as needed for chest pain. [provider] Taking Active   OneTouch Delica Lancets 50I MISC [id number] Yes USE 1  TO CHECK GLUCOSE ONCE DAILY *APPOINTMENT  REQUIRED  FOR  FUTURE  REFILLS Elayne Snare, MD Taking Active   polyethylene glycol [facility] / GLYCOLAX) packet [id number] Yes Take 17 g by mouth daily as needed. [provider] Taking  Active   venlafaxine XR (EFFEXOR-XR) 75 MG 24 hr capsule [id number] Yes Take 1 capsule (75 mg total) by mouth daily. Cox, Kirsten, MD Taking Active   XARELTO 15 MG TABS tablet  [id number]  Take 1 tablet by mouth once daily Marge Duncans, [place]  Active             Patient Active Problem List   Diagnosis Date Noted   Anemia of chronic renal failure 10/13/2020   Chronic kidney disease, stage II (mild) 10/13/2020   Drug-induced myopathy 06/25/2020   Acute congestive heart failure ([place]) 06/25/2020   Presence of cardiac pacemaker 06/25/2020   Major depressive disorder, recurrent episode, mild ([place]) 06/25/2020   Nonischemic dilated cardiomyopathy ([place]) 06/18/2020   Heart failure with reduced ejection fraction, NYHA class III ([place]) 05/29/2020   History of cardiac cath 03/19/2020   LV dysfunction 02/04/2020   Dyspnea on exertion 01/06/2020   Hypoxia 01/06/2020   Diabetic glomerulopathy ([place]) 11/20/2019   Diarrhea 11/20/2019   Other constipation 11/20/2019   Night sweats 11/20/2019   Adhesive capsulitis of right shoulder 11/20/2019   At moderate risk for fall 11/20/2019   TIA (transient ischemic attack) 06/03/2018   History of DVT (deep vein thrombosis) 10/13/2016   Essential hypertension 06/06/2013   Type II or unspecified type diabetes mellitus without mention of complication, not stated as uncontrolled 06/05/2013   Mixed hyperlipidemia 06/05/2013   Thyroid nodule 06/05/2013    Immunization History  Administered Date(s) Administered   Fluad Quad(high Dose 65+) 05/20/2020   Influenza, High Dose Seasonal PF 06/05/2017   Influenza,trivalent, recombinat, inj, PF 05/14/2012   Influenza-Unspecified 07/02/2014, 04/13/2015, 04/24/2016, 06/14/2018   PFIZER(Purple Top)SARS-COV-2 Vaccination 08/29/2019, 09/19/2019, 05/14/2020, 01/03/2021   Pneumococcal Conjugate-13 04/27/2014   Pneumococcal Polysaccharide-23 07/14/2010, 07/14/2010, 12/02/2015   Tdap 11/01/2017    Conditions to be  addressed/monitored:  Hypertension, Hyperlipidemia, Diabetes, Heart Failure, Depression, Osteoporosis and anemia  Care Plan : [place]  Updates made by Burnice Logan, [place] since 02/28/2021 12:00 AM     Problem: dm, chf, hld   Priority: High  Onset Date: 10/25/2020     Long-Range Goal: Disease State Management   Start Date: 10/25/2020  Expected End Date: 10/25/2021  Recent Progress: On track  Priority: High  Note:   Current Barriers:  Unable to independently afford treatment regimen  Pharmacist Clinical Goal(s):  Over the next 90 days, patient will verbalize ability to afford treatment regimen through collaboration with PharmD and provider.   Interventions: 1:1 collaboration with Cox, Kirsten, MD regarding development and update of comprehensive plan of care as evidenced by provider attestation and co-signature Inter-disciplinary care team collaboration (see longitudinal plan of care) Comprehensive medication review performed; medication list updated in electronic medical record  Hypertension (BP goal <130/80) -Controlled -Current treatment: Carvedilol 25 mg  1 tablet mg bid Losartan 25 mg daily  -Medications previously tried:   -Current home readings: today is 105/59 mmHg. Had patient recheck and pharmacist calling back to determine updated blood pressure.  -Current dietary habits: eats healthy overall. Meat and vegetables mainly. Currently putting up fresh garden vegetables.  -Current exercise habits: walking some -Denies hypotensive/hypertensive symptoms -Educated on Daily salt intake goal < 2300 mg; Exercise goal of 150 minutes per week; Importance of home blood pressure monitoring; -Counseled to monitor BP at home daily, document, and provide log at future appointments -Counseled on diet and exercise extensively Recommended to continue current medication  Diabetes (A1c goal <7%) -Controlled -Current medications: metformin 500 mg morning and 500 mg  evening meals Glucagon 1 mg as directed -Medications previously tried: pioglitazone  -Current home glucose readings fasting glucose: 87, 121, 164, 128, 144  post prandial glucose: not taking  -Denies  hypoglycemic/hyperglycemic symptoms -Current meal patterns:  Patient reports healthy diet and mainly eats at home.  -Current exercise: walking  -Educated on A1c and blood sugar goals; Complications of diabetes including kidney damage, retinal damage, and cardiovascular disease; Exercise goal of 150 minutes per week; -Counseled to check feet daily and get yearly eye exams -Counseled on diet and exercise extensively Counseled on considering SGLT2 medication for blood sugar, chf and kidney benefits per endocrinology recommendation.  Recommended looking up Wilder Glade since patient is already approved for the patient assistance program.   Heart Failure (Goal: manage symptoms and prevent exacerbations) -Controlled -Last ejection fraction: 20-25% (Date: 07/2020) -HF type: Diastolic -NYHA Class: III (marked limitation of activity) -Current treatment: carvedilol 25 mg bid  Furosemide 20 mg every other day unless swelling increases Losartan 25 mg daily  Nitroglycerin 0.4 mg sl every 5 minutes prn chest pain -Medications previously tried: none reported -Current home BP/HR readings: well controlled currently -Current dietary habits: eating healthy diet. Meat and vegetables.  -Current exercise habits: walking more and working to increase stamina since pacemaker updated 09/27/2020.  -Educated on Benefits of medications for managing symptoms and prolonging life Importance of blood pressure control -Counseled on diet and exercise extensively Recommended to continue current medication Recommended considering Farxiga.   Hyperlipidemia: (LDL goal < 55) -Uncontrolled -Current treatment: lovastatin 40 mg daily at bedtime  Aspirin 81 mg daily  -Medications previously tried: atorvastatin, rosuvastatin,  simvastatin, fluvastatin -Current dietary patterns: overall healthy diet.  -Current exercise habits: walking more and working to rebuild stamina.  -Educated on Cholesterol goals;  Benefits of statin for ASCVD risk reduction; Importance of limiting foods high in cholesterol; Exercise goal of 150 minutes per week; -Counseled on diet and exercise extensively Recommended to continue current medication   Patient Goals/Self-Care Activities Over the next 90 days, patient will:  - take medications as prescribed focus on medication adherence by using pill box check glucose daily, document, and provide at future appointments check blood pressure daily, document, and provide at future appointments Contact provider or pharmacist if patient decides to trial [place].   Follow Up Plan: Telephone follow up appointment with care management team member scheduled for: 04/2021       Medication Assistance:  [place]ained through [place] [employer] medication assistance program.  Enrollment ends 08/13/2021  Patient's preferred pharmacy is:  [facility] [address], [place] [address] [place] [address] Phone: [phone] Fax: [phone]  Uses pill box? Yes Pt endorses 100% compliance  We discussed: Benefits of medication synchronization, packaging and delivery as well as enhanced pharmacist oversight with [employer]. Patient decided to: Continue current medication management strategy  Care Plan and Follow Up Patient Decision:  Patient agrees to Care Plan and Follow-up.  Plan: Telephone follow up appointment with care management team member scheduled for:  04/2021

## 2021-02-28 ENCOUNTER — Ambulatory Visit (INDEPENDENT_AMBULATORY_CARE_PROVIDER_SITE_OTHER): Payer: Medicare Other

## 2021-02-28 DIAGNOSIS — E782 Mixed hyperlipidemia: Secondary | ICD-10-CM

## 2021-02-28 DIAGNOSIS — E1121 Type 2 diabetes mellitus with diabetic nephropathy: Secondary | ICD-10-CM | POA: Diagnosis not present

## 2021-02-28 NOTE — Patient Instructions (Signed)
Visit Information   Goals Addressed             This Visit's Progress    Learn More About My Health   On track    Timeframe:  Long-Range Goal Priority:  High Start Date:     10/25/20                  Expected End Date:     10/25/2021                   Follow Up Date 02/28/2021    - tell my story and reason for my visit - make a list of questions - repeat what I heard to make sure I understand - bring a list of my medicines to the visit    Why is this important?   The best way to learn about your health and care is by talking to the doctor and nurse.  They will answer your questions and give you information in the way that you like best.    Notes:      Monitor and Manage My Blood Sugar-Diabetes Type 2   On track    Timeframe:  Long-Range Goal Priority:  High Start Date:          10/25/20                    Expected End Date: 10/25/2021  Follow Up Date 02/28/2021    - check blood sugar at prescribed times - check blood sugar if I feel it is too high or too low - take the blood sugar log to all doctor visits    Why is this important?   Checking your blood sugar at home helps to keep it from getting very high or very low.  Writing the results in a diary or log helps the doctor know how to care for you.  Your blood sugar log should have the time, date and the results.  Also, write down the amount of insulin or other medicine that you take.  Other information, like what you ate, exercise done and how you were feeling, will also be helpful.     Notes:      Track and Manage Symptoms-Heart Failure   On track    Timeframe:  Long-Range Goal Priority:  High Start Date:         10/25/20                     Expected End Date:     10/25/2021                  Follow Up Date 02/28/2021    - eat more whole grains, fruits and vegetables, lean meats and healthy fats - track symptoms and what helps feel better or worse    Why is this important?   You will be able to handle  your symptoms better if you keep track of them.  Making some simple changes to your lifestyle will help.  Eating healthy is one thing you can do to take good care of yourself.    Notes:        Patient Care Plan: CCM Pharmacy Care Plan     Problem Identified: dm, chf, hld   Priority: High  Onset Date: 10/25/2020     Long-Range Goal: Disease State Management   Start Date: 10/25/2020  Expected End Date: 10/25/2021  Recent Progress: On track  Priority: High  Note:   Current Barriers:  Unable to independently afford treatment regimen  Pharmacist Clinical Goal(s):  Over the next 90 days, patient will verbalize ability to afford treatment regimen through collaboration with PharmD and provider.   Interventions: 1:1 collaboration with Cox, Kirsten, MD regarding development and update of comprehensive plan of care as evidenced by provider attestation and co-signature Inter-disciplinary care team collaboration (see longitudinal plan of care) Comprehensive medication review performed; medication list updated in electronic medical record  Hypertension (BP goal <130/80) -Controlled -Current treatment: Carvedilol 25 mg  1 tablet mg bid Losartan 25 mg daily  -Medications previously tried:   -Current home readings: today is 105/59 mmHg. Had patient recheck and pharmacist calling back to determine updated blood pressure.  -Current dietary habits: eats healthy overall. Meat and vegetables mainly. Currently putting up fresh garden vegetables.  -Current exercise habits: walking some -Denies hypotensive/hypertensive symptoms -Educated on Daily salt intake goal < 2300 mg; Exercise goal of 150 minutes per week; Importance of home blood pressure monitoring; -Counseled to monitor BP at home daily, document, and provide log at future appointments -Counseled on diet and exercise extensively Recommended to continue current medication  Diabetes (A1c goal <7%) -Controlled -Current  medications: metformin 500 mg morning and 500 mg evening meals Glucagon 1 mg as directed -Medications previously tried: pioglitazone  -Current home glucose readings fasting glucose: 87, 121, 164, 128, 144  post prandial glucose: not taking  -Denies hypoglycemic/hyperglycemic symptoms -Current meal patterns:  Patient reports healthy diet and mainly eats at home.  -Current exercise: walking  -Educated on A1c and blood sugar goals; Complications of diabetes including kidney damage, retinal damage, and cardiovascular disease; Exercise goal of 150 minutes per week; -Counseled to check feet daily and get yearly eye exams -Counseled on diet and exercise extensively Counseled on considering SGLT2 medication for blood sugar, chf and kidney benefits per endocrinology recommendation.  Recommended looking up Marcelline Deist since patient is already approved for the patient assistance program.   Heart Failure (Goal: manage symptoms and prevent exacerbations) -Controlled -Last ejection fraction: 20-25% (Date: 07/2020) -HF type: Diastolic -NYHA Class: III (marked limitation of activity) -Current treatment: carvedilol 25 mg bid  Furosemide 20 mg every other day unless swelling increases Losartan 25 mg daily  Nitroglycerin 0.4 mg sl every 5 minutes prn chest pain -Medications previously tried: none reported -Current home BP/HR readings: well controlled currently -Current dietary habits: eating healthy diet. Meat and vegetables.  -Current exercise habits: walking more and working to increase stamina since pacemaker updated 09/27/2020.  -Educated on Benefits of medications for managing symptoms and prolonging life Importance of blood pressure control -Counseled on diet and exercise extensively Recommended to continue current medication Recommended considering Farxiga.   Hyperlipidemia: (LDL goal < 55) -Uncontrolled -Current treatment: lovastatin 40 mg daily at bedtime  Aspirin 81 mg daily   -Medications previously tried: atorvastatin, rosuvastatin, simvastatin, fluvastatin -Current dietary patterns: overall healthy diet.  -Current exercise habits: walking more and working to rebuild stamina.  -Educated on Cholesterol goals;  Benefits of statin for ASCVD risk reduction; Importance of limiting foods high in cholesterol; Exercise goal of 150 minutes per week; -Counseled on diet and exercise extensively Recommended to continue current medication   Patient Goals/Self-Care Activities Over the next 90 days, patient will:  - take medications as prescribed focus on medication adherence by using pill box check glucose daily, document, and provide at future appointments check blood pressure daily, document, and provide at future appointments Contact provider or pharmacist if patient decides to  trial [place].   Follow Up Plan: Telephone follow up appointment with care management team member scheduled for: 04/2021      The patient verbalized understanding of instructions, educational materials, and care plan provided today and declined offer to receive copy of patient instructions, educational materials, and care plan.  Telephone follow up appointment with pharmacy team member scheduled for: 04/2021  Earvin Hansen, [facility]

## 2021-03-01 ENCOUNTER — Telehealth: Payer: Self-pay | Admitting: Family Medicine

## 2021-03-01 NOTE — Telephone Encounter (Signed)
   Bridget [name] has been scheduled for the following appointment:  WHAT: BONE DENSITY WHERE: [facility] DATE: 03/30/21 TIME: 8:00 AM ARRIVAL TIME  Patient has been made aware.

## 2021-03-03 ENCOUNTER — Encounter: Payer: Self-pay | Admitting: Family Medicine

## 2021-03-10 ENCOUNTER — Other Ambulatory Visit: Payer: Self-pay | Admitting: Family Medicine

## 2021-03-25 ENCOUNTER — Encounter: Payer: Self-pay | Admitting: Endocrinology

## 2021-03-25 ENCOUNTER — Ambulatory Visit (INDEPENDENT_AMBULATORY_CARE_PROVIDER_SITE_OTHER): Payer: Medicare Other | Admitting: Endocrinology

## 2021-03-25 ENCOUNTER — Other Ambulatory Visit: Payer: Self-pay

## 2021-03-25 VITALS — BP 118/67 | HR 67 | Ht 61.0 in | Wt 110.6 lb

## 2021-03-25 DIAGNOSIS — I10 Essential (primary) hypertension: Secondary | ICD-10-CM

## 2021-03-25 DIAGNOSIS — E782 Mixed hyperlipidemia: Secondary | ICD-10-CM | POA: Diagnosis not present

## 2021-03-25 DIAGNOSIS — E119 Type 2 diabetes mellitus without complications: Secondary | ICD-10-CM | POA: Diagnosis not present

## 2021-03-25 LAB — POCT GLYCOSYLATED HEMOGLOBIN (HGB A1C): Hemoglobin A1C: 6.6 % — AB (ref 4.0–5.6)

## 2021-03-25 LAB — MICROALBUMIN / CREATININE URINE RATIO
Creatinine,U: 169.1 mg/dL
Microalb Creat Ratio: 2.4 mg/g (ref 0.0–30.0)
Microalb, Ur: 4 mg/dL — ABNORMAL HIGH (ref 0.0–1.9)

## 2021-03-25 MED ORDER — ONETOUCH VERIO VI STRP: 2.0000 | ORAL_STRIP | 1 refills | Status: DC | PRN

## 2021-03-25 NOTE — Patient Instructions (Signed)
Metformin 2 in pm, call if not tolerated

## 2021-03-25 NOTE — Progress Notes (Signed)
Patient ID: Bridget [name], female   DOB: 06-Dec-1940, 80 y.o.   MRN: [id number]   Reason for Appointment: follow-up   History of Present Illness   Diagnosis: Type 2 DIABETES MELITUS, date of diagnosis:  2007     Previous history: Her A1c at diagnosis was 7.9% She has been treated with a combination of metformin ER and Actos for several years Usually her A1c has been upper normal and her last A1c was 5.8 in 4/14  Recent history:  Her A1c is about the same at  6.6 compared to 6.7  She has been using a new One Touch meter and she thinks this is more accurate  However even though her fasting readings look better at home her A1c is not improved Overall average 122 compared to 145 Her fasting blood sugars have been checked more often than the evening readings Weight is about the same, she monitors regularly at home She was told to take 2 tablets of metformin at dinnertime instead of 1 but she forgot She is only limited physical activity because of her shortness of breath  Generally she is reluctant to take any brand-name medication because of cost     Oral hypoglycemic drugs:   metformin  500 mg, 1 tablet 2x a day         Side effects from medications:  Abdominal discomfort from high-dose metformin       Monitors blood glucose:  once a day or less.    Glucometer: One Touch Ultra.           Blood Glucose readings from monitor download:    PRE-MEAL Fasting Lunch Dinner Bedtime Overall  Glucose range: 117-144    85-164  Mean/median: 126       POST-MEAL PC Breakfast PC Lunch PC Dinner  Glucose range:   164  Mean/median:      Previously:  PRE-MEAL Fasting Lunch Dinner Bedtime Overall  Glucose range:  126-170  125  183, 109  143, 169  100-191  Mean/median:  150     145   POST-MEAL PC Breakfast PC Lunch PC Dinner  Glucose range:   191   Mean/median:       Meals: 3 meals per day. Usually follows a healthy diet, portions controlled             Dietician  visit: Most recent: 2007            Wt Readings from Last 3 Encounters:  03/25/21 110 lb 9.6 oz (50.2 kg)  02/21/21 108 lb (49 kg)  01/13/21 109 lb 9.6 oz (49.7 kg)   Lab Results  Component Value Date   HGBA1C 6.6 (A) 03/25/2021   HGBA1C 6.7 (H) 10/22/2020   HGBA1C 6.0 (A) 09/24/2020   Lab Results  Component Value Date   MICROALBUR <0.7 03/23/2020   LDLCALC 89 02/21/2021   CREATININE 1.16 (H) 02/21/2021   Other active problems discussed: See review of systems   Allergies as of 03/25/2021       Reactions   Atorvastatin Other (See Comments)   "muscle Cramps"   Budesonide Other (See Comments)   "fatigue"   Gabapentin Other (See Comments)   "fatigue"   Metoclopramide Other (See Comments)   "mayalgia"   Rosuvastatin Other (See Comments)   "muscle cramps"   Simvastatin Other (See Comments)   "muscle Cramps"   Sulfamethoxazole-trimethoprim Rash        Medication List        Accurate  as of March 25, 2021  8:58 AM. If you have any questions, ask your nurse or doctor.          albuterol 108 (90 Base) MCG/ACT inhaler Commonly known as: VENTOLIN HFA INHALE 1 TO 2 PUFFS BY MOUTH EVERY 4 HOURS AS NEEDED FOR WHEEZING   aspirin 81 MG chewable tablet Chew 81 mg by mouth in the morning.   Breztri Aerosphere 160-9-4.8 MCG/ACT Aero Generic drug: Budeson-Glycopyrrol-Formoterol Inhale 2 puffs into the lungs 2 (two) times daily.   calcium-vitamin D 500-200 MG-UNIT Tabs tablet Commonly known as: OSCAL WITH D Take 1 tablet by mouth daily.   carvedilol 25 MG tablet Commonly known as: COREG Take 25 mg by mouth 2 (two) times daily with a meal.   CENTRUM SILVER ULTRA WOMENS PO Take by mouth daily.   furosemide 20 MG tablet Commonly known as: LASIX Take 1 tablet (20 mg total) by mouth daily. What changed: additional instructions   Glucagon Emergency 1 MG Kit Inject 1 kit as directed once for 1 dose. If sugars < 70, if you cannot increase with oral glucose.    losartan 25 MG tablet Commonly known as: COZAAR Take 25 mg by mouth daily.   lovastatin 40 MG tablet Commonly known as: MEVACOR Take 40 mg by mouth at bedtime.   Magnesium 250 MG Tabs Take 250 mg by mouth daily.   metFORMIN 500 MG tablet Commonly known as: GLUCOPHAGE TAKE 1 TABLET BY MOUTH TWICE DAILY WITH MORNING MEAL AND WITH EVENING MEAL   nitroGLYCERIN 0.4 MG SL tablet Commonly known as: NITROSTAT Place 0.4 mg under the tongue every 5 (five) minutes as needed for chest pain.   OneTouch Delica Lancets 21H Misc USE 1  TO CHECK GLUCOSE ONCE DAILY *APPOINTMENT  REQUIRED  FOR  FUTURE  REFILLS   OneTouch Verio Flex System w/Device Kit Check sugar once daily   OneTouch Verio test strip Generic drug: glucose blood 2 each by Other route as needed for other. Use as instructed to check blood sugar once daily  DX CODE E11.9 What changed: how much to take Changed by: Elayne Snare, MD   polyethylene glycol 17 g packet Commonly known as: MIRALAX / GLYCOLAX Take 17 g by mouth daily as needed.   venlafaxine XR 75 MG 24 hr capsule Commonly known as: EFFEXOR-XR Take 1 capsule (75 mg total) by mouth daily.   Vitamin D-3 25 MCG (1000 UT) Caps Take 1,000 Units by mouth daily.   Xarelto 15 MG Tabs tablet Generic drug: Rivaroxaban Take 1 tablet by mouth once daily        Allergies:  Allergies  Allergen Reactions   Atorvastatin Other (See Comments)    "muscle Cramps"   Budesonide Other (See Comments)    "fatigue"   Gabapentin Other (See Comments)    "fatigue"   Metoclopramide Other (See Comments)    "mayalgia"   Rosuvastatin Other (See Comments)    "muscle cramps"   Simvastatin Other (See Comments)    "muscle Cramps"   Sulfamethoxazole-Trimethoprim Rash    Past Medical History:  Diagnosis Date   Asthma    Atrioventricular block    Diabetes mellitus without complication ([place])    Fibromyalgia    Heart disease    Hypertension    Insomnia    Kidney disease     Kidney stones 01/12/1983   Major depression    Major depressive disorder, recurrent, mild ([place])    Mixed hyperlipidemia    Osteoporosis 01/27/2019   Other  amnesia    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[place])    Statin myopathy    Thyroid disease     Past Surgical History:  Procedure Laterality Date   ABDOMINAL HYSTERECTOMY  03/05/1998   pt does not think this was a total hysterectomy   APPENDECTOMY  12/31/1988   BREAST LUMPECTOMY Left 05/22/1996   pacemaker  09/27/2016   PACEMAKER REVISION  09/27/2020   PACEMAKER UPGRADE DUAL CHAMBER TO BIV    Family History  Problem Relation Age of Onset   High blood pressure Mother    Diabetes Mother        no medication needed   Kidney disease Mother    Pneumonia Mother    High blood pressure Father    Cancer Maternal Grandmother        Leukemia   Cancer Other        Ovarian    Social History:  reports that she has never smoked. She has never used smokeless tobacco. She reports that she does not drink alcohol and does not use drugs.  Review of Systems:  Hypertension:  blood pressure is treated with Coreg, amlodipine and, treated by other physicians  She we will periodically check her blood pressure at home  She has a history of CAD followed by cardiologist  Lipids: She has had statin intolerance with at least atorvastatin, simvastatin and rosuvastatin She was prescribed fluvastatin on the last visit in February but her PCP changed her to lovastatin Unclear when she started taking it but her last LDL in 7/22 was 89  Lab Results  Component Value Date   CHOL 178 02/21/2021   CHOL 220 (H) 10/22/2020   CHOL 224 (H) 05/20/2020   Lab Results  Component Value Date   HDL 74 02/21/2021   HDL 78 10/22/2020   HDL 69 05/20/2020   Lab Results  Component Value Date   LDLCALC 89 02/21/2021   LDLCALC 111 (H) 10/22/2020   LDLCALC 130 (H) 05/20/2020   Lab Results  Component Value Date   TRIG 85 02/21/2021   TRIG 182  (H) 10/22/2020   TRIG 141 05/20/2020   Lab Results  Component Value Date   CHOLHDL 2.4 02/21/2021   CHOLHDL 2.8 10/22/2020   CHOLHDL 3.2 05/20/2020   Lab Results  Component Value Date   LDLDIRECT 94.2 12/05/2013     THYROID:  She was previously on thyroid suppression because of her long-standing benign thyroid nodule.  She has had 2 biopsies done on this previously Because of low normal TSH her 50 mcg Synthroid was stopped in 10/14 TSH has been consistently normal subsequently Her exam does not show a nodule more recently  Her ultrasound last showed mostly cystic nodule in the right side about 1.6 cm  Lab Results  Component Value Date   TSH 1.64 03/23/2020   TSH 1.40 01/02/2019   TSH 1.26 09/05/2017   Last foot exam: 3/22    Examination:   BP 118/67   Pulse 67   Ht 5' 1"  (1.549 m)   Wt 110 lb 9.6 oz (50.2 kg)   SpO2 93%   BMI 20.90 kg/m   Body mass index is 20.9 kg/m.       ASSESSMENT/ PLAN:   Diabetes type 2 on metformin 500 mg twice daily monotherapy  Her blood sugars are well controlled A1c is still normal at 6 compared to 5.9  Although she had previously benefited from weight loss her blood sugars at home are trending  higher especially fasting This is without any change in diet or weight gain She may be a potential candidate for an SGLT2 drug and she is asking about [place] Discussed how this would work and will need to adjust her diuretics when starting this as well as coordinate treatment with cardiologist  For now we will recommend that she increase her evening Metformin to 1000 mg If she has any abdominal discomfort with this we will switch her to metformin ER She will talk to her cardiologist about starting Farxiga Urine microalbumin is to be checked today  Hypertension: Well-controlled  Renal function: Her last creatinine was stable last month  LIPIDS: She has CAD and is starting lovastatin and LDL appears to be improving   History of  thyroid nodule: History of mostly cystic small stable nodule which is nonpalpable, no further evaluation needed  Thyroid levels have been consistently normal and will recheck on the next visit  Follow-up in 4 months  Patient Instructions  Metformin 2 in pm, call if not tolerated   Elayne Snare 03/25/2021, 8:58 AM

## 2021-03-28 ENCOUNTER — Telehealth: Payer: Self-pay | Admitting: Endocrinology

## 2021-03-28 NOTE — Telephone Encounter (Signed)
Pt came 03/25/2021  for an app and pt husband stating that they have missed placed a pill bottle in the exam room . They also have went to the pharmacy to pick up test strips and there was not a code on the test strips so they could not get the prescription refilled.   ONETOUCH VERIO test strip   [facility] - [place], [place] - [address]

## 2021-03-29 ENCOUNTER — Other Ambulatory Visit: Payer: Self-pay

## 2021-03-29 MED ORDER — ONETOUCH VERIO VI STRP: 2.0000 | ORAL_STRIP | 1 refills | Status: DC | PRN

## 2021-03-30 DIAGNOSIS — M810 Age-related osteoporosis without current pathological fracture: Secondary | ICD-10-CM | POA: Diagnosis not present

## 2021-03-30 DIAGNOSIS — M85851 Other specified disorders of bone density and structure, right thigh: Secondary | ICD-10-CM | POA: Diagnosis not present

## 2021-03-30 DIAGNOSIS — N959 Unspecified menopausal and perimenopausal disorder: Secondary | ICD-10-CM | POA: Diagnosis not present

## 2021-03-30 NOTE — Telephone Encounter (Signed)
Spoke to verified -- found the medication bottle and refilled the test strip , sent to the pharmacy.

## 2021-04-07 ENCOUNTER — Other Ambulatory Visit: Payer: Self-pay

## 2021-04-07 DIAGNOSIS — E119 Type 2 diabetes mellitus without complications: Secondary | ICD-10-CM

## 2021-04-07 MED ORDER — ONETOUCH VERIO VI STRP: ORAL_STRIP | 1 refills | Status: DC

## 2021-04-07 NOTE — Progress Notes (Signed)
[facility]  [address] [place],  [place]  [address] [phone]  Clinic Day:  04/15/2021  Referring physician: Blane Ohara, MD  This document serves as a record of services personally performed by Weston Settle, MD. It was created on their behalf by [facility], a trained medical scribe. The creation of this record is based on the scribe's personal observations and the provider's statements to them.  HISTORY OF PRESENT ILLNESS:  The patient is a 80 y.o. female with with anemia secondary to chronic renal insufficiency.  However, her hemoglobin has been well above 10 to where no red cell shot therapy has been needed in quite some time.  Furthermore, the patient's renal function has improved over these past few years.  She comes in today to reassess her anemia.  Since her last visit, the patient has been doing okay.  As it pertains to her anemia, she denies having increased fatigue or any overt forms of blood loss which concern her for a declining hemoglobin.  PHYSICAL EXAM:  Blood pressure (!) 159/70, pulse 80, temperature 98.1 F (36.7 C), temperature source Oral, resp. rate (!) 24, weight 110 lb 6.4 oz (50.1 kg), SpO2 95 %. Wt Readings from Last 3 Encounters:  04/15/21 110 lb 6.4 oz (50.1 kg)  03/25/21 110 lb 9.6 oz (50.2 kg)  02/21/21 108 lb (49 kg)   Body mass index is 20.86 kg/m. Performance status (ECOG): 1 - Symptomatic but completely ambulatory Physical Exam Constitutional:      Appearance: Normal appearance. She is not ill-appearing.  HENT:     Mouth/Throat:     Mouth: Mucous membranes are moist.     Pharynx: Oropharynx is clear. No oropharyngeal exudate or posterior oropharyngeal erythema.  Cardiovascular:     Rate and Rhythm: Normal rate and regular rhythm.     Heart sounds: No murmur heard.   No friction rub. No gallop.  Pulmonary:     Effort: Pulmonary effort is normal. No respiratory distress.     Breath sounds: Normal  breath sounds. No wheezing, rhonchi or rales.  Abdominal:     General: Bowel sounds are normal. There is no distension.     Palpations: Abdomen is soft. There is no mass.     Tenderness: There is no abdominal tenderness.  Musculoskeletal:        General: No swelling.     Right lower leg: No edema.     Left lower leg: No edema.  Lymphadenopathy:     Cervical: No cervical adenopathy.     Upper Body:     Right upper body: No supraclavicular or axillary adenopathy.     Left upper body: No supraclavicular or axillary adenopathy.     Lower Body: No right inguinal adenopathy. No left inguinal adenopathy.  Skin:    General: Skin is warm.     Coloration: Skin is not jaundiced.     Findings: No rash.  Neurological:     General: No focal deficit present.     Mental Status: She is alert and oriented to person, place, and time. Mental status is at baseline.     Cranial Nerves: Cranial nerves are intact.  Psychiatric:        Mood and Affect: Mood normal.        Behavior: Behavior normal.        Thought Content: Thought content normal.    LABS:   CBC Latest Ref Rng & Units 04/15/2021 02/21/2021 10/22/2020  WBC - 7.9 6.8 7.9  Hemoglobin 12.0 - 16.0 12.3 12.4 12.8  Hematocrit 36 - 46 37 38.3 39.4  Platelets 150 - 399 160 160 160   CMP Latest Ref Rng & Units 04/15/2021 02/21/2021 10/22/2020  Glucose 65 - 99 mg/dL - 222(L) 798(X)  BUN 4 - 21 28(A) 32(H) 26  Creatinine 0.5 - 1.1 1.1 1.16(H) 0.99  Sodium 137 - 147 138 138 140  Potassium 3.4 - 5.3 4.2 4.7 4.2  Chloride 99 - 108 106 102 103  CO2 13 - 22 24(A) 20 22  Calcium 8.7 - 10.7 9.1 9.5 9.4  Total Protein 6.0 - 8.5 g/dL - 6.0 6.0  Total Bilirubin 0.0 - 1.2 mg/dL - 0.4 0.4  Alkaline Phos 25 - 125 103 118 114  AST 13 - 35 35 35 30  ALT 7 - 35 23 25 23      ASSESSMENT & PLAN:  Assessment/Plan:  A 80 y.o. female with anemia secondary to previous renal insufficiency.  Her hemoglobin remains ideal at 12.3 today.   Furthermore, her kidney  parameters are essentially normal.  Her anemia will continue to be followed conservatively.  As she is clinically doing well, I will see this patient back in 6 months for repeat clinical assessment.   The patient understands all the plans discussed today and is in agreement with them.    I, 72, am acting as scribe for Foye Deer, MD    I have reviewed this report as typed by the medical scribe, and it is complete and accurate.  Dequincy Weston Settle, MD

## 2021-04-08 DIAGNOSIS — Z950 Presence of cardiac pacemaker: Secondary | ICD-10-CM | POA: Diagnosis not present

## 2021-04-11 ENCOUNTER — Other Ambulatory Visit: Payer: Self-pay | Admitting: Endocrinology

## 2021-04-11 DIAGNOSIS — E119 Type 2 diabetes mellitus without complications: Secondary | ICD-10-CM

## 2021-04-15 ENCOUNTER — Encounter: Payer: Self-pay | Admitting: Oncology

## 2021-04-15 ENCOUNTER — Telehealth: Payer: Self-pay | Admitting: Oncology

## 2021-04-15 ENCOUNTER — Inpatient Hospital Stay: Payer: Medicare Other | Attending: Oncology | Admitting: Oncology

## 2021-04-15 ENCOUNTER — Other Ambulatory Visit: Payer: Self-pay | Admitting: Oncology

## 2021-04-15 ENCOUNTER — Inpatient Hospital Stay: Payer: Medicare Other

## 2021-04-15 VITALS — BP 159/70 | HR 80 | Temp 98.1°F | Resp 24 | Wt 110.4 lb

## 2021-04-15 DIAGNOSIS — D649 Anemia, unspecified: Secondary | ICD-10-CM | POA: Diagnosis not present

## 2021-04-15 DIAGNOSIS — N189 Chronic kidney disease, unspecified: Secondary | ICD-10-CM | POA: Diagnosis not present

## 2021-04-15 DIAGNOSIS — D631 Anemia in chronic kidney disease: Secondary | ICD-10-CM | POA: Diagnosis not present

## 2021-04-15 LAB — BASIC METABOLIC PANEL
BUN: 28 — AB (ref 4–21)
CO2: 24 — AB (ref 13–22)
Chloride: 106 (ref 99–108)
Creatinine: 1.1 (ref 0.5–1.1)
Glucose: 128
Potassium: 4.2 (ref 3.4–5.3)
Sodium: 138 (ref 137–147)

## 2021-04-15 LAB — CBC AND DIFFERENTIAL
HCT: 37 (ref 36–46)
Hemoglobin: 12.3 (ref 12.0–16.0)
Neutrophils Absolute: 5.69
Platelets: 160 (ref 150–399)
WBC: 7.9

## 2021-04-15 LAB — IRON AND TIBC
Iron: 60 ug/dL (ref 28–170)
Saturation Ratios: 14 % (ref 10.4–31.8)
TIBC: 434 ug/dL (ref 250–450)
UIBC: 374 ug/dL

## 2021-04-15 LAB — COMPREHENSIVE METABOLIC PANEL
Albumin: 3.6 (ref 3.5–5.0)
Calcium: 9.1 (ref 8.7–10.7)

## 2021-04-15 LAB — FERRITIN: Ferritin: 33 ng/mL (ref 11–307)

## 2021-04-15 LAB — HEPATIC FUNCTION PANEL
ALT: 23 (ref 7–35)
AST: 35 (ref 13–35)
Alkaline Phosphatase: 103 (ref 25–125)
Bilirubin, Total: 0.5

## 2021-04-15 LAB — CBC: RBC: 4.04 (ref 3.87–5.11)

## 2021-04-15 NOTE — Telephone Encounter (Signed)
Per 9/2 LOS next appt scheduled and given to patient 

## 2021-04-25 ENCOUNTER — Telehealth: Payer: Self-pay

## 2021-04-25 ENCOUNTER — Other Ambulatory Visit: Payer: Self-pay

## 2021-04-25 MED ORDER — RIVAROXABAN 15 MG PO TABS: 15.0000 mg | ORAL_TABLET | Freq: Every day | ORAL | 0 refills | Status: DC

## 2021-04-25 NOTE — Telephone Encounter (Signed)
-----   Message from Jacklynn Bue, LPN sent at 6/50/3546 12:01 PM EDT ----- Regarding: Xarelto patient assistance Good morning Morrie Sheldon.  Mr Mastrangelo brought in a letter for his wife from [employer] that they received in Nov 2021 approving them for patient assistance for Xarelto until 07/06/2021 by presenting a card to the drug store.  He said that the medication is getting too expensive -- Should there be a cost associated with it?  They have been paying at the [facility] pharmacy.  I have made a copy of the letter and can email it to you if you want to see it.  If I am understanding the letter correctly the card should cover the copay as well as any additional fees.  The phone number given in the letter to [employer] Patient Assistance is [phone].  Do you mind reaching out to see if you can help this patient?  I gave them samples to cover them until this gets resolved.  The ID# is [id number], RX Bin# [id number], RXGRP# [id number]  Thanks!! Creola Corn, LPN [facility]

## 2021-04-25 NOTE — Progress Notes (Signed)
Bridget [name] refill requested - #90/0 sent to [facility]

## 2021-04-25 NOTE — Chronic Care Management (AMB) (Signed)
    Chronic Care Management Pharmacy Assistant   Name: Sharilyn Geisinger  MRN: [id number] DOB: Dec 31, 1940  Reason for Encounter: [place] Reasons Drug Cost Resolution   Medications: Outpatient Encounter Medications as of 04/25/2021  Medication Sig   albuterol (VENTOLIN HFA) 108 (90 Base) MCG/ACT inhaler INHALE 1 TO 2 PUFFS BY MOUTH EVERY 4 HOURS AS NEEDED FOR WHEEZING   aspirin 81 MG chewable tablet Chew 81 mg by mouth in the morning.    Blood Glucose Monitoring Suppl (ONETOUCH VERIO FLEX SYSTEM) w/Device KIT Check sugar once daily   Budeson-Glycopyrrol-Formoterol (BREZTRI AEROSPHERE) 160-9-4.8 MCG/ACT AERO Inhale 2 puffs into the lungs 2 (two) times daily.   calcium-vitamin D (OSCAL WITH D) 500-200 MG-UNIT TABS tablet Take 1 tablet by mouth daily.   carvedilol (COREG) 25 MG tablet Take 25 mg by mouth 2 (two) times daily with a meal.   Cholecalciferol (VITAMIN D-3) 1000 UNITS CAPS Take 1,000 Units by mouth daily.   furosemide (LASIX) 20 MG tablet Take 1 tablet (20 mg total) by mouth daily. (Patient taking differently: Take 20 mg by mouth daily. Takes every other day unless increased swelling.)   Glucagon, rDNA, (GLUCAGON EMERGENCY) 1 MG KIT Inject 1 kit as directed once for 1 dose. If sugars < 70, if you cannot increase with oral glucose.   losartan (COZAAR) 25 MG tablet Take 25 mg by mouth daily.   lovastatin (MEVACOR) 40 MG tablet Take 40 mg by mouth at bedtime.   Magnesium 250 MG TABS Take 250 mg by mouth daily.   metFORMIN (GLUCOPHAGE) 500 MG tablet TAKE 1 TABLET BY MOUTH TWICE DAILY WITH MORNING MEAL AND WITH EVENING MEAL   Multiple Vitamins-Minerals (CENTRUM SILVER ULTRA WOMENS PO) Take by mouth daily.   nitroGLYCERIN (NITROSTAT) 0.4 MG SL tablet Place 0.4 mg under the tongue every 5 (five) minutes as needed for chest pain.   OneTouch Delica Lancets 98P MISC USE 1  TO CHECK GLUCOSE ONCE DAILY *APPOINTMENT  REQUIRED  FOR  FUTURE  REFILLS   ONETOUCH VERIO test strip Use to check blood sugar  once daily  DX CODE E11.9   polyethylene glycol (MIRALAX / GLYCOLAX) packet Take 17 g by mouth daily as needed.   venlafaxine XR (EFFEXOR-XR) 75 MG 24 hr capsule Take 1 capsule (75 mg total) by mouth daily.   XARELTO 15 MG TABS tablet Take 1 tablet by mouth once daily   No facility-administered encounter medications on file as of 04/25/2021.   I spoke with [employer]  and they informed me that the reason that Ms. Goelz has been having to pay for Xarelto is due to the billing method used at [facility]. She informed me that they would not be able to issue any reimbursements because it was an external cause.  I called and spoke with [facility] and informed them of the issue. Upon investigation it was discovered that Xarelto was billed to insurance rather than to the manufacturer. [facility] agreed to issue a refund and also requested a new prescription as the existing one had no refills remaining.  Mr. Amara was made aware of the situation and will be using the samples given to them by LPN Tamala Julian until the Xarelto is available for pickup. Message was sent to LPN to request a new prescription.  [employer] CPA, CMA

## 2021-04-26 ENCOUNTER — Ambulatory Visit: Payer: Medicare Other | Admitting: Family Medicine

## 2021-04-26 DIAGNOSIS — J449 Chronic obstructive pulmonary disease, unspecified: Secondary | ICD-10-CM | POA: Diagnosis not present

## 2021-04-26 DIAGNOSIS — I272 Pulmonary hypertension, unspecified: Secondary | ICD-10-CM | POA: Diagnosis not present

## 2021-04-26 DIAGNOSIS — I471 Supraventricular tachycardia: Secondary | ICD-10-CM | POA: Diagnosis not present

## 2021-04-26 DIAGNOSIS — J9601 Acute respiratory failure with hypoxia: Secondary | ICD-10-CM | POA: Diagnosis not present

## 2021-04-26 DIAGNOSIS — E119 Type 2 diabetes mellitus without complications: Secondary | ICD-10-CM | POA: Diagnosis not present

## 2021-04-26 DIAGNOSIS — J80 Acute respiratory distress syndrome: Secondary | ICD-10-CM | POA: Diagnosis not present

## 2021-04-26 DIAGNOSIS — R0902 Hypoxemia: Secondary | ICD-10-CM | POA: Diagnosis not present

## 2021-04-26 DIAGNOSIS — R062 Wheezing: Secondary | ICD-10-CM | POA: Diagnosis not present

## 2021-04-26 DIAGNOSIS — Z7982 Long term (current) use of aspirin: Secondary | ICD-10-CM | POA: Diagnosis not present

## 2021-04-26 DIAGNOSIS — Z7984 Long term (current) use of oral hypoglycemic drugs: Secondary | ICD-10-CM | POA: Diagnosis not present

## 2021-04-26 DIAGNOSIS — I5020 Unspecified systolic (congestive) heart failure: Secondary | ICD-10-CM | POA: Diagnosis not present

## 2021-04-26 DIAGNOSIS — F32A Depression, unspecified: Secondary | ICD-10-CM | POA: Diagnosis not present

## 2021-04-26 DIAGNOSIS — J811 Chronic pulmonary edema: Secondary | ICD-10-CM | POA: Diagnosis not present

## 2021-04-26 DIAGNOSIS — I5089 Other heart failure: Secondary | ICD-10-CM | POA: Diagnosis not present

## 2021-04-26 DIAGNOSIS — N179 Acute kidney failure, unspecified: Secondary | ICD-10-CM | POA: Diagnosis not present

## 2021-04-26 DIAGNOSIS — I5023 Acute on chronic systolic (congestive) heart failure: Secondary | ICD-10-CM | POA: Diagnosis not present

## 2021-04-26 DIAGNOSIS — I10 Essential (primary) hypertension: Secondary | ICD-10-CM | POA: Diagnosis not present

## 2021-04-26 DIAGNOSIS — Z79899 Other long term (current) drug therapy: Secondary | ICD-10-CM | POA: Diagnosis not present

## 2021-04-26 DIAGNOSIS — R0689 Other abnormalities of breathing: Secondary | ICD-10-CM | POA: Diagnosis not present

## 2021-04-26 DIAGNOSIS — Z20822 Contact with and (suspected) exposure to covid-19: Secondary | ICD-10-CM | POA: Diagnosis not present

## 2021-04-26 DIAGNOSIS — Z950 Presence of cardiac pacemaker: Secondary | ICD-10-CM | POA: Diagnosis not present

## 2021-04-26 DIAGNOSIS — Z86718 Personal history of other venous thrombosis and embolism: Secondary | ICD-10-CM | POA: Diagnosis not present

## 2021-04-26 DIAGNOSIS — R0602 Shortness of breath: Secondary | ICD-10-CM | POA: Diagnosis not present

## 2021-04-26 DIAGNOSIS — Z7901 Long term (current) use of anticoagulants: Secondary | ICD-10-CM | POA: Diagnosis not present

## 2021-04-26 DIAGNOSIS — I083 Combined rheumatic disorders of mitral, aortic and tricuspid valves: Secondary | ICD-10-CM | POA: Diagnosis not present

## 2021-04-27 DIAGNOSIS — J9601 Acute respiratory failure with hypoxia: Secondary | ICD-10-CM | POA: Diagnosis not present

## 2021-04-27 DIAGNOSIS — I509 Heart failure, unspecified: Secondary | ICD-10-CM | POA: Diagnosis not present

## 2021-04-27 DIAGNOSIS — I82A19 Acute embolism and thrombosis of unspecified axillary vein: Secondary | ICD-10-CM | POA: Diagnosis not present

## 2021-04-27 DIAGNOSIS — I5020 Unspecified systolic (congestive) heart failure: Secondary | ICD-10-CM | POA: Diagnosis not present

## 2021-04-27 DIAGNOSIS — N179 Acute kidney failure, unspecified: Secondary | ICD-10-CM | POA: Diagnosis not present

## 2021-04-27 DIAGNOSIS — R2241 Localized swelling, mass and lump, right lower limb: Secondary | ICD-10-CM | POA: Diagnosis not present

## 2021-04-27 DIAGNOSIS — Z7901 Long term (current) use of anticoagulants: Secondary | ICD-10-CM | POA: Diagnosis not present

## 2021-04-27 DIAGNOSIS — R0602 Shortness of breath: Secondary | ICD-10-CM | POA: Diagnosis not present

## 2021-04-27 DIAGNOSIS — J449 Chronic obstructive pulmonary disease, unspecified: Secondary | ICD-10-CM | POA: Diagnosis not present

## 2021-04-27 DIAGNOSIS — E119 Type 2 diabetes mellitus without complications: Secondary | ICD-10-CM | POA: Diagnosis not present

## 2021-04-29 ENCOUNTER — Telehealth: Payer: Self-pay

## 2021-04-29 ENCOUNTER — Other Ambulatory Visit: Payer: Self-pay | Admitting: Physician Assistant

## 2021-04-29 DIAGNOSIS — I5023 Acute on chronic systolic (congestive) heart failure: Secondary | ICD-10-CM | POA: Diagnosis not present

## 2021-04-29 DIAGNOSIS — Z79899 Other long term (current) drug therapy: Secondary | ICD-10-CM | POA: Diagnosis not present

## 2021-04-29 DIAGNOSIS — N179 Acute kidney failure, unspecified: Secondary | ICD-10-CM | POA: Diagnosis not present

## 2021-04-29 NOTE — Chronic Care Management (AMB) (Signed)
Chronic Care Management Pharmacy Assistant   Name: Bridget [name]  MRN: [id number] DOB: 1940-09-20   Reason for Encounter: Disease State call for DM and to reschedule 05/02/21 appt    Recent office visits:  None since 02/28/21  Recent consult visits:  04/15/21 Lavera Guise MD (Oncology) Anemia of Chronic Renal Failure. No med changes. Follow up in 6 months 03/25/21 Elayne Snare MD (Endocringology) DM. Labs ordered. "For now we will recommend that she increase her evening Metformin to 1000 mg". Follow up in 4 months.  Hospital visits:  None since 02/28/21  Medications: Outpatient Encounter Medications as of 04/29/2021  Medication Sig   albuterol (VENTOLIN HFA) 108 (90 Base) MCG/ACT inhaler INHALE 1 TO 2 PUFFS BY MOUTH EVERY 4 HOURS AS NEEDED FOR WHEEZING   aspirin 81 MG chewable tablet Chew 81 mg by mouth in the morning.    Blood Glucose Monitoring Suppl (ONETOUCH VERIO FLEX SYSTEM) w/Device KIT Check sugar once daily   Budeson-Glycopyrrol-Formoterol (BREZTRI AEROSPHERE) 160-9-4.8 MCG/ACT AERO Inhale 2 puffs into the lungs 2 (two) times daily.   calcium-vitamin D (OSCAL WITH D) 500-200 MG-UNIT TABS tablet Take 1 tablet by mouth daily.   carvedilol (COREG) 25 MG tablet Take 25 mg by mouth 2 (two) times daily with a meal.   Cholecalciferol (VITAMIN D-3) 1000 UNITS CAPS Take 1,000 Units by mouth daily.   furosemide (LASIX) 20 MG tablet Take 1 tablet (20 mg total) by mouth daily. (Patient taking differently: Take 20 mg by mouth daily. Takes every other day unless increased swelling.)   Glucagon, rDNA, (GLUCAGON EMERGENCY) 1 MG KIT Inject 1 kit as directed once for 1 dose. If sugars < 70, if you cannot increase with oral glucose.   losartan (COZAAR) 25 MG tablet Take 25 mg by mouth daily.   lovastatin (MEVACOR) 40 MG tablet Take 40 mg by mouth at bedtime.   Magnesium 250 MG TABS Take 250 mg by mouth daily.   metFORMIN (GLUCOPHAGE) 500 MG tablet TAKE 1 TABLET BY MOUTH TWICE DAILY WITH  MORNING MEAL AND WITH EVENING MEAL   Multiple Vitamins-Minerals (CENTRUM SILVER ULTRA WOMENS PO) Take by mouth daily.   nitroGLYCERIN (NITROSTAT) 0.4 MG SL tablet Place 0.4 mg under the tongue every 5 (five) minutes as needed for chest pain.   OneTouch Delica Lancets 08X MISC USE 1  TO CHECK GLUCOSE ONCE DAILY *APPOINTMENT  REQUIRED  FOR  FUTURE  REFILLS   ONETOUCH VERIO test strip Use to check blood sugar once daily  DX CODE E11.9   polyethylene glycol (MIRALAX / GLYCOLAX) packet Take 17 g by mouth daily as needed.   Rivaroxaban (XARELTO) 15 MG TABS tablet Take 1 tablet (15 mg total) by mouth daily.   venlafaxine XR (EFFEXOR-XR) 75 MG 24 hr capsule Take 1 capsule (75 mg total) by mouth daily.   No facility-administered encounter medications on file as of 04/29/2021.   Recent Relevant Labs: Lab Results  Component Value Date/Time   HGBA1C 6.6 (A) 03/25/2021 08:29 AM   HGBA1C 6.7 (H) 10/22/2020 09:01 AM   HGBA1C 6.0 (A) 09/24/2020 09:30 AM   HGBA1C 6.3 (H) 11/20/2019 09:31 AM   MICROALBUR 4.0 (H) 03/25/2021 08:59 AM   MICROALBUR <0.7 03/23/2020 10:49 AM    Kidney Function Lab Results  Component Value Date/Time   CREATININE 1.1 04/15/2021 12:00 AM   CREATININE 1.16 (H) 02/21/2021 09:19 AM   CREATININE 0.99 10/22/2020 09:01 AM   GFR 32.91 (L) 03/23/2020 08:52 AM   GFRNONAA 60  09/08/2020 02:42 PM   GFRAA 69 09/08/2020 02:42 PM     Current antihyperglycemic regimen:  metformin 500 mg morning and 1000 mg evening meals Glucagon 1 mg as directed (pt stated she has none on hand )  Patient verbally confirms she is taking the above medications as directed. Yes  What recent interventions/DTPs have been made to improve glycemic control:  Pt states nothing has changed and her sugars have been well controled  Have there been any recent hospitalizations or ED visits since last visit with CPP? Pt stated she had to call 911 on 04/26/21 due to being unable to breathe. The EMS took her to [facility]. She was discharged on 04/27/21. Pt stated they said she had fluid on her lungs. The ED did change some of her meds as follows: Changed Carvedilol 25 mg twice daily, Furosemide 20 mg to 40 mg in morning only. Losartan 25 mg to 50 mg take in morning. Xarelto 15 mg every other day.  Patient denies hypoglycemic symptoms, including None  Patient reports hyperglycemic symptoms, including weakness  How often are you checking your blood sugar? Pt is checking sugars morning or evening   What are your blood sugars ranging? Pt states her range is 98-140. Fasting: 04/29/21 (110 ) 04/14/21 130 in morning and 230 in evening  before dinner    On insulin? Pt is not on insulin   During the week, how often does your blood glucose drop below 70? Pt stated her sugars never drop below 70. They are normally higher.   Are you checking your feet daily/regularly? Pt states she usually checks her feet for swelling every mornings  Adherence Review: Is the patient currently on a STATIN medication? Yes Is the patient currently on ACE/ARB medication? Yes Does the patient have >5 day gap between last estimated fill dates? CPP to review  Care Gaps: Last eye exam / Retinopathy Screening? Next due on 01/12/22 Last Annual Wellness Visit? Last visit was on 01/13/21 Last Diabetic Foot Exam? Next due on 10/22/21   Star Rating Drugs:  Metformin 500 mg last filled on 04/13/21 [facility] Lovastatin 40 mg last filled on 11/01/20 [facility] Losartan 25 mg last filled on 04/27/21 [place]

## 2021-05-02 ENCOUNTER — Telehealth: Payer: Medicare Other

## 2021-05-03 ENCOUNTER — Ambulatory Visit (INDEPENDENT_AMBULATORY_CARE_PROVIDER_SITE_OTHER): Payer: Medicare Other | Admitting: Family Medicine

## 2021-05-03 ENCOUNTER — Other Ambulatory Visit: Payer: Self-pay

## 2021-05-03 DIAGNOSIS — I509 Heart failure, unspecified: Secondary | ICD-10-CM | POA: Diagnosis not present

## 2021-05-03 DIAGNOSIS — J9601 Acute respiratory failure with hypoxia: Secondary | ICD-10-CM

## 2021-05-03 DIAGNOSIS — R0602 Shortness of breath: Secondary | ICD-10-CM | POA: Diagnosis not present

## 2021-05-03 DIAGNOSIS — I9589 Other hypotension: Secondary | ICD-10-CM | POA: Diagnosis not present

## 2021-05-03 NOTE — Progress Notes (Signed)
Subjective:  Patient ID: Bridget [name], female    DOB: 01/13/1941  Age: 80 y.o. MRN: [id number]  Chief Complaint  Patient presents with   Hospitalization Follow-up    HPI   Patient was seen at [facility] for [place] on 04/26/2021 and discharged 9/14. Pt has pmhx asthma, atrial tachy/CHF (EF 20-25%0, axillary DVT while on xrelto, copd, and diabetes with acute sob. She had increasing dyspnea, orthopnea. The patient had sigificant desaturations and bp 180s/110s. Pt was given bronchodilators, ntg, cpap. CXR showed pulmonary edema. Pt was given iv diuretics. She improved and required less oxygen until it was no longer needed. Repeat echocardiogram: severe global hypokinesesis of the LF with dilatation, EF 20-25% (may be slightly less EF than in 2021.)  Ultrasound of RLE negative for DVT. Final diagnosis: acute hypoxic respiratory failure secondary to pulmonary edema, CHF exacerbation, AKI on CKD. Patient was discharged on increased dose of coreg 25 mg one twice a day, increased furosemide to 40 mg daily, and increase losartan to 50 mg daily. Her xarelto was decreased to 15 mg daily. Patient has continued to check daily weights, bps, and pulse. She also had aki on ckd. BP taken this morning was 78/50, pulse 81. 106/57 and then 98/50 later during the day. BP 114/58. Weight has dropped 106 to 103.    Patient is feeling much better. Not sob. Did not feel lightheaded this morning.  Current Outpatient Medications on File Prior to Visit  Medication Sig Dispense Refill   albuterol (VENTOLIN HFA) 108 (90 Base) MCG/ACT inhaler INHALE 1 TO 2 PUFFS BY MOUTH EVERY 4 HOURS AS NEEDED FOR WHEEZING 9 g 0   aspirin 81 MG chewable tablet Chew 81 mg by mouth in the morning.      Blood Glucose Monitoring Suppl (ONETOUCH VERIO FLEX SYSTEM) w/Device KIT Check sugar once daily 1 kit 0   Budeson-Glycopyrrol-Formoterol (BREZTRI AEROSPHERE) 160-9-4.8 MCG/ACT AERO Inhale 2 puffs into the lungs 2 (two) times daily.  10.7 g 5   calcium-vitamin D (OSCAL WITH D) 500-200 MG-UNIT TABS tablet Take 1 tablet by mouth daily.     carvedilol (COREG) 25 MG tablet Take 25 mg by mouth 2 (two) times daily with a meal.     Cholecalciferol (VITAMIN D-3) 1000 UNITS CAPS Take 1,000 Units by mouth daily.     furosemide (LASIX) 20 MG tablet Take 1 tablet (20 mg total) by mouth daily. (Patient taking differently: Take 40 mg by mouth daily.) 30 tablet 0   Glucagon, rDNA, (GLUCAGON EMERGENCY) 1 MG KIT Inject 1 kit as directed once for 1 dose. If sugars < 70, if you cannot increase with oral glucose. 1 kit 1   losartan (COZAAR) 50 MG tablet Take 50 mg by mouth daily.     lovastatin (MEVACOR) 40 MG tablet Take 40 mg by mouth at bedtime.     Magnesium 250 MG TABS Take 250 mg by mouth daily.     metFORMIN (GLUCOPHAGE) 500 MG tablet TAKE 1 TABLET BY MOUTH TWICE DAILY WITH MORNING MEAL AND WITH EVENING MEAL 180 tablet 0   Multiple Vitamins-Minerals (CENTRUM SILVER ULTRA WOMENS PO) Take by mouth daily.     nitroGLYCERIN (NITROSTAT) 0.4 MG SL tablet Place 0.4 mg under the tongue every 5 (five) minutes as needed for chest pain.     OneTouch Delica Lancets 34J MISC USE 1  TO CHECK GLUCOSE ONCE DAILY *APPOINTMENT  REQUIRED  FOR  FUTURE  REFILLS 100 each 3   ONETOUCH VERIO test  strip Use to check blood sugar once daily  DX CODE E11.9 100 each 1   polyethylene glycol (MIRALAX / GLYCOLAX) packet Take 17 g by mouth daily as needed.     Rivaroxaban (XARELTO) 15 MG TABS tablet Take 1 tablet (15 mg total) by mouth daily. (Patient taking differently: Take 15 mg by mouth daily. Take e/o day) 90 tablet 0   venlafaxine XR (EFFEXOR-XR) 75 MG 24 hr capsule Take 1 capsule (75 mg total) by mouth daily. 90 capsule 0   No current facility-administered medications on file prior to visit.   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[place])    Statin myopathy    Thyroid disease    Past Surgical History:  Procedure Laterality Date   ABDOMINAL HYSTERECTOMY  03/05/1998   pt does not think this was a total hysterectomy   APPENDECTOMY  12/31/1988   BREAST LUMPECTOMY Left 05/22/1996   pacemaker  09/27/2016   PACEMAKER REVISION  09/27/2020   PACEMAKER UPGRADE DUAL CHAMBER TO BIV    Family History  Problem Relation Age of Onset   High blood pressure Mother    Diabetes Mother        no medication needed   Kidney disease Mother    Pneumonia Mother    High blood pressure Father    Cancer Maternal Grandmother        Leukemia   Cancer Other        Ovarian   Social History   Socioeconomic History   Marital status: Married    Spouse name: Darryl   Number of children: 2   Years of education: Not on file   Highest education level: 9th grade  Occupational History   Occupation: Retired  Tobacco Use   Smoking status: Never   Smokeless tobacco: Never  Vaping Use   Vaping Use: Never used  Substance and Sexual Activity   Alcohol use: Never   Drug use: Never   Sexual activity: Not on file  Other Topics Concern   Not on file  Social History Narrative   Lives at home with her husband   Right handed   Caffeine: mostly tea, 0-2 cups a day   Social Determinants of Health   Financial Resource Strain: Low Risk    Difficulty of Paying Living Expenses: Not hard at all  Food Insecurity: No Food Insecurity   Worried About Charity fundraiser in the Last Year: Never true   Arboriculturist in the Last Year: Never true  Transportation Needs: No Transportation Needs   Lack of Transportation (Medical): No   Lack of Transportation (Non-Medical): No  Physical  Activity: Not on file  Stress: Not on file  Social Connections: Not on file    Review of Systems  Constitutional:  Positive for diaphoresis (night sweats). Negative for chills, fatigue and fever.  HENT:  Negative for congestion (occassionally), ear pain, rhinorrhea and sore throat.   Respiratory:  Positive for cough. Negative for shortness of breath.   Cardiovascular:  Negative  for chest pain.  Gastrointestinal:  Negative for abdominal pain, constipation, diarrhea, nausea and vomiting.  Genitourinary:  Negative for dysuria and urgency.  Musculoskeletal:  Positive for back pain. Negative for arthralgias and myalgias.  Neurological:  Negative for dizziness (vertigo), weakness, light-headedness and headaches.  Psychiatric/Behavioral:  Negative for dysphoric mood. The patient is not nervous/anxious.     Objective:  BP (!) 114/58   Pulse 78   Temp (!) 96.5 F (35.8 C)   Ht 5' 1"  (1.549 m)   Wt 106 lb (48.1 kg)   SpO2 98%   BMI 20.03 kg/m   BP/Weight 05/03/2021 04/15/2021 12/31/8020  Systolic BP 336 122 449  Diastolic BP 58 70 67  Wt. (Lbs) 106 110.4 110.6  BMI 20.03 20.86 20.9    Physical Exam Vitals reviewed.  Constitutional:      Appearance: Normal appearance.     Comments: Thin  Neck:     Vascular: No carotid bruit.  Cardiovascular:     Rate and Rhythm: Normal rate and regular rhythm.     Pulses: Normal pulses.     Heart sounds: Normal heart sounds.  Pulmonary:     Effort: Pulmonary effort is normal. No respiratory distress.     Breath sounds: Normal breath sounds.  Abdominal:     General: Abdomen is flat. Bowel sounds are normal.     Palpations: Abdomen is soft.     Tenderness: There is no abdominal tenderness.  Neurological:     Mental Status: She is alert and oriented to person, place, and time.  Psychiatric:        Mood and Affect: Mood normal.        Behavior: Behavior normal.    Diabetic Foot Exam - Simple   No data filed      Lab Results  Component  Value Date   WBC 7.9 04/15/2021   HGB 12.3 04/15/2021   HCT 37 04/15/2021   PLT 160 04/15/2021   GLUCOSE 121 (H) 05/03/2021   CHOL 178 02/21/2021   TRIG 85 02/21/2021   HDL 74 02/21/2021   LDLDIRECT 94.2 12/05/2013   LDLCALC 89 02/21/2021   ALT 31 05/03/2021   AST 39 05/03/2021   NA 136 05/03/2021   K 4.2 05/03/2021   CL 94 (L) 05/03/2021   CREATININE 1.33 (H) 05/03/2021   BUN 42 (H) 05/03/2021   CO2 24 05/03/2021   TSH 1.64 03/23/2020   INR 1.3 (H) 11/12/2020   HGBA1C 6.6 (A) 03/25/2021   MICROALBUR 4.0 (H) 03/25/2021      Assessment & Plan:   Problem List Items Addressed This Visit       Cardiovascular and Mediastinum   Acute congestive heart failure (HCC)    Exacerbation resolved.  Continue to weigh daily.       Relevant Orders   Comprehensive metabolic panel (Completed)   Hypotension, unspecified    Friday 9:30 am at [facility].  Recommend losartan 25 mg one twice daily.  Recommend continue carvedilol 25 mg one twice daily.  Continue Lasix 40 mg once in am.   Keep log of weight, blood pressure and pulse.         Respiratory   Acute respiratory failure with hypoxia (HCC)    resolved        Other   SOB (shortness of breath)    Improved.      Hypomagnesemia - Primary    Check magnesium.      Relevant Orders   Magnesium (  Completed)  .  No orders of the defined types were placed in this encounter.   Orders Placed This Encounter  Procedures   Magnesium   Comprehensive metabolic panel      Follow-up: No follow-ups on file.  An After Visit Summary was printed and given to the patient.  [name], MD [facility] [phone]

## 2021-05-03 NOTE — Patient Instructions (Signed)
Friday 9:30 am at [facility].  Recommend losartan 25 mg one twice daily.  Recommend continue carvedilol 25 mg one twice daily.  Continue Lasix 40 mg once in am.   Keep log of weight, blood pressure and pulse.  

## 2021-05-04 ENCOUNTER — Encounter: Payer: Self-pay | Admitting: Family Medicine

## 2021-05-04 DIAGNOSIS — I959 Hypotension, unspecified: Secondary | ICD-10-CM | POA: Insufficient documentation

## 2021-05-04 DIAGNOSIS — R0602 Shortness of breath: Secondary | ICD-10-CM | POA: Insufficient documentation

## 2021-05-04 DIAGNOSIS — J9601 Acute respiratory failure with hypoxia: Secondary | ICD-10-CM | POA: Insufficient documentation

## 2021-05-04 LAB — COMPREHENSIVE METABOLIC PANEL
ALT: 31 IU/L (ref 0–32)
AST: 39 IU/L (ref 0–40)
Albumin/Globulin Ratio: 1.7 (ref 1.2–2.2)
Albumin: 4 g/dL (ref 3.7–4.7)
Alkaline Phosphatase: 126 IU/L — ABNORMAL HIGH (ref 44–121)
BUN/Creatinine Ratio: 32 — ABNORMAL HIGH (ref 12–28)
BUN: 42 mg/dL — ABNORMAL HIGH (ref 8–27)
Bilirubin Total: 0.4 mg/dL (ref 0.0–1.2)
CO2: 24 mmol/L (ref 20–29)
Calcium: 9.6 mg/dL (ref 8.7–10.3)
Chloride: 94 mmol/L — ABNORMAL LOW (ref 96–106)
Creatinine, Ser: 1.33 mg/dL — ABNORMAL HIGH (ref 0.57–1.00)
Globulin, Total: 2.3 g/dL (ref 1.5–4.5)
Glucose: 121 mg/dL — ABNORMAL HIGH (ref 65–99)
Potassium: 4.2 mmol/L (ref 3.5–5.2)
Sodium: 136 mmol/L (ref 134–144)
Total Protein: 6.3 g/dL (ref 6.0–8.5)
eGFR: 41 mL/min/{1.73_m2} — ABNORMAL LOW (ref >59)

## 2021-05-04 LAB — MAGNESIUM: Magnesium: 1.7 mg/dL (ref 1.6–2.3)

## 2021-05-04 NOTE — Assessment & Plan Note (Signed)
resolved 

## 2021-05-04 NOTE — Assessment & Plan Note (Signed)
Friday 9:30 am at [facility].  Recommend losartan 25 mg one twice daily.  Recommend continue carvedilol 25 mg one twice daily.  Continue Lasix 40 mg once in am.   Keep log of weight, blood pressure and pulse.

## 2021-05-04 NOTE — Assessment & Plan Note (Signed)
Check magnesium. 

## 2021-05-04 NOTE — Assessment & Plan Note (Signed)
Exacerbation resolved.  Continue to weigh daily.

## 2021-05-04 NOTE — Assessment & Plan Note (Signed)
Improved

## 2021-05-06 DIAGNOSIS — Z86718 Personal history of other venous thrombosis and embolism: Secondary | ICD-10-CM | POA: Diagnosis not present

## 2021-05-06 DIAGNOSIS — Z45018 Encounter for adjustment and management of other part of cardiac pacemaker: Secondary | ICD-10-CM | POA: Diagnosis not present

## 2021-05-06 DIAGNOSIS — Z9889 Other specified postprocedural states: Secondary | ICD-10-CM | POA: Diagnosis not present

## 2021-05-06 DIAGNOSIS — I428 Other cardiomyopathies: Secondary | ICD-10-CM | POA: Insufficient documentation

## 2021-05-06 DIAGNOSIS — I10 Essential (primary) hypertension: Secondary | ICD-10-CM | POA: Diagnosis not present

## 2021-05-06 DIAGNOSIS — E782 Mixed hyperlipidemia: Secondary | ICD-10-CM | POA: Diagnosis not present

## 2021-05-17 ENCOUNTER — Other Ambulatory Visit: Payer: Self-pay | Admitting: Family Medicine

## 2021-05-17 NOTE — Telephone Encounter (Signed)
Refill sent to pharmacy.   

## 2021-05-23 DIAGNOSIS — E119 Type 2 diabetes mellitus without complications: Secondary | ICD-10-CM | POA: Diagnosis not present

## 2021-05-23 DIAGNOSIS — H524 Presbyopia: Secondary | ICD-10-CM | POA: Diagnosis not present

## 2021-05-23 DIAGNOSIS — H25813 Combined forms of age-related cataract, bilateral: Secondary | ICD-10-CM | POA: Diagnosis not present

## 2021-05-23 DIAGNOSIS — H2513 Age-related nuclear cataract, bilateral: Secondary | ICD-10-CM | POA: Diagnosis not present

## 2021-05-23 LAB — HM DIABETES EYE EXAM

## 2021-05-26 ENCOUNTER — Ambulatory Visit: Payer: Medicare Other | Admitting: Family Medicine

## 2021-05-30 ENCOUNTER — Ambulatory Visit (INDEPENDENT_AMBULATORY_CARE_PROVIDER_SITE_OTHER): Payer: Medicare Other

## 2021-05-30 ENCOUNTER — Other Ambulatory Visit: Payer: Self-pay

## 2021-05-30 DIAGNOSIS — I10 Essential (primary) hypertension: Secondary | ICD-10-CM

## 2021-05-30 DIAGNOSIS — I9589 Other hypotension: Secondary | ICD-10-CM

## 2021-05-30 DIAGNOSIS — E782 Mixed hyperlipidemia: Secondary | ICD-10-CM

## 2021-05-30 DIAGNOSIS — I509 Heart failure, unspecified: Secondary | ICD-10-CM

## 2021-05-30 DIAGNOSIS — F330 Major depressive disorder, recurrent, mild: Secondary | ICD-10-CM

## 2021-05-30 NOTE — Patient Instructions (Signed)
Visit Information   Goals Addressed   None    Patient Care Plan: CCM Pharmacy Care Plan     Problem Identified: dm, chf, hld   Priority: High  Onset Date: 10/25/2020     Long-Range Goal: Disease State Management   Start Date: 10/25/2020  Expected End Date: 10/25/2021  Recent Progress: On track  Priority: High  Note:   Current Barriers:  Unable to independently afford treatment regimen  Pharmacist Clinical Goal(s):  Over the next 90 days, patient will verbalize ability to afford treatment regimen through collaboration with PharmD and provider.   Interventions: 1:1 collaboration with [name], MD regarding development and update of comprehensive plan of care as evidenced by provider attestation and co-signature Inter-disciplinary care team collaboration (see longitudinal plan of care) Comprehensive medication review performed; medication list updated in electronic medical record  Hypertension (BP goal <130/80) BP Readings from Last 3 Encounters:  05/03/21 (!) 114/58  04/15/21 (!) 159/70  03/25/21 118/67  -Controlled -Current treatment: Carvedilol 25 mg  1 tablet mg bid Losartan 25 mg daily  -Medications previously tried:   -Current home readings: today is 105/59 mmHg. Had patient recheck and pharmacist calling back to determine updated blood pressure.  -Current dietary habits: eats healthy overall. Meat and vegetables mainly. Currently putting up fresh garden vegetables.  -Current exercise habits: walking some -Denies hypotensive/hypertensive symptoms -Educated on Daily salt intake goal < 2300 mg; Exercise goal of 150 minutes per week; Importance of home blood pressure monitoring; -Counseled to monitor BP at home daily, document, and provide log at future appointments -Counseled on diet and exercise extensively Recommended to continue current medication  Diabetes (A1c goal <7%) Lab Results  Component Value Date   HGBA1C 6.6 (A) 03/25/2021   HGBA1C 6.7 (H)  10/22/2020   HGBA1C 6.0 (A) 09/24/2020   Lab Results  Component Value Date   MICROALBUR 4.0 (H) 03/25/2021   LDLCALC 89 02/21/2021   CREATININE 1.33 (H) 05/03/2021   Lab Results  Component Value Date   NA 136 05/03/2021   K 4.2 05/03/2021   CREATININE 1.33 (H) 05/03/2021   EGFR 41 (L) 05/03/2021   GFRNONAA 60 09/08/2020   GLUCOSE 121 (H) 05/03/2021   Lab Results  Component Value Date   WBC 7.9 04/15/2021   HGB 12.3 04/15/2021   HCT 37 04/15/2021   MCV 91 02/21/2021   PLT 160 04/15/2021  -Controlled -Current medications: metformin 500 mg morning and 500 mg evening meals Glucagon 1 mg as directed -Medications previously tried: pioglitazone  -Current home glucose readings fasting glucose: 87, 121, 164, 128, 144  post prandial glucose: not taking  -Denies hypoglycemic/hyperglycemic symptoms -Current meal patterns:  Patient reports healthy diet and mainly eats at home.  -Current exercise: walking  -Educated on A1c and blood sugar goals; Complications of diabetes including kidney damage, retinal damage, and cardiovascular disease; Exercise goal of 150 minutes per week; -Counseled to check feet daily and get yearly eye exams -Counseled on diet and exercise extensively Counseled on considering SGLT2 medication for blood sugar, chf and kidney benefits per endocrinology recommendation.  Plan: At goal,  patient stable/ symptoms controlled   Heart Failure (Goal: manage symptoms and prevent exacerbations) -Controlled -Last ejection fraction: 20-25% (Date: 07/2020) -HF type: Diastolic -NYHA Class: III (marked limitation of activity) -Current treatment: carvedilol 25 mg bid  Furosemide 20 mg every other day unless swelling increases Losartan 25 mg daily  Nitroglycerin 0.4 mg sl every 5 minutes prn chest pain -Medications previously tried: none reported -  Current home BP/HR readings: well controlled currently -Current dietary habits: eating healthy diet. Meat and vegetables.   -Current exercise habits: walking more and working to increase stamina since pacemaker updated 09/27/2020.  -Educated on Benefits of medications for managing symptoms and prolonging life Importance of blood pressure control -Counseled on diet and exercise extensively Recommended to continue current medication Recommended considering Farxiga.   Hyperlipidemia: (LDL goal < 55) -Uncontrolled -Current treatment: lovastatin 40 mg daily at bedtime  Aspirin 81 mg daily  -Medications previously tried: atorvastatin, rosuvastatin, simvastatin, fluvastatin -Current dietary patterns: overall healthy diet.  -Current exercise habits: walking more and working to rebuild stamina.  -Educated on Cholesterol goals;  Benefits of statin for ASCVD risk reduction; Importance of limiting foods high in cholesterol; Exercise goal of 150 minutes per week; -Counseled on diet and exercise extensively Recommended to continue current medication  Anticoagulation -Controlled -Current treatment: Xarelto 57m October 2022: PAP due to be renewed 07/06/21, will complete  COPD -Controlled -Current treatment: Breztri October 2022: PAP due to be renewed 07/06/21, will complete    Patient Goals/Self-Care Activities Over the next 90 days, patient will:  - take medications as prescribed focus on medication adherence by using pill box check glucose daily, document, and provide at future appointments check blood pressure daily, document, and provide at future appointments Contact provider or pharmacist if patient decides to trial FIran   Follow Up Plan: Telephone follow up appointment with care management team member scheduled for: March 2023      The patient verbalized understanding of instructions, educational materials, and care plan provided today and declined offer to receive copy of patient instructions, educational materials, and care plan.  The pharmacy team will reach out to the patient again over  the next 90 days.   NLane Hacker [facility]

## 2021-05-30 NOTE — Progress Notes (Signed)
Chronic Care Management Pharmacy Note    05/30/2021 Name:  Anetra Czerwinski MRN:  [id number] DOB:  06-24-1941   Plan Recommendations:   Working on [employer] for [employer] Patient told me that Venlafaxine isn't working any more. I can't tell if it's giving her ADR's or if it isn't effective anymore. I told her I'd let you know that she's thinking about changing the medication or increasing it depending on what you recommend at the f/u in 2 weeks. Thank you  Subjective: Jiali Linney is an 80 y.o. year old female who is a primary patient of Cox, Kirsten, MD.  The CCM team was consulted for assistance with disease management and care coordination needs.    Engaged with patient by telephone for follow up visit in response to provider referral for pharmacy case management and/or care coordination services.   Consent to Services:  The patient was given information about Chronic Care Management services, agreed to services, and gave verbal consent prior to initiation of services.  Please see initial visit note for detailed documentation.   Patient Care Team: [name], MD as PCP - General (Family Medicine) Elayne Snare, MD as Consulting Physician (Endocrinology) Burnice Logan, [facility] (Inactive) as Pharmacist (Pharmacist) Marice Potter, MD as Consulting Physician (Oncology) Flossie Buffy., MD as Referring Physician (Cardiology) Jackquline Denmark, MD as Consulting Physician (Gastroenterology) [place], [facility] (Ophthalmology)  Recent office visits: 02/21/2021 - bone density ordered. LDL improved but above goal of 70. Increase Recommend change fluvastatin to Lovastatin 40 mg daily.  01/13/2021 - AWV.  11/05/2020 - acute cystitis - macrobid.  10/22/2020 - fluvastatin 20 mg daily at bedtime ordered. INR elevated could be why bruising in addition to Xarelto. LDL 111. Blood count normal. Kidney function normal. Liver function normal. A1c 6.7%.   Recent consult  visits: 12/23/2020 - cardiology - cut the carvedilol to 1 whole pill two times daily and call if low bp issues. Cutting the furosemide every other day with extra as needed.  10/13/2020 - HEM/ONC -stable anemia of chronic disease. Follow-up in 6 months.  10/08/2020 - Cardiology - device check.  09/27/2020 - pacemaker upgrade.  09/24/2020 - Endo - consider SGLT2 but usually doesn't want brand name medications. May need to be coordinated with cardio. Increase evening metformin to 1000 mg in the evening. Fluvastatin recommended.   Hospital visits:   Objective:  Lab Results  Component Value Date   CREATININE 1.33 (H) 05/03/2021   BUN 42 (H) 05/03/2021   GFR 32.91 (L) 03/23/2020   GFRNONAA 60 09/08/2020   GFRAA 69 09/08/2020   NA 136 05/03/2021   K 4.2 05/03/2021   CALCIUM 9.6 05/03/2021   CO2 24 05/03/2021    Lab Results  Component Value Date/Time   HGBA1C 6.6 (A) 03/25/2021 08:29 AM   HGBA1C 6.7 (H) 10/22/2020 09:01 AM   HGBA1C 6.0 (A) 09/24/2020 09:30 AM   HGBA1C 6.3 (H) 11/20/2019 09:31 AM   GFR 32.91 (L) 03/23/2020 08:52 AM   GFR 54.56 (L) 01/02/2019 09:32 AM   MICROALBUR 4.0 (H) 03/25/2021 08:59 AM   MICROALBUR <0.7 03/23/2020 10:49 AM    Last diabetic Eye exam:  Lab Results  Component Value Date/Time   HMDIABEYEEXA No Retinopathy 01/12/2021 12:00 AM    Last diabetic Foot exam: No results found for: HMDIABFOOTEX   Lab Results  Component Value Date   CHOL 178 02/21/2021   HDL 74 02/21/2021   LDLCALC 89 02/21/2021   LDLDIRECT 94.2 12/05/2013  TRIG 85 02/21/2021   CHOLHDL 2.4 02/21/2021    Hepatic Function Latest Ref Rng & Units 05/03/2021 04/15/2021 02/21/2021  Total Protein 6.0 - 8.5 g/dL 6.3 - 6.0  Albumin 3.7 - 4.7 g/dL 4.0 3.6 3.8  AST 0 - 40 IU/L 39 35 35  ALT 0 - 32 IU/L 31 23 25   Alk Phosphatase 44 - 121 IU/L 126(H) 103 118  Total Bilirubin 0.0 - 1.2 mg/dL 0.4 - 0.4    Lab Results  Component Value Date/Time   TSH 1.64 03/23/2020 08:52 AM   TSH 1.40  01/02/2019 09:32 AM   FREET4 1.28 03/23/2020 08:52 AM   FREET4 1.02 01/02/2019 09:32 AM    CBC Latest Ref Rng & Units 04/15/2021 02/21/2021 10/22/2020  WBC - 7.9 6.8 7.9  Hemoglobin 12.0 - 16.0 12.3 12.4 12.8  Hematocrit 36 - 46 37 38.3 39.4  Platelets 150 - 399 160 160 160    No results found for: VD25OH  Clinical ASCVD: No  The 10-year ASCVD risk score (Arnett DK, et al., 2019) is: 47.9%   Values used to calculate the score:     Age: 73 years     Sex: Female     Is Non-Hispanic African American: No     Diabetic: Yes     Tobacco smoker: No     Systolic Blood Pressure: 829 mmHg     Is BP treated: Yes     HDL Cholesterol: 74 mg/dL     Total Cholesterol: 178 mg/dL    Depression screen [facility] 2/9 02/21/2021 01/13/2021 10/22/2020  Decreased Interest 0 0 0  Down, Depressed, Hopeless 0 0 1  PHQ - 2 Score 0 0 1  Altered sleeping 1 - 1  Tired, decreased energy 1 - 0  Change in appetite 0 - 1  Feeling bad or failure about yourself  0 - 0  Trouble concentrating 2 - 2  Moving slowly or fidgety/restless 1 - 0  Suicidal thoughts 0 - 0  PHQ-9 Score 5 - 5  Difficult doing work/chores Somewhat difficult - Somewhat difficult     Social History   Tobacco Use  Smoking Status Never  Smokeless Tobacco Never   BP Readings from Last 3 Encounters:  05/03/21 (!) 114/58  04/15/21 (!) 159/70  03/25/21 118/67   Pulse Readings from Last 3 Encounters:  05/03/21 78  04/15/21 80  03/25/21 67   Wt Readings from Last 3 Encounters:  05/03/21 106 lb (48.1 kg)  04/15/21 110 lb 6.4 oz (50.1 kg)  03/25/21 110 lb 9.6 oz (50.2 kg)    Assessment/Interventions: Review of patient past medical history, allergies, medications, health status, including review of consultants reports, laboratory and other test data, was performed as part of comprehensive evaluation and provision of chronic care management services.   SDOH:  (Social Determinants of Health) assessments and interventions performed: Yes SDOH  Interventions    Flowsheet Row Most Recent Value  SDOH Interventions   Financial Strain Interventions Other (Comment)  [PAP of 2 meds completed]       CCM Care Plan  Allergies  Allergen Reactions   Atorvastatin Other (See Comments)    "muscle Cramps"   Budesonide Other (See Comments)    "fatigue"   Gabapentin Other (See Comments)    "fatigue"   Metoclopramide Other (See Comments)    "mayalgia"   Rosuvastatin Other (See Comments)    "muscle cramps"   Simvastatin Other (See Comments)    "muscle Cramps"   Sulfamethoxazole-Trimethoprim Rash  Medications Reviewed Today     Reviewed by [name], MD (Physician) on 05/04/21 at 2106  Med List Status: <None>   Medication Order Taking? Sig Documenting Provider Last Dose Status Informant  albuterol (VENTOLIN HFA) 108 (90 Base) MCG/ACT inhaler [id number]  INHALE 1 TO 2 PUFFS BY MOUTH EVERY 4 HOURS AS NEEDED FOR WHEEZING Marge Duncans, PA-C  Active   aspirin 81 MG chewable tablet [id number] No Chew 81 mg by mouth in the morning.  [provider] Taking Active Self  Blood Glucose Monitoring Suppl (ONETOUCH VERIO FLEX SYSTEM) w/Device KIT [id number] No Check sugar once daily Elayne Snare, MD Taking Active   Budeson-Glycopyrrol-Formoterol (BREZTRI AEROSPHERE) 160-9-4.8 MCG/ACT AERO [id number] No Inhale 2 puffs into the lungs 2 (two) times daily. Cox, Kirsten, MD Taking Active   calcium-vitamin D (OSCAL WITH D) 500-200 MG-UNIT TABS tablet [id number] No Take 1 tablet by mouth daily. [provider] Taking Active   carvedilol (COREG) 25 MG tablet [id number] No Take 25 mg by mouth 2 (two) times daily with a meal. [provider] Taking Active Self  Cholecalciferol (VITAMIN D-3) 1000 UNITS CAPS [id number] No Take 1,000 Units by mouth daily. [provider] Taking Active   furosemide (LASIX) 20 MG tablet [id number] No Take 1 tablet (20 mg total) by mouth daily.  Patient taking differently: Take 40 mg by mouth daily.    Cox, Kirsten, MD Taking Active   Glucagon, rDNA, (GLUCAGON EMERGENCY) 1 MG KIT [id number]  Inject 1 kit as directed once for 1 dose. If sugars < 70, if you cannot increase with oral glucose. [name], MD  Expired 05/20/20 2359   losartan (COZAAR) 50 MG tablet [id number] No Take 50 mg by mouth daily. [provider] Taking Active   lovastatin (MEVACOR) 40 MG tablet [id number] No Take 40 mg by mouth at bedtime. [provider] Taking Active   Magnesium 250 MG TABS [id number] No Take 250 mg by mouth daily. [provider] Taking Active   metFORMIN (GLUCOPHAGE) 500 MG tablet [id number]  TAKE 1 TABLET BY MOUTH TWICE DAILY WITH MORNING MEAL AND WITH EVENING MEAL Elayne Snare, MD  Active   Multiple Vitamins-Minerals (CENTRUM SILVER ULTRA WOMENS PO) [id number] No Take by mouth daily. [provider] Taking Active   nitroGLYCERIN (NITROSTAT) 0.4 MG SL tablet [id number] No Place 0.4 mg under the tongue every 5 (five) minutes as needed for chest pain. [provider] Taking Active   OneTouch Delica Lancets 39J MISC [id number] No USE 1  TO CHECK GLUCOSE ONCE DAILY *APPOINTMENT  REQUIRED  FOR  FUTURE  REFILLS Elayne Snare, MD Taking Active   [facility] VERIO test strip [id number]  Use to check blood sugar once daily  DX CODE E11.9 Elayne Snare, MD  Active   polyethylene glycol [facility] / GLYCOLAX) packet [id number] No Take 17 g by mouth daily as needed. [provider] Taking Active   Rivaroxaban (XARELTO) 15 MG TABS tablet [id number]  Take 1 tablet (15 mg total) by mouth daily.  Patient taking differently: Take 15 mg by mouth daily. Take e/o day   Cox, Elnita Maxwell, MD  Active   venlafaxine XR (EFFEXOR-XR) 75 MG 24 hr capsule [id number] No Take 1 capsule (75 mg total) by mouth daily. [name], MD Taking Active             Patient Active Problem List   Diagnosis Date Noted   SOB (shortness of breath) 05/04/2021   Hypomagnesemia 05/04/2021   Acute respiratory  failure with  hypoxia ([place]) 05/04/2021   Hypotension, unspecified 05/04/2021   Anemia of chronic renal failure 10/13/2020   Chronic kidney disease, stage II (mild) 10/13/2020   Drug-induced myopathy 06/25/2020   Acute congestive heart failure ([place]) 06/25/2020   Presence of cardiac pacemaker 06/25/2020   Major depressive disorder, recurrent episode, mild ([place]) 06/25/2020   Nonischemic dilated cardiomyopathy ([place]) 06/18/2020   Heart failure with reduced ejection fraction, NYHA class III ([place]) 05/29/2020   History of cardiac cath 03/19/2020   LV dysfunction 02/04/2020   Dyspnea on exertion 01/06/2020   Diabetic glomerulopathy ([place]) 11/20/2019   Diarrhea 11/20/2019   Other constipation 11/20/2019   Night sweats 11/20/2019   Adhesive capsulitis of right shoulder 11/20/2019   At moderate risk for fall 11/20/2019   TIA (transient ischemic attack) 06/03/2018   History of DVT (deep vein thrombosis) 10/13/2016   Essential hypertension 06/06/2013   Mixed hyperlipidemia 06/05/2013   Thyroid nodule 06/05/2013    Immunization History  Administered Date(s) Administered   Fluad Quad(high Dose 65+) 05/20/2020   Influenza, High Dose Seasonal PF 06/05/2017   Influenza,trivalent, recombinat, inj, PF 05/14/2012   Influenza-Unspecified 07/02/2014, 04/13/2015, 04/24/2016, 06/14/2018   PFIZER(Purple Top)SARS-COV-2 Vaccination 08/29/2019, 09/19/2019, 05/14/2020, 01/03/2021   Pneumococcal Conjugate-13 04/27/2014   Pneumococcal Polysaccharide-23 07/14/2010, 07/14/2010, 12/02/2015   Tdap 11/01/2017    Conditions to be addressed/monitored:  Hypertension, Hyperlipidemia, Diabetes, Heart Failure, Depression, Osteoporosis and anemia  Care Plan : [place]  Updates made by Lane Hacker, [place] since 05/30/2021 12:00 AM     Problem: dm, chf, hld   Priority: High  Onset Date: 10/25/2020     Long-Range Goal: Disease State Management   Start Date: 10/25/2020  Expected End Date: 10/25/2021   Recent Progress: On track  Priority: High  Note:   Current Barriers:  Unable to independently afford treatment regimen  Pharmacist Clinical Goal(s):  Over the next 90 days, patient will verbalize ability to afford treatment regimen through collaboration with PharmD and provider.   Interventions: 1:1 collaboration with [name], MD regarding development and update of comprehensive plan of care as evidenced by provider attestation and co-signature Inter-disciplinary care team collaboration (see longitudinal plan of care) Comprehensive medication review performed; medication list updated in electronic medical record  Hypertension (BP goal <130/80) BP Readings from Last 3 Encounters:  05/03/21 (!) 114/58  04/15/21 (!) 159/70  03/25/21 118/67  -Controlled -Current treatment: Carvedilol 25 mg  1 tablet mg bid Losartan 25 mg daily  -Medications previously tried:   -Current home readings: today is 105/59 mmHg. Had patient recheck and pharmacist calling back to determine updated blood pressure.  -Current dietary habits: eats healthy overall. Meat and vegetables mainly. Currently putting up fresh garden vegetables.  -Current exercise habits: walking some -Denies hypotensive/hypertensive symptoms -Educated on Daily salt intake goal < 2300 mg; Exercise goal of 150 minutes per week; Importance of home blood pressure monitoring; -Counseled to monitor BP at home daily, document, and provide log at future appointments -Counseled on diet and exercise extensively Recommended to continue current medication  Diabetes (A1c goal <7%) Lab Results  Component Value Date   HGBA1C 6.6 (A) 03/25/2021   HGBA1C 6.7 (H) 10/22/2020   HGBA1C 6.0 (A) 09/24/2020   Lab Results  Component Value Date   MICROALBUR 4.0 (H) 03/25/2021   LDLCALC 89 02/21/2021   CREATININE 1.33 (H) 05/03/2021   Lab Results  Component Value Date   NA 136 05/03/2021   K 4.2 05/03/2021   CREATININE 1.33 (H) 05/03/2021  EGFR 41 (L) 05/03/2021   GFRNONAA 60 09/08/2020   GLUCOSE 121 (H) 05/03/2021   Lab Results  Component Value Date   WBC 7.9 04/15/2021   HGB 12.3 04/15/2021   HCT 37 04/15/2021   MCV 91 02/21/2021   PLT 160 04/15/2021  -Controlled -Current medications: metformin 500 mg morning and 500 mg evening meals Glucagon 1 mg as directed -Medications previously tried: pioglitazone  -Current home glucose readings fasting glucose: 87, 121, 164, 128, 144  post prandial glucose: not taking  -Denies hypoglycemic/hyperglycemic symptoms -Current meal patterns:  Patient reports healthy diet and mainly eats at home.  -Current exercise: walking  -Educated on A1c and blood sugar goals; Complications of diabetes including kidney damage, retinal damage, and cardiovascular disease; Exercise goal of 150 minutes per week; -Counseled to check feet daily and get yearly eye exams -Counseled on diet and exercise extensively Counseled on considering SGLT2 medication for blood sugar, chf and kidney benefits per endocrinology recommendation.  Plan: At goal,  patient stable/ symptoms controlled   Heart Failure (Goal: manage symptoms and prevent exacerbations) -Controlled -Last ejection fraction: 20-25% (Date: 07/2020) -HF type: Diastolic -NYHA Class: III (marked limitation of activity) -Current treatment: carvedilol 25 mg bid  Furosemide 20 mg every other day unless swelling increases Losartan 25 mg daily  Nitroglycerin 0.4 mg sl every 5 minutes prn chest pain -Medications previously tried: none reported -Current home BP/HR readings: well controlled currently -Current dietary habits: eating healthy diet. Meat and vegetables.  -Current exercise habits: walking more and working to increase stamina since pacemaker updated 09/27/2020.  -Educated on Benefits of medications for managing symptoms and prolonging life Importance of blood pressure control -Counseled on diet and exercise extensively Recommended to  continue current medication Recommended considering Farxiga.   Hyperlipidemia: (LDL goal < 55) -Uncontrolled -Current treatment: lovastatin 40 mg daily at bedtime  Aspirin 81 mg daily  -Medications previously tried: atorvastatin, rosuvastatin, simvastatin, fluvastatin -Current dietary patterns: overall healthy diet.  -Current exercise habits: walking more and working to rebuild stamina.  -Educated on Cholesterol goals;  Benefits of statin for ASCVD risk reduction; Importance of limiting foods high in cholesterol; Exercise goal of 150 minutes per week; -Counseled on diet and exercise extensively Recommended to continue current medication  Anticoagulation -Controlled -Current treatment: Xarelto 55m October 2022: PAP due to be renewed 07/06/21, will complete  COPD -Controlled -Current treatment: Breztri October 2022: PAP due to be renewed 07/06/21, will complete    Patient Goals/Self-Care Activities Over the next 90 days, patient will:  - take medications as prescribed focus on medication adherence by using pill box check glucose daily, document, and provide at future appointments check blood pressure daily, document, and provide at future appointments Contact provider or pharmacist if patient decides to trial FIran   Follow Up Plan: Telephone follow up appointment with care management team member scheduled for: March 2023       Medication Assistance:  [facility].  Enrollment ends 08/13/2021 Xarelto through PAP as well  [facility] [place]Phone: [phone]: [phone] Uses pill box? Yes Pt endorses 100% compliance  We discussed: Benefits of medication synchronization, packaging and delivery as well as enhanced pharmacist oversight with [employer]. Patient decided to: Continue current  medication management strategy  Care Plan and Follow Up Patient Decision:  Patient agrees to Care Plan and Follow-up.  Plan:  Telephone follow up appointment with care management team member scheduled for:  April 2023  [place], [place].D. - [phone]

## 2021-05-31 ENCOUNTER — Telehealth: Payer: Self-pay

## 2021-05-31 NOTE — Chronic Care Management (AMB) (Signed)
    Chronic Care Management Pharmacy Assistant   Name: Bridget [name]  MRN: [id number] DOB: 09/27/1940   Patient Assistance Documentation for Xarelto and [place] on 05/31/21 for [facility] Renewal @ [phone] and was on hold for 14 minutes. I could not wait on hold so I hung up. [place] is automatically re-en[place] medicare pts and the message stated we do not need to do anything.  Called [employer] on 05/31/21 for Xarelto Renewal and they stated they can not submit a new application until a month prior which would be 06/05/21.  Pt has been informed and was told to call [place] to follow up with the renewal   Medications: Outpatient Encounter Medications as of 05/31/2021  Medication Sig   albuterol (VENTOLIN HFA) 108 (90 Base) MCG/ACT inhaler INHALE 1 TO 2 PUFFS BY MOUTH EVERY 4 HOURS AS NEEDED FOR WHEEZING   aspirin 81 MG chewable tablet Chew 81 mg by mouth in the morning.    Blood Glucose Monitoring Suppl (ONETOUCH VERIO FLEX SYSTEM) w/Device KIT Check sugar once daily   Budeson-Glycopyrrol-Formoterol (BREZTRI AEROSPHERE) 160-9-4.8 MCG/ACT AERO Inhale 2 puffs into the lungs 2 (two) times daily.   calcium-vitamin D (OSCAL WITH D) 500-200 MG-UNIT TABS tablet Take 1 tablet by mouth daily.   carvedilol (COREG) 25 MG tablet Take 25 mg by mouth 2 (two) times daily with a meal.   Cholecalciferol (VITAMIN D-3) 1000 UNITS CAPS Take 1,000 Units by mouth daily.   furosemide (LASIX) 20 MG tablet Take 1 tablet (20 mg total) by mouth daily. (Patient taking differently: Take 40 mg by mouth daily.)   Glucagon, rDNA, (GLUCAGON EMERGENCY) 1 MG KIT Inject 1 kit as directed once for 1 dose. If sugars < 70, if you cannot increase with oral glucose.   losartan (COZAAR) 50 MG tablet Take 50 mg by mouth daily.   lovastatin (MEVACOR) 40 MG tablet TAKE 1 TABLET BY MOUTH AT BEDTIME   Magnesium 250 MG TABS Take 250 mg by mouth daily.   metFORMIN (GLUCOPHAGE) 500 MG tablet TAKE 1 TABLET BY MOUTH TWICE DAILY WITH  MORNING MEAL AND WITH EVENING MEAL   Multiple Vitamins-Minerals (CENTRUM SILVER ULTRA WOMENS PO) Take by mouth daily.   nitroGLYCERIN (NITROSTAT) 0.4 MG SL tablet Place 0.4 mg under the tongue every 5 (five) minutes as needed for chest pain.   OneTouch Delica Lancets 97F MISC USE 1  TO CHECK GLUCOSE ONCE DAILY *APPOINTMENT  REQUIRED  FOR  FUTURE  REFILLS   ONETOUCH VERIO test strip Use to check blood sugar once daily  DX CODE E11.9   polyethylene glycol (MIRALAX / GLYCOLAX) packet Take 17 g by mouth daily as needed.   Rivaroxaban (XARELTO) 15 MG TABS tablet Take 1 tablet (15 mg total) by mouth daily. (Patient taking differently: Take 15 mg by mouth daily. Take e/o day)   venlafaxine XR (EFFEXOR-XR) 75 MG 24 hr capsule Take 1 capsule (75 mg total) by mouth daily.   No facility-administered encounter medications on file as of 05/31/2021.     Elray Mcgregor, [place] Pharmacist Assistant  [phone]

## 2021-06-06 LAB — HM MAMMOGRAPHY

## 2021-06-07 ENCOUNTER — Other Ambulatory Visit: Payer: Self-pay | Admitting: Family Medicine

## 2021-06-07 NOTE — Telephone Encounter (Signed)
Refill sent to pharmacy.   

## 2021-06-09 DIAGNOSIS — Z1231 Encounter for screening mammogram for malignant neoplasm of breast: Secondary | ICD-10-CM | POA: Diagnosis not present

## 2021-06-13 DIAGNOSIS — I509 Heart failure, unspecified: Secondary | ICD-10-CM

## 2021-06-13 DIAGNOSIS — I10 Essential (primary) hypertension: Secondary | ICD-10-CM

## 2021-06-13 DIAGNOSIS — F330 Major depressive disorder, recurrent, mild: Secondary | ICD-10-CM

## 2021-06-13 DIAGNOSIS — E782 Mixed hyperlipidemia: Secondary | ICD-10-CM

## 2021-06-14 ENCOUNTER — Encounter: Payer: Self-pay | Admitting: Family Medicine

## 2021-06-15 ENCOUNTER — Encounter: Payer: Self-pay | Admitting: Family Medicine

## 2021-06-15 ENCOUNTER — Other Ambulatory Visit: Payer: Self-pay

## 2021-06-15 ENCOUNTER — Ambulatory Visit (INDEPENDENT_AMBULATORY_CARE_PROVIDER_SITE_OTHER): Payer: Medicare Other | Admitting: Family Medicine

## 2021-06-15 VITALS — BP 102/58 | HR 78 | Temp 97.4°F | Resp 16 | Ht 61.0 in | Wt 103.8 lb

## 2021-06-15 DIAGNOSIS — R531 Weakness: Secondary | ICD-10-CM | POA: Diagnosis not present

## 2021-06-15 DIAGNOSIS — E782 Mixed hyperlipidemia: Secondary | ICD-10-CM

## 2021-06-15 DIAGNOSIS — F331 Major depressive disorder, recurrent, moderate: Secondary | ICD-10-CM | POA: Diagnosis not present

## 2021-06-15 DIAGNOSIS — Z23 Encounter for immunization: Secondary | ICD-10-CM

## 2021-06-15 DIAGNOSIS — I110 Hypertensive heart disease with heart failure: Secondary | ICD-10-CM | POA: Insufficient documentation

## 2021-06-15 DIAGNOSIS — I130 Hypertensive heart and chronic kidney disease with heart failure and stage 1 through stage 4 chronic kidney disease, or unspecified chronic kidney disease: Secondary | ICD-10-CM | POA: Diagnosis not present

## 2021-06-15 DIAGNOSIS — I10 Essential (primary) hypertension: Secondary | ICD-10-CM | POA: Diagnosis not present

## 2021-06-15 DIAGNOSIS — E1121 Type 2 diabetes mellitus with diabetic nephropathy: Secondary | ICD-10-CM

## 2021-06-15 DIAGNOSIS — N1831 Chronic kidney disease, stage 3a: Secondary | ICD-10-CM | POA: Diagnosis not present

## 2021-06-15 NOTE — Assessment & Plan Note (Signed)
Low-fat diet and exercise. Continue lovastatin 40 mg once daily.

## 2021-06-15 NOTE — Patient Instructions (Signed)
Continue venlafaxine for 5 days then stop.  Start trilintillex 5 mg once daily starting today.

## 2021-06-15 NOTE — Assessment & Plan Note (Signed)
Avoid NSAIDs.  Continue current medications.

## 2021-06-15 NOTE — Progress Notes (Signed)
Subjective:  Patient ID: Bridget [name], female    DOB: 29-May-1941  Age: 80 y.o. MRN: [id number]  Chief Complaint  Patient presents with   Hyperlipidemia   Hypertension   Diabetes    Diabetes:  Complications: Nephropathy Glucose checking daily:112-159 Most recent A1C: Current medications: Metformin 500 mg in the morning and 1000 mg in the evening.  Patient has been having increased GI upset.  She intends to call endocrinology per their instructions if she had issues with this increase in dosage. Last Eye Exam: Annually Foot checks: Daily Endocrinology sees her.  Hyperlipidemia: Current medications: Lovastatin 40 mg once daily.  Hypertension: Complications: [id number]/ 50-60s. Pulse 79-80s Current medications:  Diet: Exercise:    Current Outpatient Medications on File Prior to Visit  Medication Sig Dispense Refill   albuterol (VENTOLIN HFA) 108 (90 Base) MCG/ACT inhaler INHALE 1 TO 2 PUFFS BY MOUTH EVERY 4 HOURS AS NEEDED FOR WHEEZING 9 g 0   aspirin 81 MG chewable tablet Chew 81 mg by mouth in the morning.      Blood Glucose Monitoring Suppl (ONETOUCH VERIO FLEX SYSTEM) w/Device KIT Check sugar once daily 1 kit 0   Budeson-Glycopyrrol-Formoterol (BREZTRI AEROSPHERE) 160-9-4.8 MCG/ACT AERO Inhale 2 puffs into the lungs 2 (two) times daily. 10.7 g 5   calcium-vitamin D (OSCAL WITH D) 500-200 MG-UNIT TABS tablet Take 1 tablet by mouth daily.     carvedilol (COREG) 12.5 MG tablet Take 12.5 mg by mouth 2 (two) times daily with a meal.     Cholecalciferol (VITAMIN D-3) 1000 UNITS CAPS Take 1,000 Units by mouth daily.     furosemide (LASIX) 20 MG tablet Take 1 tablet (20 mg total) by mouth daily. (Patient taking differently: Take 20 mg by mouth 2 (two) times daily.) 30 tablet 0   losartan (COZAAR) 25 MG tablet Take 25 mg by mouth daily.     lovastatin (MEVACOR) 40 MG tablet TAKE 1 TABLET BY MOUTH AT BEDTIME 90 tablet 0   Magnesium 250 MG TABS Take 250 mg by mouth daily.      metFORMIN (GLUCOPHAGE) 500 MG tablet TAKE 1 TABLET BY MOUTH TWICE DAILY WITH MORNING MEAL AND WITH EVENING MEAL (Patient taking differently: Take 536m in the morning and 10069mat night.) 180 tablet 0   Multiple Vitamins-Minerals (CENTRUM SILVER ULTRA WOMENS PO) Take by mouth daily.     nitroGLYCERIN (NITROSTAT) 0.4 MG SL tablet DISSOLVE ONE TABLET UNDER THE TONGUE EVERY 5 MINUTES AS NEEDED FOR CHEST PAIN.  DO NOT EXCEED A TOTAL OF 3 DOSES IN 15 MINUTES 25 tablet 0   OneTouch Delica Lancets 3341UISC USE 1  TO CHECK GLUCOSE ONCE DAILY *APPOINTMENT  REQUIRED  FOR  FUTURE  REFILLS 100 each 3   ONETOUCH VERIO test strip Use to check blood sugar once daily  DX CODE E11.9 100 each 1   polyethylene glycol (MIRALAX / GLYCOLAX) packet Take 17 g by mouth daily as needed.     Rivaroxaban (XARELTO) 15 MG TABS tablet Take 1 tablet (15 mg total) by mouth daily. (Patient taking differently: Take 15 mg by mouth. Take 1 tablet every other day) 90 tablet 0   venlafaxine XR (EFFEXOR-XR) 75 MG 24 hr capsule Take 1 capsule (75 mg total) by mouth daily. 90 capsule 0   Glucagon, rDNA, (GLUCAGON EMERGENCY) 1 MG KIT Inject 1 kit as directed once for 1 dose. If sugars < 70, if you cannot increase with oral glucose. 1 kit 1   No current  facility-administered medications on file prior to visit.   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[place])    Statin myopathy    Thyroid disease    Past Surgical History:  Procedure Laterality Date   ABDOMINAL HYSTERECTOMY  03/05/1998   pt does not think this was a total hysterectomy   APPENDECTOMY   12/31/1988   BREAST LUMPECTOMY Left 05/22/1996   pacemaker  09/27/2016   PACEMAKER REVISION  09/27/2020   PACEMAKER UPGRADE DUAL CHAMBER TO BIV    Family History  Problem Relation Age of Onset   High blood pressure Mother    Diabetes Mother        no medication needed   Kidney disease Mother    Pneumonia Mother    High blood pressure Father    Cancer Maternal Grandmother        Leukemia   Cancer Other        Ovarian   Social History   Socioeconomic History   Marital status: Married    Spouse name: Darryl   Number of children: 2   Years of education: Not on file   Highest education level: 9th grade  Occupational History   Occupation: Retired  Tobacco Use   Smoking status: Never   Smokeless tobacco: Never  Vaping Use   Vaping Use: Never used  Substance and Sexual Activity   Alcohol use: Never   Drug use: Never   Sexual activity: Not on file  Other Topics Concern   Not on file  Social History Narrative   Lives at home with her husband   Right handed   Caffeine: mostly tea, 0-2 cups a day   Social Determinants of Health   Financial Resource Strain: High Risk   Difficulty of Paying Living Expenses: Very hard  Food Insecurity: No Food Insecurity   Worried About Charity fundraiser in the Last Year: Never true   Arboriculturist in the Last Year: Never true  Transportation Needs: No Transportation Needs   Lack of Transportation (Medical): No   Lack of Transportation (Non-Medical): No  Physical Activity: Not on file  Stress: Not on file  Social Connections: Not on file    Review of Systems  Constitutional:  Positive for fatigue. Negative for appetite change and fever.  HENT:  Negative for congestion, ear pain, sinus pressure and sore throat.   Eyes:  Positive for visual disturbance. Negative for pain.  Respiratory:  Positive for cough and shortness of breath. Negative for chest tightness and wheezing.   Cardiovascular:  Negative for chest pain and palpitations.   Gastrointestinal:  Positive for constipation, diarrhea and nausea. Negative for abdominal pain and vomiting.  Genitourinary:  Positive for frequency and urgency. Negative for dysuria and hematuria.  Musculoskeletal:  Positive for back pain and myalgias. Negative for arthralgias and joint swelling.  Skin:  Negative for rash.  Neurological:  Positive for headaches. Negative for dizziness and weakness.  Psychiatric/Behavioral:  Positive for dysphoric mood (Current Depression medication not as effective, wanted to discuss  changing.). The patient is not nervous/anxious.     Objective:  BP (!) 102/58 (BP Location: Right Arm, Patient Position: Sitting)   Pulse 78   Temp (!) 97.4 F (36.3 C) (Temporal)   Resp 16   Ht 5' 1"  (1.549 m)   Wt 103 lb 12.8 oz (47.1 kg)   BMI 19.61 kg/m   BP/Weight 06/15/2021 2/72/5366 11/15/345  Systolic BP 425 956 387  Diastolic BP 58 58 70  Wt. (Lbs) 103.8 106 110.4  BMI 19.61 20.03 20.86    Physical Exam Vitals reviewed.  Constitutional:      Appearance: Normal appearance. She is normal weight.  Neck:     Vascular: No carotid bruit.  Cardiovascular:     Rate and Rhythm: Normal rate and regular rhythm.     Pulses: Normal pulses.     Heart sounds: Normal heart sounds.  Pulmonary:     Effort: Pulmonary effort is normal. No respiratory distress.     Breath sounds: Normal breath sounds.  Abdominal:     General: Abdomen is flat. Bowel sounds are normal.     Palpations: Abdomen is soft.     Tenderness: There is no abdominal tenderness.  Neurological:     Mental Status: She is alert and oriented to person, place, and time.  Psychiatric:        Mood and Affect: Mood normal.        Behavior: Behavior normal.    Diabetic Foot Exam - Simple   No data filed      Lab Results  Component Value Date   WBC 7.9 04/15/2021   HGB 12.3 04/15/2021   HCT 37 04/15/2021   PLT 160 04/15/2021   GLUCOSE 121 (H) 05/03/2021   CHOL 178 02/21/2021   TRIG 85  02/21/2021   HDL 74 02/21/2021   LDLDIRECT 94.2 12/05/2013   LDLCALC 89 02/21/2021   ALT 31 05/03/2021   AST 39 05/03/2021   NA 136 05/03/2021   K 4.2 05/03/2021   CL 94 (L) 05/03/2021   CREATININE 1.33 (H) 05/03/2021   BUN 42 (H) 05/03/2021   CO2 24 05/03/2021   TSH 1.64 03/23/2020   INR 1.3 (H) 11/12/2020   HGBA1C 6.6 (A) 03/25/2021   MICROALBUR 4.0 (H) 03/25/2021      Assessment & Plan:   Problem List Items Addressed This Visit       Cardiovascular and Mediastinum   Heart and renal disease, hypertensive, stage 1-4 or unspecified chronic kidney disease, with heart failure ([place])    The current medical regimen is effective;  continue present plan and medications.       Relevant Medications   losartan (COZAAR) 25 MG tablet   carvedilol (COREG) 12.5 MG tablet   Other Relevant Orders   CBC with Differential/Platelet   Comprehensive metabolic panel     Endocrine   Diabetic glomerulopathy (HCC)   Relevant Medications   losartan (COZAAR) 25 MG tablet     Genitourinary   Stage 3a chronic kidney disease (HCC)    Avoid NSAIDs.  Continue current medications.        Other   Mixed hyperlipidemia - Primary    Low-fat diet and exercise. Continue lovastatin 40 mg once daily.      Relevant Medications   losartan (COZAAR) 25 MG tablet   carvedilol (COREG) 12.5 MG tablet   Other Relevant Orders   Lipid panel   Depression, major, recurrent, moderate (HCC)    Take venlafaxine extended release 37.5 mg  once in a.m. for 5 days then discontinue.  Start Trintellix 5 mg once daily today.      Weakness   Relevant Orders   Sedimentation rate   Other Visit Diagnoses     Need for immunization against influenza       Relevant Orders   Flu Vaccine QUAD High Dose(Fluad) (Completed)     .  No orders of the defined types were placed in this encounter.   Orders Placed This Encounter  Procedures   Flu Vaccine QUAD High Dose(Fluad)   CBC with Differential/Platelet    Comprehensive metabolic panel   Sedimentation rate   Lipid panel     Follow-up: Return in about 2 weeks (around 06/29/2021) for Depression follow up. .  An After Visit Summary was printed and given to the patient.    I,Lauren M Auman,acting as a scribe for [name], MD.,have documented all relevant documentation on the behalf of [name], MD,as directed by  [name], MD while in the presence of [name], MD.    [name], MD [place] [phone]

## 2021-06-15 NOTE — Assessment & Plan Note (Signed)
Take venlafaxine extended release 37.5 mg once in a.m. for 5 days then discontinue.  Start Trintellix 5 mg once daily today.

## 2021-06-15 NOTE — Assessment & Plan Note (Signed)
The current medical regimen is effective;  continue present plan and medications.  

## 2021-06-16 LAB — CBC WITH DIFFERENTIAL/PLATELET
Basophils Absolute: 0 10*3/uL (ref 0.0–0.2)
Basos: 1 %
EOS (ABSOLUTE): 0.2 10*3/uL (ref 0.0–0.4)
Eos: 2 %
Hematocrit: 36.2 % (ref 34.0–46.6)
Hemoglobin: 12.1 g/dL (ref 11.1–15.9)
Immature Grans (Abs): 0.1 10*3/uL (ref 0.0–0.1)
Immature Granulocytes: 2 %
Lymphocytes Absolute: 1.3 10*3/uL (ref 0.7–3.1)
Lymphs: 18 %
MCH: 30.3 pg (ref 26.6–33.0)
MCHC: 33.4 g/dL (ref 31.5–35.7)
MCV: 91 fL (ref 79–97)
Monocytes Absolute: 0.5 10*3/uL (ref 0.1–0.9)
Monocytes: 6 %
Neutrophils Absolute: 5.1 10*3/uL (ref 1.4–7.0)
Neutrophils: 71 %
Platelets: 184 10*3/uL (ref 150–450)
RBC: 4 x10E6/uL (ref 3.77–5.28)
RDW: 12.6 % (ref 11.7–15.4)
WBC: 7.2 10*3/uL (ref 3.4–10.8)

## 2021-06-16 LAB — LIPID PANEL
Chol/HDL Ratio: 2.1 ratio (ref 0.0–4.4)
Cholesterol, Total: 144 mg/dL (ref 100–199)
HDL: 69 mg/dL (ref >39)
LDL Chol Calc (NIH): 58 mg/dL (ref 0–99)
Triglycerides: 95 mg/dL (ref 0–149)
VLDL Cholesterol Cal: 17 mg/dL (ref 5–40)

## 2021-06-16 LAB — COMPREHENSIVE METABOLIC PANEL
ALT: 19 IU/L (ref 0–32)
AST: 25 IU/L (ref 0–40)
Albumin/Globulin Ratio: 1.6 (ref 1.2–2.2)
Albumin: 3.8 g/dL (ref 3.7–4.7)
Alkaline Phosphatase: 115 IU/L (ref 44–121)
BUN/Creatinine Ratio: 26 (ref 12–28)
BUN: 34 mg/dL — ABNORMAL HIGH (ref 8–27)
Bilirubin Total: 0.5 mg/dL (ref 0.0–1.2)
CO2: 25 mmol/L (ref 20–29)
Calcium: 9.6 mg/dL (ref 8.7–10.3)
Chloride: 99 mmol/L (ref 96–106)
Creatinine, Ser: 1.3 mg/dL — ABNORMAL HIGH (ref 0.57–1.00)
Globulin, Total: 2.4 g/dL (ref 1.5–4.5)
Glucose: 114 mg/dL — ABNORMAL HIGH (ref 70–99)
Potassium: 4.3 mmol/L (ref 3.5–5.2)
Sodium: 139 mmol/L (ref 134–144)
Total Protein: 6.2 g/dL (ref 6.0–8.5)
eGFR: 42 mL/min/{1.73_m2} — ABNORMAL LOW (ref >59)

## 2021-06-16 LAB — SEDIMENTATION RATE: Sed Rate: 38 mm/hr (ref 0–40)

## 2021-06-16 LAB — CARDIOVASCULAR RISK ASSESSMENT

## 2021-06-17 ENCOUNTER — Telehealth: Payer: Self-pay

## 2021-06-17 NOTE — Chronic Care Management (AMB) (Signed)
Chronic Care Management Pharmacy Assistant   Name: Bridget [name]  MRN: [id number] DOB: 06/25/1941   Reason for Encounter: Disease State call for DM     Recent office visits:  06/15/21 Bridget Brome MD. Seen for routine visit. Decreased Carvedilol from 25 mg BID to 12.5 mg BID. Decreased Losartan Potassium from 50 mg to 25 mg daily. Instructed pt to Continue venlafaxine for 5 days then stop. Start trilintillex 5 mg once daily starting today.   Recent consult visits:  None   Hospital visits:  None   Medications: Outpatient Encounter Medications as of 06/17/2021  Medication Sig   albuterol (VENTOLIN HFA) 108 (90 Base) MCG/ACT inhaler INHALE 1 TO 2 PUFFS BY MOUTH EVERY 4 HOURS AS NEEDED FOR WHEEZING   aspirin 81 MG chewable tablet Chew 81 mg by mouth in the morning.    Blood Glucose Monitoring Suppl (ONETOUCH VERIO FLEX SYSTEM) w/Device KIT Check sugar once daily   Budeson-Glycopyrrol-Formoterol (BREZTRI AEROSPHERE) 160-9-4.8 MCG/ACT AERO Inhale 2 puffs into the lungs 2 (two) times daily.   calcium-vitamin D (OSCAL WITH D) 500-200 MG-UNIT TABS tablet Take 1 tablet by mouth daily.   carvedilol (COREG) 12.5 MG tablet Take 12.5 mg by mouth 2 (two) times daily with a meal.   Cholecalciferol (VITAMIN D-3) 1000 UNITS CAPS Take 1,000 Units by mouth daily.   furosemide (LASIX) 20 MG tablet Take 1 tablet (20 mg total) by mouth daily. (Patient taking differently: Take 20 mg by mouth 2 (two) times daily.)   Glucagon, rDNA, (GLUCAGON EMERGENCY) 1 MG KIT Inject 1 kit as directed once for 1 dose. If sugars < 70, if you cannot increase with oral glucose.   losartan (COZAAR) 25 MG tablet Take 25 mg by mouth daily.   lovastatin (MEVACOR) 40 MG tablet TAKE 1 TABLET BY MOUTH AT BEDTIME   Magnesium 250 MG TABS Take 250 mg by mouth daily.   metFORMIN (GLUCOPHAGE) 500 MG tablet TAKE 1 TABLET BY MOUTH TWICE DAILY WITH MORNING MEAL AND WITH EVENING MEAL (Patient taking differently: Take 559m in the  morning and 10090mat night.)   Multiple Vitamins-Minerals (CENTRUM SILVER ULTRA WOMENS PO) Take by mouth daily.   nitroGLYCERIN (NITROSTAT) 0.4 MG SL tablet DISSOLVE ONE TABLET UNDER THE TONGUE EVERY 5 MINUTES AS NEEDED FOR CHEST PAIN.  DO NOT EXCEED A TOTAL OF 3 DOSES IN 15 MINUTES   OneTouch Delica Lancets 3394IISC USE 1  TO CHECK GLUCOSE ONCE DAILY *APPOINTMENT  REQUIRED  FOR  FUTURE  REFILLS   ONETOUCH VERIO test strip Use to check blood sugar once daily  DX CODE E11.9   polyethylene glycol (MIRALAX / GLYCOLAX) packet Take 17 g by mouth daily as needed.   Rivaroxaban (XARELTO) 15 MG TABS tablet Take 1 tablet (15 mg total) by mouth daily. (Patient taking differently: Take 15 mg by mouth. Take 1 tablet every other day)   venlafaxine XR (EFFEXOR-XR) 75 MG 24 hr capsule Take 1 capsule (75 mg total) by mouth daily.   No facility-administered encounter medications on file as of 06/17/2021.   Recent Relevant Labs: Lab Results  Component Value Date/Time   HGBA1C 6.6 (A) 03/25/2021 08:29 AM   HGBA1C 6.7 (H) 10/22/2020 09:01 AM   HGBA1C 6.0 (A) 09/24/2020 09:30 AM   HGBA1C 6.3 (H) 11/20/2019 09:31 AM   MICROALBUR 4.0 (H) 03/25/2021 08:59 AM   MICROALBUR <0.7 03/23/2020 10:49 AM    Kidney Function Lab Results  Component Value Date/Time   CREATININE 1.30 (H)  06/15/2021 11:12 AM   CREATININE 1.33 (H) 05/03/2021 04:30 PM   GFR 32.91 (L) 03/23/2020 08:52 AM   GFRNONAA 60 09/08/2020 02:42 PM   GFRAA 69 09/08/2020 02:42 PM     Current antihyperglycemic regimen:  Metformin 500 mg daily  Glucagon 1 mg kit as needed   Patient verbally confirms she is taking the above medications as directed. Yes  What recent interventions/DTPs have been made to improve glycemic control:  Pt stated no changes   Have there been any recent hospitalizations or ED visits since last visit with CPP? No  Patient denies hypoglycemic symptoms, including None  Patient reports hyperglycemic symptoms, including  polyuria  How often are you checking your blood sugar? once daily  What are your blood sugars ranging?  109,113,120,115  On insulin? No   During the week, how often does your blood glucose drop below 70? Never  Are you checking your feet daily/regularly? Yes  Adherence Review: Is the patient currently on a STATIN medication? Yes Is the patient currently on ACE/ARB medication? Yes Does the patient have >5 day gap between last estimated fill dates? No   Care Gaps: Last eye exam / Retinopathy Screening? 01/12/21 Last Annual Wellness Visit? 01/13/21 Last Diabetic Foot Exam? 10/22/20   Star Rating Drugs:  Medication:  Last Fill: Day Supply Metformin   04/13/21 90ds    [place] Clinical Pharmacist Assitant [phone]

## 2021-06-20 ENCOUNTER — Other Ambulatory Visit: Payer: Self-pay | Admitting: Endocrinology

## 2021-06-20 DIAGNOSIS — E119 Type 2 diabetes mellitus without complications: Secondary | ICD-10-CM

## 2021-06-30 ENCOUNTER — Ambulatory Visit (INDEPENDENT_AMBULATORY_CARE_PROVIDER_SITE_OTHER): Payer: Medicare Other | Admitting: Family Medicine

## 2021-06-30 ENCOUNTER — Other Ambulatory Visit: Payer: Self-pay

## 2021-06-30 VITALS — BP 110/58 | HR 76 | Temp 97.2°F | Resp 16 | Ht 61.0 in | Wt 103.4 lb

## 2021-06-30 DIAGNOSIS — D6869 Other thrombophilia: Secondary | ICD-10-CM | POA: Diagnosis not present

## 2021-06-30 DIAGNOSIS — Z86718 Personal history of other venous thrombosis and embolism: Secondary | ICD-10-CM | POA: Diagnosis not present

## 2021-06-30 DIAGNOSIS — F331 Major depressive disorder, recurrent, moderate: Secondary | ICD-10-CM

## 2021-06-30 NOTE — Progress Notes (Addendum)
Subjective:  Patient ID: Bridget [name], female    DOB: [birth year]  Age: 80 y.o. MRN: [id number]  Chief Complaint  Patient presents with   Depression     HPI  [name] returns for follow up of depression. Pt is unsure if Trintellix has helped.  She is concerned about it contributing to nausea and also it interacting with xarelto.  Upon medication review, trintellix does increase bleeding risk and pt has issues with bleeding anyway. She is alreadyy taking xarelto qod.  GAD 7 - 10.  PHQ9 SCORE ONLY 06/30/2021 06/15/2021 02/21/2021  PHQ-9 Total Score _0 Pt continues to feel weak and tired.   Current Outpatient Medications on File Prior to Visit  Medication Sig Dispense Refill   albuterol (VENTOLIN HFA) 108 (90 Base) MCG/ACT inhaler INHALE 1 TO 2 PUFFS BY MOUTH EVERY 4 HOURS AS NEEDED FOR WHEEZING 9 g 0   aspirin 81 MG chewable tablet Chew 81 mg by mouth in the morning.      Blood Glucose Monitoring Suppl (ONETOUCH VERIO FLEX SYSTEM) w/Device KIT Check sugar once daily 1 kit 0   Budeson-Glycopyrrol-Formoterol (BREZTRI AEROSPHERE) 160-9-4.8 MCG/ACT AERO Inhale 2 puffs into the lungs 2 (two) times daily. 10.7 g 5   calcium-vitamin D (OSCAL WITH D) 500-200 MG-UNIT TABS tablet Take 1 tablet by mouth daily.     carvedilol (COREG) 12.5 MG tablet Take 12.5 mg by mouth 2 (two) times daily with a meal.     Cholecalciferol (VITAMIN D-3) 1000 UNITS CAPS Take 1,000 Units by mouth daily.     furosemide (LASIX) 20 MG tablet Take 1 tablet (20 mg total) by mouth daily. (Patient taking differently: Take 20 mg by mouth 2 (two) times daily.) 30 tablet 0   Glucagon, rDNA, (GLUCAGON EMERGENCY) 1 MG KIT Inject 1 kit as directed once for 1 dose. If sugars < 70, if you cannot increase with oral glucose. 1 kit 1   losartan (COZAAR) 25 MG tablet Take 25 mg by mouth daily.     lovastatin (MEVACOR) 40 MG tablet TAKE 1 TABLET BY MOUTH AT BEDTIME 90 tablet 0   Magnesium 250 MG TABS Take 250 mg by mouth  daily.     metFORMIN (GLUCOPHAGE) 500 MG tablet TAKE 1 TABLET BY MOUTH TWICE DAILY WITH MORNING MEAL AND WITH EVENING MEAL (Patient taking differently: 500 mg. TAKE 1 TABLET BY MOUTH every morning and 2 tablets with the EVENING MEAL) 180 tablet 0   Multiple Vitamins-Minerals (CENTRUM SILVER ULTRA WOMENS PO) Take by mouth daily.     nitroGLYCERIN (NITROSTAT) 0.4 MG SL tablet DISSOLVE ONE TABLET UNDER THE TONGUE EVERY 5 MINUTES AS NEEDED FOR CHEST PAIN.  DO NOT EXCEED A TOTAL OF 3 DOSES IN 15 MINUTES 25 tablet 0   OneTouch Delica Lancets 38V MISC USE 1  TO CHECK GLUCOSE ONCE DAILY *APPOINTMENT  REQUIRED  FOR  FUTURE  REFILLS 100 each 3   ONETOUCH VERIO test strip Use to check blood sugar once daily  DX CODE E11.9 100 each 1   polyethylene glycol (MIRALAX / GLYCOLAX) packet Take 17 g by mouth daily as needed.     Rivaroxaban (XARELTO) 15 MG TABS tablet Take 1 tablet (15 mg total) by mouth daily. (Patient taking differently: Take 15 mg by mouth. Take 1 tablet every other day) 90 tablet 0   No current facility-administered medications on file prior to visit.   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[place])    Statin myopathy    Thyroid disease    Past Surgical History:  Procedure Laterality Date   ABDOMINAL HYSTERECTOMY  03/05/1998   pt does not think this was a total hysterectomy   APPENDECTOMY  12/31/1988   BREAST LUMPECTOMY Left 05/22/1996   pacemaker  09/27/2016   PACEMAKER REVISION  09/27/2020   PACEMAKER UPGRADE DUAL CHAMBER TO BIV    Family History  Problem Relation Age of Onset   High blood pressure  Mother    Diabetes Mother        no medication needed   Kidney disease Mother    Pneumonia Mother    High blood pressure Father    Cancer Maternal Grandmother        Leukemia   Cancer Other        Ovarian   Social History   Socioeconomic History   Marital status: Married    Spouse name: Darryl   Number of children: 2   Years of education: Not on file   Highest education level: 9th grade  Occupational History   Occupation: Retired  Tobacco Use   Smoking status: Never   Smokeless tobacco: Never  Vaping Use   Vaping Use: Never used  Substance and Sexual Activity   Alcohol use: Never   Drug use: Never   Sexual activity: Not on file  Other Topics Concern   Not on file  Social History Narrative   Lives at home with her husband   Right handed   Caffeine: mostly tea, 0-2 cups a day   Social Determinants of Health   Financial Resource Strain: High Risk   Difficulty of Paying Living Expenses: Very hard  Food Insecurity: No Food Insecurity   Worried About Charity fundraiser in the Last Year: Never true   Arboriculturist in the Last Year: Never true  Transportation Needs: No Transportation Needs   Lack of Transportation (Medical): No   Lack of Transportation (Non-Medical): No  Physical Activity: Not on file  Stress: Not on file  Social Connections: Not on file    Review of Systems  Constitutional:  Positive for fatigue and fever. Negative for chills.  HENT:  Positive for postnasal drip.   Respiratory:  Positive for cough. Negative for shortness of breath.   Cardiovascular:  Positive for chest pain (right-sided pain).  Gastrointestinal:  Positive for constipation, diarrhea and nausea. Negative for abdominal pain.  Neurological:  Positive for light-headedness and headaches (frontal headache).    Objective:  BP (!) 110/58   Pulse 76   Temp (!) 97.2 F (36.2 C)   Resp 16   Ht _0  (1.549 m)   Wt 103 lb 6.4 oz (46.9 kg)   BMI 19.54 kg/m   BP/Weight 06/30/2021  06/15/2021 1/88/4166  Systolic BP 063 016 010  Diastolic BP 58 58 58  Wt. (Lbs) 103.4 103.8 106  BMI 19.54 19.61 20.03    Physical Exam Vitals reviewed.  Constitutional:      Appearance: Normal appearance. She is normal weight.  Cardiovascular:     Rate and Rhythm: Normal rate and regular rhythm.     Heart sounds: Normal heart sounds.  Pulmonary:     Effort: Pulmonary effort is normal. No respiratory distress.     Breath sounds: Normal breath sounds.  Abdominal:     General: Abdomen is flat. Bowel sounds are normal.     Palpations: Abdomen is soft.     Tenderness: There is no abdominal tenderness.  Neurological:     Mental Status: She is alert and oriented to person, place, and time.  Psychiatric:        Behavior: Behavior normal.     Comments: depression    Diabetic Foot Exam - Simple   No data filed      Lab Results  Component Value Date   WBC 7.2 06/15/2021   HGB 12.1 06/15/2021   HCT 36.2 06/15/2021   PLT 184 06/15/2021   GLUCOSE 114 (H) 06/15/2021   CHOL 144 06/15/2021   TRIG 95 06/15/2021   HDL 69 06/15/2021   LDLDIRECT 94.2 12/05/2013   LDLCALC 58 06/15/2021   ALT 19 06/15/2021   AST 25 06/15/2021   NA 139 06/15/2021   K 4.3 06/15/2021   CL 99 06/15/2021   CREATININE 1.30 (H) 06/15/2021   BUN 34 (H) 06/15/2021   CO2 25 06/15/2021   TSH 1.64 03/23/2020   INR 1.3 (H) 11/12/2020   HGBA1C 6.6 (A) 03/25/2021   MICROALBUR 4.0 (H) 03/25/2021      Assessment & Plan:   Problem List Items Addressed This Visit       Hematopoietic and Hemostatic   Acquired thrombophilia ([place]) - Primary    Stop trintellix.        Other   History of DVT (deep vein thrombosis)    Needs to continue xarelto 15 mg qod.      Depression, major, recurrent, moderate ([place])    change trintellix to wellbutrin Change trintellix to wellbutrin xl 150 mg once in am x 1 week, then increase to 2 in am.  Follow up in 3-4 weeks. Likely already has a visit scheduled.         .  No orders of the defined types were placed in this encounter.   No orders of the defined types were placed in this encounter.    Follow-up: Return in about 4 weeks (around 07/28/2021) for chronic follow up.  An After Visit Summary was printed and given to the patient.  [name], MD [facility] [phone]

## 2021-07-06 ENCOUNTER — Telehealth: Payer: Self-pay

## 2021-07-06 ENCOUNTER — Other Ambulatory Visit: Payer: Self-pay | Admitting: Family Medicine

## 2021-07-06 MED ORDER — BUPROPION HCL ER (XL) 150 MG PO TB24: ORAL_TABLET | ORAL | 1 refills | Status: DC

## 2021-07-06 NOTE — Progress Notes (Unsigned)
Wellbutrin

## 2021-07-06 NOTE — Telephone Encounter (Signed)
-----   Message from Blane Ohara, MD sent at 07/06/2021  8:31 AM EST ----- Regarding: change trintellix to wellbutrin Change trintellix to wellbutrin xl 150 mg once in am x 1 week, then increase to 2 in am.  Follow up in 3-4 weeks. Likely already has a visit scheduled.

## 2021-07-06 NOTE — Telephone Encounter (Signed)
Called pt. Pt is agreeable to change but concerned for expense. Will like for [name] to send medication to [facility] in [place] if too expensive she will make [name] aware.   [name] 07/06/21 8:39 AM

## 2021-07-17 ENCOUNTER — Encounter: Payer: Self-pay | Admitting: Family Medicine

## 2021-07-17 DIAGNOSIS — D6869 Other thrombophilia: Secondary | ICD-10-CM | POA: Insufficient documentation

## 2021-07-17 NOTE — Assessment & Plan Note (Signed)
Stop trintellix.

## 2021-07-17 NOTE — Assessment & Plan Note (Signed)
Needs to continue xarelto 15 mg qod.

## 2021-07-17 NOTE — Assessment & Plan Note (Signed)
change trintellix to wellbutrin Change trintellix to wellbutrin xl 150 mg once in am x 1 week, then increase to 2 in am.  Follow up in 3-4 weeks. Likely already has a visit scheduled.

## 2021-07-18 ENCOUNTER — Telehealth: Payer: Self-pay

## 2021-07-18 NOTE — Chronic Care Management (AMB) (Signed)
Chronic Care Management Pharmacy Assistant   Name: Bridget [name]  MRN: [id number] DOB: [birth year]   Reason for Encounter: Disease State call for DM    Recent office visits:  07/06/21 Orders Only. Cox, Kirsten MD. Change trintellix to wellbutrin xl 150 mg once in am x 1 week, then increase to 2 in am.   06/30/21 [name] MD. Seen for Acquired Thrombophilia. D/C Venlafaxine HCI 75 mg. Change trintellix to wellbutrin xl 150 mg once in am x 1 week, then increase to 2 in am.   Recent consult visits:  None   Hospital visits:  None   Medications: Outpatient Encounter Medications as of 07/18/2021  Medication Sig   albuterol (VENTOLIN HFA) 108 (90 Base) MCG/ACT inhaler INHALE 1 TO 2 PUFFS BY MOUTH EVERY 4 HOURS AS NEEDED FOR WHEEZING   aspirin 81 MG chewable tablet Chew 81 mg by mouth in the morning.    Blood Glucose Monitoring Suppl (ONETOUCH VERIO FLEX SYSTEM) w/Device KIT Check sugar once daily   Budeson-Glycopyrrol-Formoterol (BREZTRI AEROSPHERE) 160-9-4.8 MCG/ACT AERO Inhale 2 puffs into the lungs 2 (two) times daily.   buPROPion (WELLBUTRIN XL) 150 MG 24 hr tablet Take one tablet by mouth for one week in AM. Then take two tablets by mouth daily in AM.   calcium-vitamin D (OSCAL WITH D) 500-200 MG-UNIT TABS tablet Take 1 tablet by mouth daily.   carvedilol (COREG) 12.5 MG tablet Take 12.5 mg by mouth 2 (two) times daily with a meal.   Cholecalciferol (VITAMIN D-3) 1000 UNITS CAPS Take 1,000 Units by mouth daily.   furosemide (LASIX) 20 MG tablet Take 1 tablet (20 mg total) by mouth daily. (Patient taking differently: Take 20 mg by mouth 2 (two) times daily.)   Glucagon, rDNA, (GLUCAGON EMERGENCY) 1 MG KIT Inject 1 kit as directed once for 1 dose. If sugars < 70, if you cannot increase with oral glucose.   losartan (COZAAR) 25 MG tablet Take 25 mg by mouth daily.   lovastatin (MEVACOR) 40 MG tablet TAKE 1 TABLET BY MOUTH AT BEDTIME   Magnesium 250 MG TABS Take 250 mg by  mouth daily.   metFORMIN (GLUCOPHAGE) 500 MG tablet TAKE 1 TABLET BY MOUTH TWICE DAILY WITH MORNING MEAL AND WITH EVENING MEAL (Patient taking differently: 500 mg. TAKE 1 TABLET BY MOUTH every morning and 2 tablets with the EVENING MEAL)   Multiple Vitamins-Minerals (CENTRUM SILVER ULTRA WOMENS PO) Take by mouth daily.   nitroGLYCERIN (NITROSTAT) 0.4 MG SL tablet DISSOLVE ONE TABLET UNDER THE TONGUE EVERY 5 MINUTES AS NEEDED FOR CHEST PAIN.  DO NOT EXCEED A TOTAL OF 3 DOSES IN 15 MINUTES   OneTouch Delica Lancets 52W MISC USE 1  TO CHECK GLUCOSE ONCE DAILY *APPOINTMENT  REQUIRED  FOR  FUTURE  REFILLS   ONETOUCH VERIO test strip Use to check blood sugar once daily  DX CODE E11.9   polyethylene glycol (MIRALAX / GLYCOLAX) packet Take 17 g by mouth daily as needed.   Rivaroxaban (XARELTO) 15 MG TABS tablet Take 1 tablet (15 mg total) by mouth daily. (Patient taking differently: Take 15 mg by mouth. Take 1 tablet every other day)   No facility-administered encounter medications on file as of 07/18/2021.   Recent Relevant Labs: Lab Results  Component Value Date/Time   HGBA1C 6.6 (A) 03/25/2021 08:29 AM   HGBA1C 6.7 (H) 10/22/2020 09:01 AM   HGBA1C 6.0 (A) 09/24/2020 09:30 AM   HGBA1C 6.3 (H) 11/20/2019 09:31 AM  MICROALBUR 4.0 (H) 03/25/2021 08:59 AM   MICROALBUR <0.7 03/23/2020 10:49 AM    Kidney Function Lab Results  Component Value Date/Time   CREATININE 1.30 (H) 06/15/2021 11:12 AM   CREATININE 1.33 (H) 05/03/2021 04:30 PM   GFR 32.91 (L) 03/23/2020 08:52 AM   GFRNONAA 60 09/08/2020 02:42 PM   GFRAA 69 09/08/2020 02:42 PM     Current antihyperglycemic regimen:  Metformin 500 mg 1 in am and two in pm   Patient verbally confirms she is taking the above medications as directed. Yes  What recent interventions/DTPs have been made to improve glycemic control:  Pt stated no changes   Have there been any recent hospitalizations or ED visits since last visit with CPP? No  Patient  denies hypoglycemic symptoms, including None  Patient denies hyperglycemic symptoms, including none  How often are you checking your blood sugar? once daily  What are your blood sugars ranging?  07/14/21 125, 07/15/21 135, 07/16/21 118,07/17/21 118, 07/18/21 106 Pt stated she has been feeling nauseous over the last week and has not eaten a whole lot.   On insulin? No  During the week, how often does your blood glucose drop below 70? Never  Are you checking your feet daily/regularly? Yes  Adherence Review: Is the patient currently on a STATIN medication? Yes Is the patient currently on ACE/ARB medication? Yes Does the patient have >5 day gap between last estimated fill dates? No  Care Gaps: Last eye exam / Retinopathy Screening: 01/12/21 Last Annual Wellness Visit: 01/13/21 Last Diabetic Foot Exam: 10/22/20   Star Rating Drugs:  Medication:  Last Fill: Day Supply Metformin   06/22/21 [place], [place] Pharmacist Assistant  [phone]

## 2021-07-19 ENCOUNTER — Other Ambulatory Visit: Payer: Self-pay

## 2021-07-19 MED ORDER — BREZTRI AEROSPHERE 160-9-4.8 MCG/ACT IN AERO: 2.0000 | INHALATION_SPRAY | Freq: Two times a day (BID) | RESPIRATORY_TRACT | 5 refills | Status: DC

## 2021-07-19 MED ORDER — BREZTRI AEROSPHERE 160-9-4.8 MCG/ACT IN AERO: 2.0000 | INHALATION_SPRAY | Freq: Two times a day (BID) | RESPIRATORY_TRACT | 3 refills | Status: DC

## 2021-07-20 ENCOUNTER — Telehealth: Payer: Self-pay

## 2021-07-20 DIAGNOSIS — E119 Type 2 diabetes mellitus without complications: Secondary | ICD-10-CM

## 2021-07-20 MED ORDER — ONETOUCH VERIO VI STRP: ORAL_STRIP | 1 refills | Status: DC

## 2021-07-20 NOTE — Telephone Encounter (Signed)
Called patient to check in on her to see if she was feeling any better. States she still feels, "Down" and when I offered to discuss this with her or get her set up for an earlier appt with [name] she said, "I can't right now, I'm on my way to the heart doctor for my husband." I told her to call us if she changes her mind

## 2021-07-20 NOTE — Telephone Encounter (Signed)
Rx refill sent.

## 2021-07-21 ENCOUNTER — Telehealth: Payer: Self-pay

## 2021-07-21 NOTE — Telephone Encounter (Signed)
Patient calling complaining of n/v x3 days. She began wellbutrin 150 mg in AM on 11/23. About two weeks ago she had one day of diarrhea. She began feeling some better. Starting this Tuesday she is nauseous w/ vomiting x3 episodes. She has been able to keep some bland food down but w/ nausea. Has had 2 glasses of fluids today, feels her output has dropped to half than normal. She is not sure of what she should do. Also questioning if she should continue lasix during this. Please advise.   Lorita Officer, [place] 07/21/21 3:05 PM

## 2021-07-21 NOTE — Telephone Encounter (Signed)
Patient scheduled. Bridget [name]

## 2021-07-22 ENCOUNTER — Other Ambulatory Visit: Payer: Self-pay

## 2021-07-22 ENCOUNTER — Ambulatory Visit (INDEPENDENT_AMBULATORY_CARE_PROVIDER_SITE_OTHER): Payer: Medicare Other | Admitting: Family Medicine

## 2021-07-22 VITALS — BP 106/70 | HR 88 | Temp 97.3°F | Resp 16 | Wt 100.0 lb

## 2021-07-22 DIAGNOSIS — R5383 Other fatigue: Secondary | ICD-10-CM | POA: Diagnosis not present

## 2021-07-22 DIAGNOSIS — R112 Nausea with vomiting, unspecified: Secondary | ICD-10-CM

## 2021-07-22 DIAGNOSIS — N3000 Acute cystitis without hematuria: Secondary | ICD-10-CM

## 2021-07-22 DIAGNOSIS — R413 Other amnesia: Secondary | ICD-10-CM

## 2021-07-22 DIAGNOSIS — F331 Major depressive disorder, recurrent, moderate: Secondary | ICD-10-CM | POA: Diagnosis not present

## 2021-07-22 LAB — POCT URINALYSIS DIPSTICK
Blood, UA: NEGATIVE
Glucose, UA: NEGATIVE
Ketones, UA: NEGATIVE
Nitrite, UA: NEGATIVE
Protein, UA: POSITIVE — AB
Spec Grav, UA: 1.015 (ref 1.010–1.025)
Urobilinogen, UA: 0.2 E.U./dL
pH, UA: 5.5 (ref 5.0–8.0)

## 2021-07-22 MED ORDER — ONDANSETRON HCL 4 MG PO TABS: 4.0000 mg | ORAL_TABLET | Freq: Three times a day (TID) | ORAL | 0 refills | Status: DC | PRN

## 2021-07-22 MED ORDER — NITROFURANTOIN MONOHYD MACRO 100 MG PO CAPS: 100.0000 mg | ORAL_CAPSULE | Freq: Two times a day (BID) | ORAL | 0 refills | Status: DC

## 2021-07-22 NOTE — Patient Instructions (Addendum)
Memory loss: Obtaining a CT scan of the brain next week. Checking labs.   Hypertension: Blood pressures actually been too low.  Patient currently on appropriate dose of blood pressure medicine.  Depression: Recommend lower Wellbutrin XL(Bupropion)  to 150 mg 1/2 tablet daily in AM. If unable to cut in 1/2, stop medicine.   Nausea/Vomiting: Push fluids.Zofran 4 mg one every 6 hrs as need for nausea and vomiting.  Recommend glucerna shakes at least once daily.

## 2021-07-22 NOTE — Progress Notes (Signed)
Acute Office Visit  Subjective:    Patient ID: Bridget [name], female    DOB: [birth year], 80 y.o.   MRN: [id number]  Chief Complaint  Patient presents with   Nausea   Vomiting   Diarrhea    HPI: Patient is in today for nausea, vomiting, and diarrhea since Tuesday. Husband says she has also been confused. Started on wellbutrin xl 150 mg once daily on 11/27. She then increased her medicine on this past Tuesday. Symptoms worsened.  Hypertension: Pt was taking her bp medicine, carvedilol 1 1/2 tablets BID despite this medication being changed in October 2022 to 1/2 pill BID. Pt was supposed to be taking 12.5 mg one twice a day. Pt was complaining of dizziness. Confused for a while. Poor memory. Unable to find words.   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[place])    Statin myopathy    Thyroid disease     Past Surgical History:  Procedure Laterality Date   ABDOMINAL HYSTERECTOMY  03/05/1998   pt does not think this was a total hysterectomy   APPENDECTOMY  12/31/1988   BREAST LUMPECTOMY Left 05/22/1996   pacemaker  09/27/2016   PACEMAKER REVISION  09/27/2020   PACEMAKER UPGRADE DUAL CHAMBER TO BIV    Family History  Problem Relation Age of Onset   High blood pressure Mother    Diabetes Mother        no medication needed   Kidney disease Mother    Pneumonia Mother    High blood pressure Father    Cancer Maternal Grandmother        Leukemia   Cancer Other        Ovarian    Social History   Socioeconomic History   Marital status: Married    Spouse name: Darryl   Number of children:  2   Years of education: Not on file   Highest education level: 9th grade  Occupational History   Occupation: Retired  Tobacco Use   Smoking status: Never   Smokeless tobacco: Never  Vaping Use   Vaping Use: Never used  Substance and Sexual Activity   Alcohol use: Never   Drug use: Never   Sexual activity: Not on file  Other Topics Concern   Not on file  Social History Narrative   Lives at home with her husband   Right handed   Caffeine: mostly tea, 0-2 cups a day   Social Determinants of Health   Financial Resource Strain: High Risk   Difficulty of Paying Living Expenses: Very hard  Food Insecurity: No Food Insecurity   Worried About Charity fundraiser in the Last Year: Never true   Arboriculturist in the Last Year: Never true  Transportation Needs: No Transportation Needs   Lack of Transportation (Medical): No   Lack of Transportation (Non-Medical): No  Physical Activity: Not on file  Stress: Not on file  Social Connections: Not on file  Intimate Partner Violence: Not At Risk   Fear of Current or  Ex-Partner: No   Emotionally Abused: No   Physically Abused: No   Sexually Abused: No    Outpatient Medications Prior to Visit  Medication Sig Dispense Refill   albuterol (VENTOLIN HFA) 108 (90 Base) MCG/ACT inhaler INHALE 1 TO 2 PUFFS BY MOUTH EVERY 4 HOURS AS NEEDED FOR WHEEZING 9 g 0   aspirin 81 MG chewable tablet Chew 81 mg by mouth in the morning.      Blood Glucose Monitoring Suppl (ONETOUCH VERIO FLEX SYSTEM) w/Device KIT Check sugar once daily 1 kit 0   Budeson-Glycopyrrol-Formoterol (BREZTRI AEROSPHERE) 160-9-4.8 MCG/ACT AERO Inhale 2 puffs into the lungs 2 (two) times daily. 32.1 g 3   buPROPion (WELLBUTRIN XL) 150 MG 24 hr tablet Take one tablet by mouth for one week in AM. Then take two tablets by mouth daily in AM. 60 tablet 1   calcium-vitamin D (OSCAL WITH D) 500-200 MG-UNIT TABS tablet Take 1 tablet by mouth daily.     carvedilol (COREG) 12.5 MG tablet Take  12.5 mg by mouth 2 (two) times daily with a meal.     Cholecalciferol (VITAMIN D-3) 1000 UNITS CAPS Take 1,000 Units by mouth daily.     furosemide (LASIX) 20 MG tablet Take 1 tablet (20 mg total) by mouth daily. (Patient taking differently: Take 20 mg by mouth 2 (two) times daily.) 30 tablet 0   Glucagon, rDNA, (GLUCAGON EMERGENCY) 1 MG KIT Inject 1 kit as directed once for 1 dose. If sugars < 70, if you cannot increase with oral glucose. 1 kit 1   losartan (COZAAR) 25 MG tablet Take 25 mg by mouth daily.     lovastatin (MEVACOR) 40 MG tablet TAKE 1 TABLET BY MOUTH AT BEDTIME 90 tablet 0   Magnesium 250 MG TABS Take 250 mg by mouth daily.     metFORMIN (GLUCOPHAGE) 500 MG tablet TAKE 1 TABLET BY MOUTH TWICE DAILY WITH MORNING MEAL AND WITH EVENING MEAL (Patient taking differently: 500 mg. TAKE 1 TABLET BY MOUTH every morning and 2 tablets with the EVENING MEAL) 180 tablet 0   Multiple Vitamins-Minerals (CENTRUM SILVER ULTRA WOMENS PO) Take by mouth daily.     nitroGLYCERIN (NITROSTAT) 0.4 MG SL tablet DISSOLVE ONE TABLET UNDER THE TONGUE EVERY 5 MINUTES AS NEEDED FOR CHEST PAIN.  DO NOT EXCEED A TOTAL OF 3 DOSES IN 15 MINUTES 25 tablet 0   OneTouch Delica Lancets 71G MISC USE 1  TO CHECK GLUCOSE ONCE DAILY *APPOINTMENT  REQUIRED  FOR  FUTURE  REFILLS 100 each 3   ONETOUCH VERIO test strip Use to check blood sugar once daily  DX CODE E11.9 100 each 1   polyethylene glycol (MIRALAX / GLYCOLAX) packet Take 17 g by mouth daily as needed.     Rivaroxaban (XARELTO) 15 MG TABS tablet Take 1 tablet (15 mg total) by mouth daily. (Patient taking differently: Take 15 mg by mouth. Take 1 tablet every other day) 90 tablet 0   No facility-administered medications prior to visit.    Allergies  Allergen Reactions   Atorvastatin Other (See Comments)    "muscle Cramps"   Budesonide Other (See Comments)    "fatigue"   Gabapentin Other (See Comments)    "fatigue"   Metoclopramide Other (See Comments)     "mayalgia"   Rosuvastatin Other (See Comments)    "muscle cramps"   Simvastatin Other (See Comments)    "muscle Cramps"   Sulfamethoxazole-Trimethoprim Rash    Review of Systems  Constitutional:  Positive  for fatigue.  HENT:  Positive for rhinorrhea.   Respiratory:  Positive for cough and shortness of breath.   Gastrointestinal:  Positive for diarrhea, nausea and vomiting. Negative for abdominal pain.  Musculoskeletal:  Positive for back pain.  Neurological:  Positive for weakness and headaches.      Objective:    Physical Exam Vitals reviewed.  Constitutional:      Appearance: Normal appearance.  Neck:     Vascular: No carotid bruit.  Cardiovascular:     Rate and Rhythm: Normal rate and regular rhythm.     Heart sounds: Normal heart sounds.  Pulmonary:     Effort: Pulmonary effort is normal. No respiratory distress.     Breath sounds: Normal breath sounds.  Abdominal:     General: Abdomen is flat. Bowel sounds are normal.     Palpations: Abdomen is soft.     Tenderness: There is no abdominal tenderness.  Neurological:     Mental Status: She is alert and oriented to person, place, and time.  Psychiatric:        Mood and Affect: Mood normal.        Behavior: Behavior normal.    BP 106/70   Pulse 88   Temp (!) 97.3 F (36.3 C)   Resp 16   Wt 100 lb (45.4 kg)   BMI 18.89 kg/m  Wt Readings from Last 3 Encounters:  07/22/21 100 lb (45.4 kg)  06/30/21 103 lb 6.4 oz (46.9 kg)  06/15/21 103 lb 12.8 oz (47.1 kg)    Health Maintenance Due  Topic Date Due   Hepatitis C Screening  Never done   Zoster Vaccines- Shingrix (1 of 2) Never done   COVID-19 Vaccine (5 - Booster for Pfizer series) 02/28/2021    There are no preventive care reminders to display for this patient.   Lab Results  Component Value Date   TSH 2.040 07/22/2021   Lab Results  Component Value Date   WBC 8.8 07/22/2021   HGB 12.4 07/22/2021   HCT 37.4 07/22/2021   MCV 90 07/22/2021   PLT  226 07/22/2021   Lab Results  Component Value Date   NA 137 07/22/2021   K 3.9 07/22/2021   CO2 24 07/22/2021   GLUCOSE 128 (H) 07/22/2021   BUN 30 (H) 07/22/2021   CREATININE 1.55 (H) 07/22/2021   BILITOT 0.6 07/22/2021   ALKPHOS 114 07/22/2021   AST 25 07/22/2021   ALT 18 07/22/2021   PROT 6.4 07/22/2021   ALBUMIN 4.0 07/22/2021   CALCIUM 9.3 07/22/2021   EGFR 34 (L) 07/22/2021   GFR 32.91 (L) 03/23/2020   Lab Results  Component Value Date   CHOL 144 06/15/2021   Lab Results  Component Value Date   HDL 69 06/15/2021   Lab Results  Component Value Date   LDLCALC 58 06/15/2021   Lab Results  Component Value Date   TRIG 95 06/15/2021   Lab Results  Component Value Date   CHOLHDL 2.1 06/15/2021   Lab Results  Component Value Date   HGBA1C 6.6 (A) 03/25/2021       Assessment & Plan:   Problem List Items Addressed This Visit       Digestive   Nausea and vomiting    Concerned it was due to overmedications with coreg.  Pt may have been hypotensive. Hopefully will resolve with correct dosing of medication. Push fluids. Zofran 4 mg one every 6 hrs as need for nausea and vomiting.  Recommend glucerna shakes at least once daily.          Genitourinary   Acute cystitis without hematuria    Start on macrobid.       Relevant Orders   POCT urinalysis dipstick (Completed)   Urine Culture     Other   Depression, major, recurrent, moderate (HCC)    Recommend lower Wellbutrin XL(Bupropion)  to 150 mg 1/2 tablet daily in AM. If unable to cut in 1/2, stop medicine.       Memory loss - Primary    Check labs.       Relevant Orders   CT HEAD WO CONTRAST (5MM)   B12 and Folate Panel (Completed)   Methylmalonic acid, serum (Completed)   TSH (Completed)   Other fatigue    Check labs.       Relevant Orders   CBC with Differential/Platelet (Completed)   Comprehensive metabolic panel (Completed)   Meds ordered this encounter  Medications   ondansetron  (ZOFRAN) 4 MG tablet    Sig: Take 1 tablet (4 mg total) by mouth every 8 (eight) hours as needed for nausea or vomiting.    Dispense:  40 tablet    Refill:  0   nitrofurantoin, macrocrystal-monohydrate, (MACROBID) 100 MG capsule    Sig: Take 1 capsule (100 mg total) by mouth 2 (two) times daily.    Dispense:  14 capsule    Refill:  0    Orders Placed This Encounter  Procedures   Urine Culture   CT HEAD WO CONTRAST (5MM)   B12 and Folate Panel   Methylmalonic acid, serum   CBC with Differential/Platelet   Comprehensive metabolic panel   TSH   POCT urinalysis dipstick     Follow-up: Return in about 1 week (around 07/29/2021).  An After Visit Summary was printed and given to the patient.  [name], MD [facility] [phone]

## 2021-07-25 ENCOUNTER — Encounter: Payer: Self-pay | Admitting: Family Medicine

## 2021-07-25 DIAGNOSIS — N3000 Acute cystitis without hematuria: Secondary | ICD-10-CM | POA: Insufficient documentation

## 2021-07-25 DIAGNOSIS — R5383 Other fatigue: Secondary | ICD-10-CM | POA: Insufficient documentation

## 2021-07-25 DIAGNOSIS — R112 Nausea with vomiting, unspecified: Secondary | ICD-10-CM | POA: Insufficient documentation

## 2021-07-25 DIAGNOSIS — R413 Other amnesia: Secondary | ICD-10-CM | POA: Insufficient documentation

## 2021-07-25 LAB — CBC WITH DIFFERENTIAL/PLATELET
Basophils Absolute: 0 10*3/uL (ref 0.0–0.2)
Basos: 1 %
EOS (ABSOLUTE): 0.1 10*3/uL (ref 0.0–0.4)
Eos: 1 %
Hematocrit: 37.4 % (ref 34.0–46.6)
Hemoglobin: 12.4 g/dL (ref 11.1–15.9)
Immature Grans (Abs): 0.1 10*3/uL (ref 0.0–0.1)
Immature Granulocytes: 1 %
Lymphocytes Absolute: 1.6 10*3/uL (ref 0.7–3.1)
Lymphs: 18 %
MCH: 29.7 pg (ref 26.6–33.0)
MCHC: 33.2 g/dL (ref 31.5–35.7)
MCV: 90 fL (ref 79–97)
Monocytes Absolute: 0.5 10*3/uL (ref 0.1–0.9)
Monocytes: 5 %
Neutrophils Absolute: 6.5 10*3/uL (ref 1.4–7.0)
Neutrophils: 74 %
Platelets: 226 10*3/uL (ref 150–450)
RBC: 4.17 x10E6/uL (ref 3.77–5.28)
RDW: 12.6 % (ref 11.7–15.4)
WBC: 8.8 10*3/uL (ref 3.4–10.8)

## 2021-07-25 LAB — B12 AND FOLATE PANEL
Folate: 19.8 ng/mL (ref >3.0)
Vitamin B-12: 693 pg/mL (ref 232–1245)

## 2021-07-25 LAB — COMPREHENSIVE METABOLIC PANEL
ALT: 18 IU/L (ref 0–32)
AST: 25 IU/L (ref 0–40)
Albumin/Globulin Ratio: 1.7 (ref 1.2–2.2)
Albumin: 4 g/dL (ref 3.7–4.7)
Alkaline Phosphatase: 114 IU/L (ref 44–121)
BUN/Creatinine Ratio: 19 (ref 12–28)
BUN: 30 mg/dL — ABNORMAL HIGH (ref 8–27)
Bilirubin Total: 0.6 mg/dL (ref 0.0–1.2)
CO2: 24 mmol/L (ref 20–29)
Calcium: 9.3 mg/dL (ref 8.7–10.3)
Chloride: 96 mmol/L (ref 96–106)
Creatinine, Ser: 1.55 mg/dL — ABNORMAL HIGH (ref 0.57–1.00)
Globulin, Total: 2.4 g/dL (ref 1.5–4.5)
Glucose: 128 mg/dL — ABNORMAL HIGH (ref 70–99)
Potassium: 3.9 mmol/L (ref 3.5–5.2)
Sodium: 137 mmol/L (ref 134–144)
Total Protein: 6.4 g/dL (ref 6.0–8.5)
eGFR: 34 mL/min/{1.73_m2} — ABNORMAL LOW (ref >59)

## 2021-07-25 LAB — METHYLMALONIC ACID, SERUM: Methylmalonic Acid: 224 nmol/L (ref 0–378)

## 2021-07-25 LAB — TSH: TSH: 2.04 u[IU]/mL (ref 0.450–4.500)

## 2021-07-25 NOTE — Assessment & Plan Note (Signed)
Check labs 

## 2021-07-25 NOTE — Assessment & Plan Note (Signed)
Recommend lower Wellbutrin XL(Bupropion)  to 150 mg 1/2 tablet daily in AM. If unable to cut in 1/2, stop medicine.

## 2021-07-25 NOTE — Assessment & Plan Note (Signed)
Start on macrobid.

## 2021-07-25 NOTE — Progress Notes (Signed)
Patient ID: Bridget [name], female   DOB: [birth year], 80 y.o.   MRN: [id number]   Reason for Appointment: follow-up   History of Present Illness   Diagnosis: Type 2 DIABETES MELITUS, date of diagnosis:  2007     Previous history: Her A1c at diagnosis was 7.9% She has been treated with a combination of metformin ER and Actos for several years Usually her A1c has been upper normal and her last A1c was 5.8 in 4/14  Recent history:  Her A1c is about the same at  6.6 compared to 6.7  She has been having difficulties with decreased appetite and some weight loss Again has had periodic admissions for CHF She mostly checks blood sugars fasting despite reminders to check readings after meals Since her fasting blood sugars were previously high she was told to increase her metformin in the evening to 2 tablets which she has done Although she has had some diarrhea this is only once or twice a month and no abdominal pain Overall blood sugars are similar to her last visit although having some better readings recently She was told to discuss adding [place] or Jardiance with her cardiologist on her last visit but she has not been back for a visit She is only limited physical activity because of her shortness of breath He is mostly eating cereal or oatmeal for breakfast and does not check readings after meals  Generally she is reluctant to take any brand-name medication because of cost     Oral hypoglycemic drugs:   metformin  500 mg, 1 tablet a.m. and 2 tablets in the evening         Side effects from medications:  Abdominal discomfort from high-dose metformin       Monitors blood glucose:  once a day or less.    Glucometer: One Touch Ultra.           Blood Glucose readings from monitor download:    PRE-MEAL Fasting Lunch Dinner Bedtime Overall  Glucose range: 106-172  111-130  106-172  Mean/median: 129    129   Previously:  PRE-MEAL Fasting Lunch Dinner Bedtime Overall   Glucose range: 117-144    85-164  Mean/median: 126       POST-MEAL PC Breakfast PC Lunch PC Dinner  Glucose range:   164  Mean/median:        Meals: 3 meals per day. Usually follows a healthy diet, portions controlled             Dietician visit: Most recent: 2007            Wt Readings from Last 3 Encounters:  07/26/21 99 lb (44.9 kg)  07/22/21 100 lb (45.4 kg)  06/30/21 103 lb 6.4 oz (46.9 kg)   Lab Results  Component Value Date   HGBA1C 6.6 (A) 03/25/2021   HGBA1C 6.7 (H) 10/22/2020   HGBA1C 6.0 (A) 09/24/2020   Lab Results  Component Value Date   MICROALBUR 4.0 (H) 03/25/2021   LDLCALC 58 06/15/2021   CREATININE 1.55 (H) 07/22/2021   Other active problems discussed: See review of systems   Allergies as of 07/26/2021       Reactions   Atorvastatin Other (See Comments)   "muscle Cramps"   Budesonide Other (See Comments)   "fatigue"   Gabapentin Other (See Comments)   "fatigue"   Metoclopramide Other (See Comments)   "mayalgia"   Rosuvastatin Other (See Comments)   "muscle cramps"   Simvastatin Other (  See Comments)   "muscle Cramps"   Sulfamethoxazole-trimethoprim Rash        Medication List        Accurate as of July 26, 2021  8:33 AM. If you have any questions, ask your nurse or doctor.          albuterol 108 (90 Base) MCG/ACT inhaler Commonly known as: VENTOLIN HFA INHALE 1 TO 2 PUFFS BY MOUTH EVERY 4 HOURS AS NEEDED FOR WHEEZING   aspirin 81 MG chewable tablet Chew 81 mg by mouth in the morning.   Breztri Aerosphere 160-9-4.8 MCG/ACT Aero Generic drug: Budeson-Glycopyrrol-Formoterol Inhale 2 puffs into the lungs 2 (two) times daily.   buPROPion 150 MG 24 hr tablet Commonly known as: Wellbutrin XL Take one tablet by mouth for one week in AM. Then take two tablets by mouth daily in AM.   calcium-vitamin D 500-200 MG-UNIT Tabs tablet Commonly known as: OSCAL WITH D Take 1 tablet by mouth daily.   carvedilol 12.5 MG  tablet Commonly known as: COREG Take 12.5 mg by mouth 2 (two) times daily with a meal.   CENTRUM SILVER ULTRA WOMENS PO Take by mouth daily.   furosemide 20 MG tablet Commonly known as: LASIX Take 1 tablet (20 mg total) by mouth daily. What changed: when to take this   Glucagon Emergency 1 MG Kit Inject 1 kit as directed once for 1 dose. If sugars < 70, if you cannot increase with oral glucose.   losartan 25 MG tablet Commonly known as: COZAAR Take 25 mg by mouth daily.   lovastatin 40 MG tablet Commonly known as: MEVACOR TAKE 1 TABLET BY MOUTH AT BEDTIME   Magnesium 250 MG Tabs Take 250 mg by mouth daily.   metFORMIN 500 MG tablet Commonly known as: GLUCOPHAGE TAKE 1 TABLET BY MOUTH TWICE DAILY WITH MORNING MEAL AND WITH EVENING MEAL What changed: See the new instructions.   nitrofurantoin (macrocrystal-monohydrate) 100 MG capsule Commonly known as: Macrobid Take 1 capsule (100 mg total) by mouth 2 (two) times daily.   nitroGLYCERIN 0.4 MG SL tablet Commonly known as: NITROSTAT DISSOLVE ONE TABLET UNDER THE TONGUE EVERY 5 MINUTES AS NEEDED FOR CHEST PAIN.  DO NOT EXCEED A TOTAL OF 3 DOSES IN 15 MINUTES   ondansetron 4 MG tablet Commonly known as: Zofran Take 1 tablet (4 mg total) by mouth every 8 (eight) hours as needed for nausea or vomiting.   OneTouch Delica Lancets 16W Misc USE 1  TO CHECK GLUCOSE ONCE DAILY *APPOINTMENT  REQUIRED  FOR  FUTURE  REFILLS   OneTouch Verio Flex System w/Device Kit Check sugar once daily   OneTouch Verio test strip Generic drug: glucose blood Use to check blood sugar once daily  DX CODE E11.9   polyethylene glycol 17 g packet Commonly known as: MIRALAX / GLYCOLAX Take 17 g by mouth daily as needed.   Rivaroxaban 15 MG Tabs tablet Commonly known as: Xarelto Take 1 tablet (15 mg total) by mouth daily. What changed:  when to take this additional instructions   Vitamin D-3 25 MCG (1000 UT) Caps Take 1,000 Units by mouth  daily.        Allergies:  Allergies  Allergen Reactions   Atorvastatin Other (See Comments)    "muscle Cramps"   Budesonide Other (See Comments)    "fatigue"   Gabapentin Other (See Comments)    "fatigue"   Metoclopramide Other (See Comments)    "mayalgia"   Rosuvastatin Other (See Comments)    "  muscle cramps"   Simvastatin Other (See Comments)    "muscle Cramps"   Sulfamethoxazole-Trimethoprim Rash    Past Medical History:  Diagnosis Date   Asthma    Atrioventricular block    Diabetes mellitus without complication (HCC)    Fibromyalgia    Heart disease    Hypertension    Insomnia    Kidney disease    Kidney stones 01/12/1983   Major depression    Major depressive disorder, recurrent, mild ([place])    Mixed hyperlipidemia    Osteoporosis 01/27/2019   Other amnesia    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[place])    Statin myopathy    Thyroid disease     Past Surgical History:  Procedure Laterality Date   ABDOMINAL HYSTERECTOMY  03/05/1998   pt does not think this was a total hysterectomy   APPENDECTOMY  12/31/1988   BREAST LUMPECTOMY Left 05/22/1996   pacemaker  09/27/2016   PACEMAKER REVISION  09/27/2020   PACEMAKER UPGRADE DUAL CHAMBER TO BIV    Family History  Problem Relation Age of Onset   High blood pressure Mother    Diabetes Mother        no medication needed   Kidney disease Mother    Pneumonia Mother    High blood pressure Father    Cancer Maternal Grandmother        Leukemia   Cancer Other        Ovarian    Social History:  reports that she has never smoked. She has never used smokeless tobacco. She reports that she does not drink alcohol and does not use drugs.  Review of Systems:  Hypertension:  blood pressure is treated with Coreg, amlodipine and, treated by other physicians  She we will periodically check her blood pressure at home  She has a history of CAD followed by cardiologist  Lipids: She has had statin  intolerance with at least atorvastatin, simvastatin and rosuvastatin She was prescribed lovastatin by her PCP, LDL is improved  Lab Results  Component Value Date   CHOL 144 06/15/2021   CHOL 178 02/21/2021   CHOL 220 (H) 10/22/2020   Lab Results  Component Value Date   HDL 69 06/15/2021   HDL 74 02/21/2021   HDL 78 10/22/2020   Lab Results  Component Value Date   LDLCALC 58 06/15/2021   LDLCALC 89 02/21/2021   LDLCALC 111 (H) 10/22/2020   Lab Results  Component Value Date   TRIG 95 06/15/2021   TRIG 85 02/21/2021   TRIG 182 (H) 10/22/2020   Lab Results  Component Value Date   CHOLHDL 2.1 06/15/2021   CHOLHDL 2.4 02/21/2021   CHOLHDL 2.8 10/22/2020   Lab Results  Component Value Date   LDLDIRECT 94.2 12/05/2013     THYROID:  She was previously on thyroid suppression because of her long-standing benign thyroid nodule.  She has had 2 biopsies done on this previously Because of low normal TSH her 50 mcg Synthroid was stopped in 10/14 TSH has been consistently normal subsequently Right thyroid nodule has been difficult to palpate  Her ultrasound last showed mostly cystic nodule in the right side about 1.6 cm  Lab Results  Component Value Date   TSH 2.040 07/22/2021   TSH 1.64 03/23/2020   TSH 1.40 01/02/2019   Last foot exam: 3/22    Examination:   BP 118/60   Pulse 87   Ht _0  (1.549 m)   Wt 99 lb (44.9  kg)   SpO2 96%   BMI 18.71 kg/m   Body mass index is 18.71 kg/m.    Indistinct nodule felt on the right medial lobe, about 1 cm, also about a 1 cm slightly firm nodule also felt on the lobe also   ASSESSMENT/ PLAN:   Diabetes type 2 on metformin 500 mg in the morning and 1000 mg in the evening daily as monotherapy  Her blood sugars are well controlled overall but fasting readings are mildly increased although not much change from last time A1c is still fairly good at 6.5  As usual she forgets to check readings after meals; however her intake  has been relatively low She was advised to add some protein to her cereal or oatmeal in the morning  She may be a potential candidate for an SGLT2 drug and she is asking about [place] Discussed how this would work and will need to adjust her diuretics when starting this as well as coordinate treatment with cardiologist  She will talk to her cardiologist about starting JARDIANCE, patient information given to her Since her kidney function is worse she will reduce her metformin in the evening to 1 tablet  Urine microalbumin previously normal  Renal dysfunction: Her last creatinine was worse last week and she is going to follow-up with her PCP Encourage her to increase her fluid intake but she may need adjustment of her diuretics  LIPIDS: She has CAD and is with lovastatin LDL appears to be improving   History of thyroid nodule: History of mostly cystic small stable nodule which is minimally palpable Likely also has a small benign nodule on the left side, given her history and her other significant problems will monitor this clinically  Thyroid levels have been consistently normal  There are no Patient Instructions on file for this visit.   Elayne Snare 07/26/2021, 8:33 AM

## 2021-07-25 NOTE — Assessment & Plan Note (Addendum)
Concerned it was due to overmedications with coreg.  Pt may have been hypotensive. Hopefully will resolve with correct dosing of medication. Push fluids. Zofran 4 mg one every 6 hrs as need for nausea and vomiting.  Recommend glucerna shakes at least once daily.

## 2021-07-26 ENCOUNTER — Other Ambulatory Visit: Payer: Self-pay

## 2021-07-26 ENCOUNTER — Telehealth: Payer: Self-pay | Admitting: Family Medicine

## 2021-07-26 ENCOUNTER — Ambulatory Visit (INDEPENDENT_AMBULATORY_CARE_PROVIDER_SITE_OTHER): Payer: Medicare Other | Admitting: Endocrinology

## 2021-07-26 VITALS — BP 118/60 | HR 87 | Ht 61.0 in | Wt 99.0 lb

## 2021-07-26 DIAGNOSIS — E041 Nontoxic single thyroid nodule: Secondary | ICD-10-CM

## 2021-07-26 DIAGNOSIS — R634 Abnormal weight loss: Secondary | ICD-10-CM | POA: Diagnosis not present

## 2021-07-26 DIAGNOSIS — E119 Type 2 diabetes mellitus without complications: Secondary | ICD-10-CM | POA: Diagnosis not present

## 2021-07-26 LAB — POCT GLYCOSYLATED HEMOGLOBIN (HGB A1C): Hemoglobin A1C: 6.5 % — AB (ref 4.0–5.6)

## 2021-07-26 LAB — URINE CULTURE

## 2021-07-26 NOTE — Telephone Encounter (Signed)
° °  Bridget [name] has been scheduled for the following appointment:  WHAT: HEAD CT WHERE: [facility] DATE: 08/03/21 TIME: 10:30 AM ARRIVAL TIME  Patient has been made aware.

## 2021-07-26 NOTE — Patient Instructions (Addendum)
Take 1 Metformin with food twice daily  Add protein like egg in am  Check on Jardiance with Dr Judithe Modest

## 2021-07-26 NOTE — Addendum Note (Signed)
Addended by: Eliseo Squires on: 07/26/2021 11:17 AM   Modules accepted: Orders

## 2021-07-27 NOTE — Progress Notes (Signed)
Subjective:  Patient ID: Bridget [name], female    DOB: [birth year]  Age: 80 y.o. MRN: [id number]  Chief Complaint  Patient presents with   Depression    4 week f/u    Depression follow up: Last visit held Wellbutrin XL 145m and started her on 1/2 pill daily.  She is confused stillm but her dizziness and lightheadedness improved. PHQ9 SCORE ONLY 07/28/2021 06/30/2021 06/15/2021  PHQ-9 Total Score 9 17 6    Taking lasix 20 mg one twice a day.     Current Outpatient Medications on File Prior to Visit  Medication Sig Dispense Refill   albuterol (VENTOLIN HFA) 108 (90 Base) MCG/ACT inhaler INHALE 1 TO 2 PUFFS BY MOUTH EVERY 4 HOURS AS NEEDED FOR WHEEZING 9 g 0   aspirin 81 MG chewable tablet Chew 81 mg by mouth in the morning.      Blood Glucose Monitoring Suppl (ONETOUCH VERIO FLEX SYSTEM) w/Device KIT Check sugar once daily 1 kit 0   Budeson-Glycopyrrol-Formoterol (BREZTRI AEROSPHERE) 160-9-4.8 MCG/ACT AERO Inhale 2 puffs into the lungs 2 (two) times daily. 32.1 g 3   buPROPion (WELLBUTRIN XL) 150 MG 24 hr tablet Take one tablet by mouth for one week in AM. Then take two tablets by mouth daily in AM. 60 tablet 1   calcium-vitamin D (OSCAL WITH D) 500-200 MG-UNIT TABS tablet Take 1 tablet by mouth daily.     carvedilol (COREG) 12.5 MG tablet Take 12.5 mg by mouth 2 (two) times daily with a meal.     Cholecalciferol (VITAMIN D-3) 1000 UNITS CAPS Take 1,000 Units by mouth daily.     furosemide (LASIX) 20 MG tablet Take 1 tablet (20 mg total) by mouth daily. (Patient taking differently: Take 20 mg by mouth 2 (two) times daily.) 30 tablet 0   Glucagon, rDNA, (GLUCAGON EMERGENCY) 1 MG KIT Inject 1 kit as directed once for 1 dose. If sugars < 70, if you cannot increase with oral glucose. 1 kit 1   losartan (COZAAR) 25 MG tablet Take 25 mg by mouth daily.     lovastatin (MEVACOR) 40 MG tablet TAKE 1 TABLET BY MOUTH AT BEDTIME 90 tablet 0   Magnesium 250 MG TABS Take 250 mg by mouth daily.      metFORMIN (GLUCOPHAGE) 500 MG tablet TAKE 1 TABLET BY MOUTH TWICE DAILY WITH MORNING MEAL AND WITH EVENING MEAL (Patient taking differently: 500 mg. TAKE 1 TABLET BY MOUTH every morning and 2 tablets with the EVENING MEAL) 180 tablet 0   Multiple Vitamins-Minerals (CENTRUM SILVER ULTRA WOMENS PO) Take by mouth daily.     nitrofurantoin, macrocrystal-monohydrate, (MACROBID) 100 MG capsule Take 1 capsule (100 mg total) by mouth 2 (two) times daily. 14 capsule 0   nitroGLYCERIN (NITROSTAT) 0.4 MG SL tablet DISSOLVE ONE TABLET UNDER THE TONGUE EVERY 5 MINUTES AS NEEDED FOR CHEST PAIN.  DO NOT EXCEED A TOTAL OF 3 DOSES IN 15 MINUTES 25 tablet 0   ondansetron (ZOFRAN) 4 MG tablet Take 1 tablet (4 mg total) by mouth every 8 (eight) hours as needed for nausea or vomiting. 40 tablet 0   OneTouch Delica Lancets 392EMISC USE 1  TO CHECK GLUCOSE ONCE DAILY *APPOINTMENT  REQUIRED  FOR  FUTURE  REFILLS 100 each 3   ONETOUCH VERIO test strip Use to check blood sugar once daily  DX CODE E11.9 100 each 1   polyethylene glycol (MIRALAX / GLYCOLAX) packet Take 17 g by mouth daily as needed.  Rivaroxaban (XARELTO) 15 MG TABS tablet Take 1 tablet (15 mg total) by mouth daily. (Patient taking differently: Take 15 mg by mouth. Take 1 tablet every other day) 90 tablet 0   No current facility-administered medications on file prior to visit.   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[place])    Statin myopathy    Thyroid disease    Past Surgical History:  Procedure Laterality Date   ABDOMINAL HYSTERECTOMY  03/05/1998   pt does not  think this was a total hysterectomy   APPENDECTOMY  12/31/1988   BREAST LUMPECTOMY Left 05/22/1996   pacemaker  09/27/2016   PACEMAKER REVISION  09/27/2020   PACEMAKER UPGRADE DUAL CHAMBER TO BIV    Family History  Problem Relation Age of Onset   High blood pressure Mother    Diabetes Mother        no medication needed   Kidney disease Mother    Pneumonia Mother    High blood pressure Father    Cancer Maternal Grandmother        Leukemia   Cancer Other        Ovarian   Social History   Socioeconomic History   Marital status: Married    Spouse name: Darryl   Number of children: 2   Years of education: Not on file   Highest education level: 9th grade  Occupational History   Occupation: Retired  Tobacco Use   Smoking status: Never   Smokeless tobacco: Never  Vaping Use   Vaping Use: Never used  Substance and Sexual Activity   Alcohol use: Never   Drug use: Never   Sexual activity: Not on file  Other Topics Concern   Not on file  Social History Narrative   Lives at home with her husband   Right handed   Caffeine: mostly tea, 0-2 cups a day   Social Determinants of Health   Financial Resource Strain: High Risk   Difficulty of Paying Living Expenses: Very hard  Food Insecurity: No Food Insecurity   Worried About Charity fundraiser in the Last Year: Never true   Arboriculturist in the Last Year: Never true  Transportation Needs: No Transportation Needs   Lack of Transportation (Medical): No   Lack of Transportation (Non-Medical): No  Physical Activity: Not on file  Stress: Not on file  Social Connections: Not on file    Review of Systems  Constitutional:  Positive for chills. Negative for appetite change, fatigue and fever.  HENT:  Positive for postnasal drip and rhinorrhea. Negative for congestion, ear pain, sinus pressure and sore throat.   Eyes:  Negative for pain.  Respiratory:  Positive for cough. Negative for chest tightness, shortness of breath and  wheezing.   Cardiovascular:  Positive for palpitations. Negative for chest pain.  Gastrointestinal:  Positive for diarrhea and nausea. Negative for abdominal pain, constipation and vomiting.  Genitourinary:  Negative for dysuria and hematuria.  Musculoskeletal:  Positive for back pain and myalgias. Negative for arthralgias and joint  swelling.  Skin:  Negative for rash.  Neurological:  Negative for dizziness, weakness and headaches.  Psychiatric/Behavioral:  Positive for dysphoric mood. The patient is not nervous/anxious.     Objective:  BP (!) 110/58    Pulse 88    Temp 98.3 F (36.8 C)    Resp 18    Ht 5' 1"  (1.549 m)    Wt 100 lb 9.6 oz (45.6 kg)    BMI 19.01 kg/m   BP/Weight 07/28/2021 07/26/2021 66/11/4032  Systolic BP 742 595 638  Diastolic BP 58 60 70  Wt. (Lbs) 100.6 99 100  BMI 19.01 18.71 18.89    Physical Exam Vitals reviewed.  Constitutional:      Appearance: Normal appearance. She is normal weight.  Neck:     Vascular: No carotid bruit.  Cardiovascular:     Rate and Rhythm: Normal rate and regular rhythm.     Pulses: Normal pulses.     Heart sounds: Normal heart sounds.  Pulmonary:     Effort: Pulmonary effort is normal. No respiratory distress.     Breath sounds: Normal breath sounds.  Abdominal:     General: Abdomen is flat. Bowel sounds are normal.     Palpations: Abdomen is soft.     Tenderness: There is no abdominal tenderness.  Neurological:     Mental Status: She is alert and oriented to person, place, and time.  Psychiatric:        Behavior: Behavior normal.     Comments: depression    Diabetic Foot Exam - Simple   No data filed      Lab Results  Component Value Date   WBC 8.8 07/22/2021   HGB 12.4 07/22/2021   HCT 37.4 07/22/2021   PLT 226 07/22/2021   GLUCOSE 128 (H) 07/22/2021   CHOL 144 06/15/2021   TRIG 95 06/15/2021   HDL 69 06/15/2021   LDLDIRECT 94.2 12/05/2013   LDLCALC 58 06/15/2021   ALT 18 07/22/2021   AST 25 07/22/2021    NA 137 07/22/2021   K 3.9 07/22/2021   CL 96 07/22/2021   CREATININE 1.55 (H) 07/22/2021   BUN 30 (H) 07/22/2021   CO2 24 07/22/2021   TSH 2.040 07/22/2021   INR 1.3 (H) 11/12/2020   HGBA1C 6.5 (A) 07/26/2021   MICROALBUR 4.0 (H) 03/25/2021      Assessment & Plan:   Problem List Items Addressed This Visit       Genitourinary   Acute renal insufficiency    Decrease lasix to 20 mg once daily.          Other   Depression, major, recurrent, moderate ([place]) - Primary    Start on vraylar 1.5 mg once daily.   Stop wellbutrin.     .   Follow-up: Return in about 3 weeks (around 08/18/2021) for chronic follow up.  An After Visit Summary was printed and given to the patient.   I,Lauren M Auman,acting as a scribe for [name], MD.,have documented all relevant documentation on the behalf of [name], MD,as directed by  [name], MD while in the presence of [name], MD.    [name], MD [place] [phone]

## 2021-07-28 ENCOUNTER — Other Ambulatory Visit: Payer: Self-pay

## 2021-07-28 ENCOUNTER — Ambulatory Visit (INDEPENDENT_AMBULATORY_CARE_PROVIDER_SITE_OTHER): Payer: Medicare Other | Admitting: Family Medicine

## 2021-07-28 VITALS — BP 110/58 | HR 88 | Temp 98.3°F | Resp 18 | Ht 61.0 in | Wt 100.6 lb

## 2021-07-28 DIAGNOSIS — N289 Disorder of kidney and ureter, unspecified: Secondary | ICD-10-CM

## 2021-07-28 DIAGNOSIS — F331 Major depressive disorder, recurrent, moderate: Secondary | ICD-10-CM | POA: Diagnosis not present

## 2021-07-28 NOTE — Patient Instructions (Addendum)
Decrease lasix to 20 mg once daily.   Start on vraylar 1.5 mg once daily.   Stop wellbutrin.

## 2021-08-03 DIAGNOSIS — R413 Other amnesia: Secondary | ICD-10-CM | POA: Diagnosis not present

## 2021-08-03 DIAGNOSIS — I639 Cerebral infarction, unspecified: Secondary | ICD-10-CM | POA: Diagnosis not present

## 2021-08-14 ENCOUNTER — Encounter: Payer: Self-pay | Admitting: Family Medicine

## 2021-08-14 DIAGNOSIS — N289 Disorder of kidney and ureter, unspecified: Secondary | ICD-10-CM | POA: Insufficient documentation

## 2021-08-14 NOTE — Assessment & Plan Note (Signed)
Decrease lasix to 20 mg once daily.

## 2021-08-14 NOTE — Assessment & Plan Note (Signed)
Start on vraylar 1.5 mg once daily.   Stop wellbutrin.

## 2021-08-17 ENCOUNTER — Other Ambulatory Visit: Payer: Self-pay

## 2021-08-17 DIAGNOSIS — R413 Other amnesia: Secondary | ICD-10-CM

## 2021-08-18 ENCOUNTER — Other Ambulatory Visit: Payer: Self-pay

## 2021-08-18 ENCOUNTER — Ambulatory Visit (INDEPENDENT_AMBULATORY_CARE_PROVIDER_SITE_OTHER): Payer: Medicare Other | Admitting: Family Medicine

## 2021-08-18 ENCOUNTER — Telehealth: Payer: Self-pay

## 2021-08-18 VITALS — BP 110/60 | HR 88 | Temp 98.0°F | Resp 18 | Ht 61.0 in | Wt 100.0 lb

## 2021-08-18 DIAGNOSIS — N289 Disorder of kidney and ureter, unspecified: Secondary | ICD-10-CM | POA: Diagnosis not present

## 2021-08-18 DIAGNOSIS — M791 Myalgia, unspecified site: Secondary | ICD-10-CM | POA: Diagnosis not present

## 2021-08-18 DIAGNOSIS — F332 Major depressive disorder, recurrent severe without psychotic features: Secondary | ICD-10-CM | POA: Diagnosis not present

## 2021-08-18 MED ORDER — VENLAFAXINE HCL ER 75 MG PO CP24: 75.0000 mg | ORAL_CAPSULE | Freq: Every day | ORAL | 0 refills | Status: DC

## 2021-08-18 NOTE — Progress Notes (Signed)
Chronic Care Management Pharmacy Assistant   Name: Bridget [name]  MRN: [id number] DOB: [birth year]   Reason for Encounter: Disease State call for DM    Recent office visits:  07/28/21 [name] MD. Seen for Depression. Notes state Decrease lasix to 20 mg once daily and Start Vraylar 1.5 mg daily and Stop Wellbutrin.   07/22/21 [name] MD. Seen for Nausea and Vomiting. Started on Macrobid 100 mg 2 times daily. Started on Ondansetron HCI 4 mg every 8 hours prn. Recommend lower Wellbutrin XL(Bupropion)  to 150 mg 1/2 tablet daily in AM. If unable to cut in 1/2, stop medicine  Recent consult visits:  07/26/21 (Endocrinology) Elayne Snare MD. Seen for DM. Instructed to take Metformin twice daily.   07/20/21 (Cardiology) Francia Greaves RN. Orders Only. Ordered Carvedilol 12.5 mg two times daily. D/C carvedilol 25 mg two times daily.   Hospital visits:  None   Medications: Outpatient Encounter Medications as of 08/18/2021  Medication Sig   albuterol (VENTOLIN HFA) 108 (90 Base) MCG/ACT inhaler INHALE 1 TO 2 PUFFS BY MOUTH EVERY 4 HOURS AS NEEDED FOR WHEEZING   aspirin 81 MG chewable tablet Chew 81 mg by mouth in the morning.    Blood Glucose Monitoring Suppl (ONETOUCH VERIO FLEX SYSTEM) w/Device KIT Check sugar once daily   Budeson-Glycopyrrol-Formoterol (BREZTRI AEROSPHERE) 160-9-4.8 MCG/ACT AERO Inhale 2 puffs into the lungs 2 (two) times daily.   buPROPion (WELLBUTRIN XL) 150 MG 24 hr tablet Take one tablet by mouth for one week in AM. Then take two tablets by mouth daily in AM.   calcium-vitamin D (OSCAL WITH D) 500-200 MG-UNIT TABS tablet Take 1 tablet by mouth daily.   carvedilol (COREG) 12.5 MG tablet Take 12.5 mg by mouth 2 (two) times daily with a meal.   Cholecalciferol (VITAMIN D-3) 1000 UNITS CAPS Take 1,000 Units by mouth daily.   furosemide (LASIX) 20 MG tablet Take 1 tablet (20 mg total) by mouth daily. (Patient taking differently: Take 20 mg by mouth 2 (two)  times daily.)   Glucagon, rDNA, (GLUCAGON EMERGENCY) 1 MG KIT Inject 1 kit as directed once for 1 dose. If sugars < 70, if you cannot increase with oral glucose.   losartan (COZAAR) 25 MG tablet Take 25 mg by mouth daily.   lovastatin (MEVACOR) 40 MG tablet TAKE 1 TABLET BY MOUTH AT BEDTIME   Magnesium 250 MG TABS Take 250 mg by mouth daily.   metFORMIN (GLUCOPHAGE) 500 MG tablet TAKE 1 TABLET BY MOUTH TWICE DAILY WITH MORNING MEAL AND WITH EVENING MEAL (Patient taking differently: 500 mg. TAKE 1 TABLET BY MOUTH every morning and 2 tablets with the EVENING MEAL)   Multiple Vitamins-Minerals (CENTRUM SILVER ULTRA WOMENS PO) Take by mouth daily.   nitrofurantoin, macrocrystal-monohydrate, (MACROBID) 100 MG capsule Take 1 capsule (100 mg total) by mouth 2 (two) times daily.   nitroGLYCERIN (NITROSTAT) 0.4 MG SL tablet DISSOLVE ONE TABLET UNDER THE TONGUE EVERY 5 MINUTES AS NEEDED FOR CHEST PAIN.  DO NOT EXCEED A TOTAL OF 3 DOSES IN 15 MINUTES   ondansetron (ZOFRAN) 4 MG tablet Take 1 tablet (4 mg total) by mouth every 8 (eight) hours as needed for nausea or vomiting.   OneTouch Delica Lancets 37T MISC USE 1  TO CHECK GLUCOSE ONCE DAILY *APPOINTMENT  REQUIRED  FOR  FUTURE  REFILLS   ONETOUCH VERIO test strip Use to check blood sugar once daily  DX CODE E11.9   polyethylene glycol (MIRALAX /  GLYCOLAX) packet Take 17 g by mouth daily as needed.   Rivaroxaban (XARELTO) 15 MG TABS tablet Take 1 tablet (15 mg total) by mouth daily. (Patient taking differently: Take 15 mg by mouth. Take 1 tablet every other day)   No facility-administered encounter medications on file as of 08/18/2021.    Recent Relevant Labs: Lab Results  Component Value Date/Time   HGBA1C 6.5 (A) 07/26/2021 08:30 AM   HGBA1C 6.6 (A) 03/25/2021 08:29 AM   HGBA1C 6.7 (H) 10/22/2020 09:01 AM   HGBA1C 6.3 (H) 11/20/2019 09:31 AM   MICROALBUR 4.0 (H) 03/25/2021 08:59 AM   MICROALBUR <0.7 03/23/2020 10:49 AM    Kidney Function Lab  Results  Component Value Date/Time   CREATININE 1.55 (H) 07/22/2021 10:05 AM   CREATININE 1.30 (H) 06/15/2021 11:12 AM   GFR 32.91 (L) 03/23/2020 08:52 AM   GFRNONAA 60 09/08/2020 02:42 PM   GFRAA 69 09/08/2020 02:42 PM     Current antihyperglycemic regimen:  Metformin 500 mg twice daily   Patient verbally confirms she is taking the above medications as directed. Yes  What recent interventions/DTPs have been made to improve glycemic control:  Her Endocrinologist advised her to take her Metformin twice daily instead of the 1 in am and 2 in pm   Have there been any recent hospitalizations or ED visits since last visit with CPP? No  Patient denies hypoglycemic symptoms  Patient reports hyperglycemic symptoms, including excessive thirst and polyuria but pt stated she is taking a fluid medication  How often are you checking your blood sugar? once daily  What are your blood sugars ranging?  [phone] before medications   On insulin? No  During the week, how often does your blood glucose drop below 70? Never  Are you checking your feet daily/regularly? Yes  Adherence Review: Is the patient currently on a STATIN medication? Yes Is the patient currently on ACE/ARB medication? Yes Does the patient have >5 day gap between last estimated fill dates? CPP to review  Care Gaps: Last eye exam / Retinopathy Screening: 01/12/21 Last Annual Wellness Visit: 01/13/21 Last Diabetic Foot Exam: 10/22/20  Star Rating Drugs:  Medication:  Last Fill: Day Supply Metformin                   06/22/21            60ds    04/13/21 [place]ars, [place] Pharmacist Assistant  [phone]

## 2021-08-18 NOTE — Progress Notes (Signed)
Subjective:  Patient ID: Bridget [name], female    DOB: 1941/01/17  Age: 81 y.o. MRN: [id number]  Chief Complaint  Patient presents with   Depression    HPI Depression: Patient was weaned off effexor, because she felt it was not helping. Started and titrated up trintellix, which did not help. I then changed trintellix to wellbutrin xl, but patient did not tolerate this, It made her feel off balance. I decreased it to 150 mg 1/2 daily. Patient returned a week later, but was still feeling off balance and she is a high fall risk anyway, so we discontinued this. I then started her on vraylar 1.5 mg once daily. I had done gene testing for guidance. Upon follow up today, patient would like to try to go back to effexor xr as this worked better than all others we have tried. Also requesting psychiatry referral which is a great idea.  Patient has been on sertraline in the past for several years prior to changing her to effexor xr. She thinks years ago she may have been on other ssris (nor sure which one...they sound familiar.)  PHQ9 SCORE ONLY 09/10/2021 09/10/2021 07/28/2021  PHQ-9 Total Score 17 6 9    Diabetes: sees endocrinology. Sugars 120-140s.  Creatinine bumped up to 1.55 in December. Needs repeat cmp today. GFR dropped to 34. Patient is trying to drink more water.   Current Outpatient Medications on File Prior to Visit  Medication Sig Dispense Refill   albuterol (VENTOLIN HFA) 108 (90 Base) MCG/ACT inhaler INHALE 1 TO 2 PUFFS BY MOUTH EVERY 4 HOURS AS NEEDED FOR WHEEZING 9 g 0   aspirin 81 MG chewable tablet Chew 81 mg by mouth in the morning.      Blood Glucose Monitoring Suppl (ONETOUCH VERIO FLEX SYSTEM) w/Device KIT Check sugar once daily 1 kit 0   Budeson-Glycopyrrol-Formoterol (BREZTRI AEROSPHERE) 160-9-4.8 MCG/ACT AERO Inhale 2 puffs into the lungs 2 (two) times daily. 32.1 g 3   calcium-vitamin D (OSCAL WITH D) 500-200 MG-UNIT TABS tablet Take 1 tablet by mouth daily.      carvedilol (COREG) 12.5 MG tablet Take 12.5 mg by mouth 2 (two) times daily with a meal.     Cholecalciferol (VITAMIN D-3) 1000 UNITS CAPS Take 1,000 Units by mouth daily.     furosemide (LASIX) 20 MG tablet Take 1 tablet (20 mg total) by mouth daily. (Patient taking differently: Take 20 mg by mouth 2 (two) times daily.) 30 tablet 0   Glucagon, rDNA, (GLUCAGON EMERGENCY) 1 MG KIT Inject 1 kit as directed once for 1 dose. If sugars < 70, if you cannot increase with oral glucose. 1 kit 1   losartan (COZAAR) 25 MG tablet Take 25 mg by mouth daily.     lovastatin (MEVACOR) 40 MG tablet TAKE 1 TABLET BY MOUTH AT BEDTIME 90 tablet 0   Magnesium 250 MG TABS Take 250 mg by mouth daily.     Multiple Vitamins-Minerals (CENTRUM SILVER ULTRA WOMENS PO) Take by mouth daily.     nitroGLYCERIN (NITROSTAT) 0.4 MG SL tablet DISSOLVE ONE TABLET UNDER THE TONGUE EVERY 5 MINUTES AS NEEDED FOR CHEST PAIN.  DO NOT EXCEED A TOTAL OF 3 DOSES IN 15 MINUTES 25 tablet 0   ondansetron (ZOFRAN) 4 MG tablet Take 1 tablet (4 mg total) by mouth every 8 (eight) hours as needed for nausea or vomiting. 40 tablet 0   OneTouch Delica Lancets 78G MISC USE 1  TO CHECK GLUCOSE ONCE DAILY *APPOINTMENT  REQUIRED  FOR  FUTURE  REFILLS 100 each 3   ONETOUCH VERIO test strip Use to check blood sugar once daily  DX CODE E11.9 100 each 1   polyethylene glycol (MIRALAX / GLYCOLAX) packet Take 17 g by mouth daily as needed.     Rivaroxaban (XARELTO) 15 MG TABS tablet Take 1 tablet (15 mg total) by mouth daily. (Patient taking differently: Take 15 mg by mouth. Take 1 tablet every other day) 90 tablet 0   No current facility-administered medications on file prior to visit.   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[place])    Statin myopathy    Thyroid disease    Past Surgical History:  Procedure Laterality Date   ABDOMINAL HYSTERECTOMY  03/05/1998   pt does not think this was a total hysterectomy   APPENDECTOMY  12/31/1988   BREAST LUMPECTOMY Left 05/22/1996   pacemaker  09/27/2016   PACEMAKER REVISION  09/27/2020   PACEMAKER UPGRADE DUAL CHAMBER TO BIV    Family History  Problem Relation Age of Onset   High blood pressure Mother    Diabetes Mother        no medication needed   Kidney disease Mother    Pneumonia Mother    High blood pressure Father    Cancer Maternal Grandmother        Leukemia   Cancer Other        Ovarian   Social History   Socioeconomic History   Marital status: Married    Spouse name: Darryl   Number of children: 2   Years of education: Not on file   Highest education level: 9th grade  Occupational History   Occupation: Retired  Tobacco Use   Smoking status: Never   Smokeless tobacco: Never  Vaping Use   Vaping Use: Never used  Substance and Sexual Activity   Alcohol use: Never   Drug use: Never   Sexual activity: Not on file  Other Topics Concern   Not on file  Social History Narrative   Lives at home with her husband   Right handed   Caffeine: mostly tea, 0-2 cups a day   Social Determinants of Health   Financial Resource Strain: High Risk   Difficulty of Paying Living Expenses: Very hard  Food Insecurity: No Food Insecurity   Worried About Charity fundraiser in the Last Year: Never true   Arboriculturist in the Last Year: Never true  Transportation Needs: No Transportation Needs   Lack of Transportation (Medical): No   Lack of Transportation (Non-Medical): No  Physical Activity: Not on file  Stress: Not on file  Social Connections: Not on file    Review of Systems  Constitutional:   Positive for fatigue. Negative for chills and fever.  HENT:  Negative for congestion, rhinorrhea and sore throat.   Respiratory:  Positive for cough and shortness of breath.   Cardiovascular:  Negative for chest pain (right sided chest pain).  Gastrointestinal:  Negative for abdominal pain, blood  in stool, constipation, nausea and vomiting.  Genitourinary:  Negative for dysuria and urgency.  Musculoskeletal:  Positive for arthralgias (bilateral knee,hips and shoulder pain). Negative for back pain and myalgias.  Neurological:  Negative for dizziness, weakness, light-headedness and headaches.  Psychiatric/Behavioral:  Negative for dysphoric mood. The patient is not nervous/anxious.     Objective:  BP 110/60    Pulse 88    Temp 98 F (36.7 C)    Resp 18    Ht 5' 1"  (1.549 m)    Wt 100 lb (45.4 kg)    BMI 18.89 kg/m   BP/Weight 08/18/2021 07/28/2021 32/35/5732  Systolic BP 202 542 706  Diastolic BP 60 58 60  Wt. (Lbs) 100 100.6 99  BMI 18.89 19.01 18.71    Physical Exam Vitals reviewed.  Constitutional:      Appearance: Normal appearance. She is normal weight.  Neck:     Vascular: No carotid bruit.  Cardiovascular:     Rate and Rhythm: Normal rate and regular rhythm.     Heart sounds: Normal heart sounds.  Pulmonary:     Effort: Pulmonary effort is normal. No respiratory distress.     Breath sounds: Normal breath sounds.  Abdominal:     General: Abdomen is flat. Bowel sounds are normal.     Palpations: Abdomen is soft.     Tenderness: There is no abdominal tenderness.  Neurological:     Mental Status: She is alert and oriented to person, place, and time.  Psychiatric:        Behavior: Behavior normal.     Comments: Depressed. Flat affect.     Diabetic Foot Exam - Simple   No data filed      Lab Results  Component Value Date   WBC 8.8 07/22/2021   HGB 12.4 07/22/2021   HCT 37.4 07/22/2021   PLT 226 07/22/2021   GLUCOSE 122 (H) 08/18/2021   CHOL 144 06/15/2021    TRIG 95 06/15/2021   HDL 69 06/15/2021   LDLDIRECT 94.2 12/05/2013   LDLCALC 58 06/15/2021   ALT 28 08/18/2021   AST 31 08/18/2021   NA 140 08/18/2021   K 4.5 08/18/2021   CL 103 08/18/2021   CREATININE 1.24 (H) 08/18/2021   BUN 42 (H) 08/18/2021   CO2 23 08/18/2021   TSH 2.040 07/22/2021   INR 1.3 (H) 11/12/2020   HGBA1C 6.5 (A) 07/26/2021   MICROALBUR 4.0 (H) 03/25/2021      Assessment & Plan:   Problem List Items Addressed This Visit       Genitourinary   Acute renal insufficiency - Primary   Relevant Orders   Comprehensive metabolic panel (Completed)     Other   Severe episode of recurrent major depressive disorder, without psychotic features ([place])    Start venlafaxine 75 mg once daily in am. Stop vraylar. Refer to psychiatry.  Recommend counseling ([facility] or [facility].)       Relevant Medications   venlafaxine XR (EFFEXOR XR) 75 MG 24 hr capsule   Other Relevant Orders   Ambulatory referral to Psychiatry   Myalgia    Hold lovastatin, to see if any improvement in achiness.     .  Meds ordered this encounter  Medications   venlafaxine XR (EFFEXOR XR) 75 MG 24 hr capsule    Sig: Take 1 capsule (75 mg total) by mouth daily with breakfast.    Dispense:  30 capsule    Refill:  0  Orders Placed This Encounter  Procedures   Comprehensive metabolic panel   Ambulatory referral to Psychiatry     Follow-up: Return in about 3 weeks (around 09/08/2021).  An After Visit Summary was printed and given to the patient.  [name], MD [facility] [phone]

## 2021-08-18 NOTE — Patient Instructions (Addendum)
Depression: Start venlafaxine 75 mg once daily in am. Refer to psychiatry.  Recommend counseling ([facility] or [facility].)  Hold lovastatin.

## 2021-08-19 DIAGNOSIS — Z950 Presence of cardiac pacemaker: Secondary | ICD-10-CM | POA: Diagnosis not present

## 2021-08-19 DIAGNOSIS — I442 Atrioventricular block, complete: Secondary | ICD-10-CM | POA: Diagnosis not present

## 2021-08-19 DIAGNOSIS — I5020 Unspecified systolic (congestive) heart failure: Secondary | ICD-10-CM | POA: Diagnosis not present

## 2021-08-19 DIAGNOSIS — I471 Supraventricular tachycardia: Secondary | ICD-10-CM | POA: Diagnosis not present

## 2021-08-19 DIAGNOSIS — I428 Other cardiomyopathies: Secondary | ICD-10-CM | POA: Diagnosis not present

## 2021-08-19 DIAGNOSIS — I110 Hypertensive heart disease with heart failure: Secondary | ICD-10-CM | POA: Diagnosis not present

## 2021-08-19 DIAGNOSIS — Z7901 Long term (current) use of anticoagulants: Secondary | ICD-10-CM | POA: Diagnosis not present

## 2021-08-19 DIAGNOSIS — Z86718 Personal history of other venous thrombosis and embolism: Secondary | ICD-10-CM | POA: Diagnosis not present

## 2021-08-19 LAB — COMPREHENSIVE METABOLIC PANEL
ALT: 28 IU/L (ref 0–32)
AST: 31 IU/L (ref 0–40)
Albumin/Globulin Ratio: 1.8 (ref 1.2–2.2)
Albumin: 4.1 g/dL (ref 3.7–4.7)
Alkaline Phosphatase: 110 IU/L (ref 44–121)
BUN/Creatinine Ratio: 34 — ABNORMAL HIGH (ref 12–28)
BUN: 42 mg/dL — ABNORMAL HIGH (ref 8–27)
Bilirubin Total: 0.5 mg/dL (ref 0.0–1.2)
CO2: 23 mmol/L (ref 20–29)
Calcium: 9.5 mg/dL (ref 8.7–10.3)
Chloride: 103 mmol/L (ref 96–106)
Creatinine, Ser: 1.24 mg/dL — ABNORMAL HIGH (ref 0.57–1.00)
Globulin, Total: 2.3 g/dL (ref 1.5–4.5)
Glucose: 122 mg/dL — ABNORMAL HIGH (ref 70–99)
Potassium: 4.5 mmol/L (ref 3.5–5.2)
Sodium: 140 mmol/L (ref 134–144)
Total Protein: 6.4 g/dL (ref 6.0–8.5)
eGFR: 45 mL/min/{1.73_m2} — ABNORMAL LOW (ref >59)

## 2021-08-25 ENCOUNTER — Other Ambulatory Visit: Payer: Self-pay | Admitting: Endocrinology

## 2021-08-25 DIAGNOSIS — E119 Type 2 diabetes mellitus without complications: Secondary | ICD-10-CM

## 2021-09-10 ENCOUNTER — Encounter: Payer: Self-pay | Admitting: Family Medicine

## 2021-09-10 DIAGNOSIS — F332 Major depressive disorder, recurrent severe without psychotic features: Secondary | ICD-10-CM | POA: Insufficient documentation

## 2021-09-10 DIAGNOSIS — M791 Myalgia, unspecified site: Secondary | ICD-10-CM | POA: Insufficient documentation

## 2021-09-10 NOTE — Assessment & Plan Note (Signed)
Hold lovastatin, to see if any improvement in achiness.

## 2021-09-10 NOTE — Assessment & Plan Note (Signed)
Start venlafaxine 75 mg once daily in am. Stop vraylar. Refer to psychiatry.  Recommend counseling ([facility] or [facility].)

## 2021-09-16 DIAGNOSIS — Z950 Presence of cardiac pacemaker: Secondary | ICD-10-CM | POA: Diagnosis not present

## 2021-09-16 DIAGNOSIS — Z86718 Personal history of other venous thrombosis and embolism: Secondary | ICD-10-CM | POA: Diagnosis not present

## 2021-09-16 DIAGNOSIS — I110 Hypertensive heart disease with heart failure: Secondary | ICD-10-CM | POA: Diagnosis not present

## 2021-09-16 DIAGNOSIS — I442 Atrioventricular block, complete: Secondary | ICD-10-CM | POA: Diagnosis not present

## 2021-09-16 DIAGNOSIS — Z7901 Long term (current) use of anticoagulants: Secondary | ICD-10-CM | POA: Diagnosis not present

## 2021-09-16 DIAGNOSIS — I428 Other cardiomyopathies: Secondary | ICD-10-CM | POA: Diagnosis not present

## 2021-09-16 DIAGNOSIS — I5022 Chronic systolic (congestive) heart failure: Secondary | ICD-10-CM | POA: Diagnosis not present

## 2021-09-19 ENCOUNTER — Ambulatory Visit (INDEPENDENT_AMBULATORY_CARE_PROVIDER_SITE_OTHER): Payer: Medicare Other | Admitting: Family Medicine

## 2021-09-19 ENCOUNTER — Other Ambulatory Visit: Payer: Self-pay

## 2021-09-19 ENCOUNTER — Encounter: Payer: Self-pay | Admitting: Family Medicine

## 2021-09-19 ENCOUNTER — Telehealth: Payer: Self-pay

## 2021-09-19 VITALS — BP 110/68 | HR 82 | Temp 96.2°F | Resp 16 | Ht 62.0 in | Wt 104.4 lb

## 2021-09-19 DIAGNOSIS — M7502 Adhesive capsulitis of left shoulder: Secondary | ICD-10-CM | POA: Insufficient documentation

## 2021-09-19 DIAGNOSIS — M25511 Pain in right shoulder: Secondary | ICD-10-CM | POA: Diagnosis not present

## 2021-09-19 DIAGNOSIS — F330 Major depressive disorder, recurrent, mild: Secondary | ICD-10-CM

## 2021-09-19 DIAGNOSIS — Z1211 Encounter for screening for malignant neoplasm of colon: Secondary | ICD-10-CM | POA: Insufficient documentation

## 2021-09-19 MED ORDER — VENLAFAXINE HCL ER 75 MG PO CP24: 75.0000 mg | ORAL_CAPSULE | Freq: Every day | ORAL | 1 refills | Status: DC

## 2021-09-19 NOTE — Progress Notes (Signed)
Chronic Care Management Pharmacy Assistant   Name: Bridget [name]  MRN: [id number] DOB: [birth year]   Reason for Encounter: Disease State call for DM    Recent office visits:  09/19/21 [name] MD. Seen for Depression. No med changes.   08/18/21 [name] MD. Seen for Acute Renal Insufficiency. Started on Venlafaxine HCI 9m daily. Hold lovastatin.Referral to Psychiatry.   Recent consult visits:  09/16/21 (Cardiology) FMar DaringMD. Seen for HTN. No med changes.   08/19/21  (Cardiology) FMar DaringMD. Nonischemic Cardiomyopathy.  D/C Bupropion 1517m   Hospital visits:  None  Medications: Outpatient Encounter Medications as of 09/19/2021  Medication Sig   albuterol (VENTOLIN HFA) 108 (90 Base) MCG/ACT inhaler INHALE 1 TO 2 PUFFS BY MOUTH EVERY 4 HOURS AS NEEDED FOR WHEEZING   aspirin 81 MG chewable tablet Chew 81 mg by mouth in the morning.    Blood Glucose Monitoring Suppl (ONETOUCH VERIO FLEX SYSTEM) w/Device KIT Check sugar once daily   Budeson-Glycopyrrol-Formoterol (BREZTRI AEROSPHERE) 160-9-4.8 MCG/ACT AERO Inhale 2 puffs into the lungs 2 (two) times daily.   calcium-vitamin D (OSCAL WITH D) 500-200 MG-UNIT TABS tablet Take 1 tablet by mouth daily.   carvedilol (COREG) 12.5 MG tablet Take 12.5 mg by mouth 2 (two) times daily with a meal.   Cholecalciferol (VITAMIN D-3) 1000 UNITS CAPS Take 1,000 Units by mouth daily.   furosemide (LASIX) 20 MG tablet Take 1 tablet (20 mg total) by mouth daily. (Patient taking differently: Take 20 mg by mouth 2 (two) times daily.)   Glucagon, rDNA, (GLUCAGON EMERGENCY) 1 MG KIT Inject 1 kit as directed once for 1 dose. If sugars < 70, if you cannot increase with oral glucose.   losartan (COZAAR) 25 MG tablet Take 25 mg by mouth daily.   lovastatin (MEVACOR) 40 MG tablet TAKE 1 TABLET BY MOUTH AT BEDTIME   Magnesium 250 MG TABS Take 250 mg by mouth daily.   metFORMIN (GLUCOPHAGE) 500 MG tablet TAKE 1 TABLET BY  MOUTH TWICE DAILY WITH MORNING MEAL AND WITH EVENING MEAL   Multiple Vitamins-Minerals (CENTRUM SILVER ULTRA WOMENS PO) Take by mouth daily.   nitroGLYCERIN (NITROSTAT) 0.4 MG SL tablet DISSOLVE ONE TABLET UNDER THE TONGUE EVERY 5 MINUTES AS NEEDED FOR CHEST PAIN.  DO NOT EXCEED A TOTAL OF 3 DOSES IN 15 MINUTES   ondansetron (ZOFRAN) 4 MG tablet Take 1 tablet (4 mg total) by mouth every 8 (eight) hours as needed for nausea or vomiting.   OneTouch Delica Lancets 3325WISC USE 1  TO CHECK GLUCOSE ONCE DAILY *APPOINTMENT  REQUIRED  FOR  FUTURE  REFILLS   ONETOUCH VERIO test strip Use to check blood sugar once daily  DX CODE E11.9   polyethylene glycol (MIRALAX / GLYCOLAX) packet Take 17 g by mouth daily as needed.   Rivaroxaban (XARELTO) 15 MG TABS tablet Take 1 tablet (15 mg total) by mouth daily. (Patient taking differently: Take 15 mg by mouth. Take 1 tablet every other day)   venlafaxine XR (EFFEXOR XR) 75 MG 24 hr capsule Take 1 capsule (75 mg total) by mouth daily with breakfast.   No facility-administered encounter medications on file as of 09/19/2021.    Recent Relevant Labs: Lab Results  Component Value Date/Time   HGBA1C 6.5 (A) 07/26/2021 08:30 AM   HGBA1C 6.6 (A) 03/25/2021 08:29 AM   HGBA1C 6.7 (H) 10/22/2020 09:01 AM   HGBA1C 6.3 (H) 11/20/2019 09:31 AM   MICROALBUR 4.0 (H)  03/25/2021 08:59 AM   MICROALBUR <0.7 03/23/2020 10:49 AM    Kidney Function Lab Results  Component Value Date/Time   CREATININE 1.24 (H) 08/18/2021 03:31 PM   CREATININE 1.55 (H) 07/22/2021 10:05 AM   GFR 32.91 (L) 03/23/2020 08:52 AM   GFRNONAA 60 09/08/2020 02:42 PM   GFRAA 69 09/08/2020 02:42 PM     Current antihyperglycemic regimen:  Metformin 540m twice daily  Patient verbally confirms she is taking the above medications as directed. Yes  What recent interventions/DTPs have been made to improve glycemic control:  Pt denies any recent changes   Have there been any recent hospitalizations or  ED visits since last visit with CPP? No  Patient denies hypoglycemic symptoms  Patient reports hyperglycemic symptoms, including excessive thirst  How often are you checking your blood sugar? once daily  What are your blood sugars ranging? 96, 92,102 fasting   On insulin? No How many units:  During the week, how often does your blood glucose drop below 70? Never  Are you checking your feet daily/regularly? Yes  Adherence Review: Is the patient currently on a STATIN medication? Yes Is the patient currently on ACE/ARB medication? Yes Does the patient have >5 day gap between last estimated fill dates? CPP to review  Care Gaps: Last eye exam / Retinopathy Screening? 01/12/21 Last Annual Wellness Visit? 01/13/21 Last Diabetic Foot Exam? 10/22/20   Star Rating Drugs:  Medication:  Last Fill: Day Supply Metformin   08/26/21 90ds  [name] CSouth WallinsPharmacist Assistant  [phone]

## 2021-09-19 NOTE — Progress Notes (Signed)
Subjective:  Patient ID: Bridget [name], female    DOB: 06-07-1941  Age: 81 y.o. MRN: [id number]  Chief Complaint  Patient presents with   Depression   HPI Depression is improved on effexor xr 75 mg daily.  Doing great.  PHQ9 SCORE ONLY 09/19/2021 09/10/2021 09/10/2021  PHQ-9 Total Score 4 17 6     Patient has chronic left shoulder pain. Previously saw [facility] and would like to go back. Now right shoulder is bothering her as well, due to compensating for the right shoulder. Previously was told by orthopedics, she would need a colonoscopy prior to surgery.  Last colonoscopy was over 10 yrs ago. Was done by Dr. Lyndel Safe.   Current Outpatient Medications on File Prior to Visit  Medication Sig Dispense Refill   albuterol (VENTOLIN HFA) 108 (90 Base) MCG/ACT inhaler INHALE 1 TO 2 PUFFS BY MOUTH EVERY 4 HOURS AS NEEDED FOR WHEEZING 9 g 0   aspirin 81 MG chewable tablet Chew 81 mg by mouth in the morning.      Blood Glucose Monitoring Suppl (ONETOUCH VERIO FLEX SYSTEM) w/Device KIT Check sugar once daily 1 kit 0   Budeson-Glycopyrrol-Formoterol (BREZTRI AEROSPHERE) 160-9-4.8 MCG/ACT AERO Inhale 2 puffs into the lungs 2 (two) times daily. 32.1 g 3   calcium-vitamin D (OSCAL WITH D) 500-200 MG-UNIT TABS tablet Take 1 tablet by mouth daily.     carvedilol (COREG) 12.5 MG tablet Take 12.5 mg by mouth 2 (two) times daily with a meal.     Cholecalciferol (VITAMIN D-3) 1000 UNITS CAPS Take 1,000 Units by mouth daily.     furosemide (LASIX) 20 MG tablet Take 1 tablet (20 mg total) by mouth daily. (Patient taking differently: Take 20 mg by mouth 2 (two) times daily.) 30 tablet 0   Glucagon, rDNA, (GLUCAGON EMERGENCY) 1 MG KIT Inject 1 kit as directed once for 1 dose. If sugars < 70, if you cannot increase with oral glucose. 1 kit 1   losartan (COZAAR) 25 MG tablet Take 25 mg by mouth daily.     lovastatin (MEVACOR) 40 MG tablet TAKE 1 TABLET BY MOUTH AT BEDTIME 90 tablet 0   Magnesium 250  MG TABS Take 250 mg by mouth daily.     metFORMIN (GLUCOPHAGE) 500 MG tablet TAKE 1 TABLET BY MOUTH TWICE DAILY WITH MORNING MEAL AND WITH EVENING MEAL 180 tablet 0   Multiple Vitamins-Minerals (CENTRUM SILVER ULTRA WOMENS PO) Take by mouth daily.     nitroGLYCERIN (NITROSTAT) 0.4 MG SL tablet DISSOLVE ONE TABLET UNDER THE TONGUE EVERY 5 MINUTES AS NEEDED FOR CHEST PAIN.  DO NOT EXCEED A TOTAL OF 3 DOSES IN 15 MINUTES 25 tablet 0   ondansetron (ZOFRAN) 4 MG tablet Take 1 tablet (4 mg total) by mouth every 8 (eight) hours as needed for nausea or vomiting. 40 tablet 0   OneTouch Delica Lancets 40J MISC USE 1  TO CHECK GLUCOSE ONCE DAILY *APPOINTMENT  REQUIRED  FOR  FUTURE  REFILLS 100 each 3   ONETOUCH VERIO test strip Use to check blood sugar once daily  DX CODE E11.9 100 each 1   polyethylene glycol (MIRALAX / GLYCOLAX) packet Take 17 g by mouth daily as needed.     Rivaroxaban (XARELTO) 15 MG TABS tablet Take 1 tablet (15 mg total) by mouth daily. (Patient taking differently: Take 15 mg by mouth. Take 1 tablet every other day) 90 tablet 0   No current facility-administered medications on file prior to visit.  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[place])    Statin myopathy    Thyroid disease    Past Surgical History:  Procedure Laterality Date   ABDOMINAL HYSTERECTOMY  03/05/1998   pt does not think this was a total hysterectomy   APPENDECTOMY  12/31/1988   BREAST LUMPECTOMY Left 05/22/1996   pacemaker  09/27/2016   PACEMAKER REVISION  09/27/2020   PACEMAKER UPGRADE DUAL CHAMBER TO BIV    Family History   Problem Relation Age of Onset   High blood pressure Mother    Diabetes Mother        no medication needed   Kidney disease Mother    Pneumonia Mother    High blood pressure Father    Cancer Maternal Grandmother        Leukemia   Cancer Other        Ovarian   Social History   Socioeconomic History   Marital status: Married    Spouse name: Darryl   Number of children: 2   Years of education: Not on file   Highest education level: 9th grade  Occupational History   Occupation: Retired  Tobacco Use   Smoking status: Never   Smokeless tobacco: Never  Vaping Use   Vaping Use: Never used  Substance and Sexual Activity   Alcohol use: Never   Drug use: Never   Sexual activity: Not on file  Other Topics Concern   Not on file  Social History Narrative   Lives at home with her husband   Right handed   Caffeine: mostly tea, 0-2 cups a day   Social Determinants of Health   Financial Resource Strain: High Risk   Difficulty of Paying Living Expenses: Very hard  Food Insecurity: No Food Insecurity   Worried About Charity fundraiser in the Last Year: Never true   Arboriculturist in the Last Year: Never true  Transportation Needs: No Transportation Needs   Lack of Transportation (Medical): No   Lack of Transportation (Non-Medical): No  Physical Activity: Not on file  Stress: Not on file  Social Connections: Not on file    Review of Systems  Constitutional:  Positive for chills and diaphoresis. Negative for fatigue and fever.  HENT:  Positive for rhinorrhea. Negative for congestion, ear pain, sinus pressure, sinus pain and sore throat.   Respiratory:  Positive for shortness of breath (with running up and down the basement steps.). Negative for cough.   Cardiovascular:  Negative for chest pain.  Gastrointestinal:  Positive for abdominal pain and diarrhea. Negative for constipation, nausea and vomiting.  Genitourinary:  Negative for dysuria and urgency.  Musculoskeletal:   Positive for back pain. Negative for myalgias.  Neurological:  Positive for light-headedness. Negative for dizziness, weakness and headaches.  Psychiatric/Behavioral:  Negative for dysphoric mood. The patient is not nervous/anxious.     Objective:  BP 110/68    Pulse 82    Temp (!) 96.2 F (35.7 C)    Resp 16    Ht  5' 2"  (1.575 m)    Wt 104 lb 6.4 oz (47.4 kg)    SpO2 98%    BMI 19.10 kg/m   BP/Weight 09/19/2021 08/18/2021 58/52/7782  Systolic BP 423 536 144  Diastolic BP 68 60 58  Wt. (Lbs) 104.4 100 100.6  BMI 19.1 18.89 19.01    Physical Exam Vitals reviewed.  Constitutional:      Appearance: Normal appearance. She is normal weight.  Neck:     Vascular: No carotid bruit.  Cardiovascular:     Rate and Rhythm: Normal rate and regular rhythm.     Heart sounds: Normal heart sounds.  Pulmonary:     Effort: Pulmonary effort is normal. No respiratory distress.     Breath sounds: Normal breath sounds.  Abdominal:     General: Abdomen is flat. Bowel sounds are normal.     Palpations: Abdomen is soft.     Tenderness: There is no abdominal tenderness.  Neurological:     Mental Status: She is alert and oriented to person, place, and time.  Psychiatric:        Mood and Affect: Mood normal.        Behavior: Behavior normal.    Diabetic Foot Exam - Simple   No data filed      Lab Results  Component Value Date   WBC 8.8 07/22/2021   HGB 12.4 07/22/2021   HCT 37.4 07/22/2021   PLT 226 07/22/2021   GLUCOSE 122 (H) 08/18/2021   CHOL 144 06/15/2021   TRIG 95 06/15/2021   HDL 69 06/15/2021   LDLDIRECT 94.2 12/05/2013   LDLCALC 58 06/15/2021   ALT 28 08/18/2021   AST 31 08/18/2021   NA 140 08/18/2021   K 4.5 08/18/2021   CL 103 08/18/2021   CREATININE 1.24 (H) 08/18/2021   BUN 42 (H) 08/18/2021   CO2 23 08/18/2021   TSH 2.040 07/22/2021   INR 1.3 (H) 11/12/2020   HGBA1C 6.5 (A) 07/26/2021   MICROALBUR 4.0 (H) 03/25/2021      Assessment & Plan:   Problem List Items  Addressed This Visit       Musculoskeletal and Integument   Adhesive capsulitis of left shoulder - Primary    Refer to [facility].        Relevant Orders   AMB referral to orthopedics     Other   RESOLVED: Severe episode of recurrent major depressive disorder, without psychotic features (HCC)   Relevant Medications   venlafaxine XR (EFFEXOR XR) 75 MG 24 hr capsule   Acute pain of right shoulder    Refer to [facility].       Relevant Orders   AMB referral to orthopedics   Colon cancer screening    Refer to Dr. Lyndel Safe.       Relevant Orders   Ambulatory referral to Gastroenterology  .  Meds ordered this encounter  Medications   venlafaxine XR (EFFEXOR XR) 75 MG 24 hr capsule    Sig: Take 1 capsule (75 mg total) by mouth daily with breakfast.    Dispense:  90 capsule    Refill:  1    Orders Placed This Encounter  Procedures   AMB referral to orthopedics   Ambulatory referral to Gastroenterology     Follow-up: Return in about 3 months (around 12/17/2021) for chronic fasting.  An After Visit Summary was printed and given to the patient.  [name], MD [facility] [phone]

## 2021-09-19 NOTE — Assessment & Plan Note (Signed)
Refer to  orthopedics 

## 2021-09-19 NOTE — Assessment & Plan Note (Signed)
Refer to Dr. Gupta 

## 2021-09-19 NOTE — Assessment & Plan Note (Signed)
Continue effexor xr 75 mg once daily in am.

## 2021-09-19 NOTE — Assessment & Plan Note (Signed)
Refer to [facility] 

## 2021-09-28 ENCOUNTER — Encounter: Payer: Self-pay | Admitting: Gastroenterology

## 2021-10-07 NOTE — Progress Notes (Signed)
?[facility]  ?[address] ?[place],  [place]  [address] ?[phone] ? ?Clinic Day:  10/13/2021 ? ?Referring physician: [name], MD ? ?This document serves as a record of services personally performed by Marice Potter, MD. It was created on their behalf by Curry,Lauren E, a trained medical scribe. The creation of this record is based on the scribe's personal observations and the provider's statements to them. ? ?HISTORY OF PRESENT ILLNESS:  ?The patient is a 81 y.o. female with with anemia secondary to chronic renal insufficiency.  However, her hemoglobin has been well above 10 to where no red cell shot therapy has been needed in quite some time.  Furthermore, the patient's renal function has improved over these past few years.  She comes in today to reassess her anemia.  Since her last visit, the patient has been doing okay.  As it pertains to her anemia, she denies having increased fatigue or any overt forms of blood loss which concern her for a declining hemoglobin.  The major health issue she has been dealing with his congestive heart failure, for which he has been aggressively diuresed over these past months. ? ?PHYSICAL EXAM:  ?Blood pressure 124/60, pulse 84, temperature 98.2 ?F (36.8 ?C), resp. rate 14, height 5\' 2"  (1.575 m), weight 102 lb 11.2 oz (46.6 kg), SpO2 92 %. ?Wt Readings from Last 3 Encounters:  ?10/13/21 102 lb 11.2 oz (46.6 kg)  ?09/19/21 104 lb 6.4 oz (47.4 kg)  ?08/18/21 100 lb (45.4 kg)  ? ?Body mass index is 18.78 kg/m?Marland Kitchen ?Performance status (ECOG): 1 - Symptomatic but completely ambulatory ?Physical Exam ?Constitutional:   ?   Appearance: Normal appearance. She is not ill-appearing.  ?HENT:  ?   Mouth/Throat:  ?   Mouth: Mucous membranes are moist.  ?   Pharynx: Oropharynx is clear. No oropharyngeal exudate or posterior oropharyngeal erythema.  ?Cardiovascular:  ?   Rate and Rhythm: Normal rate and regular rhythm.  ?   Heart sounds: No murmur  heard. ?  No friction rub. No gallop.  ?Pulmonary:  ?   Effort: Pulmonary effort is normal. No respiratory distress.  ?   Breath sounds: Normal breath sounds. No wheezing, rhonchi or rales.  ?Abdominal:  ?   General: Bowel sounds are normal. There is no distension.  ?   Palpations: Abdomen is soft. There is no mass.  ?   Tenderness: There is no abdominal tenderness.  ?Musculoskeletal:     ?   General: No swelling.  ?   Right lower leg: No edema.  ?   Left lower leg: No edema.  ?Lymphadenopathy:  ?   Cervical: No cervical adenopathy.  ?   Upper Body:  ?   Right upper body: No supraclavicular or axillary adenopathy.  ?   Left upper body: No supraclavicular or axillary adenopathy.  ?   Lower Body: No right inguinal adenopathy. No left inguinal adenopathy.  ?Skin: ?   General: Skin is warm.  ?   Coloration: Skin is not jaundiced.  ?   Findings: No rash.  ?Neurological:  ?   General: No focal deficit present.  ?   Mental Status: She is alert and oriented to person, place, and time. Mental status is at baseline.  ?Psychiatric:     ?   Mood and Affect: Mood normal.     ?   Behavior: Behavior normal.     ?   Thought Content: Thought content normal.  ? ? ?  LABS:  ? ?CBC Latest Ref Rng & Units 10/13/2021 07/22/2021 06/15/2021  ?WBC - 9.6 8.8 7.2  ?Hemoglobin 12.0 - 16.0 12.7 12.4 12.1  ?Hematocrit 36 - 46 38 37.4 36.2  ?Platelets 150 - 399 214 226 184  ? ?CMP Latest Ref Rng & Units 10/13/2021 08/18/2021 07/22/2021  ?Glucose 70 - 99 mg/dL - 122(H) 128(H)  ?BUN 4 - 21 42(A) 42(H) 30(H)  ?Creatinine 0.5 - 1.1 1.3(A) 1.24(H) 1.55(H)  ?Sodium 137 - 147 135(A) 140 137  ?Potassium 3.4 - 5.3 4.5 4.5 3.9  ?Chloride 99 - 108 98(A) 103 96  ?CO2 13 - 22 28(A) 23 24  ?Calcium 8.7 - 10.7 9.3 9.5 9.3  ?Total Protein 6.0 - 8.5 g/dL - 6.4 6.4  ?Total Bilirubin 0.0 - 1.2 mg/dL - 0.5 0.6  ?Alkaline Phos 25 - 125 112 110 114  ?AST 13 - 35 33 31 25  ?ALT 7 - 35 23 28 18   ? ?ASSESSMENT & PLAN:  ?Assessment/Plan:  A 81 y.o. female with anemia secondary to  previous renal insufficiency.  Her hemoglobin remains ideal at 12.7 today.  Her kidney parameters today are mildly abnormal and are consistent with with prerenal azotemia likely related to her aggressive diuresis over these past months.  Her anemia will continue to be followed conservatively.  As she is clinically doing well, I will see this patient back in 1 year for repeat clinical assessment.   The patient understands all the plans discussed today and is in agreement with them.   ? ?I, Rita Ohara, am acting as scribe for Marice Potter, MD   ? ?I have reviewed this report as typed by the medical scribe, and it is complete and accurate. ? ?Timotheus Salm Macarthur Critchley, MD ? ? ? ?  ?

## 2021-10-10 DIAGNOSIS — Z45018 Encounter for adjustment and management of other part of cardiac pacemaker: Secondary | ICD-10-CM | POA: Diagnosis not present

## 2021-10-10 DIAGNOSIS — I428 Other cardiomyopathies: Secondary | ICD-10-CM | POA: Diagnosis not present

## 2021-10-13 ENCOUNTER — Encounter: Payer: Self-pay | Admitting: Oncology

## 2021-10-13 ENCOUNTER — Inpatient Hospital Stay: Payer: Medicare Other

## 2021-10-13 ENCOUNTER — Other Ambulatory Visit: Payer: Self-pay | Admitting: Oncology

## 2021-10-13 ENCOUNTER — Other Ambulatory Visit: Payer: Self-pay

## 2021-10-13 ENCOUNTER — Inpatient Hospital Stay: Payer: Medicare Other | Attending: Oncology | Admitting: Oncology

## 2021-10-13 VITALS — BP 124/60 | HR 84 | Temp 98.2°F | Resp 14 | Ht 62.0 in | Wt 102.7 lb

## 2021-10-13 DIAGNOSIS — D631 Anemia in chronic kidney disease: Secondary | ICD-10-CM

## 2021-10-13 DIAGNOSIS — N189 Chronic kidney disease, unspecified: Secondary | ICD-10-CM | POA: Diagnosis not present

## 2021-10-13 DIAGNOSIS — I509 Heart failure, unspecified: Secondary | ICD-10-CM | POA: Diagnosis not present

## 2021-10-13 DIAGNOSIS — D649 Anemia, unspecified: Secondary | ICD-10-CM | POA: Diagnosis not present

## 2021-10-13 LAB — BASIC METABOLIC PANEL
BUN: 42 — AB (ref 4–21)
CO2: 28 — AB (ref 13–22)
Chloride: 98 — AB (ref 99–108)
Creatinine: 1.3 — AB (ref 0.5–1.1)
Glucose: 121
Potassium: 4.5 (ref 3.4–5.3)
Sodium: 135 — AB (ref 137–147)

## 2021-10-13 LAB — HEPATIC FUNCTION PANEL
ALT: 23 (ref 7–35)
AST: 33 (ref 13–35)
Alkaline Phosphatase: 112 (ref 25–125)
Bilirubin, Total: 0.6

## 2021-10-13 LAB — FERRITIN: Ferritin: 40 ng/mL (ref 11–307)

## 2021-10-13 LAB — CBC AND DIFFERENTIAL
HCT: 38 (ref 36–46)
Hemoglobin: 12.7 (ref 12.0–16.0)
Neutrophils Absolute: 7.1
Platelets: 214 (ref 150–399)
WBC: 9.6

## 2021-10-13 LAB — CBC: RBC: 4.24 (ref 3.87–5.11)

## 2021-10-13 LAB — COMPREHENSIVE METABOLIC PANEL
Albumin: 3.9 (ref 3.5–5.0)
Calcium: 9.3 (ref 8.7–10.7)

## 2021-10-13 LAB — IRON AND TIBC
Iron: 68 ug/dL (ref 28–170)
Saturation Ratios: 15 % (ref 10.4–31.8)
TIBC: 456 ug/dL — ABNORMAL HIGH (ref 250–450)
UIBC: 388 ug/dL

## 2021-10-19 ENCOUNTER — Other Ambulatory Visit: Payer: Self-pay

## 2021-10-19 ENCOUNTER — Ambulatory Visit (INDEPENDENT_AMBULATORY_CARE_PROVIDER_SITE_OTHER): Payer: Medicare Other

## 2021-10-19 DIAGNOSIS — I10 Essential (primary) hypertension: Secondary | ICD-10-CM

## 2021-10-19 DIAGNOSIS — E1121 Type 2 diabetes mellitus with diabetic nephropathy: Secondary | ICD-10-CM

## 2021-10-19 DIAGNOSIS — F331 Major depressive disorder, recurrent, moderate: Secondary | ICD-10-CM

## 2021-10-19 DIAGNOSIS — F330 Major depressive disorder, recurrent, mild: Secondary | ICD-10-CM

## 2021-10-19 NOTE — Progress Notes (Signed)
Chronic Care Management Pharmacy Note    10/19/2021 Name:  Bridget [name] MRN:  [id number] DOB:  1941/03/12   Plan Recommendations:  Patient states this is the best she's felt in months  Subjective: Bridget [name] is an 81 y.o. year old female who is a primary patient of Cox, Kirsten, MD.  The CCM team was consulted for assistance with disease management and care coordination needs.    Engaged with patient by telephone for follow up visit in response to provider referral for pharmacy case management and/or care coordination services.   Consent to Services:  The patient was given information about Chronic Care Management services, agreed to services, and gave verbal consent prior to initiation of services.  Please see initial visit note for detailed documentation.   Patient Care Team: [name], MD as PCP - General (Family Medicine) Elayne Snare, MD as Consulting Physician (Endocrinology) Marice Potter, MD as Consulting Physician (Oncology) Flossie Buffy., MD as Referring Physician (Cardiology) Jackquline Denmark, MD as Consulting Physician (Gastroenterology) [facility] (Ophthalmology) Lane Hacker, [facility] (Pharmacist)  Recent office visits:  09/19/21 [name] MD. Seen for Depression. No med changes.    08/18/21 [name] MD. Seen for Acute Renal Insufficiency. Started on Venlafaxine HCI 60m daily. Hold lovastatin.Referral to Psychiatry.    Recent consult visits:  09/16/21 (Cardiology) FMar DaringMD. Seen for HTN. No med changes.    08/19/21  (Cardiology) FMar DaringMD. Nonischemic Cardiomyopathy.  D/C Bupropion 1533m    Hospital visits:  None   Objective:  Lab Results  Component Value Date   CREATININE 1.3 (A) 10/13/2021   BUN 42 (A) 10/13/2021   GFR 32.91 (L) 03/23/2020   GFRNONAA 60 09/08/2020   GFRAA 69 09/08/2020   NA 135 (A) 10/13/2021   K 4.5 10/13/2021   CALCIUM 9.3 10/13/2021   CO2 28 (A) 10/13/2021     Lab Results  Component Value Date/Time   HGBA1C 6.5 (A) 07/26/2021 08:30 AM   HGBA1C 6.6 (A) 03/25/2021 08:29 AM   HGBA1C 6.7 (H) 10/22/2020 09:01 AM   HGBA1C 6.3 (H) 11/20/2019 09:31 AM   GFR 32.91 (L) 03/23/2020 08:52 AM   GFR 54.56 (L) 01/02/2019 09:32 AM   MICROALBUR 4.0 (H) 03/25/2021 08:59 AM   MICROALBUR <0.7 03/23/2020 10:49 AM    Last diabetic Eye exam:  Lab Results  Component Value Date/Time   HMDIABEYEEXA No Retinopathy 01/12/2021 12:00 AM    Last diabetic Foot exam: No results found for: HMDIABFOOTEX   Lab Results  Component Value Date   CHOL 144 06/15/2021   HDL 69 06/15/2021   LDLCALC 58 06/15/2021   LDLDIRECT 94.2 12/05/2013   TRIG 95 06/15/2021   CHOLHDL 2.1 06/15/2021    Hepatic Function Latest Ref Rng & Units 10/13/2021 08/18/2021 07/22/2021  Total Protein 6.0 - 8.5 g/dL - 6.4 6.4  Albumin 3.5 - 5.0 3.9 4.1 4.0  AST 13 - 35 33 31 25  ALT 7 - 35 _0 Alk Phosphatase 25 - 125 112 110 114  Total Bilirubin 0.0 - 1.2 mg/dL - 0.5 0.6    Lab Results  Component Value Date/Time   TSH 2.040 07/22/2021 10:05 AM   TSH 1.64 03/23/2020 08:52 AM   FREET4 1.28 03/23/2020 08:52 AM   FREET4 1.02 01/02/2019 09:32 AM    CBC Latest Ref Rng & Units 10/13/2021 07/22/2021 06/15/2021  WBC - 9.6 8.8 7.2  Hemoglobin 12.0 - 16.0 12.7 12.4  12.1  Hematocrit 36 - 46 38 37.4 36.2  Platelets 150 - 399 214 226 184    No results found for: VD25OH  Clinical ASCVD: No  The 10-year ASCVD risk score (Arnett DK, et al., 2019) is: 46.7%   Values used to calculate the score:     Age: 32 years     Sex: Female     Is Non-Hispanic African American: No     Diabetic: Yes     Tobacco smoker: No     Systolic Blood Pressure: 497 mmHg     Is BP treated: Yes     HDL Cholesterol: 69 mg/dL     Total Cholesterol: 144 mg/dL    Depression screen [facility] 2/9 09/19/2021 09/10/2021 09/10/2021  Decreased Interest 0 1 3  Down, Depressed, Hopeless 0 2 3  PHQ - 2 Score 0 3 6  Altered sleeping 1 2  -  Tired, decreased energy 1 3 -  Change in appetite 0 3 -  Feeling bad or failure about yourself  0 2 -  Trouble concentrating 1 2 -  Moving slowly or fidgety/restless 1 2 -  Suicidal thoughts 0 0 -  PHQ-9 Score 4 17 -  Difficult doing work/chores - Very difficult -     Social History   Tobacco Use  Smoking Status Never  Smokeless Tobacco Never   BP Readings from Last 3 Encounters:  10/13/21 124/60  09/19/21 110/68  08/18/21 110/60   Pulse Readings from Last 3 Encounters:  10/13/21 84  09/19/21 82  08/18/21 88   Wt Readings from Last 3 Encounters:  10/13/21 102 lb 11.2 oz (46.6 kg)  09/19/21 104 lb 6.4 oz (47.4 kg)  08/18/21 100 lb (45.4 kg)    Assessment/Interventions: Review of patient past medical history, allergies, medications, health status, including review of consultants reports, laboratory and other test data, was performed as part of comprehensive evaluation and provision of chronic care management services.   SDOH:  (Social Determinants of Health) assessments and interventions performed: Yes SDOH Interventions    Flowsheet Row Most Recent Value  SDOH Interventions   Financial Strain Interventions Other (Comment)  [See CP (PAP)]  Transportation Interventions Intervention Not Indicated       CCM Care Plan  Allergies  Allergen Reactions   Atorvastatin Other (See Comments)    "muscle Cramps"   Budesonide Other (See Comments)    "fatigue"   Gabapentin Other (See Comments)    "fatigue"   Metoclopramide Other (See Comments)    "mayalgia"   Rosuvastatin Other (See Comments)    "muscle cramps"   Simvastatin Other (See Comments)    "muscle Cramps"   Wellbutrin [Bupropion]     Gi upset.    Sulfamethoxazole-Trimethoprim Rash    Medications Reviewed Today     Reviewed by Lane Hacker, [facility] (Pharmacist) on 10/19/21 at 0847  Med List Status: <None>   Medication Order Taking? Sig Documenting Provider Last Dose Status Informant  albuterol  (VENTOLIN HFA) 108 (90 Base) MCG/ACT inhaler [id number] No INHALE 1 TO 2 PUFFS BY MOUTH EVERY 4 HOURS AS NEEDED FOR WHEEZING Marge Duncans, PA-C Taking Active   aspirin 81 MG chewable tablet [id number] No Chew 81 mg by mouth in the morning.  [provider] Taking Active Self  Blood Glucose Monitoring Suppl (ONETOUCH VERIO FLEX SYSTEM) w/Device KIT [id number] No Check sugar once daily Elayne Snare, MD Taking Active   Budeson-Glycopyrrol-Formoterol (BREZTRI AEROSPHERE) 160-9-4.8 MCG/ACT AERO [id number] No Inhale 2 puffs into the  lungs 2 (two) times daily. Cox, Kirsten, MD Taking Active   calcium-vitamin D (OSCAL WITH D) 500-200 MG-UNIT TABS tablet [id number] No Take 1 tablet by mouth daily. [provider] Taking Active   carvedilol (COREG) 12.5 MG tablet [id number] No Take 12.5 mg by mouth 2 (two) times daily with a meal. [provider] Taking Active   Cholecalciferol (VITAMIN D-3) 1000 UNITS CAPS [id number] No Take 1,000 Units by mouth daily. [provider] Taking Active   furosemide (LASIX) 20 MG tablet [id number] No Take 1 tablet (20 mg total) by mouth daily.  Patient taking differently: Take 20 mg by mouth 2 (two) times daily.   Cox, Kirsten, MD Taking Active   losartan (COZAAR) 25 MG tablet [id number] No Take 25 mg by mouth daily. [provider] Taking Active   Magnesium 250 MG TABS [id number] No Take 250 mg by mouth daily. [provider] Taking Active   metFORMIN (GLUCOPHAGE) 500 MG tablet [id number]  TAKE 1 TABLET BY MOUTH TWICE DAILY WITH MORNING MEAL AND WITH EVENING MEAL Elayne Snare, MD  Active   Multiple Vitamins-Minerals (CENTRUM SILVER ULTRA WOMENS PO) [id number] No Take by mouth daily. [provider] Taking Active   nitroGLYCERIN (NITROSTAT) 0.4 MG SL tablet [id number] No DISSOLVE ONE TABLET UNDER THE TONGUE EVERY 5 MINUTES AS NEEDED FOR CHEST PAIN.  DO NOT EXCEED A TOTAL OF 3 DOSES IN 15 MINUTES Cox, Kirsten, MD Taking Active    ondansetron (ZOFRAN) 4 MG tablet [id number] No Take 1 tablet (4 mg total) by mouth every 8 (eight) hours as needed for nausea or vomiting. [name], MD Taking Active   OneTouch Delica Lancets 27X [place] [id number] No USE 1  TO CHECK GLUCOSE ONCE DAILY *APPOINTMENT  REQUIRED  FOR  FUTURE  REFILLS Elayne Snare, MD Taking Active   [facility] VERIO test strip [id number] No Use to check blood sugar once daily  DX CODE E11.9 Elayne Snare, MD Taking Active   Rivaroxaban (XARELTO) 15 MG TABS tablet [id number] No Take 1 tablet (15 mg total) by mouth daily.  Patient taking differently: Take 15 mg by mouth. Take 1 tablet every other day   Cox, Elnita Maxwell, MD Taking Expired 07/24/21 2359   venlafaxine XR (EFFEXOR XR) 75 MG 24 hr capsule [id number]  Take 1 capsule (75 mg total) by mouth daily with breakfast. [name], MD  Active             Patient Active Problem List   Diagnosis Date Noted   Adhesive capsulitis of left shoulder 09/19/2021   Acute pain of right shoulder 09/19/2021   Colon cancer screening 09/19/2021   Memory loss 07/25/2021   Other fatigue 07/25/2021   Acquired thrombophilia ([place]) 07/17/2021   Stage 3a chronic kidney disease ([place]) 06/15/2021   Heart and renal disease, hypertensive, stage 1-4 or unspecified chronic kidney disease, with heart failure ([place]) 06/15/2021   NICM (nonischemic cardiomyopathy) ([place]) 05/06/2021   Anemia of chronic renal failure 10/13/2020   Drug-induced myopathy 06/25/2020   Presence of cardiac pacemaker 06/25/2020   Major depressive disorder, recurrent episode, mild ([place]) 06/25/2020   Nonischemic dilated cardiomyopathy ([place]) 06/18/2020   Heart failure with reduced ejection fraction, NYHA class III ([place]) 05/29/2020   History of cardiac cath 03/19/2020   Diabetic glomerulopathy ([place]) 11/20/2019   Night sweats 11/20/2019   Adhesive capsulitis of right shoulder 11/20/2019   At moderate risk for fall 11/20/2019   TIA (transient ischemic attack) 06/03/2018    History of DVT (deep vein  thrombosis) 10/13/2016   Mixed hyperlipidemia 06/05/2013   Thyroid nodule 06/05/2013    Immunization History  Administered Date(s) Administered   Fluad Quad(high Dose 65+) 05/20/2020, 06/15/2021   Influenza, High Dose Seasonal PF 06/05/2017   Influenza,trivalent, recombinat, inj, PF 05/14/2012   Influenza-Unspecified 07/02/2014, 04/13/2015, 04/24/2016, 06/14/2018   PFIZER(Purple Top)SARS-COV-2 Vaccination 08/29/2019, 09/19/2019, 05/14/2020, 01/03/2021   Pneumococcal Conjugate-13 04/27/2014   Pneumococcal Polysaccharide-23 07/14/2010, 07/14/2010, 12/02/2015   Tdap 11/01/2017    Conditions to be addressed/monitored:  Hypertension, Hyperlipidemia, Diabetes, Heart Failure, Depression, Osteoporosis and anemia  Care Plan : [place]  Updates made by Lane Hacker, RPH since 10/19/2021 12:00 AM     Problem: dm, chf, hld   Priority: High  Onset Date: 10/25/2020     Long-Range Goal: Disease State Management   Start Date: 10/25/2020  Expected End Date: 10/25/2021  Recent Progress: On track  Priority: High  Note:   Current Barriers:  Unable to independently afford treatment regimen  Pharmacist Clinical Goal(s):  Over the next 90 days, patient will verbalize ability to afford treatment regimen through collaboration with PharmD and provider.   Interventions: 1:1 collaboration with [name], MD regarding development and update of comprehensive plan of care as evidenced by provider attestation and co-signature Inter-disciplinary care team collaboration (see longitudinal plan of care) Comprehensive medication review performed; medication list updated in electronic medical record  Hypertension (BP goal <130/80) BP Readings from Last 3 Encounters:  10/13/21 124/60  09/19/21 110/68  08/18/21 110/60  -Controlled -Current treatment: Carvedilol 12.5 mg  1 tablet mg bid Appropriate, Effective, Safe, Accessible Losartan 25 mg daily Appropriate,  Effective, Safe, Accessible -Medications previously tried:   -Current home readings: today is 105/59 mmHg. Had patient recheck and pharmacist calling back to determine updated blood pressure.  -Current dietary habits: eats healthy overall. Meat and vegetables mainly. Currently putting up fresh garden vegetables.  -Current exercise habits: walking some -Denies hypotensive/hypertensive symptoms -Educated on Daily salt intake goal < 2300 mg; Exercise goal of 150 minutes per week; Importance of home blood pressure monitoring; -Counseled to monitor BP at home daily, document, and provide log at future appointments -Counseled on diet and exercise extensively Recommended to continue current medication  Diabetes (A1c goal <7%) -Managed by Endo, Dr. Elayne Snare Lab Results  Component Value Date   HGBA1C 6.5 (A) 07/26/2021   HGBA1C 6.6 (A) 03/25/2021   HGBA1C 6.7 (H) 10/22/2020   Lab Results  Component Value Date   MICROALBUR 4.0 (H) 03/25/2021   LDLCALC 58 06/15/2021   CREATININE 1.3 (A) 10/13/2021   Lab Results  Component Value Date   NA 135 (A) 10/13/2021   K 4.5 10/13/2021   CREATININE 1.3 (A) 10/13/2021   EGFR 45 (L) 08/18/2021   GFRNONAA 60 09/08/2020   GLUCOSE 122 (H) 08/18/2021   Lab Results  Component Value Date   WBC 9.6 10/13/2021   HGB 12.7 10/13/2021   HCT 38 10/13/2021   MCV 90 07/22/2021   PLT 214 10/13/2021  -Controlled -Current medications: metformin 500 mg morning and 500 mg evening meals Appropriate, Effective, Safe, Accessible -Medications previously tried: pioglitazone  -Current home glucose readings fasting glucose:  October 2023: 87, 121, 164, 128, 144  March 2023: 101/112/110 post prandial glucose: not taking  -Denies hypoglycemic/hyperglycemic symptoms -Current meal patterns:  Patient reports healthy diet and mainly eats at home.  -Current exercise: walking  -Educated on A1c and blood sugar goals; Complications of diabetes including kidney  damage, retinal damage, and cardiovascular  disease; Exercise goal of 150 minutes per week; -Counseled to check feet daily and get yearly eye exams -Counseled on diet and exercise extensively Counseled on considering SGLT2 medication for blood sugar, chf and kidney benefits per endocrinology recommendation.  Plan: At goal,  patient stable/ symptoms controlled   Heart Failure (Goal: manage symptoms and prevent exacerbations) -Controlled -Last ejection fraction: 20-25% (Date: 07/2020) -HF type: Diastolic -NYHA Class: III (marked limitation of activity) -Current treatment: carvedilol 12.5 mg bid Appropriate, Effective, Safe, Accessible Furosemide 20 mg every other day unless swelling increases Appropriate, Effective, Safe, Accessible Losartan 25 mg daily Appropriate, Effective, Safe, Accessible Nitroglycerin 0.4 mg sl every 5 minutes prn chest pain Appropriate, Effective, Safe, Accessible -Medications previously tried: none reported -Current home BP/HR readings: well controlled currently -Current dietary habits: eating healthy diet. Meat and vegetables.  -Current exercise habits: walking more and working to increase stamina since pacemaker updated 09/27/2020.  -Educated on Benefits of medications for managing symptoms and prolonging life Importance of blood pressure control -Counseled on diet and exercise extensively Recommended to continue current medication  Hyperlipidemia: (LDL goal < 55) The 10-year ASCVD risk score (Arnett DK, et al., 2019) is: 46.7%   Values used to calculate the score:     Age: 12 years     Sex: Female     Is Non-Hispanic African American: No     Diabetic: Yes     Tobacco smoker: No     Systolic Blood Pressure: 676 mmHg     Is BP treated: Yes     HDL Cholesterol: 69 mg/dL     Total Cholesterol: 144 mg/dL Lab Results  Component Value Date   CHOL 144 06/15/2021   CHOL 178 02/21/2021   CHOL 220 (H) 10/22/2020   Lab Results  Component Value Date   HDL 69  06/15/2021   HDL 74 02/21/2021   HDL 78 10/22/2020   Lab Results  Component Value Date   LDLCALC 58 06/15/2021   LDLCALC 89 02/21/2021   LDLCALC 111 (H) 10/22/2020   Lab Results  Component Value Date   TRIG 95 06/15/2021   TRIG 85 02/21/2021   TRIG 182 (H) 10/22/2020   Lab Results  Component Value Date   CHOLHDL 2.1 06/15/2021   CHOLHDL 2.4 02/21/2021   CHOLHDL 2.8 10/22/2020   Lab Results  Component Value Date   LDLDIRECT 94.2 12/05/2013  -Uncontrolled -Current treatment: Aspirin 81 mg daily  Appropriate, Effective, Safe, Accessible -Medications previously tried: atorvastatin, rosuvastatin, simvastatin, fluvastatin, Lovastatin -Current dietary patterns: overall healthy diet.  -Current exercise habits: walking more and working to rebuild stamina.  -Educated on Cholesterol goals;  Benefits of statin for ASCVD risk reduction; Importance of limiting foods high in cholesterol; Exercise goal of 150 minutes per week; -Counseled on diet and exercise extensively Recommended to continue current medication  Anticoagulation -Controlled -Current treatment: Xarelto 80m QD Appropriate, Effective, Safe, Accessible October 2022: PAP due to be renewed 07/06/21, will complete  COPD No flowsheet data found. Pulmonary Functions Testing Results:  No results found for: FEV1, FVC, FEV1FVC, TLC, DLCO  -Controlled -Current treatment: Breztri Appropriate, Effective, Safe, Accessible October 2022: PAP due to be renewed 07/06/21, will complete   Depression/Anxiety -Controlled -Current treatment: Venlafaxine XR 725mQD Appropriate, Effective, Safe, Accessible -Medications previously tried/failed: Trintellix (Didn't help), Wellbutrin (N/V), and Vraylar (Cost) -PHQ9:  [facility] 09/19/2021 09/10/2021 09/10/2021  Decreased Interest 0 1 3  Down, Depressed, Hopeless 0 2 3  PHQ - 2 Score 0 3 6  Altered sleeping 1 2 -  Tired, decreased energy 1 3 -  Change in appetite 0 3  -  Feeling bad or failure about yourself  0 2 -  Trouble concentrating 1 2 -  Moving slowly or fidgety/restless 1 2 -  Suicidal thoughts 0 0 -  PHQ-9 Score 4 17 -  Difficult doing work/chores - Very difficult -  -GAD7:  GAD 7 : Generalized Anxiety Score 08/18/2021 06/30/2021  Nervous, Anxious, on Edge 3 1  Control/stop worrying 2 3  Worry too much - different things 2 1  Trouble relaxing 3 1  Restless 3 3  Easily annoyed or irritable 1 1  Afraid - awful might happen 2 0  Total GAD 7 Score 16 10  Anxiety Difficulty Somewhat difficult -  -Educated on Benefits of medication for symptom control -Recommended to continue current medication    Patient Goals/Self-Care Activities Over the next 90 days, patient will:  - take medications as prescribed focus on medication adherence by using pill box check glucose daily, document, and provide at future appointments check blood pressure daily, document, and provide at future appointments Contact provider or pharmacist if patient decides to trial [place].   Follow Up Plan: Telephone follow up appointment with care management team member scheduled for: September 2023       Medication Assistance:  Breztriobtained through [facility] and Me medication assistance program.  Enrollment ends 08/13/2021 Xarelto through PAP as well  Patient's preferred pharmacy is:  [facility] [address] [place] [address] [place] [address] Phone: [phone] Fax: [phone]   Uses pill box? Yes Pt endorses 100% compliance  We discussed: Benefits of medication synchronization, packaging and delivery as well as enhanced pharmacist oversight with [employer]. Patient decided to: Continue current medication management strategy  Care Plan and Follow Up Patient Decision:  Patient agrees to Care Plan and Follow-up.  Plan: Telephone follow up appointment with care management team member scheduled for:  Sept 2023  [place], [place].D. - [phone]

## 2021-10-19 NOTE — Patient Instructions (Signed)
Visit Information   Goals Addressed   None    Patient Care Plan: CCM Pharmacy Care Plan     Problem Identified: dm, chf, hld   Priority: High  Onset Date: 10/25/2020     Long-Range Goal: Disease State Management   Start Date: 10/25/2020  Expected End Date: 10/25/2021  Recent Progress: On track  Priority: High  Note:   Current Barriers:  Unable to independently afford treatment regimen  Pharmacist Clinical Goal(s):  Over the next 90 days, patient will verbalize ability to afford treatment regimen through collaboration with PharmD and provider.   Interventions: 1:1 collaboration with [name], MD regarding development and update of comprehensive plan of care as evidenced by provider attestation and co-signature Inter-disciplinary care team collaboration (see longitudinal plan of care) Comprehensive medication review performed; medication list updated in electronic medical record  Hypertension (BP goal <130/80) BP Readings from Last 3 Encounters:  10/13/21 124/60  09/19/21 110/68  08/18/21 110/60  -Controlled -Current treatment: Carvedilol 12.5 mg  1 tablet mg bid Appropriate, Effective, Safe, Accessible Losartan 25 mg daily Appropriate, Effective, Safe, Accessible -Medications previously tried:   -Current home readings: today is 105/59 mmHg. Had patient recheck and pharmacist calling back to determine updated blood pressure.  -Current dietary habits: eats healthy overall. Meat and vegetables mainly. Currently putting up fresh garden vegetables.  -Current exercise habits: walking some -Denies hypotensive/hypertensive symptoms -Educated on Daily salt intake goal < 2300 mg; Exercise goal of 150 minutes per week; Importance of home blood pressure monitoring; -Counseled to monitor BP at home daily, document, and provide log at future appointments -Counseled on diet and exercise extensively Recommended to continue current medication  Diabetes (A1c goal <7%) -Managed by  Endo, Dr. Elayne Snare Lab Results  Component Value Date   HGBA1C 6.5 (A) 07/26/2021   HGBA1C 6.6 (A) 03/25/2021   HGBA1C 6.7 (H) 10/22/2020   Lab Results  Component Value Date   MICROALBUR 4.0 (H) 03/25/2021   LDLCALC 58 06/15/2021   CREATININE 1.3 (A) 10/13/2021   Lab Results  Component Value Date   NA 135 (A) 10/13/2021   K 4.5 10/13/2021   CREATININE 1.3 (A) 10/13/2021   EGFR 45 (L) 08/18/2021   GFRNONAA 60 09/08/2020   GLUCOSE 122 (H) 08/18/2021   Lab Results  Component Value Date   WBC 9.6 10/13/2021   HGB 12.7 10/13/2021   HCT 38 10/13/2021   MCV 90 07/22/2021   PLT 214 10/13/2021  -Controlled -Current medications: metformin 500 mg morning and 500 mg evening meals Appropriate, Effective, Safe, Accessible -Medications previously tried: pioglitazone  -Current home glucose readings fasting glucose:  October 2023: 87, 121, 164, 128, 144  March 2023: 101/112/110 post prandial glucose: not taking  -Denies hypoglycemic/hyperglycemic symptoms -Current meal patterns:  Patient reports healthy diet and mainly eats at home.  -Current exercise: walking  -Educated on A1c and blood sugar goals; Complications of diabetes including kidney damage, retinal damage, and cardiovascular disease; Exercise goal of 150 minutes per week; -Counseled to check feet daily and get yearly eye exams -Counseled on diet and exercise extensively Counseled on considering SGLT2 medication for blood sugar, chf and kidney benefits per endocrinology recommendation.  Plan: At goal,  patient stable/ symptoms controlled   Heart Failure (Goal: manage symptoms and prevent exacerbations) -Controlled -Last ejection fraction: 20-25% (Date: 07/2020) -HF type: Diastolic -NYHA Class: III (marked limitation of activity) -Current treatment: carvedilol 12.5 mg bid Appropriate, Effective, Safe, Accessible Furosemide 20 mg every other day unless swelling increases  Appropriate, Effective, Safe,  Accessible Losartan 25 mg daily Appropriate, Effective, Safe, Accessible Nitroglycerin 0.4 mg sl every 5 minutes prn chest pain Appropriate, Effective, Safe, Accessible -Medications previously tried: none reported -Current home BP/HR readings: well controlled currently -Current dietary habits: eating healthy diet. Meat and vegetables.  -Current exercise habits: walking more and working to increase stamina since pacemaker updated 09/27/2020.  -Educated on Benefits of medications for managing symptoms and prolonging life Importance of blood pressure control -Counseled on diet and exercise extensively Recommended to continue current medication  Hyperlipidemia: (LDL goal < 55) The 10-year ASCVD risk score (Arnett DK, et al., 2019) is: 46.7%   Values used to calculate the score:     Age: 81 years     Sex: Female     Is Non-Hispanic African American: No     Diabetic: Yes     Tobacco smoker: No     Systolic Blood Pressure: 875 mmHg     Is BP treated: Yes     HDL Cholesterol: 69 mg/dL     Total Cholesterol: 144 mg/dL Lab Results  Component Value Date   CHOL 144 06/15/2021   CHOL 178 02/21/2021   CHOL 220 (H) 10/22/2020   Lab Results  Component Value Date   HDL 69 06/15/2021   HDL 74 02/21/2021   HDL 78 10/22/2020   Lab Results  Component Value Date   LDLCALC 58 06/15/2021   LDLCALC 89 02/21/2021   LDLCALC 111 (H) 10/22/2020   Lab Results  Component Value Date   TRIG 95 06/15/2021   TRIG 85 02/21/2021   TRIG 182 (H) 10/22/2020   Lab Results  Component Value Date   CHOLHDL 2.1 06/15/2021   CHOLHDL 2.4 02/21/2021   CHOLHDL 2.8 10/22/2020   Lab Results  Component Value Date   LDLDIRECT 94.2 12/05/2013  -Uncontrolled -Current treatment: Aspirin 81 mg daily  Appropriate, Effective, Safe, Accessible -Medications previously tried: atorvastatin, rosuvastatin, simvastatin, fluvastatin, Lovastatin -Current dietary patterns: overall healthy diet.  -Current exercise habits:  walking more and working to rebuild stamina.  -Educated on Cholesterol goals;  Benefits of statin for ASCVD risk reduction; Importance of limiting foods high in cholesterol; Exercise goal of 150 minutes per week; -Counseled on diet and exercise extensively Recommended to continue current medication  Anticoagulation -Controlled -Current treatment: Xarelto 36m QD Appropriate, Effective, Safe, Accessible October 2022: PAP due to be renewed 07/06/21, will complete  COPD No flowsheet data found. Pulmonary Functions Testing Results:  No results found for: FEV1, FVC, FEV1FVC, TLC, DLCO  -Controlled -Current treatment: Breztri Appropriate, Effective, Safe, Accessible October 2022: PAP due to be renewed 07/06/21, will complete    Patient Goals/Self-Care Activities Over the next 90 days, patient will:  - take medications as prescribed focus on medication adherence by using pill box check glucose daily, document, and provide at future appointments check blood pressure daily, document, and provide at future appointments Contact provider or pharmacist if patient decides to trial FIran   Follow Up Plan: Telephone follow up appointment with care management team member scheduled for: September 2023     Ms. JLarmonwas given information about Chronic Care Management services today including:  CCM service includes personalized support from designated clinical staff supervised by her physician, including individualized plan of care and coordination with other care providers 24/7 contact phone numbers for assistance for urgent and routine care needs. Standard insurance, coinsurance, copays and deductibles apply for chronic care management only during months in which we provide at least 265  minutes of these services. Most insurances cover these services at 100%, however patients may be responsible for any copay, coinsurance and/or deductible if applicable. This service may help you avoid the  need for more expensive face-to-face services. Only one practitioner may furnish and bill the service in a calendar month. The patient may stop CCM services at any time (effective at the end of the month) by phone call to the office staff.  Patient agreed to services and verbal consent obtained.   The patient verbalized understanding of instructions, educational materials, and care plan provided today and declined offer to receive copy of patient instructions, educational materials, and care plan.  The pharmacy team will reach out to the patient again over the next 60 days.   Lane Hacker, [place]

## 2021-10-24 ENCOUNTER — Encounter: Payer: Self-pay | Admitting: Gastroenterology

## 2021-10-24 ENCOUNTER — Ambulatory Visit (INDEPENDENT_AMBULATORY_CARE_PROVIDER_SITE_OTHER): Payer: Medicare Other | Admitting: Gastroenterology

## 2021-10-24 ENCOUNTER — Other Ambulatory Visit: Payer: Self-pay

## 2021-10-24 VITALS — BP 124/78 | HR 79 | Ht 59.0 in | Wt 104.0 lb

## 2021-10-24 DIAGNOSIS — R1319 Other dysphagia: Secondary | ICD-10-CM | POA: Diagnosis not present

## 2021-10-24 DIAGNOSIS — K625 Hemorrhage of anus and rectum: Secondary | ICD-10-CM | POA: Diagnosis not present

## 2021-10-24 DIAGNOSIS — K581 Irritable bowel syndrome with constipation: Secondary | ICD-10-CM | POA: Diagnosis not present

## 2021-10-24 DIAGNOSIS — I130 Hypertensive heart and chronic kidney disease with heart failure and stage 1 through stage 4 chronic kidney disease, or unspecified chronic kidney disease: Secondary | ICD-10-CM

## 2021-10-24 MED ORDER — OMEPRAZOLE 20 MG PO CPDR: 20.0000 mg | DELAYED_RELEASE_CAPSULE | Freq: Every day | ORAL | 3 refills | Status: DC

## 2021-10-24 NOTE — Progress Notes (Signed)
Chief Complaint: GI problems  Referring Provider:  [name], MD      ASSESSMENT AND PLAN;   #1. IBS-C with occ rectal bleed. Neg colon 04/2016  #2. Eso dysphagia  Plan: -Ba swallow with tab -Omeprazole 86m po qd #30 -Miralax 17g po QD -Continue Preparation H for now.  If still with problems, will give her hydrocortisone 2.5% cream. -FU in 12 weeks.  Endo procedures only if absolutely necessary d/t multiple comorbidities (after holding Xarelto and getting cardiac clearance prior)   HPI:    Bridget Hackenbergis a 81y.o. female  C/O A Fib On Xeralto, complete heart block s/p pacemaker, DM2, CKD, HTN,   C/O longstanding constipation with abdominal bloating, previously diagnosed as IBS-C.  Bms 1-2/week Blood when she strains especially with the stool is hard.  Mostly bright red in color, away from the stool.  Better with Preparation H. Hb 12, 3 months ago No abdominal pain   Also having intermittent solid food dysphagia-with food getting hung up in the lower neck area.  Has associated heartburn.  Better with omeprazole 20 mg p.o. once a day.  Was quite concerned about the symptoms.  No odynophagia.  Has been undergoing extensive cardiac work-up.  That has been stressful.  Has lost approximately 10 pounds over last 1 year.  No fever chills or night sweats.  Previous GI work-up: Colonoscopy 04/20/2016 ([facility]): Mild sigmoid diverticulosis, small internal hemorrhoids.  Otherwise normal colonoscopy.  No need to repeat unless problems.  Colonoscopy 02/2010: Small colonic polyp s/p polypectomy, mild sigmoid diverticulosis, internal hemorrhoids. Bx-hyperplastic.  EGD by Dr. FClaudie LeachApril 2007:  -Medium sized hiatal hernia -Gastric polyp. Bx-fundic gland polyp Past Medical History:  Diagnosis Date   Anemia    Anxiety and depression    Asthma    Atrioventricular block    CAD (coronary artery disease)    Chronic renal insufficiency    Diabetes mellitus without complication  [place]    Family history of colon cancer    Fibromyalgia    GERD (gastroesophageal reflux disease)    Gout    Heart disease    History of colon polyps    Hypertension    Insomnia    Kidney disease    Kidney stones 01/12/1983   Major depression    Major depressive disorder, recurrent, mild ([place]    Migraines    Mixed hyperlipidemia    Osteoporosis 01/27/2019   Other amnesia    Rosacea    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 Otherwise normal colonoscopy   ESOPHAGOGASTRODUODENOSCOPY  11/16/2005   [place] Patchy areas of mucosal erythema in the antrum compatible with gastritis. Polyps in the stomach. Nodule in the antrum. Medium hiatal hernia   pacemaker  09/27/2016   PACEMAKER REVISION  09/27/2020   PACEMAKER UPGRADE DUAL CHAMBER TO BIV    Family History  Problem Relation Age of Onset   High blood pressure Mother    Diabetes Mother        no medication needed   Kidney disease Mother    Pneumonia Mother    High  blood pressure Father    Colon cancer Father    Cancer Maternal Grandmother        Leukemia   Cancer Other        Ovarian   Rectal cancer Neg Hx    Stomach cancer Neg Hx    Esophageal cancer Neg Hx    Liver cancer Neg Hx    Pancreatic cancer Neg Hx     Social History   Tobacco Use   Smoking status: Never   Smokeless tobacco: Never  Vaping Use   Vaping Use: Never used  Substance Use Topics   Alcohol use: Never   Drug use: Never    Current Outpatient Medications  Medication Sig Dispense Refill   albuterol (VENTOLIN HFA) 108 (90 Base) MCG/ACT inhaler INHALE 1 TO 2 PUFFS BY MOUTH EVERY 4 HOURS AS  NEEDED FOR WHEEZING 9 g 0   aspirin 81 MG chewable tablet Chew 81 mg by mouth in the morning.      Blood Glucose Monitoring Suppl (ONETOUCH VERIO FLEX SYSTEM) w/Device KIT Check sugar once daily 1 kit 0   Budeson-Glycopyrrol-Formoterol (BREZTRI AEROSPHERE) 160-9-4.8 MCG/ACT AERO Inhale 2 puffs into the lungs 2 (two) times daily. 32.1 g 3   calcium-vitamin D (OSCAL WITH D) 500-200 MG-UNIT TABS tablet Take 1 tablet by mouth daily.     carvedilol (COREG) 12.5 MG tablet Take 12.5 mg by mouth 2 (two) times daily with a meal.     Cholecalciferol (VITAMIN D-3) 1000 UNITS CAPS Take 1,000 Units by mouth daily.     furosemide (LASIX) 20 MG tablet Take 1 tablet (20 mg total) by mouth daily. (Patient taking differently: Take 20 mg by mouth 2 (two) times daily.) 30 tablet 0   losartan (COZAAR) 25 MG tablet Take 25 mg by mouth daily.     Magnesium 250 MG TABS Take 250 mg by mouth daily.     metFORMIN (GLUCOPHAGE) 500 MG tablet TAKE 1 TABLET BY MOUTH TWICE DAILY WITH MORNING MEAL AND WITH EVENING MEAL 180 tablet 0   Multiple Vitamins-Minerals (CENTRUM SILVER ULTRA WOMENS PO) Take by mouth daily.     nitroGLYCERIN (NITROSTAT) 0.4 MG SL tablet DISSOLVE ONE TABLET UNDER THE TONGUE EVERY 5 MINUTES AS NEEDED FOR CHEST PAIN.  DO NOT EXCEED A TOTAL OF 3 DOSES IN 15 MINUTES 25 tablet 0   ondansetron (ZOFRAN) 4 MG tablet Take 1 tablet (4 mg total) by mouth every 8 (eight) hours as needed for nausea or vomiting. 40 tablet 0   OneTouch Delica Lancets 43P MISC USE 1  TO CHECK GLUCOSE ONCE DAILY *APPOINTMENT  REQUIRED  FOR  FUTURE  REFILLS 100 each 3   ONETOUCH VERIO test strip Use to check blood sugar once daily  DX CODE E11.9 100 each 1   venlafaxine XR (EFFEXOR XR) 75 MG 24 hr capsule Take 1 capsule (75 mg total) by mouth daily with breakfast. 90 capsule 1   Rivaroxaban (XARELTO) 15 MG TABS tablet Take 1 tablet (15 mg total) by mouth daily. (Patient taking differently: Take 15 mg by mouth. Take 1 tablet every other day) 90  tablet 0   No current facility-administered medications for this visit.    Allergies  Allergen Reactions   Atorvastatin Other (See Comments)    "muscle Cramps"   Budesonide Other (See Comments)    "fatigue"   Gabapentin Other (See Comments)    "fatigue"   Metoclopramide Other (See Comments)    "mayalgia"   Rosuvastatin Other (See Comments)    "  muscle cramps"   Simvastatin Other (See Comments)    "muscle Cramps"   Wellbutrin [Bupropion]     Gi upset.    Sulfamethoxazole-Trimethoprim Rash    Review of Systems:  Constitutional: Denies fever, chills, diaphoresis, appetite change and fatigue.  HEENT: Denies photophobia, eye pain, redness, hearing loss, ear pain, congestion, sore throat, rhinorrhea, sneezing, mouth sores, neck pain, neck stiffness and tinnitus.   Respiratory: Denies SOB, DOE, cough, chest tightness,  and wheezing.   Cardiovascular: Denies chest pain, palpitations and leg swelling.  Genitourinary: Denies dysuria, urgency, frequency, hematuria, flank pain and difficulty urinating.  Musculoskeletal: Has myalgias, back pain, joint swelling, arthralgias and gait problem.  Skin: No rash.  Neurological: Denies dizziness, seizures, syncope, weakness, light-headedness, numbness and headaches.  Hematological: Denies adenopathy. Easy bruising, personal or family bleeding history  Psychiatric/Behavioral: Has anxiety or depression     Physical Exam:    BP 124/78    Pulse 79    Ht 4' 11"  (1.499 m)    Wt 104 lb (47.2 kg)    SpO2 94%    BMI 21.01 kg/m  Wt Readings from Last 3 Encounters:  10/24/21 104 lb (47.2 kg)  10/13/21 102 lb 11.2 oz (46.6 kg)  09/19/21 104 lb 6.4 oz (47.4 kg)   Constitutional:  Well-developed, in no acute distress. Psychiatric: Normal mood and affect. Behavior is normal. HEENT: Conjunctivae are normal. No scleral icterus. Cardiovascular: Normal rate, regular rhythm. No edema Pulmonary/chest: Effort normal and breath sounds normal. No wheezing,  rales or rhonchi. Abdominal: Soft, nondistended. Nontender. Bowel sounds active throughout. There are no masses palpable. No hepatomegaly. Rectal: Deferred.  Wants to hold off currently Neurological: Alert and oriented to person place and time. Skin: Skin is warm and dry. No rashes noted.  Data Reviewed: I have personally reviewed following labs and imaging studies  CBC: CBC Latest Ref Rng & Units 10/13/2021 07/22/2021 06/15/2021  WBC - 9.6 8.8 7.2  Hemoglobin 12.0 - 16.0 12.7 12.4 12.1  Hematocrit 36 - 46 38 37.4 36.2  Platelets 150 - 399 214 226 184    CMP: CMP Latest Ref Rng & Units 10/13/2021 08/18/2021 07/22/2021  Glucose 70 - 99 mg/dL - 122(H) 128(H)  BUN 4 - 21 42(A) 42(H) 30(H)  Creatinine 0.5 - 1.1 1.3(A) 1.24(H) 1.55(H)  Sodium 137 - 147 135(A) 140 137  Potassium 3.4 - 5.3 4.5 4.5 3.9  Chloride 99 - 108 98(A) 103 96  CO2 13 - 22 28(A) 23 24  Calcium 8.7 - 10.7 9.3 9.5 9.3  Total Protein 6.0 - 8.5 g/dL - 6.4 6.4  Total Bilirubin 0.0 - 1.2 mg/dL - 0.5 0.6  Alkaline Phos 25 - 125 112 110 114  AST 13 - 35 33 31 25  ALT 7 - 35 23 28 18       Carmell Austria, MD 10/24/2021, 9:27 AM  Cc: [name], MD

## 2021-10-24 NOTE — Patient Instructions (Addendum)
If you are age 79 or older, your body mass index should be between 23-30. Your Body mass index is 21.01 kg/m?Marland Kitchen If this is out of the aforementioned range listed, please consider follow up with your Primary Care Provider. ? ?If you are age 65 or younger, your body mass index should be between 19-25. Your Body mass index is 21.01 kg/m?Marland Kitchen If this is out of the aformentioned range listed, please consider follow up with your Primary Care Provider.  ? ?________________________________________________________ ? ?The [facility] providers would like to encourage you to use [facility] to communicate with providers for non-urgent requests or questions.  Due to long hold times on the telephone, sending your provider a message by [facility] may be a faster and more efficient way to get a response.  Please allow 48 business hours for a response.  Please remember that this is for non-urgent requests.  ?_______________________________________________________ ? ?We have sent the following medications to your pharmacy for you to pick up at your convenience: ?Omeprazole 20mg  daily ? ?Please purchase the following medications over the counter and take as directed: ?Miralax 17g daily in 8oz water ? ?Please call to schedule a follow up appointment in 3 months ? ?You have been scheduled for a Barium Esophogram at [facility] (1st floor of the hospital) on 11-03-2021 at 1030am. Please arrive 15 minutes prior to your appointment for registration. Make certain not to have anything to eat or drink 3 hours prior to your test. If you need to reschedule for any reason, please contact radiology at [phone] to do so. ?__________________________________________________________________ ?A barium swallow is an examination that concentrates on views of the esophagus. This tends to be a double contrast exam (barium and two liquids which, when combined, create a gas to distend the wall of the oesophagus) or single contrast (non-ionic iodine  based). The study is usually tailored to your symptoms so a good history is essential. Attention is paid during the study to the form, structure and configuration of the esophagus, looking for functional disorders (such as aspiration, dysphagia, achalasia, motility and reflux) ?EXAMINATION ?You may be asked to change into a gown, depending on the type of swallow being performed. A radiologist and radiographer will perform the procedure. The radiologist will advise you of the ?type of contrast selected for your procedure and direct you during the exam. You will be asked to stand, sit or lie in several different positions and to hold a small amount of fluid in your mouth before being asked to swallow while the imaging is performed .In some instances you may be asked to swallow barium coated marshmallows to assess the motility of a solid food bolus. ?The exam can be recorded as a digital or video fluoroscopy procedure. ?POST PROCEDURE ?It will take 1-2 days for the barium to pass through your system. To facilitate this, it is important, unless otherwise directed, to increase your fluids for the next 24-48hrs and to resume your normal diet.  ?This test typically takes about 30 minutes to perform. ?__________________________________________________________________________________ ? ?

## 2021-10-25 ENCOUNTER — Ambulatory Visit: Payer: Medicare Other | Admitting: Endocrinology

## 2021-10-26 ENCOUNTER — Encounter: Payer: Self-pay | Admitting: Endocrinology

## 2021-10-26 ENCOUNTER — Other Ambulatory Visit: Payer: Self-pay

## 2021-10-26 ENCOUNTER — Ambulatory Visit (INDEPENDENT_AMBULATORY_CARE_PROVIDER_SITE_OTHER): Payer: Medicare Other | Admitting: Endocrinology

## 2021-10-26 VITALS — BP 126/66 | HR 81 | Ht 59.0 in | Wt 102.8 lb

## 2021-10-26 DIAGNOSIS — N289 Disorder of kidney and ureter, unspecified: Secondary | ICD-10-CM

## 2021-10-26 DIAGNOSIS — E041 Nontoxic single thyroid nodule: Secondary | ICD-10-CM

## 2021-10-26 DIAGNOSIS — E119 Type 2 diabetes mellitus without complications: Secondary | ICD-10-CM

## 2021-10-26 LAB — POCT GLYCOSYLATED HEMOGLOBIN (HGB A1C): Hemoglobin A1C: 6.6 % — AB (ref 4.0–5.6)

## 2021-10-26 MED ORDER — ONETOUCH DELICA LANCETS 33G MISC: 3 refills | Status: DC

## 2021-10-26 MED ORDER — ONETOUCH VERIO VI STRP: ORAL_STRIP | 1 refills | Status: DC

## 2021-10-26 NOTE — Patient Instructions (Signed)
Check blood sugars on waking up 3 days a week ? ?Also check blood sugars about 2 hours after meals and do this after different meals by rotation ? ?Recommended blood sugar levels on waking up are 90-130 and about 2 hours after meal is 130-180 ? ? ?

## 2021-10-26 NOTE — Progress Notes (Signed)
? ? ?Patient ID: Bridget [name], female   DOB: 1940/09/24, 81 y.o.   MRN: [id number] ? ? ?Reason for Appointment: follow-up  ? ?History of Present Illness  ? ?Diagnosis: Type 2 DIABETES MELITUS, date of diagnosis:  2007    ? ?Previous history: Her A1c at diagnosis was 7.9% ?She has been treated with a combination of metformin ER and Actos for several years ?Usually her A1c has been upper normal and her last A1c was 5.8 in 4/14 ? ?Recent history: ? ?Her A1c is about the same at  6.6  ? ?Oral hypoglycemic drugs:   metformin  500 mg, 1 tablet a.m. and 1 tablets in the evening ? ?She has been eating better with improved appetite and has gained back some more weight recently ?Vania Rea was supposed to be started previously but she has not heard from the cardiologist about this ?Also because of renal dysfunction she was told to cut back on metformin to only 1 tab a day and with improved renal functions he is back on the twice a day ?She mostly checks blood sugars in the mornings and these are averaging about 125 but as low as 101 ?HIGHEST blood sugar 155 ?She is now able to do a little more walking as she is feeling better physically ?She is still frequently eating cereal or oatmeal for breakfast and does not check readings after meals especially in the evening ? ?Generally she is reluctant to take any brand-name medication because of cost ?    ?       ? Side effects from medications:  Abdominal discomfort from high-dose metformin ?      ?Monitors blood glucose:  once a day or less.    Glucometer: One Touch Ultra.   ?        ?Blood Glucose readings from monitor download:  ? ? ?PRE-MEAL Fasting Lunch Dinner Bedtime Overall  ?Glucose range: 113-155 81, 103     ?Mean/median: 125    119  ? ?POST-MEAL PC Breakfast PC Lunch PC Dinner  ?Glucose range:   ?  ?Mean/median:     ? ?Previously: ? ?PRE-MEAL Fasting Lunch Dinner Bedtime Overall  ?Glucose range: 106-172  111-130  106-172  ?Mean/median: 129    129   ? ? ? ?Meals: 3 meals per day. Usually follows a healthy diet, portions controlled ? ?           ?Dietician visit: Most recent: 2007           ? ?Wt Readings from Last 3 Encounters:  ?10/26/21 102 lb 12.8 oz (46.6 kg)  ?10/24/21 104 lb (47.2 kg)  ?10/13/21 102 lb 11.2 oz (46.6 kg)  ? ?Lab Results  ?Component Value Date  ? HGBA1C 6.6 (A) 10/26/2021  ? HGBA1C 6.5 (A) 07/26/2021  ? HGBA1C 6.6 (A) 03/25/2021  ? ?Lab Results  ?Component Value Date  ? MICROALBUR 4.0 (H) 03/25/2021  ? Gray 58 06/15/2021  ? CREATININE 1.3 (A) 10/13/2021  ? ?Lab Results  ?Component Value Date  ? CREATININE 1.3 (A) 10/13/2021  ? CREATININE 1.24 (H) 08/18/2021  ? CREATININE 1.55 (H) 07/22/2021  ? ? ?Other active problems discussed: See review of systems ? ? ?Allergies as of 10/26/2021   ? ?   Reactions  ? Atorvastatin Other (See Comments)  ? "muscle Cramps"  ? Budesonide Other (See Comments)  ? "fatigue"  ? Gabapentin Other (See Comments)  ? "fatigue"  ? Metoclopramide Other (See Comments)  ? "mayalgia"  ? Rosuvastatin  Other (See Comments)  ? "muscle cramps"  ? Simvastatin Other (See Comments)  ? "muscle Cramps"  ? Wellbutrin [bupropion]   ? Gi upset.   ? Sulfamethoxazole-trimethoprim Rash  ? ?  ? ?  ?Medication List  ?  ? ?  ? Accurate as of October 26, 2021  4:25 PM. If you have any questions, ask your nurse or doctor.  ?  ?  ? ?  ? ?albuterol 108 (90 Base) MCG/ACT inhaler ?Commonly known as: VENTOLIN HFA ?INHALE 1 TO 2 PUFFS BY MOUTH EVERY 4 HOURS AS NEEDED FOR WHEEZING ?  ?aspirin 81 MG chewable tablet ?Chew 81 mg by mouth in the morning. ?  ?Breztri Aerosphere 160-9-4.8 MCG/ACT Aero ?Generic drug: Budeson-Glycopyrrol-Formoterol ?Inhale 2 puffs into the lungs 2 (two) times daily. ?  ?calcium-vitamin D 500-200 MG-UNIT Tabs tablet ?Commonly known as: OSCAL WITH D ?Take 1 tablet by mouth daily. ?  ?carvedilol 12.5 MG tablet ?Commonly known as: COREG ?Take 12.5 mg by mouth 2 (two) times daily with a meal. ?  ?CENTRUM SILVER ULTRA WOMENS  PO ?Take by mouth daily. ?  ?furosemide 20 MG tablet ?Commonly known as: LASIX ?Take 1 tablet (20 mg total) by mouth daily. ?What changed: when to take this ?  ?losartan 25 MG tablet ?Commonly known as: COZAAR ?Take 25 mg by mouth daily. ?  ?Magnesium 250 MG Tabs ?Take 250 mg by mouth daily. ?  ?metFORMIN 500 MG tablet ?Commonly known as: GLUCOPHAGE ?TAKE 1 TABLET BY MOUTH TWICE DAILY WITH MORNING MEAL AND WITH EVENING MEAL ?  ?nitroGLYCERIN 0.4 MG SL tablet ?Commonly known as: NITROSTAT ?DISSOLVE ONE TABLET UNDER THE TONGUE EVERY 5 MINUTES AS NEEDED FOR CHEST PAIN.  DO NOT EXCEED A TOTAL OF 3 DOSES IN 15 MINUTES ?  ?omeprazole 20 MG capsule ?Commonly known as: PRILOSEC ?Take 1 capsule (20 mg total) by mouth daily. ?  ?ondansetron 4 MG tablet ?Commonly known as: Zofran ?Take 1 tablet (4 mg total) by mouth every 8 (eight) hours as needed for nausea or vomiting. ?  ?OneTouch Delica Lancets 25D Misc ?USE 1  TO CHECK GLUCOSE ONCE DAILY *APPOINTMENT  REQUIRED  FOR  FUTURE  REFILLS ?  ?OneTouch Verio Flex System w/Device Kit ?Check sugar once daily ?  ?OneTouch Verio test strip ?Generic drug: glucose blood ?Use to check blood sugar once daily  DX CODE E11.9 ?  ?Rivaroxaban 15 MG Tabs tablet ?Commonly known as: Xarelto ?Take 1 tablet (15 mg total) by mouth daily. ?What changed:  ?when to take this ?additional instructions ?  ?venlafaxine XR 75 MG 24 hr capsule ?Commonly known as: Effexor XR ?Take 1 capsule (75 mg total) by mouth daily with breakfast. ?  ?Vitamin D-3 25 MCG (1000 UT) Caps ?Take 1,000 Units by mouth daily. ?  ? ?  ? ? ?Allergies:  ?Allergies  ?Allergen Reactions  ? Atorvastatin Other (See Comments)  ?  "muscle Cramps"  ? Budesonide Other (See Comments)  ?  "fatigue"  ? Gabapentin Other (See Comments)  ?  "fatigue"  ? Metoclopramide Other (See Comments)  ?  "mayalgia"  ? Rosuvastatin Other (See Comments)  ?  "muscle cramps"  ? Simvastatin Other (See Comments)  ?  "muscle Cramps"  ? Wellbutrin [Bupropion]   ?   Gi upset.   ? Sulfamethoxazole-Trimethoprim Rash  ? ? ?Past Medical History:  ?Diagnosis Date  ? Anemia   ? Anxiety and depression   ? Asthma   ? Atrioventricular block   ? CAD (coronary  artery disease)   ? Chronic renal insufficiency   ? Diabetes mellitus without complication ([place])   ? Family history of colon cancer   ? Fibromyalgia   ? GERD (gastroesophageal reflux disease)   ? Gout   ? Heart disease   ? History of colon polyps   ? Hypertension   ? Insomnia   ? Kidney disease   ? Kidney stones 01/12/1983  ? Major depression   ? Major depressive disorder, recurrent, mild ([place])   ? Migraines   ? Mixed hyperlipidemia   ? Osteoporosis 01/27/2019  ? Other amnesia   ? Rosacea   ? 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 Statin myopathy   ? Thyroid disease   ? Vitamin D deficiency   ? ? ?Past Surgical History:  ?Procedure Laterality Date  ? ABDOMINAL HYSTERECTOMY  03/05/1998  ? pt does not think this was a total hysterectomy  ? APPENDECTOMY  12/31/1988  ? BREAST LUMPECTOMY Left 05/22/1996  ? COLONOSCOPY  04/20/2016  ? Mild sigmoid diverticulosis. Otherwise normal colonoscopy  ? ESOPHAGOGASTRODUODENOSCOPY  11/16/2005  ? [place]. Patchy areas of mucosal erythema in the antrum compatible with gastritis. Polyps in the stomach. Nodule in the antrum. Medium hiatal hernia  ? pacemaker  09/27/2016  ? PACEMAKER REVISION  09/27/2020  ? PACEMAKER UPGRADE DUAL CHAMBER TO BIV  ? ? ?Family History  ?Problem Relation Age of Onset  ? High blood pressure Mother   ? Diabetes Mother   ?     no medication needed  ? Kidney disease Mother   ? Pneumonia Mother   ? High blood pressure Father   ? Colon cancer Father   ? Cancer Maternal Grandmother   ?     Leukemia  ? Cancer Other   ?     Ovarian  ? Rectal cancer Neg Hx   ? Stomach cancer Neg Hx   ? Esophageal cancer Neg Hx   ? Liver cancer Neg Hx   ? Pancreatic cancer Neg Hx   ? ? ?Social History:  reports that she has never smoked. She has never used smokeless  tobacco. She reports that she does not drink alcohol and does not use drugs. ? ?Review of Systems: ? ?Hypertension:  blood pressure is treated with Coreg, amlodipine and, treated by other physicians ?She has a monitor

## 2021-11-03 ENCOUNTER — Ambulatory Visit (HOSPITAL_COMMUNITY)
Admission: RE | Admit: 2021-11-03 | Discharge: 2021-11-03 | Disposition: A | Discharge status: Home / Self Care | Payer: Medicare Other | Source: Ambulatory Visit | Attending: Gastroenterology | Admitting: Gastroenterology

## 2021-11-03 ENCOUNTER — Other Ambulatory Visit: Payer: Self-pay

## 2021-11-03 DIAGNOSIS — K581 Irritable bowel syndrome with constipation: Secondary | ICD-10-CM | POA: Diagnosis not present

## 2021-11-03 DIAGNOSIS — K625 Hemorrhage of anus and rectum: Secondary | ICD-10-CM | POA: Insufficient documentation

## 2021-11-03 DIAGNOSIS — K224 Dyskinesia of esophagus: Secondary | ICD-10-CM | POA: Diagnosis not present

## 2021-11-03 DIAGNOSIS — R1319 Other dysphagia: Secondary | ICD-10-CM | POA: Diagnosis not present

## 2021-11-11 ENCOUNTER — Telehealth: Payer: Self-pay

## 2021-11-11 ENCOUNTER — Other Ambulatory Visit: Payer: Self-pay

## 2021-11-11 DIAGNOSIS — I110 Hypertensive heart disease with heart failure: Secondary | ICD-10-CM | POA: Diagnosis not present

## 2021-11-11 DIAGNOSIS — E1159 Type 2 diabetes mellitus with other circulatory complications: Secondary | ICD-10-CM

## 2021-11-11 DIAGNOSIS — J449 Chronic obstructive pulmonary disease, unspecified: Secondary | ICD-10-CM

## 2021-11-11 DIAGNOSIS — F32A Depression, unspecified: Secondary | ICD-10-CM | POA: Diagnosis not present

## 2021-11-11 DIAGNOSIS — I5030 Unspecified diastolic (congestive) heart failure: Secondary | ICD-10-CM

## 2021-11-11 DIAGNOSIS — E785 Hyperlipidemia, unspecified: Secondary | ICD-10-CM

## 2021-11-11 DIAGNOSIS — Z7984 Long term (current) use of oral hypoglycemic drugs: Secondary | ICD-10-CM | POA: Diagnosis not present

## 2021-11-11 DIAGNOSIS — R1319 Other dysphagia: Secondary | ICD-10-CM

## 2021-11-11 NOTE — Telephone Encounter (Signed)
Chart Review Routing History Since 11/12/2020 (View Full Routing History) ? ?Recipients Sent On Sent By Routed Reports  ? Dr. Abran Richard  ? 11/11/2021 12:27 PM Gillermina Hu, RN Telephone on 11/11/2021 with Gillermina Hu, RN  ?  ?  ? ?Pt was made aware of recent results and Dr. Lyndel Safe recommendations: Pt agreed to proceed with EGD: Pt scheduled for an EGD on 12/30/2021 at 10:00 AM with Dr. Lyndel Safe in [facility]: Pt made aware: Directions provided ?Prep instructions were created for pt and sent to pt via mail:Pt made aware ?Ambulatory GI referral placed in Epic ?Cardiology Clearance requested along with clearance to hold Xarelto 24 hours before procedure:  Sent to pt Cardiologist Dr. Abran Richard ?

## 2021-11-11 NOTE — Telephone Encounter (Signed)
[facility] Pre-operative Risk Assessment  ?   ?Bridget [name] ?Dec 20, 1940 ?[id number] ? ?Procedure: Upper Endoscopy ?Anesthesia type:  MAC ?Procedure Date: 12/30/2021 ?Provider: Dr. Lyndel Safe ? ?Type of Clearance needed: Pharmacy/Cardiac/ ? ?Medication(s) needing held: Xarelto  ? ?Length of time for medication to be held: 24  Hours Before  ? ?Please review request and advise by either responding to this message or by sending your response to the fax # provided below. ? ?Thank you, ? ?[facility]  ?Phone: [phone] ?Fax: [phone] ?ATTENTION: AMMIE EVERSOLE, LPN ? ? ? ?

## 2021-11-17 NOTE — Telephone Encounter (Signed)
Received cardiac clearance from Dr. Otho Perl. Patient made aware to hold Xarelto on 12/29/21  1 day prior to her procedure. Patient voiced understanding.  ?

## 2021-12-01 ENCOUNTER — Other Ambulatory Visit: Payer: Self-pay | Admitting: Endocrinology

## 2021-12-01 DIAGNOSIS — E119 Type 2 diabetes mellitus without complications: Secondary | ICD-10-CM

## 2021-12-14 ENCOUNTER — Telehealth: Payer: Self-pay

## 2021-12-14 NOTE — Progress Notes (Signed)
? ? ?Chronic Care Management ?Pharmacy Assistant  ? ?Name: Karinne Schmader  MRN: [id number] DOB: 11/04/1940 ? ? ?Reason for Encounter: Disease State call for DM  ? ?Recent office visits:  ?None ? ?Recent consult visits:  ?10/26/21 (Endocrinology) Elayne Snare MD. Seen for Thyroid Nodule. No med changes. ? ?10/24/21 Gertie Fey) Jackquline Denmark MD. Seen for IBS. Started on Omeprazole 55m daily. ? ?Hospital visits:  ?None ? ?Medications: ?Outpatient Encounter Medications as of 12/14/2021  ?Medication Sig  ? albuterol (VENTOLIN HFA) 108 (90 Base) MCG/ACT inhaler INHALE 1 TO 2 PUFFS BY MOUTH EVERY 4 HOURS AS NEEDED FOR WHEEZING  ? aspirin 81 MG chewable tablet Chew 81 mg by mouth in the morning.   ? Blood Glucose Monitoring Suppl (ONETOUCH VERIO FLEX SYSTEM) w/Device KIT Check sugar once daily  ? Budeson-Glycopyrrol-Formoterol (BREZTRI AEROSPHERE) 160-9-4.8 MCG/ACT AERO Inhale 2 puffs into the lungs 2 (two) times daily.  ? calcium-vitamin D (OSCAL WITH D) 500-200 MG-UNIT TABS tablet Take 1 tablet by mouth daily.  ? carvedilol (COREG) 12.5 MG tablet Take 12.5 mg by mouth 2 (two) times daily with a meal.  ? Cholecalciferol (VITAMIN D-3) 1000 UNITS CAPS Take 1,000 Units by mouth daily.  ? furosemide (LASIX) 20 MG tablet Take 1 tablet (20 mg total) by mouth daily. (Patient taking differently: Take 20 mg by mouth 2 (two) times daily.)  ? losartan (COZAAR) 25 MG tablet Take 25 mg by mouth daily.  ? lovastatin (MEVACOR) 40 MG tablet Take 1 tablet by mouth at bedtime.  ? Magnesium 250 MG TABS Take 250 mg by mouth daily.  ? metFORMIN (GLUCOPHAGE) 500 MG tablet TAKE 1 TABLET BY MOUTH TWICE DAILY WITH MORNING MEAL AND WITH EVENING MEAL  ? Multiple Vitamins-Minerals (CENTRUM SILVER ULTRA WOMENS PO) Take by mouth daily.  ? nitroGLYCERIN (NITROSTAT) 0.4 MG SL tablet DISSOLVE ONE TABLET UNDER THE TONGUE EVERY 5 MINUTES AS NEEDED FOR CHEST PAIN.  DO NOT EXCEED A TOTAL OF 3 DOSES IN 15 MINUTES  ? omeprazole (PRILOSEC) 20 MG capsule Take 1  capsule (20 mg total) by mouth daily.  ? ondansetron (ZOFRAN) 4 MG tablet Take 1 tablet (4 mg total) by mouth every 8 (eight) hours as needed for nausea or vomiting.  ? OneTouch Delica Lancets 390WMISC USE 1  TO CHECK GLUCOSE ONCE DAILY *APPOINTMENT  REQUIRED  FOR  FUTURE  REFILLS  ? ONETOUCH VERIO test strip Use to check blood sugar once daily  DX CODE E11.9  ? Rivaroxaban (XARELTO) 15 MG TABS tablet Take 1 tablet (15 mg total) by mouth daily. (Patient taking differently: Take 15 mg by mouth. Take 1 tablet every other day)  ? venlafaxine XR (EFFEXOR XR) 75 MG 24 hr capsule Take 1 capsule (75 mg total) by mouth daily with breakfast.  ? ?No facility-administered encounter medications on file as of 12/14/2021.  ? ? ?Recent Relevant Labs: ?Lab Results  ?Component Value Date/Time  ? HGBA1C 6.6 (A) 10/26/2021 08:33 AM  ? HGBA1C 6.5 (A) 07/26/2021 08:30 AM  ? HGBA1C 6.7 (H) 10/22/2020 09:01 AM  ? HGBA1C 6.3 (H) 11/20/2019 09:31 AM  ? MICROALBUR 4.0 (H) 03/25/2021 08:59 AM  ? MICROALBUR <0.7 03/23/2020 10:49 AM  ?  ?Kidney Function ?Lab Results  ?Component Value Date/Time  ? CREATININE 1.3 (A) 10/13/2021 12:00 AM  ? CREATININE 1.24 (H) 08/18/2021 03:31 PM  ? CREATININE 1.55 (H) 07/22/2021 10:05 AM  ? GFR 32.91 (L) 03/23/2020 08:52 AM  ? GFRNONAA 60 09/08/2020 02:42 PM  ? GIOXBD  69 09/08/2020 02:42 PM  ? ? ? ?Current antihyperglycemic regimen:  ?Metformin 543m- TAKE 1 TABLET BY MOUTH TWICE DAILY WITH MORNING MEAL AND WITH EVENING MEAL ? ?Adherence Review: ?Is the patient currently on a STATIN medication? Yes ?Is the patient currently on ACE/ARB medication? Yes ?Does the patient have >5 day gap between last estimated fill dates? CPP to review ? ?Care Gaps: ?Last eye exam / Retinopathy Screening? 01/12/21 ?Last Annual Wellness Visit? None noted  ?Last Diabetic Foot Exam? 10/22/20 ? ? ?Star Rating Drugs:  ?Medication:  Last Fill: Day Supply ?Metformin   12/02/21 90ds ?   08/26/21 90ds ? ?Unable to completed this call after several  attempts  ? ?[name] CMA ?Clinical Pharmacist Assistant  ?[phone] ?

## 2021-12-19 ENCOUNTER — Encounter: Payer: Self-pay | Admitting: Gastroenterology

## 2021-12-19 ENCOUNTER — Encounter: Payer: Self-pay | Admitting: Family Medicine

## 2021-12-19 ENCOUNTER — Ambulatory Visit (INDEPENDENT_AMBULATORY_CARE_PROVIDER_SITE_OTHER): Payer: Medicare Other | Admitting: Family Medicine

## 2021-12-19 VITALS — BP 118/72 | HR 79 | Temp 97.1°F | Ht 59.0 in | Wt 103.0 lb

## 2021-12-19 DIAGNOSIS — N1831 Chronic kidney disease, stage 3a: Secondary | ICD-10-CM

## 2021-12-19 DIAGNOSIS — E782 Mixed hyperlipidemia: Secondary | ICD-10-CM | POA: Diagnosis not present

## 2021-12-19 DIAGNOSIS — I10 Essential (primary) hypertension: Secondary | ICD-10-CM | POA: Diagnosis not present

## 2021-12-19 DIAGNOSIS — D6869 Other thrombophilia: Secondary | ICD-10-CM | POA: Diagnosis not present

## 2021-12-19 DIAGNOSIS — E1121 Type 2 diabetes mellitus with diabetic nephropathy: Secondary | ICD-10-CM

## 2021-12-19 DIAGNOSIS — Z86718 Personal history of other venous thrombosis and embolism: Secondary | ICD-10-CM | POA: Diagnosis not present

## 2021-12-19 DIAGNOSIS — J4530 Mild persistent asthma, uncomplicated: Secondary | ICD-10-CM

## 2021-12-19 DIAGNOSIS — F330 Major depressive disorder, recurrent, mild: Secondary | ICD-10-CM | POA: Diagnosis not present

## 2021-12-19 NOTE — Progress Notes (Signed)
? ?Subjective:  ?Patient ID: Bridget [name], female    DOB: 1941/05/18  Age: 81 y.o. MRN: [id number] ? ?Chief Complaint  ?Patient presents with  ? Diabetes  ? Hyperlipidemia  ? Hypertension  ? ? ?Diabetes:  ?Complications: htn ?Most recent A1C: 6.6. ?Current medications: Metformin 500 mg take 1 tablet twice daily. ?Last Eye Exam:01/12/2021. ?Foot checks: daily ?Dr. Dwyane Dee endocrinologist.  ? ?Hyperlipidemia: ?Current medications:Lovastatin 40 mg take 1 tablet daily ? ?Hypertension: ?Current medications: Losartan 25 mg take 1 tablet by mouth daily and coreg 12.5 mg one twice daily.  ? ?Depression: Patient is currently taking Venlafaxine XR 75 mg take 1 capsule daily. ?Improved, but still has anhedonia. Tired a lot. No energy.  ? ?GERD: on omeprazole 20 mg daily. Having dysphagia. Scheduled for EGD later this month with Dr. Lyndel Safe.  ? ?History of DVT: xarelto 15 mg once daily.  ? ?Mild Persistent Asthma: Patient is currently taking Breztri 2 puffs twice daily ? ?Diet: good ?Exercise: minimal ? ?Current Outpatient Medications on File Prior to Visit  ?Medication Sig Dispense Refill  ? albuterol (VENTOLIN HFA) 108 (90 Base) MCG/ACT inhaler INHALE 1 TO 2 PUFFS BY MOUTH EVERY 4 HOURS AS NEEDED FOR WHEEZING 9 g 0  ? aspirin 81 MG chewable tablet Chew 81 mg by mouth in the morning.     ? Blood Glucose Monitoring Suppl (ONETOUCH VERIO FLEX SYSTEM) w/Device KIT Check sugar once daily 1 kit 0  ? Budeson-Glycopyrrol-Formoterol (BREZTRI AEROSPHERE) 160-9-4.8 MCG/ACT AERO Inhale 2 puffs into the lungs 2 (two) times daily. 32.1 g 3  ? calcium-vitamin D (OSCAL WITH D) 500-200 MG-UNIT TABS tablet Take 1 tablet by mouth daily.    ? carvedilol (COREG) 12.5 MG tablet Take 12.5 mg by mouth 2 (two) times daily with a meal.    ? Cholecalciferol (VITAMIN D-3) 1000 UNITS CAPS Take 1,000 Units by mouth daily.    ? furosemide (LASIX) 20 MG tablet Take 1 tablet (20 mg total) by mouth daily. (Patient taking differently: Take 20 mg by mouth 2  (two) times daily.) 30 tablet 0  ? losartan (COZAAR) 25 MG tablet Take 25 mg by mouth daily.    ? Magnesium 250 MG TABS Take 250 mg by mouth daily.    ? metFORMIN (GLUCOPHAGE) 500 MG tablet TAKE 1 TABLET BY MOUTH TWICE DAILY WITH MORNING MEAL AND WITH EVENING MEAL 180 tablet 0  ? Multiple Vitamins-Minerals (CENTRUM SILVER ULTRA WOMENS PO) Take by mouth daily.    ? nitroGLYCERIN (NITROSTAT) 0.4 MG SL tablet DISSOLVE ONE TABLET UNDER THE TONGUE EVERY 5 MINUTES AS NEEDED FOR CHEST PAIN.  DO NOT EXCEED A TOTAL OF 3 DOSES IN 15 MINUTES 25 tablet 0  ? nitroGLYCERIN (NITROSTAT) 0.4 MG SL tablet Place under the tongue.    ? omeprazole (PRILOSEC) 20 MG capsule Take 1 capsule (20 mg total) by mouth daily. 90 capsule 3  ? ondansetron (ZOFRAN) 4 MG tablet Take 1 tablet (4 mg total) by mouth every 8 (eight) hours as needed for nausea or vomiting. 40 tablet 0  ? OneTouch Delica Lancets 53Z MISC USE 1  TO CHECK GLUCOSE ONCE DAILY *APPOINTMENT  REQUIRED  FOR  FUTURE  REFILLS 100 each 3  ? ONETOUCH VERIO test strip Use to check blood sugar once daily  DX CODE E11.9 100 each 1  ? polyethylene glycol powder (GLYCOLAX/MIRALAX) 17 GM/SCOOP powder polyethylene glycol 3350 17 gram oral powder packet ? Take 1 packet every day by oral route.    ?  venlafaxine XR (EFFEXOR XR) 75 MG 24 hr capsule Take 1 capsule (75 mg total) by mouth daily with breakfast. 90 capsule 1  ? Rivaroxaban (XARELTO) 15 MG TABS tablet Take 1 tablet (15 mg total) by mouth daily. (Patient taking differently: Take 15 mg by mouth. Take 1 tablet every other day) 90 tablet 0  ? ?No current facility-administered medications on file prior to visit.  ? ?Past Medical History:  ?Diagnosis Date  ? Anemia   ? Anxiety and depression   ? Asthma   ? Atrioventricular block   ? CAD (coronary artery disease)   ? Chronic renal insufficiency   ? Diabetes mellitus without complication ([place])   ? Family history of colon cancer   ? Fibromyalgia   ? GERD (gastroesophageal reflux disease)   ?  Gout   ? Heart disease   ? History of colon polyps   ? Hypertension   ? Insomnia   ? Kidney disease   ? Kidney stones 01/12/1983  ? Major depression   ? Major depressive disorder, recurrent, mild ([place])   ? Migraines   ? Mixed hyperlipidemia   ? Osteoporosis 01/27/2019  ? Other amnesia   ? Rosacea   ? 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 Statin myopathy   ? Thyroid disease   ? Vitamin D deficiency   ? ?Past Surgical History:  ?Procedure Laterality Date  ? ABDOMINAL HYSTERECTOMY  03/05/1998  ? pt does not think this was a total hysterectomy  ? APPENDECTOMY  12/31/1988  ? BREAST LUMPECTOMY Left 05/22/1996  ? COLONOSCOPY  04/20/2016  ? Mild sigmoid diverticulosis. Otherwise normal colonoscopy  ? ESOPHAGOGASTRODUODENOSCOPY  11/16/2005  ? [place]. Patchy areas of mucosal erythema in the antrum compatible with gastritis. Polyps in the stomach. Nodule in the antrum. Medium hiatal hernia  ? pacemaker  09/27/2016  ? PACEMAKER REVISION  09/27/2020  ? PACEMAKER UPGRADE DUAL CHAMBER TO BIV  ?  ?Family History  ?Problem Relation Age of Onset  ? High blood pressure Mother   ? Diabetes Mother   ?     no medication needed  ? Kidney disease Mother   ? Pneumonia Mother   ? High blood pressure Father   ? Colon cancer Father   ? Cancer Maternal Grandmother   ?     Leukemia  ? Cancer Other   ?     Ovarian  ? Rectal cancer Neg Hx   ? Stomach cancer Neg Hx   ? Esophageal cancer Neg Hx   ? Liver cancer Neg Hx   ? Pancreatic cancer Neg Hx   ? ?Social History  ? ?Socioeconomic History  ? Marital status: Married  ?  Spouse name: Darryl  ? Number of children: 3  ? Years of education: Not on file  ? Highest education level: 9th grade  ?Occupational History  ? Occupation: Retired  ?Tobacco Use  ? Smoking status: Never  ? Smokeless tobacco: Never  ?Vaping Use  ? Vaping Use: Never used  ?Substance and Sexual Activity  ? Alcohol use: Never  ? Drug use: Never  ? Sexual activity: Not on file  ?Other Topics Concern   ? Not on file  ?Social History Narrative  ? Lives at home with her husband  ? Right handed  ? Caffeine: mostly tea, 0-2 cups a day  ? ?Social Determinants of Health  ? ?Financial Resource Strain: High Risk  ? Difficulty of Paying Living Expenses: Very hard  ?Food Insecurity:  No Food Insecurity  ? Worried About Charity fundraiser in the Last Year: Never true  ? Ran Out of Food in the Last Year: Never true  ?Transportation Needs: No Transportation Needs  ? Lack of Transportation (Medical): No  ? Lack of Transportation (Non-Medical): No  ?Physical Activity: Not on file  ?Stress: Not on file  ?Social Connections: Not on file  ? ? ?Review of Systems  ?Constitutional:  Negative for appetite change, fatigue and fever.  ?HENT:  Negative for congestion, ear pain, sinus pressure and sore throat.   ?Respiratory:  Negative for cough, chest tightness, shortness of breath and wheezing.   ?Cardiovascular:  Negative for chest pain and palpitations.  ?Gastrointestinal:  Negative for abdominal pain, constipation, diarrhea, nausea and vomiting.  ?Genitourinary:  Negative for dysuria and hematuria.  ?Musculoskeletal:  Negative for arthralgias, back pain, joint swelling and myalgias.  ?Skin:  Negative for rash.  ?Neurological:  Negative for dizziness, weakness and headaches.  ?Psychiatric/Behavioral:  Positive for dysphoric mood. The patient is not nervous/anxious.   ? ? ? ?Objective:  ?BP 118/72 (BP Location: Left Arm, Patient Position: Sitting)   Pulse 79   Temp (!) 97.1 ?F (36.2 ?C) (Oral)   Ht 4' 11"  (1.499 m)   Wt 103 lb (46.7 kg)   SpO2 97%   BMI 20.80 kg/m?  ? ? ?  12/19/2021  ?  8:44 AM 10/26/2021  ?  8:09 AM 10/24/2021  ?  8:48 AM  ?BP/Weight  ?Systolic BP 967 893 810  ?Diastolic BP 72 66 78  ?Wt. (Lbs) 103 102.8 104  ?BMI 20.8 kg/m2 20.76 kg/m2 21.01 kg/m2  ? ? ?Physical Exam ?Vitals reviewed.  ?Constitutional:   ?   Appearance: Normal appearance. She is normal weight.  ?Cardiovascular:  ?   Rate and Rhythm: Normal rate  and regular rhythm.  ?   Pulses: Normal pulses.  ?   Heart sounds: Normal heart sounds.  ?Pulmonary:  ?   Effort: Pulmonary effort is normal.  ?   Breath sounds: Normal breath sounds.  ?Abdominal:  ?   General:

## 2021-12-19 NOTE — Addendum Note (Signed)
Addended byBlane Ohara on: 12/19/2021 09:22 AM ? ? Modules accepted: Orders ? ?

## 2021-12-20 LAB — MICROALBUMIN / CREATININE URINE RATIO
Creatinine, Urine: 105.9 mg/dL
Microalb/Creat Ratio: 11 mg/g creat (ref 0–29)
Microalbumin, Urine: 11.3 ug/mL

## 2021-12-20 LAB — CBC WITH DIFF/PLATELET
Basophils Absolute: 0.1 10*3/uL (ref 0.0–0.2)
Basos: 1 %
EOS (ABSOLUTE): 0.3 10*3/uL (ref 0.0–0.4)
Eos: 4 %
Hematocrit: 34.9 % (ref 34.0–46.6)
Hemoglobin: 11.4 g/dL (ref 11.1–15.9)
Immature Grans (Abs): 0 10*3/uL (ref 0.0–0.1)
Immature Granulocytes: 1 %
Lymphocytes Absolute: 1.2 10*3/uL (ref 0.7–3.1)
Lymphs: 16 %
MCH: 28.6 pg (ref 26.6–33.0)
MCHC: 32.7 g/dL (ref 31.5–35.7)
MCV: 88 fL (ref 79–97)
Monocytes Absolute: 0.5 10*3/uL (ref 0.1–0.9)
Monocytes: 6 %
Neutrophils Absolute: 5.4 10*3/uL (ref 1.4–7.0)
Neutrophils: 72 %
Platelets: 214 10*3/uL (ref 150–450)
RBC: 3.99 x10E6/uL (ref 3.77–5.28)
RDW: 12.9 % (ref 11.7–15.4)
WBC: 7.4 10*3/uL (ref 3.4–10.8)

## 2021-12-20 LAB — COMPREHENSIVE METABOLIC PANEL
ALT: 12 IU/L (ref 0–32)
AST: 20 IU/L (ref 0–40)
Albumin/Globulin Ratio: 1.7 (ref 1.2–2.2)
Albumin: 3.9 g/dL (ref 3.7–4.7)
Alkaline Phosphatase: 128 IU/L — ABNORMAL HIGH (ref 44–121)
BUN/Creatinine Ratio: 23 (ref 12–28)
BUN: 29 mg/dL — ABNORMAL HIGH (ref 8–27)
Bilirubin Total: 0.4 mg/dL (ref 0.0–1.2)
CO2: 22 mmol/L (ref 20–29)
Calcium: 9.5 mg/dL (ref 8.7–10.3)
Chloride: 102 mmol/L (ref 96–106)
Creatinine, Ser: 1.28 mg/dL — ABNORMAL HIGH (ref 0.57–1.00)
Globulin, Total: 2.3 g/dL (ref 1.5–4.5)
Glucose: 121 mg/dL — ABNORMAL HIGH (ref 70–99)
Potassium: 4.2 mmol/L (ref 3.5–5.2)
Sodium: 142 mmol/L (ref 134–144)
Total Protein: 6.2 g/dL (ref 6.0–8.5)
eGFR: 43 mL/min/{1.73_m2} — ABNORMAL LOW (ref >59)

## 2021-12-20 LAB — LIPID PANEL
Chol/HDL Ratio: 3.3 ratio (ref 0.0–4.4)
Cholesterol, Total: 232 mg/dL — ABNORMAL HIGH (ref 100–199)
HDL: 71 mg/dL (ref >39)
LDL Chol Calc (NIH): 130 mg/dL — ABNORMAL HIGH (ref 0–99)
Triglycerides: 178 mg/dL — ABNORMAL HIGH (ref 0–149)
VLDL Cholesterol Cal: 31 mg/dL (ref 5–40)

## 2021-12-21 ENCOUNTER — Other Ambulatory Visit: Payer: Self-pay

## 2021-12-21 MED ORDER — LOVASTATIN 40 MG PO TABS: 40.0000 mg | ORAL_TABLET | Freq: Every day | ORAL | 1 refills | Status: DC

## 2021-12-21 NOTE — Progress Notes (Signed)
Blood count normal.  ?Liver function normal.  ?Kidney function abnormal, but stable.  ?Cholesterol: LDL 130, Trigs 178. Please check if patient still taking lovastatin 40 mg before bed. Restart if stopped. If still taking, recommend add zetia 10 mg daily.  ?No spilled protein. ?

## 2021-12-23 DIAGNOSIS — Z9889 Other specified postprocedural states: Secondary | ICD-10-CM | POA: Diagnosis not present

## 2021-12-23 DIAGNOSIS — I442 Atrioventricular block, complete: Secondary | ICD-10-CM | POA: Diagnosis not present

## 2021-12-23 DIAGNOSIS — I110 Hypertensive heart disease with heart failure: Secondary | ICD-10-CM | POA: Diagnosis not present

## 2021-12-23 DIAGNOSIS — Z45018 Encounter for adjustment and management of other part of cardiac pacemaker: Secondary | ICD-10-CM | POA: Diagnosis not present

## 2021-12-23 DIAGNOSIS — I5020 Unspecified systolic (congestive) heart failure: Secondary | ICD-10-CM | POA: Diagnosis not present

## 2021-12-23 DIAGNOSIS — I428 Other cardiomyopathies: Secondary | ICD-10-CM | POA: Diagnosis not present

## 2021-12-25 ENCOUNTER — Telehealth: Payer: Self-pay | Admitting: *Deleted

## 2021-12-25 NOTE — Telephone Encounter (Signed)
Dr. Chales Abrahams,  This pt is scheduled with you for a procedure on 12/30/21.  Her most recent cardiac EF is 20-25%; so procedure will need to be done at the hospital.  Thanks,  Cathlyn Parsons

## 2021-12-26 ENCOUNTER — Telehealth: Payer: Self-pay

## 2021-12-26 NOTE — Assessment & Plan Note (Signed)
Continue xarelto 15 mg daily. Needed due to dvts.  ?

## 2021-12-26 NOTE — Assessment & Plan Note (Signed)
The current medical regimen is effective;  continue present plan and medications.  

## 2021-12-26 NOTE — Assessment & Plan Note (Signed)
Control: good ?Recommend check sugars fasting daily. ?Recommend check feet daily. ?Recommend annual eye exams. ?Medicines: no changes ?Continue to work on eating a healthy diet and exercise.  ?Management per specialist.  ? ? ?

## 2021-12-26 NOTE — Assessment & Plan Note (Signed)
Needs xarelto

## 2021-12-26 NOTE — Assessment & Plan Note (Signed)
Check labs 

## 2021-12-26 NOTE — Telephone Encounter (Signed)
Received call from Pt husband Bridget [name]  in regard to diabetic medication in regard to  procedure this Friday: Chart reviewed:  ?Observed what Bridget [name] stated as follows: ? ? ?Dr. Chales Abrahams, ?  ?This pt is scheduled with you for a procedure on 12/30/21.  Her most recent cardiac EF is 20-25%; so procedure will need to be done at the hospital. ?  ?Thanks, ?  ?Bridget [name]     ? ?Bridget [name] was notified that this procedure will probably  be  canceled and moved to the hospital  ?Please advise  ? ?

## 2021-12-26 NOTE — Assessment & Plan Note (Signed)
Well controlled.  ?No changes to medicines.  ?Continue to work on eating a healthy diet and exercise.  ?Labs drawn today.  ?

## 2021-12-27 NOTE — Telephone Encounter (Signed)
We received a call from patient. She cancelled procedure scheduled 12/30/21 for personal reasons. Patient requested a call from nurse to discuss procedure at [facility]. Please advise. ?

## 2021-12-27 NOTE — Telephone Encounter (Signed)
Pt was made aware of Dr. Chales Abrahams recommendations: Pt given explanation of why procedure was changed. Pt stated that makes her a little nervous. Pt was hesitate about wanting the procedure done at the hospital. Pt was notified that she could think it through tonight and I would give her a call back tomorrow. Procedure in the [facility] has been canceled.  ?Pt verbalized understanding with all questions answered.   ?

## 2021-12-27 NOTE — Telephone Encounter (Signed)
Lets arrange for [facility] with me ?RG ?

## 2021-12-28 NOTE — Telephone Encounter (Signed)
Pt agreed to having the procedure in the Hospital:  ?Schedule for [facility] reviewed and it has no availability for Dr. Chales Abrahams through July: August Schedule not available yet:   Pt made aware: ?Pt placed on personal reminder to schedule pt when August Schedule comes out. Pt made aware.  ?

## 2021-12-30 ENCOUNTER — Encounter: Payer: Medicare Other | Admitting: Gastroenterology

## 2022-01-05 DIAGNOSIS — R799 Abnormal finding of blood chemistry, unspecified: Secondary | ICD-10-CM | POA: Diagnosis not present

## 2022-01-05 DIAGNOSIS — I10 Essential (primary) hypertension: Secondary | ICD-10-CM | POA: Diagnosis not present

## 2022-01-05 DIAGNOSIS — Z45018 Encounter for adjustment and management of other part of cardiac pacemaker: Secondary | ICD-10-CM | POA: Diagnosis not present

## 2022-01-05 DIAGNOSIS — I428 Other cardiomyopathies: Secondary | ICD-10-CM | POA: Diagnosis not present

## 2022-01-05 DIAGNOSIS — I471 Supraventricular tachycardia: Secondary | ICD-10-CM | POA: Diagnosis not present

## 2022-02-02 ENCOUNTER — Telehealth: Payer: Self-pay

## 2022-02-02 NOTE — Telephone Encounter (Unsigned)
Left Message for pt to call back to schedule Upper Endoscopy

## 2022-02-03 ENCOUNTER — Telehealth: Payer: Self-pay

## 2022-02-03 ENCOUNTER — Other Ambulatory Visit: Payer: Self-pay

## 2022-02-03 DIAGNOSIS — R1319 Other dysphagia: Secondary | ICD-10-CM

## 2022-02-03 NOTE — Telephone Encounter (Signed)
Please forward cardiac clearance to the patient's primary cardiologist, Dr. Judithe Modest who is a part of [facility] system.  This patient is new to [name].

## 2022-02-03 NOTE — Telephone Encounter (Signed)
Note previously routed to Dr. Judithe Modest  pt primary cardiologist

## 2022-02-20 ENCOUNTER — Telehealth: Payer: Self-pay

## 2022-02-20 NOTE — Telephone Encounter (Signed)
Received Cardiac Clearance for pt to hold Xarelto for 2 days for procedure: 04/12/2022 and 04/13/2022  Pt made aware Documents copied and also send to be scanned in:

## 2022-02-21 ENCOUNTER — Telehealth: Payer: Self-pay

## 2022-02-21 NOTE — Progress Notes (Signed)
Chronic Care Management Pharmacy Assistant   Name: Bridget [name]  MRN: [id number] DOB: 28-Oct-1940   Reason for Encounter: Disease State call for DM    Recent office visits:  12/19/21 Bridget Brome MD. Seen for routine visit. Please check if patient still taking lovastatin 40 mg before bed. Restart if stopped. If still taking, recommend add zetia 10 mg daily.   Recent consult visits:  01/12/22 (Cardiology) Bridget Greaves RN. Orders only. Ordered Xarelto 52m.   12/23/21 (Cardiology) Bridget DaringMD. Seen for HTN. No med changes.   Hospital visits:  None  Medications: Outpatient Encounter Medications as of 02/21/2022  Medication Sig   albuterol (VENTOLIN HFA) 108 (90 Base) MCG/ACT inhaler INHALE 1 TO 2 PUFFS BY MOUTH EVERY 4 HOURS AS NEEDED FOR WHEEZING   aspirin 81 MG chewable tablet Chew 81 mg by mouth in the morning.    Blood Glucose Monitoring Suppl (ONETOUCH VERIO FLEX SYSTEM) w/Device KIT Check sugar once daily   Budeson-Glycopyrrol-Formoterol (BREZTRI AEROSPHERE) 160-9-4.8 MCG/ACT AERO Inhale 2 puffs into the lungs 2 (two) times daily.   calcium-vitamin D (OSCAL WITH D) 500-200 MG-UNIT TABS tablet Take 1 tablet by mouth daily.   carvedilol (COREG) 12.5 MG tablet Take 12.5 mg by mouth 2 (two) times daily with a meal.   Cholecalciferol (VITAMIN D-3) 1000 UNITS CAPS Take 1,000 Units by mouth daily.   furosemide (LASIX) 20 MG tablet Take 1 tablet (20 mg total) by mouth daily. (Patient taking differently: Take 20 mg by mouth 2 (two) times daily.)   losartan (COZAAR) 25 MG tablet Take 25 mg by mouth daily.   lovastatin (MEVACOR) 40 MG tablet Take 1 tablet (40 mg total) by mouth at bedtime.   Magnesium 250 MG TABS Take 250 mg by mouth daily.   metFORMIN (GLUCOPHAGE) 500 MG tablet TAKE 1 TABLET BY MOUTH TWICE DAILY WITH MORNING MEAL AND WITH EVENING MEAL   Multiple Vitamins-Minerals (CENTRUM SILVER ULTRA WOMENS PO) Take by mouth daily.   nitroGLYCERIN (NITROSTAT) 0.4  MG SL tablet DISSOLVE ONE TABLET UNDER THE TONGUE EVERY 5 MINUTES AS NEEDED FOR CHEST PAIN.  DO NOT EXCEED A TOTAL OF 3 DOSES IN 15 MINUTES   nitroGLYCERIN (NITROSTAT) 0.4 MG SL tablet Place under the tongue.   omeprazole (PRILOSEC) 20 MG capsule Take 1 capsule (20 mg total) by mouth daily.   ondansetron (ZOFRAN) 4 MG tablet Take 1 tablet (4 mg total) by mouth every 8 (eight) hours as needed for nausea or vomiting.   OneTouch Delica Lancets 308UMISC USE 1  TO CHECK GLUCOSE ONCE DAILY *APPOINTMENT  REQUIRED  FOR  FUTURE  REFILLS   ONETOUCH VERIO test strip Use to check blood sugar once daily  DX CODE E11.9   polyethylene glycol powder (GLYCOLAX/MIRALAX) 17 GM/SCOOP powder polyethylene glycol 3350 17 gram oral powder packet  Take 1 packet every day by oral route.   Rivaroxaban (XARELTO) 15 MG TABS tablet Take 1 tablet (15 mg total) by mouth daily. (Patient taking differently: Take 15 mg by mouth. Take 1 tablet every other day)   venlafaxine XR (EFFEXOR XR) 75 MG 24 hr capsule Take 1 capsule (75 mg total) by mouth daily with breakfast.   No facility-administered encounter medications on file as of 02/21/2022.    Recent Relevant Labs: Lab Results  Component Value Date/Time   HGBA1C 6.6 (A) 10/26/2021 08:33 AM   HGBA1C 6.5 (A) 07/26/2021 08:30 AM   HGBA1C 6.7 (H) 10/22/2020 09:01 AM   HGBA1C  6.3 (H) 11/20/2019 09:31 AM   MICROALBUR 4.0 (H) 03/25/2021 08:59 AM   MICROALBUR <0.7 03/23/2020 10:49 AM    Kidney Function Lab Results  Component Value Date/Time   CREATININE 1.28 (H) 12/19/2021 09:37 AM   CREATININE 1.3 (A) 10/13/2021 12:00 AM   CREATININE 1.24 (H) 08/18/2021 03:31 PM   GFR 32.91 (L) 03/23/2020 08:52 AM   GFRNONAA 60 09/08/2020 02:42 PM   GFRAA 69 09/08/2020 02:42 PM     Current antihyperglycemic regimen:  Metformin 543m- TAKE 1 TABLET BY MOUTH TWICE DAILY WITH MORNING MEAL AND WITH EVENING MEAL Patient verbally confirms she is taking the above medications as directed.  Yes  What recent interventions/DTPs have been made to improve glycemic control:  PT has been eating healthy and watching carbs  Have there been any recent hospitalizations or ED visits since last visit with CPP? No  Patient denies hypoglycemic symptoms, including None  Patient denies hyperglycemic symptoms, including none  How often are you checking your blood sugar? once daily  What are your blood sugars ranging?  Fasting: 138 02/19/22 101 02/20/22 129 02/21/22  On insulin? No  During the week, how often does your blood glucose drop below 70? Never  Are you checking your feet daily/regularly? Yes  Adherence Review: Is the patient currently on a STATIN medication? Yes Is the patient currently on ACE/ARB medication? Yes Does the patient have >5 day gap between last estimated fill dates? CPP to review  Care Gaps: Last eye exam / Retinopathy Screening? 01/12/21 Last Annual Wellness Visit? No showed 01/16/22 visit Last Diabetic Foot Exam? 12/19/21  Star Rating Drugs:  Medication:  Last Fill: Day Supply Metformin                    12/02/21            90ds                                     08/26/21            90ds  [name] CFairviewPharmacist Assistant  [phone]

## 2022-02-27 ENCOUNTER — Ambulatory Visit (AMBULATORY_SURGERY_CENTER): Payer: Self-pay | Admitting: *Deleted

## 2022-02-27 VITALS — Ht 59.0 in | Wt 105.6 lb

## 2022-02-27 DIAGNOSIS — R1310 Dysphagia, unspecified: Secondary | ICD-10-CM

## 2022-02-27 NOTE — Progress Notes (Signed)
No egg or soy allergy known to patient  No issues known to pt with past sedation with any surgeries or procedures Patient denies ever being told they had issues or difficulty with intubation  No FH of Malignant Hyperthermia Pt is not on diet pills Pt is not on  home 02  Pt is  on blood thinners -XARELTO Pt denies issues with constipation  H/O A fib or A flutter Have any cardiac testing pending--NO   PT.HAS PACEMAKER

## 2022-03-01 ENCOUNTER — Other Ambulatory Visit: Payer: Self-pay | Admitting: Endocrinology

## 2022-03-01 DIAGNOSIS — E119 Type 2 diabetes mellitus without complications: Secondary | ICD-10-CM

## 2022-03-07 ENCOUNTER — Ambulatory Visit (INDEPENDENT_AMBULATORY_CARE_PROVIDER_SITE_OTHER): Payer: Medicare Other | Admitting: Family Medicine

## 2022-03-07 VITALS — BP 136/68 | HR 88 | Temp 96.8°F | Ht 59.0 in | Wt 99.0 lb

## 2022-03-07 DIAGNOSIS — M79671 Pain in right foot: Secondary | ICD-10-CM | POA: Insufficient documentation

## 2022-03-07 DIAGNOSIS — N1832 Chronic kidney disease, stage 3b: Secondary | ICD-10-CM

## 2022-03-07 MED ORDER — COLCHICINE 0.6 MG PO TABS: 0.6000 mg | ORAL_TABLET | Freq: Two times a day (BID) | ORAL | 0 refills | Status: DC

## 2022-03-07 MED ORDER — CEPHALEXIN 500 MG PO CAPS: 500.0000 mg | ORAL_CAPSULE | Freq: Two times a day (BID) | ORAL | 0 refills | Status: DC

## 2022-03-07 NOTE — Patient Instructions (Signed)
Gout versus skin infection. Colchicine 0.6 mg twice daily with food for 1 week for gout. Keflex 500 mg twice daily for for 1 week for possible cellulitis. Elevate.  May ice.  May use Tylenol. Follow-up in 48 hours with Flonnie Hailstone, NP

## 2022-03-07 NOTE — Progress Notes (Unsigned)
Acute Office Visit  Subjective:    Patient ID: Bridget [name], female    DOB: 03-24-1941, 81 y.o.   MRN: [id number]  No chief complaint on file.   HPI: Patient is in today for right foot swelling and painful. Increased pain with walking. No injury.  Taking blood thinner. No fevers.   Past Medical History:  Diagnosis Date   Anemia    Anxiety and depression    Arthritis    HANDS,ARMS,FEET   Asthma    Atrioventricular block    CAD (coronary artery disease)    CHF (congestive heart failure) (HCC)    Chronic renal insufficiency    Clotting disorder (HCC)    COPD (chronic obstructive pulmonary disease) (HCC)    Diabetes mellitus without complication ([place])    Family history of colon cancer    Fibromyalgia    GERD (gastroesophageal reflux disease)    Gout    Heart disease    Heart murmur    History of colon polyps    Hypertension    Insomnia    Kidney disease    Kidney stones 01/12/1983   Major depression    Major depressive disorder, recurrent, mild (HCC)    Migraines    Mixed hyperlipidemia    Osteoporosis 01/27/2019   Other amnesia    Rosacea    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 Otherwise normal colonoscopy   ESOPHAGOGASTRODUODENOSCOPY  11/16/2005   [place]. Patchy areas of mucosal erythema in the antrum compatible with gastritis. Polyps in the stomach. Nodule in the antrum. Medium hiatal hernia   pacemaker  09/27/2016   PACEMAKER REVISION  09/27/2020   PACEMAKER UPGRADE DUAL CHAMBER TO BIV    Family History  Problem Relation Age of Onset   High blood pressure Mother     Diabetes Mother        no medication needed   Kidney disease Mother    Pneumonia Mother    Colon polyps Father    High blood pressure Father    Colon cancer Father    Cancer Maternal Grandmother        Leukemia   Cancer Other        Ovarian   Rectal cancer Neg Hx    Stomach cancer Neg Hx    Esophageal cancer Neg Hx    Liver cancer Neg Hx    Pancreatic cancer Neg Hx    Crohn's disease Neg Hx     Social History   Socioeconomic History   Marital status: Married    Spouse name: Darryl   Number of children: 3   Years of education: Not on file   Highest education level: 9th grade  Occupational History   Occupation: Retired  Tobacco Use   Smoking status: Never    Passive exposure: Past   Smokeless tobacco: Never  Vaping Use   Vaping Use: Never used  Substance and Sexual Activity   Alcohol use: Never   Drug use: Never   Sexual activity: Not on file  Other Topics Concern   Not on file  Social History Narrative   Lives at home with her husband   Right handed   Caffeine: mostly tea, 0-2 cups a day   Social Determinants of Health   Financial Resource Strain: High Risk (10/19/2021)   Overall Financial Resource Strain (CARDIA)    Difficulty of Paying Living Expenses: Very hard  Food Insecurity: Not on file  Transportation Needs: No Transportation Needs (10/19/2021)   PRAPARE - Hydrologist (Medical): No    Lack of Transportation (Non-Medical): No  Physical Activity: Not on file  Stress: Not on file  Social Connections: Not on file  Intimate Partner Violence: Not At Risk (01/13/2021)   Humiliation, Afraid, Rape, and Kick questionnaire    Fear of Current or Ex-Partner: No    Emotionally Abused: No    Physically Abused: No    Sexually Abused: No    Outpatient Medications Prior to Visit  Medication Sig Dispense Refill   albuterol (VENTOLIN HFA) 108 (90 Base) MCG/ACT inhaler INHALE 1 TO 2 PUFFS BY MOUTH EVERY 4 HOURS AS NEEDED FOR WHEEZING 9 g 0    aspirin 81 MG chewable tablet Chew 81 mg by mouth in the morning.      Blood Glucose Monitoring Suppl (ONETOUCH VERIO FLEX SYSTEM) w/Device KIT Check sugar once daily 1 kit 0   Budeson-Glycopyrrol-Formoterol (BREZTRI AEROSPHERE) 160-9-4.8 MCG/ACT AERO Inhale 2 puffs into the lungs 2 (two) times daily. 32.1 g 3   calcium-vitamin D (OSCAL WITH D) 500-200 MG-UNIT TABS tablet Take 1 tablet by mouth daily.     carvedilol (COREG) 12.5 MG tablet Take 12.5 mg by mouth 2 (two) times daily with a meal.     Cholecalciferol (VITAMIN D-3) 1000 UNITS CAPS Take 1,000 Units by mouth daily.     furosemide (LASIX) 20 MG tablet Take 1 tablet (20 mg total) by mouth daily. (Patient taking differently: Take 20 mg by mouth 2 (two) times daily.) 30 tablet 0   losartan (COZAAR) 25 MG tablet Take 25 mg by mouth daily.     lovastatin (MEVACOR) 40 MG tablet Take 1 tablet (40 mg total) by mouth at bedtime. 90 tablet 1   Magnesium 250 MG TABS Take 250 mg by mouth daily.     metFORMIN (GLUCOPHAGE) 500 MG tablet TAKE 1 TABLET BY MOUTH TWICE DAILY WITH MORNING MEAL AND WITH EVENING MEAL 180 tablet 0   Multiple Vitamins-Minerals (CENTRUM SILVER ULTRA WOMENS PO) Take by mouth daily.     nitroGLYCERIN (NITROSTAT) 0.4 MG SL tablet DISSOLVE ONE TABLET UNDER THE TONGUE EVERY 5 MINUTES AS NEEDED FOR CHEST PAIN.  DO NOT EXCEED A TOTAL OF 3 DOSES IN 15 MINUTES 25 tablet 0   omeprazole (PRILOSEC) 20 MG capsule Take 1 capsule (20 mg total) by mouth daily. 90 capsule 3   ondansetron (ZOFRAN) 4 MG tablet Take 1 tablet (4 mg total) by mouth every 8 (eight) hours as needed for nausea or vomiting. 40 tablet 0   OneTouch Delica Lancets 53M MISC USE 1  TO CHECK GLUCOSE ONCE DAILY *APPOINTMENT  REQUIRED  FOR  FUTURE  REFILLS 100 each 3   ONETOUCH VERIO test strip Use to check blood sugar once daily  DX CODE E11.9 100 each 1   Rivaroxaban (XARELTO) 15 MG TABS tablet Take by mouth.     venlafaxine XR (EFFEXOR XR) 75 MG 24 hr capsule Take 1 capsule  (75 mg total) by mouth daily with breakfast. 90 capsule 1   nitroGLYCERIN (NITROSTAT) 0.4 MG SL  tablet Place under the tongue. (Patient not taking: Reported on 02/27/2022)     polyethylene glycol powder (GLYCOLAX/MIRALAX) 17 GM/SCOOP powder polyethylene glycol 3350 17 gram oral powder packet  Take 1 packet every day by oral route. (Patient not taking: Reported on 02/27/2022)     Rivaroxaban (XARELTO) 15 MG TABS tablet Take 1 tablet (15 mg total) by mouth daily. (Patient taking differently: Take 15 mg by mouth. Take 1 tablet every other day) 90 tablet 0   No facility-administered medications prior to visit.    Allergies  Allergen Reactions   Atorvastatin Other (See Comments)    "muscle Cramps"   Budesonide Other (See Comments)    "fatigue"   Gabapentin Other (See Comments)    "fatigue"   Metoclopramide Other (See Comments)    "mayalgia"   Rosuvastatin Other (See Comments)    "muscle cramps"   Simvastatin Other (See Comments)    "muscle Cramps"   Wellbutrin [Bupropion]     Gi upset.    Sulfamethoxazole-Trimethoprim Rash    Review of Systems  Constitutional:  Positive for fever (100.6 yesterday). Negative for chills and fatigue.  HENT:  Negative for congestion, ear pain and sore throat.   Respiratory:  Positive for cough (chronic). Negative for shortness of breath.   Cardiovascular:  Negative for chest pain.  Gastrointestinal:  Negative for abdominal pain, constipation, diarrhea, nausea and vomiting.  Genitourinary:  Negative for dysuria and urgency.  Musculoskeletal:  Negative for arthralgias and myalgias.  Skin:  Negative for rash.  Neurological:  Negative for dizziness and headaches.  Psychiatric/Behavioral:  Negative for dysphoric mood. The patient is not nervous/anxious.        Objective:    Physical Exam Vitals reviewed.  Constitutional:      Appearance: Normal appearance. She is normal weight.  Neurological:     Mental Status: She is alert.     BP 136/68    Pulse 88   Temp (!) 96.8 F (36 C)   Ht 4' 11"  (1.499 m)   Wt 99 lb (44.9 kg)   SpO2 96%   BMI 20.00 kg/m  Wt Readings from Last 3 Encounters:  03/07/22 99 lb (44.9 kg)  02/27/22 105 lb 9.6 oz (47.9 kg)  12/19/21 103 lb (46.7 kg)    Health Maintenance Due  Topic Date Due   Hepatitis C Screening  Never done   Zoster Vaccines- Shingrix (1 of 2) Never done   COVID-19 Vaccine (5 - Booster for Pfizer series) 02/28/2021   OPHTHALMOLOGY EXAM  01/12/2022    There are no preventive care reminders to display for this patient.   Lab Results  Component Value Date   TSH 2.040 07/22/2021   Lab Results  Component Value Date   WBC 7.4 12/19/2021   HGB 11.4 12/19/2021   HCT 34.9 12/19/2021   MCV 88 12/19/2021   PLT 214 12/19/2021   Lab Results  Component Value Date   NA 142 12/19/2021   K 4.2 12/19/2021   CO2 22 12/19/2021   GLUCOSE 121 (H) 12/19/2021   BUN 29 (H) 12/19/2021   CREATININE 1.28 (H) 12/19/2021   BILITOT 0.4 12/19/2021   ALKPHOS 128 (H) 12/19/2021   AST 20 12/19/2021   ALT 12 12/19/2021   PROT 6.2 12/19/2021   ALBUMIN 3.9 12/19/2021   CALCIUM 9.5 12/19/2021   EGFR 43 (L) 12/19/2021   GFR 32.91 (L) 03/23/2020   Lab Results  Component Value Date   CHOL 232 (H) 12/19/2021   Lab Results  Component Value Date   HDL 71 12/19/2021   Lab Results  Component Value Date   LDLCALC 130 (H) 12/19/2021   Lab Results  Component Value Date   TRIG 178 (H) 12/19/2021   Lab Results  Component Value Date   CHOLHDL 3.3 12/19/2021   Lab Results  Component Value Date   HGBA1C 6.6 (A) 10/26/2021       Assessment & Plan:   Problem List Items Addressed This Visit   None  No orders of the defined types were placed in this encounter.   No orders of the defined types were placed in this encounter.    Follow-up: No follow-ups on file.  An After Visit Summary was printed and given to the patient.  [name], MD [facility] [phone]

## 2022-03-08 ENCOUNTER — Encounter: Payer: Self-pay | Admitting: Family Medicine

## 2022-03-08 ENCOUNTER — Telehealth: Payer: Self-pay

## 2022-03-08 LAB — COMPREHENSIVE METABOLIC PANEL
ALT: 17 IU/L (ref 0–32)
AST: 20 IU/L (ref 0–40)
Albumin/Globulin Ratio: 1.4 (ref 1.2–2.2)
Albumin: 3.7 g/dL — ABNORMAL LOW (ref 3.8–4.8)
Alkaline Phosphatase: 143 IU/L — ABNORMAL HIGH (ref 44–121)
BUN/Creatinine Ratio: 20 (ref 12–28)
BUN: 26 mg/dL (ref 8–27)
Bilirubin Total: 0.6 mg/dL (ref 0.0–1.2)
CO2: 25 mmol/L (ref 20–29)
Calcium: 8.8 mg/dL (ref 8.7–10.3)
Chloride: 93 mmol/L — ABNORMAL LOW (ref 96–106)
Creatinine, Ser: 1.28 mg/dL — ABNORMAL HIGH (ref 0.57–1.00)
Globulin, Total: 2.6 g/dL (ref 1.5–4.5)
Glucose: 159 mg/dL — ABNORMAL HIGH (ref 70–99)
Potassium: 4 mmol/L (ref 3.5–5.2)
Sodium: 135 mmol/L (ref 134–144)
Total Protein: 6.3 g/dL (ref 6.0–8.5)
eGFR: 43 mL/min/{1.73_m2} — ABNORMAL LOW (ref >59)

## 2022-03-08 LAB — CBC WITH DIFFERENTIAL/PLATELET
Basophils Absolute: 0 10*3/uL (ref 0.0–0.2)
Basos: 0 %
EOS (ABSOLUTE): 0.1 10*3/uL (ref 0.0–0.4)
Eos: 1 %
Hematocrit: 34.1 % (ref 34.0–46.6)
Hemoglobin: 11.1 g/dL (ref 11.1–15.9)
Immature Grans (Abs): 0 10*3/uL (ref 0.0–0.1)
Immature Granulocytes: 0 %
Lymphocytes Absolute: 1.6 10*3/uL (ref 0.7–3.1)
Lymphs: 15 %
MCH: 28.6 pg (ref 26.6–33.0)
MCHC: 32.6 g/dL (ref 31.5–35.7)
MCV: 88 fL (ref 79–97)
Monocytes Absolute: 0.9 10*3/uL (ref 0.1–0.9)
Monocytes: 9 %
Neutrophils Absolute: 8 10*3/uL — ABNORMAL HIGH (ref 1.4–7.0)
Neutrophils: 75 %
Platelets: 220 10*3/uL (ref 150–450)
RBC: 3.88 x10E6/uL (ref 3.77–5.28)
RDW: 12.8 % (ref 11.7–15.4)
WBC: 10.5 10*3/uL (ref 3.4–10.8)

## 2022-03-08 LAB — URIC ACID: Uric Acid: 10.7 mg/dL — ABNORMAL HIGH (ref 3.1–7.9)

## 2022-03-08 NOTE — Telephone Encounter (Signed)
Patient husband came into office due to pharmacy unwilling to give medication, colchicine.   [facility] due to lovastatin and carvedilol interaction. Spoke with Kennon Rounds, [place] who approves this to be dispensed for patient. Approval given to [facility]. Husband made aware.  Lorita Officer, CCMA 03/08/22 2:30 PM

## 2022-03-08 NOTE — Assessment & Plan Note (Addendum)
Gout versus skin infection. Colchicine 0.6 mg twice daily with food for 1 week for gout. Keflex 500 mg twice daily for for 1 week for possible cellulitis. Elevate.  May ice.  May use Tylenol. Follow-up in 48 hours with Bridget Hailstone, NP  03/08/2022 Patient did not end up starting colchicine until 7/26 due to concern about drug interaction from pharmacy.  I recommended she start colchicine. Hold statin. May continue coreg.  Wbc came back normal and uric acid came back high. Patient instructed to stop keflex. Keep follow up with Bridget Hailstone, NP.  Unable to take nsaids due ckd and to NOAC.  Consider steroid injection. Diabetes is well controlled.

## 2022-03-08 NOTE — Progress Notes (Signed)
Subjective:  Patient ID: Bridget [name], female    DOB: 1940/11/23  Age: 81 y.o. MRN: [id number]  Chief Complaint  Patient presents with   RIght foot pain    2 day follow up per Dr. Tobie Poet.    HPI  Pt presents for follow-up of right foot pain. Uric acid level elevated 10.7 on 03/07/22.Treatment has included Colchicine. Pt states right foot remains painful and swollen.     Current Outpatient Medications on File Prior to Visit  Medication Sig Dispense Refill   albuterol (VENTOLIN HFA) 108 (90 Base) MCG/ACT inhaler INHALE 1 TO 2 PUFFS BY MOUTH EVERY 4 HOURS AS NEEDED FOR WHEEZING 9 g 0   aspirin 81 MG chewable tablet Chew 81 mg by mouth in the morning.      Blood Glucose Monitoring Suppl (ONETOUCH VERIO FLEX SYSTEM) w/Device KIT Check sugar once daily 1 kit 0   Budeson-Glycopyrrol-Formoterol (BREZTRI AEROSPHERE) 160-9-4.8 MCG/ACT AERO Inhale 2 puffs into the lungs 2 (two) times daily. 32.1 g 3   calcium-vitamin D (OSCAL WITH D) 500-200 MG-UNIT TABS tablet Take 1 tablet by mouth daily.     carvedilol (COREG) 12.5 MG tablet Take 12.5 mg by mouth 2 (two) times daily with a meal.     cephALEXin (KEFLEX) 500 MG capsule Take 1 capsule (500 mg total) by mouth 2 (two) times daily. 14 capsule 0   Cholecalciferol (VITAMIN D-3) 1000 UNITS CAPS Take 1,000 Units by mouth daily.     colchicine 0.6 MG tablet Take 1 tablet (0.6 mg total) by mouth 2 (two) times daily. 14 tablet 0   furosemide (LASIX) 40 MG tablet Take 40 mg by mouth daily.     losartan (COZAAR) 25 MG tablet Take 25 mg by mouth daily.     lovastatin (MEVACOR) 40 MG tablet Take 1 tablet (40 mg total) by mouth at bedtime. 90 tablet 1   Magnesium 250 MG TABS Take 250 mg by mouth daily.     metFORMIN (GLUCOPHAGE) 500 MG tablet TAKE 1 TABLET BY MOUTH TWICE DAILY WITH MORNING MEAL AND WITH EVENING MEAL 180 tablet 0   Multiple Vitamins-Minerals (CENTRUM SILVER ULTRA WOMENS PO) Take by mouth daily.     nitroGLYCERIN (NITROSTAT) 0.4 MG SL  tablet DISSOLVE ONE TABLET UNDER THE TONGUE EVERY 5 MINUTES AS NEEDED FOR CHEST PAIN.  DO NOT EXCEED A TOTAL OF 3 DOSES IN 15 MINUTES 25 tablet 0   omeprazole (PRILOSEC) 20 MG capsule Take 1 capsule (20 mg total) by mouth daily. 90 capsule 3   ondansetron (ZOFRAN) 4 MG tablet Take 1 tablet (4 mg total) by mouth every 8 (eight) hours as needed for nausea or vomiting. 40 tablet 0   OneTouch Delica Lancets 52W MISC USE 1  TO CHECK GLUCOSE ONCE DAILY *APPOINTMENT  REQUIRED  FOR  FUTURE  REFILLS 100 each 3   ONETOUCH VERIO test strip Use to check blood sugar once daily  DX CODE E11.9 100 each 1   polyethylene glycol powder (GLYCOLAX/MIRALAX) 17 GM/SCOOP powder polyethylene glycol 3350 17 gram oral powder packet  Take 1 packet every day by oral route. (Patient not taking: Reported on 02/27/2022)     Rivaroxaban (XARELTO) 15 MG TABS tablet Take by mouth.     venlafaxine XR (EFFEXOR XR) 75 MG 24 hr capsule Take 1 capsule (75 mg total) by mouth daily with breakfast. 90 capsule 1   No current facility-administered medications on file prior to visit.   Past Medical History:  Diagnosis Date  Anemia    Anxiety and depression    Arthritis    HANDS,ARMS,FEET   Asthma    Atrioventricular block    CAD (coronary artery disease)    CHF (congestive heart failure) (HCC)    Chronic renal insufficiency    Clotting disorder (HCC)    COPD (chronic obstructive pulmonary disease) (HCC)    Diabetes mellitus without complication (HCC)    Family history of colon cancer    Fibromyalgia    GERD (gastroesophageal reflux disease)    Gout    Heart disease    Heart murmur    History of colon polyps    Hypertension    Insomnia    Kidney disease    Kidney stones 01/12/1983   Major depression    Major depressive disorder, recurrent, mild (HCC)    Migraines    Mixed hyperlipidemia    Osteoporosis 01/27/2019   Other amnesia    Rosacea    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 Otherwise normal colonoscopy   ESOPHAGOGASTRODUODENOSCOPY  11/16/2005   [place]. Patchy areas of mucosal erythema in the antrum compatible with gastritis. Polyps in the stomach. Nodule in the antrum. Medium hiatal hernia   pacemaker  09/27/2016   PACEMAKER REVISION  09/27/2020   PACEMAKER UPGRADE DUAL CHAMBER TO BIV    Family History  Problem Relation Age of Onset   High blood pressure Mother    Diabetes Mother        no medication needed   Kidney disease Mother    Pneumonia Mother    Colon polyps Father    High blood pressure Father    Colon cancer Father    Cancer Maternal Grandmother        Leukemia   Cancer Other        Ovarian   Rectal cancer Neg Hx    Stomach cancer Neg Hx    Esophageal cancer Neg Hx    Liver cancer Neg Hx    Pancreatic cancer Neg Hx    Crohn's disease Neg Hx    Social History   Socioeconomic History   Marital status: Married    Spouse name: Darryl   Number of children: 3   Years of education: Not on file   Highest education level: 9th grade  Occupational History   Occupation: Retired  Tobacco Use   Smoking status: Never    Passive exposure: Past   Smokeless tobacco: Never  Vaping Use   Vaping Use: Never used  Substance and Sexual Activity   Alcohol use: Never   Drug use: Never   Sexual activity: Not on file  Other Topics Concern   Not on file  Social History Narrative   Lives at home with her husband   Right handed   Caffeine: mostly tea, 0-2 cups a day   Social Determinants of Health   Financial Resource Strain: High Risk (10/19/2021)   Overall Financial Resource Strain (CARDIA)    Difficulty  of Paying Living Expenses: Very hard  Food Insecurity: Not on file  Transportation Needs: No Transportation Needs (10/19/2021)  PRAPARE - Administrator, Civil Service (Medical): No    Lack of Transportation (Non-Medical): No  Physical Activity: Not on file  Stress: Not on file  Social Connections: Not on file    Review of Systems  Constitutional:  Negative for appetite change, fatigue and fever.  HENT:  Negative for congestion, ear pain, sinus pressure and sore throat.   Respiratory:  Negative for cough, chest tightness, shortness of breath and wheezing.   Cardiovascular:  Negative for chest pain and palpitations.  Gastrointestinal:  Negative for abdominal pain, constipation, diarrhea, nausea and vomiting.  Genitourinary:  Negative for dysuria and hematuria.  Musculoskeletal:  Positive for arthralgias (right foot) and joint swelling (Right foot with bright redness.). Negative for back pain and myalgias.  Skin:  Negative for rash.  Neurological:  Negative for dizziness, weakness and headaches.  Psychiatric/Behavioral:  Negative for dysphoric mood. The patient is not nervous/anxious.      Objective:  BP 108/62 (BP Location: Left Arm, Patient Position: Sitting)   Pulse 81   Temp (!) 97.1 F (36.2 C) (Oral)   Ht 4\' 11"  (1.499 m)   Wt 100 lb 12.8 oz (45.7 kg)   SpO2 96%   BMI 20.36 kg/m      03/09/2022    8:41 AM 03/07/2022    3:23 PM 02/27/2022    9:14 AM  BP/Weight  Systolic BP 108 136   Diastolic BP 62 68   Wt. (Lbs) 100.8 99 105.6  BMI 20.36 kg/m2 20 kg/m2 21.33 kg/m2    Physical Exam Vitals reviewed.  Constitutional:      Appearance: Normal appearance.  Cardiovascular:     Rate and Rhythm: Normal rate and regular rhythm.     Pulses: Normal pulses.          Dorsalis pedis pulses are 2+ on the right side and 2+ on the left side.       Posterior tibial pulses are 2+ on the right side and 2+ on the left side.     Heart sounds: Normal heart sounds.   Pulmonary:     Effort: Pulmonary effort is normal.     Breath sounds: Normal breath sounds.  Abdominal:     General: Bowel sounds are normal.     Palpations: Abdomen is soft.  Musculoskeletal:        General: Swelling (right foot) and tenderness (right foot) present.  Feet:     Right foot:     Skin integrity: Erythema and warmth present.     Left foot:     Skin integrity: Skin integrity normal.  Neurological:     Mental Status: She is alert and oriented to person, place, and time.  Psychiatric:        Mood and Affect: Mood normal.        Behavior: Behavior normal.         Lab Results  Component Value Date   WBC 10.5 03/07/2022   HGB 11.1 03/07/2022   HCT 34.1 03/07/2022   PLT 220 03/07/2022   GLUCOSE 159 (H) 03/07/2022   CHOL 232 (H) 12/19/2021   TRIG 178 (H) 12/19/2021   HDL 71 12/19/2021   LDLDIRECT 94.2 12/05/2013   LDLCALC 130 (H) 12/19/2021   ALT 17 03/07/2022   AST 20 03/07/2022   NA 135 03/07/2022   K 4.0 03/07/2022   CL 93 (L) 03/07/2022   CREATININE 1.28 (H) 03/07/2022   BUN 26 03/07/2022   CO2 25 03/07/2022  TSH 2.040 07/22/2021   INR 1.3 (H) 11/12/2020   HGBA1C 6.6 (A) 10/26/2021   MICROALBUR 4.0 (H) 03/25/2021      Assessment & Plan:    1. Acute gout of right foot, unspecified cause - predniSONE (DELTASONE) 20 MG tablet; Take 3 tablets (60 mg total) by mouth daily with breakfast for 3 days, THEN 2 tablets (40 mg total) daily with breakfast for 3 days, THEN 1 tablet (20 mg total) daily with breakfast for 3 days.  Dispense: 18 tablet; Refill: 0 - triamcinolone acetonide (KENALOG-40) injection 60 mg  2. Right foot pain -ice, elevate right foot -tylenol as directed  3. Acute gout due to renal impairment involving toe of right foot  4. Stage 3b chronic kidney disease (HCC)       Kenalog 60 mg injection given in office Take Prednisone (20 mg tablets)  taper as prescribed: 3 tablets once daily for three days, then 2 tablets once daily for  three days, then 1 tablet once daily for 3 days, then stop medication, take with food Continue to ice and elevate right foot as needed Take Tylenol as directed Follow-up as needed   Follow-up: PRN  An After Visit Summary was printed and given to the patient.  I, Rip Harbour, NP, have reviewed all documentation for this visit. The documentation on 04/02/22 for the exam, diagnosis, procedures, and orders are all accurate and complete.   I,Lauren M Auman,acting as a Education administrator for [employer], NP.,have documented all relevant documentation on the behalf of Rip Harbour, NP,as directed by  Rip Harbour, NP while in the presence of Rip Harbour, NP.   Signed, Rip Harbour, NP [place] [phone]

## 2022-03-09 ENCOUNTER — Ambulatory Visit (INDEPENDENT_AMBULATORY_CARE_PROVIDER_SITE_OTHER): Payer: Medicare Other | Admitting: Nurse Practitioner

## 2022-03-09 ENCOUNTER — Encounter: Payer: Self-pay | Admitting: Nurse Practitioner

## 2022-03-09 VITALS — BP 108/62 | HR 81 | Temp 97.1°F | Ht 59.0 in | Wt 100.8 lb

## 2022-03-09 DIAGNOSIS — N1832 Chronic kidney disease, stage 3b: Secondary | ICD-10-CM

## 2022-03-09 DIAGNOSIS — M10371 Gout due to renal impairment, right ankle and foot: Secondary | ICD-10-CM | POA: Diagnosis not present

## 2022-03-09 DIAGNOSIS — M109 Gout, unspecified: Secondary | ICD-10-CM

## 2022-03-09 DIAGNOSIS — M79671 Pain in right foot: Secondary | ICD-10-CM | POA: Diagnosis not present

## 2022-03-09 MED ORDER — PREDNISONE 20 MG PO TABS: ORAL_TABLET | ORAL | 0 refills | Status: AC

## 2022-03-09 MED ORDER — TRIAMCINOLONE ACETONIDE 40 MG/ML IJ SUSP
60.0000 mg | Freq: Once | INTRAMUSCULAR | Status: AC
  Administered 2022-03-09: 60 mg via INTRAMUSCULAR

## 2022-03-09 NOTE — Patient Instructions (Addendum)
Kenalog 60 mg injection given in office Take Prednisone (20 mg tablets)  taper as prescribed: 3 tablets once daily for three days, then 2 tablets once daily for three days, then 1 tablet once daily for 3 days, then stop medication, take with food Continue to ice and elevate right foot as needed Take Tylenol as directed Follow-up as needed    Gout  Gout is painful swelling of your joints. Gout is a type of arthritis. It is caused by having too much uric acid in your body. Uric acid is a chemical that is made when your body breaks down substances called purines. If your body has too much uric acid, sharp crystals can form and build up in your joints. This causes pain and swelling. Gout attacks can happen quickly and be very painful (acute gout). Over time, the attacks can affect more joints and happen more often (chronic gout). What are the causes? Gout is caused by too much uric acid in your blood. This can happen because: Your kidneys do not remove enough uric acid from your blood. Your body makes too much uric acid. You eat too many foods that are high in purines. These foods include organ meats, some seafood, and beer. Trauma or stress can bring on an attack. What increases the risk? Having a family history of gout. Being female and middle-aged. Being female and having gone through menopause. Having an organ transplant. Taking certain medicines. Having certain conditions, such as: Being very overweight (obese). Lead poisoning. Kidney disease. A skin condition called psoriasis. Other risks include: Losing weight too quickly. Not having enough water in the body (being dehydrated). Drinking alcohol, especially beer. Drinking beverages that are sweetened with a type of sugar called fructose. What are the signs or symptoms? An attack of acute gout often starts at night and usually happens in just one joint. The most common place is the big toe. Other joints that may be affected include  joints of the feet, ankle, knee, fingers, wrist, or elbow. Symptoms may include: Very bad pain. Warmth. Swelling. Stiffness. Tenderness. The affected joint may be very painful to touch. Shiny, red, or purple skin. Chills and fever. Chronic gout may cause symptoms more often. More joints may be involved. You may also have white or yellow lumps (tophi) on your hands or feet or in other areas near your joints. How is this treated? Treatment for an acute attack may include medicines for pain and swelling, such as: NSAIDs, such as ibuprofen. Steroids taken by mouth or injected into a joint. Colchicine. This can be given by mouth or through an IV tube. Treatment to prevent future attacks may include: Taking small doses of NSAIDs or colchicine daily. Using a medicine that reduces uric acid levels in your blood, such as allopurinol. Making changes to your diet. You may need to see a food expert (dietitian) about what to eat and drink to prevent gout. Follow these instructions at home: During a gout attack  If told, put ice on the painful area. To do this: Put ice in a plastic bag. Place a towel between your skin and the bag. Leave the ice on for 20 minutes, 2-3 times a day. Take off the ice if your skin turns bright red. This is very important. If you cannot feel pain, heat, or cold, you have a greater risk of damage to the area. Raise the painful joint above the level of your heart as often as you can. Rest the joint as much as  possible. If the joint is in your leg, you may be given crutches. Follow instructions from your doctor about what you cannot eat or drink. Avoiding future gout attacks Eat a low-purine diet. Avoid foods and drinks such as: Liver. Kidney. Anchovies. Asparagus. Herring. Mushrooms. Mussels. Beer. Stay at a healthy weight. If you want to lose weight, talk with your doctor. Do not lose weight too fast. Start or continue an exercise plan as told by your  doctor. Eating and drinking Avoid drinks sweetened by fructose. Drink enough fluids to keep your pee (urine) pale yellow. If you drink alcohol: Limit how much you have to: 0-1 drink a day for women who are not pregnant. 0-2 drinks a day for men. Know how much alcohol is in a drink. In the [place] one drink equals one 12 oz bottle of beer (355 mL), one 5 oz glass of wine (148 mL), or one 1 oz glass of hard liquor (44 mL). General instructions Take over-the-counter and prescription medicines only as told by your doctor. Ask your doctor if you should avoid driving or using machines while you are taking your medicine. Return to your normal activities when your doctor says that it is safe. Keep all follow-up visits. Where to find more information [employer]: www.niams.http://www.myers.net/ Contact a doctor if: You have another gout attack. You still have symptoms of a gout attack after 10 days of treatment. You have problems (side effects) because of your medicines. You have chills or a fever. You have burning pain when you pee (urinate). You have pain in your lower back or belly. Get help right away if: You have very bad pain. Your pain cannot be controlled. You cannot pee. Summary Gout is painful swelling of the joints. The most common site of pain is the big toe, but it can affect other joints. Medicines and avoiding some foods can help to prevent and treat gout attacks. This information is not intended to replace advice given to you by your health care provider. Make sure you discuss any questions you have with your health care provider. Document Revised: 05/04/2021 Document Reviewed: 05/04/2021 Elsevier Patient Education  2023 [employer].

## 2022-03-27 ENCOUNTER — Other Ambulatory Visit: Payer: Self-pay | Admitting: Family Medicine

## 2022-03-27 DIAGNOSIS — F330 Major depressive disorder, recurrent, mild: Secondary | ICD-10-CM

## 2022-03-30 ENCOUNTER — Ambulatory Visit (INDEPENDENT_AMBULATORY_CARE_PROVIDER_SITE_OTHER): Payer: Medicare Other | Admitting: Family Medicine

## 2022-03-30 ENCOUNTER — Encounter: Payer: Self-pay | Admitting: Family Medicine

## 2022-03-30 VITALS — BP 126/72 | HR 89 | Temp 97.7°F | Ht 59.0 in | Wt 99.0 lb

## 2022-03-30 DIAGNOSIS — Z86718 Personal history of other venous thrombosis and embolism: Secondary | ICD-10-CM | POA: Diagnosis not present

## 2022-03-30 DIAGNOSIS — E1121 Type 2 diabetes mellitus with diabetic nephropathy: Secondary | ICD-10-CM

## 2022-03-30 DIAGNOSIS — N1831 Chronic kidney disease, stage 3a: Secondary | ICD-10-CM | POA: Diagnosis not present

## 2022-03-30 DIAGNOSIS — Z1159 Encounter for screening for other viral diseases: Secondary | ICD-10-CM | POA: Insufficient documentation

## 2022-03-30 DIAGNOSIS — F330 Major depressive disorder, recurrent, mild: Secondary | ICD-10-CM

## 2022-03-30 DIAGNOSIS — J4530 Mild persistent asthma, uncomplicated: Secondary | ICD-10-CM | POA: Diagnosis not present

## 2022-03-30 DIAGNOSIS — N1832 Chronic kidney disease, stage 3b: Secondary | ICD-10-CM

## 2022-03-30 DIAGNOSIS — E1159 Type 2 diabetes mellitus with other circulatory complications: Secondary | ICD-10-CM

## 2022-03-30 DIAGNOSIS — M62838 Other muscle spasm: Secondary | ICD-10-CM

## 2022-03-30 DIAGNOSIS — D6869 Other thrombophilia: Secondary | ICD-10-CM | POA: Diagnosis not present

## 2022-03-30 DIAGNOSIS — I152 Hypertension secondary to endocrine disorders: Secondary | ICD-10-CM

## 2022-03-30 DIAGNOSIS — E782 Mixed hyperlipidemia: Secondary | ICD-10-CM | POA: Diagnosis not present

## 2022-03-30 MED ORDER — ALBUTEROL SULFATE HFA 108 (90 BASE) MCG/ACT IN AERS: INHALATION_SPRAY | RESPIRATORY_TRACT | 2 refills | Status: DC

## 2022-03-30 NOTE — Patient Instructions (Addendum)
Recommend shingrix vaccines at the pharmacy.  Restart pravastatin 40 mg daily.

## 2022-03-30 NOTE — Progress Notes (Signed)
Subjective:  Patient ID: Bridget [name], female    DOB: 1941/01/29  Age: 81 y.o. MRN: [id number]  Chief Complaint  Patient presents with   Diabetes   Hyperlipidemia   Hypertension   HPI: Diabetes:  Complications: hypertension Fasting sugars 141-200. Most recent A1C: 6.6. Current medications: Metformin 500 mg take 1 tablet twice daily. He was considering trying jardiance. Last Eye Exam: 01/12/2021. Foot checks: daily Dr. Dwyane Dee endocrinologist.   Hyperlipidemia: Current medications:Has been off Lovastatin 40 mg take 1 tablet daily, due to being on the gout medication she was placed on. She stated that she probably does need to be back on the medication but had been meaning to call back.  Hypertension: Current medications: Losartan 25 mg take 1 tablet by mouth daily and coreg 12.5 mg one twice daily.   Depression: Patient is currently taking Venlafaxine XR 75 mg take 1 capsule daily. Improved, but still has anhedonia. Tired a lot. No energy.     03/30/2022    8:22 AM 12/19/2021    9:22 AM 09/19/2021    1:33 PM  PHQ9 SCORE ONLY  PHQ-9 Total Score 8 11 4    GERD: on omeprazole 20 mg daily. Having dysphagia. Scheduled for EGD later this month with Dr. Lyndel Safe.   History of DVT: xarelto 15 mg once daily.   Mild Persistent Asthma: Patient is currently taking Breztri 2 puffs twice daily  Diet: good Exercise: minimal   Current Outpatient Medications on File Prior to Visit  Medication Sig Dispense Refill   aspirin 81 MG chewable tablet Chew 81 mg by mouth in the morning.      Blood Glucose Monitoring Suppl (ONETOUCH VERIO FLEX SYSTEM) w/Device KIT Check sugar once daily 1 kit 0   Budeson-Glycopyrrol-Formoterol (BREZTRI AEROSPHERE) 160-9-4.8 MCG/ACT AERO Inhale 2 puffs into the lungs 2 (two) times daily. 32.1 g 3   calcium-vitamin D (OSCAL WITH D) 500-200 MG-UNIT TABS tablet Take 1 tablet by mouth daily.     carvedilol (COREG) 12.5 MG tablet Take 12.5 mg by mouth 2 (two) times  daily with a meal.     Cholecalciferol (VITAMIN D-3) 1000 UNITS CAPS Take 1,000 Units by mouth daily.     colchicine 0.6 MG tablet Take 1 tablet (0.6 mg total) by mouth 2 (two) times daily. 14 tablet 0   furosemide (LASIX) 40 MG tablet Take 40 mg by mouth daily.     losartan (COZAAR) 25 MG tablet Take 25 mg by mouth daily.     Magnesium 250 MG TABS Take 250 mg by mouth daily.     metFORMIN (GLUCOPHAGE) 500 MG tablet TAKE 1 TABLET BY MOUTH TWICE DAILY WITH MORNING MEAL AND WITH EVENING MEAL 180 tablet 0   Multiple Vitamins-Minerals (CENTRUM SILVER ULTRA WOMENS PO) Take by mouth daily.     nitroGLYCERIN (NITROSTAT) 0.4 MG SL tablet DISSOLVE ONE TABLET UNDER THE TONGUE EVERY 5 MINUTES AS NEEDED FOR CHEST PAIN.  DO NOT EXCEED A TOTAL OF 3 DOSES IN 15 MINUTES 25 tablet 0   omeprazole (PRILOSEC) 20 MG capsule Take 1 capsule (20 mg total) by mouth daily. 90 capsule 3   ondansetron (ZOFRAN) 4 MG tablet Take 1 tablet (4 mg total) by mouth every 8 (eight) hours as needed for nausea or vomiting. 40 tablet 0   OneTouch Delica Lancets 44Q MISC USE 1  TO CHECK GLUCOSE ONCE DAILY *APPOINTMENT  REQUIRED  FOR  FUTURE  REFILLS 100 each 3   ONETOUCH VERIO test strip Use to check  blood sugar once daily  DX CODE E11.9 100 each 1   polyethylene glycol powder (GLYCOLAX/MIRALAX) 17 GM/SCOOP powder      Rivaroxaban (XARELTO) 15 MG TABS tablet Take by mouth.     venlafaxine XR (EFFEXOR-XR) 75 MG 24 hr capsule TAKE 1 CAPSULE BY MOUTH ONCE DAILY WITH BREAKFAST 90 capsule 0   lovastatin (MEVACOR) 40 MG tablet Take 1 tablet (40 mg total) by mouth at bedtime. (Patient not taking: Reported on 03/30/2022) 90 tablet 1   No current facility-administered medications on file prior to visit.   Past Medical History:  Diagnosis Date   Anemia    Anxiety and depression    Arthritis    HANDS,ARMS,FEET   Asthma    Atrioventricular block    CAD (coronary artery disease)    CHF (congestive heart failure) (HCC)    Chronic renal  insufficiency    Clotting disorder (HCC)    COPD (chronic obstructive pulmonary disease) (HCC)    Diabetes mellitus without complication ([place])    Family history of colon cancer    Fibromyalgia    GERD (gastroesophageal reflux disease)    Gout    Heart disease    Heart murmur    History of colon polyps    Hypertension    Insomnia    Kidney disease    Kidney stones 01/12/1983   Major depression    Major depressive disorder, recurrent, mild (HCC)    Migraines    Mixed hyperlipidemia    Osteoporosis 01/27/2019   Other amnesia    Rosacea    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 Otherwise normal colonoscopy   ESOPHAGOGASTRODUODENOSCOPY  11/16/2005   [place]. Patchy areas of mucosal erythema in the antrum compatible with gastritis. Polyps in the stomach. Nodule in the antrum. Medium hiatal hernia   pacemaker  09/27/2016   PACEMAKER REVISION  09/27/2020   PACEMAKER UPGRADE DUAL CHAMBER TO BIV    Family History  Problem Relation Age of Onset   High blood pressure Mother    Diabetes Mother        no medication needed   Kidney disease Mother    Pneumonia Mother    Colon polyps Father    High blood pressure Father    Colon cancer Father    Cancer Maternal Grandmother        Leukemia   Cancer Other        Ovarian   Rectal cancer Neg Hx    Stomach cancer Neg Hx    Esophageal cancer Neg Hx    Liver cancer Neg Hx    Pancreatic cancer Neg Hx    Crohn's disease Neg Hx    Social History   Socioeconomic History   Marital status: Married    Spouse name: Darryl   Number of children: 3   Years of education:  Not on file   Highest education level: 9th grade  Occupational History   Occupation: Retired  Tobacco Use   Smoking status: Never    Passive exposure: Past   Smokeless tobacco: Never  Vaping Use  Vaping Use: Never used  Substance and Sexual Activity   Alcohol use: Never   Drug use: Never   Sexual activity: Not on file  Other Topics Concern   Not on file  Social History Narrative   Lives at home with her husband   Right handed   Caffeine: mostly tea, 0-2 cups a day   Social Determinants of Health   Financial Resource Strain: High Risk (10/19/2021)   Overall Financial Resource Strain (CARDIA)    Difficulty of Paying Living Expenses: Very hard  Food Insecurity: Not on file  Transportation Needs: No Transportation Needs (10/19/2021)   PRAPARE - Hydrologist (Medical): No    Lack of Transportation (Non-Medical): No  Physical Activity: Not on file  Stress: Not on file  Social Connections: Not on file    Review of Systems  Constitutional:  Negative for appetite change, fatigue and fever.  HENT:  Negative for congestion, ear pain, sinus pressure and sore throat.   Respiratory:  Negative for cough, chest tightness, shortness of breath and wheezing.   Cardiovascular:  Negative for chest pain and palpitations.  Gastrointestinal:  Negative for abdominal pain, constipation, diarrhea, nausea and vomiting.  Genitourinary:  Negative for dysuria and hematuria.  Musculoskeletal:  Positive for back pain (Low back pain/ patient has been doing alot of heavy lifting in her Sewing room!). Negative for arthralgias, joint swelling and myalgias.  Skin:  Negative for rash.  Neurological:  Negative for dizziness, weakness and headaches.  Psychiatric/Behavioral:  Negative for dysphoric mood. The patient is not nervous/anxious.      Objective:  BP 126/72 (BP Location: Left Arm, Patient Position: Sitting)   Pulse 89   Temp 97.7 F (36.5 C) (Temporal)   Ht 4' 11"   (1.499 m)   Wt 99 lb (44.9 kg)   SpO2 98%   BMI 20.00 kg/m      03/30/2022    8:20 AM 03/09/2022    8:41 AM 03/07/2022    3:23 PM  BP/Weight  Systolic BP 588 502 774  Diastolic BP 72 62 68  Wt. (Lbs) 99 100.8 99  BMI 20 kg/m2 20.36 kg/m2 20 kg/m2    Physical Exam Vitals reviewed.  Constitutional:      Appearance: Normal appearance. She is normal weight.  Cardiovascular:     Rate and Rhythm: Normal rate and regular rhythm.     Pulses: Normal pulses.     Heart sounds: Normal heart sounds.  Pulmonary:     Effort: Pulmonary effort is normal.     Breath sounds: Normal breath sounds.  Abdominal:     General: Abdomen is flat. Bowel sounds are normal.     Palpations: Abdomen is soft.  Neurological:     Mental Status: She is alert and oriented to person, place, and time.  Psychiatric:        Mood and Affect: Mood normal.        Behavior: Behavior normal.     Diabetic Foot Exam - Simple   Simple Foot Form Diabetic Foot exam was performed with the following findings: Yes 03/30/2022  8:56 AM  Visual Inspection No deformities, no ulcerations, no other skin breakdown bilaterally: Yes Sensation Testing Intact to touch and monofilament testing bilaterally: Yes Pulse Check Posterior Tibialis and Dorsalis pulse intact bilaterally: Yes Comments      Lab Results  Component Value Date   WBC 6.6 03/30/2022   HGB 10.4 (L) 03/30/2022   HCT 32.0 (L) 03/30/2022  PLT 156 03/30/2022   GLUCOSE 129 (H) 03/30/2022   CHOL 216 (H) 03/30/2022   TRIG 153 (H) 03/30/2022   HDL 76 03/30/2022   LDLDIRECT 94.2 12/05/2013   LDLCALC 114 (H) 03/30/2022   ALT 17 03/30/2022   AST 19 03/30/2022   NA 138 03/30/2022   K 4.1 03/30/2022   CL 102 03/30/2022   CREATININE 1.23 (H) 03/30/2022   BUN 27 03/30/2022   CO2 22 03/30/2022   TSH 2.040 07/22/2021   INR 1.3 (H) 11/12/2020   HGBA1C 7.4 (H) 03/30/2022   MICROALBUR 4.0 (H) 03/25/2021      Assessment & Plan:   Problem List Items  Addressed This Visit       Cardiovascular and Mediastinum   Hypertension complicating diabetes ([place])    Control: good Recommend check sugars fasting daily. Recommend check feet daily. Recommend annual eye exams. Medicines:  Metformin 500 mg take 1 tablet twice daily. I agree that Vania Rea would be a good addition. Will defer to Dr. Dwyane Dee as patient wishes to see him first.  Continue to work on eating a healthy diet and exercise.  Labs drawn today.         Relevant Orders   Comprehensive metabolic panel (Completed)     Respiratory   Mild persistent asthma    Mild. no medicines at this time. Will prescribe albuterol HFA to use as needed.       Relevant Medications   albuterol (VENTOLIN HFA) 108 (90 Base) MCG/ACT inhaler     Endocrine   Diabetic glomerulopathy (HCC) - Primary    Control: good Recommend check sugars fasting daily. Recommend check feet daily. Recommend annual eye exams. Medicines: no changes. See above.  Continue to work on eating a healthy diet and exercise.  Labs drawn today.        Relevant Orders   Hemoglobin A1c (Completed)     Genitourinary   Stage 3b chronic kidney disease (HCC)    Stable.      RESOLVED: Stage 3a chronic kidney disease (HCC)    Stable.          Hematopoietic and Hemostatic   Acquired thrombophilia ([place])    Secondary to xarelto.      Relevant Orders   CBC With Diff/Platelet (Completed)     Other   Mixed hyperlipidemia    Elevated. Recommend restart lovastatin. Continue to work on eating a healthy diet and exercise.  Labs drawn today.        Relevant Orders   Lipid panel (Completed)   History of DVT (deep vein thrombosis)    Continue xarelto.       Muscle spasm    Check labs.  Intolerant to statins.       Relevant Orders   Magnesium (Completed)   Phosphorus (Completed)   Depression, major, recurrent, mild (HCC)    The current medical regimen is effective;  continue present plan and medications.  Continue  Venlafaxine XR 75 mg take 1 capsule daily.      Need for hepatitis C screening test   Relevant Orders   HCV Ab w Reflex to Quant PCR (Completed)  .  Meds ordered this encounter  Medications   albuterol (VENTOLIN HFA) 108 (90 Base) MCG/ACT inhaler    Sig: INHALE 1 TO 2 PUFFS BY MOUTH EVERY 4 HOURS AS NEEDED FOR WHEEZING    Dispense:  8 g    Refill:  2    Orders Placed This Encounter  Procedures  CBC With Diff/Platelet   Comprehensive metabolic panel   Lipid panel   Hemoglobin A1c   Magnesium   Phosphorus   HCV Ab w Reflex to Quant PCR     Follow-up: Return for awv due anytime (try to schedule her husband's on same day.), chronic fasting 6 months.  An After Visit Summary was printed and given to the patient.  Total time spent on today's visit was greater than 30 minutes, including both face-to-face time and nonface-to-face time personally spent on review of chart (labs and imaging), discussing labs and goals, discussing further work-up, treatment options, referrals to specialist if needed, reviewing outside records of pertinent, answering patient's questions, and coordinating care.    [name], MD [facility] [phone]

## 2022-03-31 LAB — CARDIOVASCULAR RISK ASSESSMENT

## 2022-03-31 LAB — CBC WITH DIFF/PLATELET
Basophils Absolute: 0 10*3/uL (ref 0.0–0.2)
Basos: 1 %
EOS (ABSOLUTE): 0.1 10*3/uL (ref 0.0–0.4)
Eos: 2 %
Hematocrit: 32 % — ABNORMAL LOW (ref 34.0–46.6)
Hemoglobin: 10.4 g/dL — ABNORMAL LOW (ref 11.1–15.9)
Immature Grans (Abs): 0.1 10*3/uL (ref 0.0–0.1)
Immature Granulocytes: 1 %
Lymphocytes Absolute: 1.2 10*3/uL (ref 0.7–3.1)
Lymphs: 18 %
MCH: 28.4 pg (ref 26.6–33.0)
MCHC: 32.5 g/dL (ref 31.5–35.7)
MCV: 87 fL (ref 79–97)
Monocytes Absolute: 0.4 10*3/uL (ref 0.1–0.9)
Monocytes: 6 %
Neutrophils Absolute: 4.8 10*3/uL (ref 1.4–7.0)
Neutrophils: 72 %
Platelets: 156 10*3/uL (ref 150–450)
RBC: 3.66 x10E6/uL — ABNORMAL LOW (ref 3.77–5.28)
RDW: 13.4 % (ref 11.7–15.4)
WBC: 6.6 10*3/uL (ref 3.4–10.8)

## 2022-03-31 LAB — LIPID PANEL
Chol/HDL Ratio: 2.8 ratio (ref 0.0–4.4)
Cholesterol, Total: 216 mg/dL — ABNORMAL HIGH (ref 100–199)
HDL: 76 mg/dL (ref >39)
LDL Chol Calc (NIH): 114 mg/dL — ABNORMAL HIGH (ref 0–99)
Triglycerides: 153 mg/dL — ABNORMAL HIGH (ref 0–149)
VLDL Cholesterol Cal: 26 mg/dL (ref 5–40)

## 2022-03-31 LAB — COMPREHENSIVE METABOLIC PANEL
ALT: 17 IU/L (ref 0–32)
AST: 19 IU/L (ref 0–40)
Albumin/Globulin Ratio: 1.5 (ref 1.2–2.2)
Albumin: 3.5 g/dL — ABNORMAL LOW (ref 3.8–4.8)
Alkaline Phosphatase: 127 IU/L — ABNORMAL HIGH (ref 44–121)
BUN/Creatinine Ratio: 22 (ref 12–28)
BUN: 27 mg/dL (ref 8–27)
Bilirubin Total: 0.4 mg/dL (ref 0.0–1.2)
CO2: 22 mmol/L (ref 20–29)
Calcium: 8.9 mg/dL (ref 8.7–10.3)
Chloride: 102 mmol/L (ref 96–106)
Creatinine, Ser: 1.23 mg/dL — ABNORMAL HIGH (ref 0.57–1.00)
Globulin, Total: 2.3 g/dL (ref 1.5–4.5)
Glucose: 129 mg/dL — ABNORMAL HIGH (ref 70–99)
Potassium: 4.1 mmol/L (ref 3.5–5.2)
Sodium: 138 mmol/L (ref 134–144)
Total Protein: 5.8 g/dL — ABNORMAL LOW (ref 6.0–8.5)
eGFR: 45 mL/min/{1.73_m2} — ABNORMAL LOW (ref >59)

## 2022-03-31 LAB — HEMOGLOBIN A1C
Est. average glucose Bld gHb Est-mCnc: 166 mg/dL
Hgb A1c MFr Bld: 7.4 % — ABNORMAL HIGH (ref 4.8–5.6)

## 2022-03-31 LAB — HCV INTERPRETATION

## 2022-03-31 LAB — MAGNESIUM: Magnesium: 1.6 mg/dL (ref 1.6–2.3)

## 2022-03-31 LAB — HCV AB W REFLEX TO QUANT PCR: HCV Ab: NONREACTIVE

## 2022-03-31 LAB — PHOSPHORUS: Phosphorus: 3.2 mg/dL (ref 3.0–4.3)

## 2022-03-31 NOTE — Progress Notes (Signed)
Blood count abnormal. Mild anemia.  Liver function normal.  Kidney function abnormal, but stable.  Cholesterol: LDL 114 improved a little. Trigs improved. Patient held lovastatin. If this has not a difference, I would consider restarting it. If her muscle pain has improved, put as adverse reaction. Recommend zetia 10 mg daily.  HBA1C: 7.4. defer management to endocrinology.  Hepatitis c negative.  Phosphorus normal. Magnesium 1.6. low normal.recommend magnesium to 250 mg 2 daily.

## 2022-04-02 NOTE — Assessment & Plan Note (Signed)
Elevated. Recommend restart lovastatin. Continue to work on eating a healthy diet and exercise.  Labs drawn today.

## 2022-04-02 NOTE — Assessment & Plan Note (Signed)
Secondary to xarelto.   

## 2022-04-02 NOTE — Assessment & Plan Note (Signed)
>>  ASSESSMENT AND PLAN FOR DEPRESSION, MAJOR, RECURRENT, MILD (HCC) WRITTEN ON 04/02/2022  1:35 PM BY Zafar Debrosse, MD  The current medical regimen is effective;  continue present plan and medications. Continue  Venlafaxine XR 75 mg take 1 capsule daily.

## 2022-04-02 NOTE — Assessment & Plan Note (Signed)
Continue xarelto

## 2022-04-02 NOTE — Assessment & Plan Note (Addendum)
The current medical regimen is effective;  continue present plan and medications. Continue  Venlafaxine XR 75 mg take 1 capsule daily.

## 2022-04-02 NOTE — Assessment & Plan Note (Signed)
Stable

## 2022-04-02 NOTE — Assessment & Plan Note (Signed)
Check labs.  Intolerant to statins.

## 2022-04-02 NOTE — Assessment & Plan Note (Signed)
Control: good Recommend check sugars fasting daily. Recommend check feet daily. Recommend annual eye exams. Medicines:  Metformin 500 mg take 1 tablet twice daily. I agree that London Pepper would be a good addition. Will defer to Dr. Lucianne Muss as patient wishes to see him first.  Continue to work on eating a healthy diet and exercise.  Labs drawn today.

## 2022-04-02 NOTE — Assessment & Plan Note (Addendum)
Mild. no medicines at this time. Will prescribe albuterol HFA to use as needed.

## 2022-04-02 NOTE — Assessment & Plan Note (Signed)
Control: good Recommend check sugars fasting daily. Recommend check feet daily. Recommend annual eye exams. Medicines: no changes. See above.  Continue to work on eating a healthy diet and exercise.  Labs drawn today.

## 2022-04-03 ENCOUNTER — Other Ambulatory Visit: Payer: Self-pay

## 2022-04-03 MED ORDER — MAGNESIUM 250 MG PO TABS: 250.0000 mg | ORAL_TABLET | Freq: Two times a day (BID) | ORAL | 0 refills | Status: DC

## 2022-04-06 ENCOUNTER — Encounter (HOSPITAL_COMMUNITY): Payer: Self-pay | Admitting: Gastroenterology

## 2022-04-08 DIAGNOSIS — Z45018 Encounter for adjustment and management of other part of cardiac pacemaker: Secondary | ICD-10-CM | POA: Diagnosis not present

## 2022-04-13 ENCOUNTER — Ambulatory Visit (HOSPITAL_COMMUNITY): Payer: Medicare Other | Admitting: Anesthesiology

## 2022-04-13 ENCOUNTER — Encounter (HOSPITAL_COMMUNITY)
Admission: RE | Disposition: A | Discharge status: Home / Self Care | Payer: Self-pay | Source: Ambulatory Visit | Attending: Gastroenterology

## 2022-04-13 ENCOUNTER — Ambulatory Visit (HOSPITAL_BASED_OUTPATIENT_CLINIC_OR_DEPARTMENT_OTHER): Payer: Medicare Other | Admitting: Anesthesiology

## 2022-04-13 ENCOUNTER — Encounter (HOSPITAL_COMMUNITY): Payer: Self-pay | Admitting: Gastroenterology

## 2022-04-13 ENCOUNTER — Other Ambulatory Visit: Payer: Self-pay

## 2022-04-13 ENCOUNTER — Ambulatory Visit (HOSPITAL_COMMUNITY)
Admission: RE | Admit: 2022-04-13 | Discharge: 2022-04-13 | Disposition: A | Discharge status: Home / Self Care | Payer: Medicare Other | Source: Ambulatory Visit | Attending: Gastroenterology | Admitting: Gastroenterology

## 2022-04-13 DIAGNOSIS — K224 Dyskinesia of esophagus: Secondary | ICD-10-CM | POA: Insufficient documentation

## 2022-04-13 DIAGNOSIS — Q399 Congenital malformation of esophagus, unspecified: Secondary | ICD-10-CM

## 2022-04-13 DIAGNOSIS — E119 Type 2 diabetes mellitus without complications: Secondary | ICD-10-CM | POA: Insufficient documentation

## 2022-04-13 DIAGNOSIS — I10 Essential (primary) hypertension: Secondary | ICD-10-CM

## 2022-04-13 DIAGNOSIS — R1319 Other dysphagia: Secondary | ICD-10-CM

## 2022-04-13 DIAGNOSIS — K317 Polyp of stomach and duodenum: Secondary | ICD-10-CM

## 2022-04-13 DIAGNOSIS — K449 Diaphragmatic hernia without obstruction or gangrene: Secondary | ICD-10-CM | POA: Insufficient documentation

## 2022-04-13 DIAGNOSIS — R1310 Dysphagia, unspecified: Secondary | ICD-10-CM | POA: Insufficient documentation

## 2022-04-13 DIAGNOSIS — Z7984 Long term (current) use of oral hypoglycemic drugs: Secondary | ICD-10-CM

## 2022-04-13 DIAGNOSIS — K2289 Other specified disease of esophagus: Secondary | ICD-10-CM | POA: Diagnosis not present

## 2022-04-13 DIAGNOSIS — J449 Chronic obstructive pulmonary disease, unspecified: Secondary | ICD-10-CM | POA: Diagnosis not present

## 2022-04-13 DIAGNOSIS — K219 Gastro-esophageal reflux disease without esophagitis: Secondary | ICD-10-CM | POA: Diagnosis not present

## 2022-04-13 HISTORY — DX: Other complications of anesthesia, initial encounter: T88.59XA

## 2022-04-13 HISTORY — PX: ESOPHAGOGASTRODUODENOSCOPY (EGD) WITH PROPOFOL: SHX5813

## 2022-04-13 HISTORY — PX: MALONEY DILATION: SHX5535

## 2022-04-13 HISTORY — PX: BIOPSY: SHX5522

## 2022-04-13 LAB — GLUCOSE, CAPILLARY: Glucose-Capillary: 129 mg/dL — ABNORMAL HIGH (ref 70–99)

## 2022-04-13 SURGERY — ESOPHAGOGASTRODUODENOSCOPY (EGD) WITH PROPOFOL
Anesthesia: Monitor Anesthesia Care

## 2022-04-13 MED ORDER — SODIUM CHLORIDE 0.9 % IV SOLN: INTRAVENOUS | Status: DC

## 2022-04-13 MED ORDER — LACTATED RINGERS IV SOLN: INTRAVENOUS | Status: DC

## 2022-04-13 MED ORDER — PROPOFOL 500 MG/50ML IV EMUL
INTRAVENOUS | Status: AC
  Filled 2022-04-13: qty 50

## 2022-04-13 MED ORDER — PROPOFOL 10 MG/ML IV BOLUS
INTRAVENOUS | Status: DC | PRN
  Administered 2022-04-13: 10 mg via INTRAVENOUS
  Administered 2022-04-13: 20 mg via INTRAVENOUS
  Administered 2022-04-13: 10 mg via INTRAVENOUS

## 2022-04-13 MED ORDER — PROPOFOL 500 MG/50ML IV EMUL
INTRAVENOUS | Status: DC | PRN
  Administered 2022-04-13: 100 ug/kg/min via INTRAVENOUS

## 2022-04-13 SURGICAL SUPPLY — 15 items

## 2022-04-13 NOTE — Anesthesia Preprocedure Evaluation (Signed)
Anesthesia Evaluation  Patient identified by MRN, date of birth, ID band Patient awake    Reviewed: Allergy & Precautions, NPO status , Patient's Chart, lab work & pertinent test results  Airway Mallampati: I       Dental no notable dental hx.    Pulmonary asthma , COPD,    Pulmonary exam normal        Cardiovascular hypertension, Pt. on medications and Pt. on home beta blockers Normal cardiovascular exam     Neuro/Psych    GI/Hepatic GERD  Medicated and Controlled,  Endo/Other  diabetes, Type 2, Oral Hypoglycemic Agents  Renal/GU   negative genitourinary   Musculoskeletal   Abdominal Normal abdominal exam  (+)   Peds  Hematology   Anesthesia Other Findings   Reproductive/Obstetrics                             Anesthesia Physical Anesthesia Plan  ASA: 3  Anesthesia Plan: MAC   Post-op Pain Management:    Induction: Intravenous  PONV Risk Score and Plan: 2 and Propofol infusion, TIVA and Treatment may vary due to age or medical condition  Airway Management Planned: Natural Airway and Mask  Additional Equipment: None  Intra-op Plan:   Post-operative Plan:   Informed Consent: I have reviewed the patients History and Physical, chart, labs and discussed the procedure including the risks, benefits and alternatives for the proposed anesthesia with the patient or authorized representative who has indicated his/her understanding and acceptance.     Dental advisory given  Plan Discussed with: CRNA  Anesthesia Plan Comments:         Anesthesia Quick Evaluation

## 2022-04-13 NOTE — Anesthesia Postprocedure Evaluation (Signed)
Anesthesia Post Note  Patient: Bridget [name]  Procedure(s) Performed: ESOPHAGOGASTRODUODENOSCOPY (EGD) WITH PROPOFOL [place]     Patient location during evaluation: Phase II Anesthesia Type: MAC Level of consciousness: awake Pain management: pain level controlled Vital Signs Assessment: post-procedure vital signs reviewed and stable Respiratory status: spontaneous breathing Cardiovascular status: stable Postop Assessment: no apparent nausea or vomiting Anesthetic complications: no   No notable events documented.  Last Vitals:  Vitals:   04/13/22 1120 04/13/22 1130  BP: (!) 140/61 (!) 141/79  Pulse: 83 84  Resp: 20 15  Temp:    SpO2: 95% 96%    Last Pain:  Vitals:   04/13/22 1130  TempSrc:   PainSc: 0-No pain                 Huston Foley

## 2022-04-13 NOTE — Discharge Instructions (Addendum)
YOU MAY RESUME YOUR XARELTO: Tomorrow 04/14/22   YOU HAD AN ENDOSCOPIC PROCEDURE TODAY: Refer to the procedure report and other information in the discharge instructions given to you for any specific questions about what was found during the examination. If this information does not answer your questions, please call [place] office at [phone] to clarify.   YOU SHOULD EXPECT: Some feelings of bloating in the abdomen. Passage of more gas than usual. Walking can help get rid of the air that was put into your GI tract during the procedure and reduce the bloating. If you had a lower endoscopy (such as a colonoscopy or flexible sigmoidoscopy) you may notice spotting of blood in your stool or on the toilet paper. Some abdominal soreness may be present for a day or two, also.  DIET: Your first meal following the procedure should be a light meal and then it is ok to progress to your normal diet. A half-sandwich or bowl of soup is an example of a good first meal. Heavy or fried foods are harder to digest and may make you feel nauseous or bloated. Drink plenty of fluids but you should avoid alcoholic beverages for 24 hours. If you had a esophageal dilation, please see attached instructions for diet.    ACTIVITY: Your care partner should take you home directly after the procedure. You should plan to take it easy, moving slowly for the rest of the day. You can resume normal activity the day after the procedure however YOU SHOULD NOT DRIVE, use power tools, machinery or perform tasks that involve climbing or major physical exertion for 24 hours (because of the sedation medicines used during the test).   SYMPTOMS TO REPORT IMMEDIATELY: A gastroenterologist can be reached at any hour. Please call [phone]  for any of the following symptoms:    Following upper endoscopy (EGD, EUS, ERCP, esophageal dilation) Vomiting of blood or coffee ground material  New, significant abdominal pain  New, significant chest  pain or pain under the shoulder blades  Painful or persistently difficult swallowing  New shortness of breath  Black, tarry-looking or red, bloody stools  FOLLOW UP:  If any biopsies were taken you will be contacted by phone or by letter within the next 1-3 weeks. Call [phone]  if you have not heard about the biopsies in 3 weeks.  Please also call with any specific questions about appointments or follow up tests.

## 2022-04-13 NOTE — Transfer of Care (Signed)
Immediate Anesthesia Transfer of Care Note  Patient: Bridget [name]  Procedure(s) Performed: ESOPHAGOGASTRODUODENOSCOPY (EGD) WITH PROPOFOL [place]  Patient Location: PACU  Anesthesia Type:MAC  Level of Consciousness: sedated  Airway & Oxygen Therapy: Patient Spontanous Breathing and Patient connected to face mask oxygen  Post-op Assessment: Report given to RN and Post -op Vital signs reviewed and stable  Post vital signs: Reviewed and stable  Last Vitals:  Vitals Value Taken Time  BP 136/74 04/13/22 1105  Temp    Pulse 86 04/13/22 1106  Resp 27 04/13/22 1106  SpO2 100 % 04/13/22 1106  Vitals shown include unvalidated device data.  Last Pain:  Vitals:   04/13/22 1008  TempSrc: Temporal  PainSc: 0-No pain         Complications: No notable events documented.

## 2022-04-13 NOTE — H&P (Signed)
Chief Complaint: GI problems   Referring Provider:  [name], MD        ASSESSMENT AND PLAN;    #1. IBS-C with occ rectal bleed. Neg colon 04/2016   #2. Eso dysphagia   Plan: -Ba swallow with tab -Omeprazole 11m po qd #30 -Miralax 17g po QD -Continue Preparation H for now.  If still with problems, will give her hydrocortisone 2.5% cream. -FU in 12 weeks.  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  Mild esophageal dysmotility.  She would like to get EGD done.  explained risks and benefits.   HPI:     Bridget Sawchukis a 81y.o. female  C/O A Fib On Xeralto, complete heart block s/p pacemaker, DM2, CKD, HTN,    C/O longstanding constipation with abdominal bloating, previously diagnosed as IBS-C.  Bms 1-2/week Blood when she strains especially with the stool is hard.  Mostly bright red in color, away from the stool.  Better with Preparation H. Hb 12, 3 months ago No abdominal pain     Also having intermittent solid food dysphagia-with food getting hung up in the lower neck area.  Has associated heartburn.  Better with omeprazole 20 mg p.o. once a day.  Was quite concerned about the symptoms.  No odynophagia.   Has been undergoing extensive cardiac work-up.  That has been stressful.  Has lost approximately 10 pounds over last 1 year.   No fever chills or night sweats.   Previous GI work-up: Colonoscopy 04/20/2016 ([facility]): Mild sigmoid diverticulosis, small internal hemorrhoids.  Otherwise normal colonoscopy.  No need to repeat unless problems.  Colonoscopy 02/2010: Small colonic polyp s/p polypectomy, mild sigmoid diverticulosis, internal hemorrhoids. Bx-hyperplastic.   EGD by Dr. FClaudie LeachApril 2007:  -Medium sized hiatal hernia -Gastric polyp. Bx-fundic gland polyp     Past Medical History:   Diagnosis Date   Anemia     Anxiety and depression     Asthma     Atrioventricular block     CAD (coronary artery disease)     Chronic renal insufficiency     Diabetes mellitus without complication [place]     Family history of colon cancer     Fibromyalgia     GERD (gastroesophageal reflux disease)     Gout     Heart disease     History of colon polyps     Hypertension     Insomnia     Kidney disease     Kidney stones 01/12/1983   Major depression     Major depressive disorder, recurrent, mild ([place]     Migraines     Mixed hyperlipidemia     Osteoporosis 01/27/2019   Other amnesia     Rosacea     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 Otherwise normal colonoscopy   ESOPHAGOGASTRODUODENOSCOPY   11/16/2005  [place]. Patchy areas of mucosal erythema in the antrum compatible with gastritis. Polyps in the stomach. Nodule in the antrum. Medium hiatal hernia   pacemaker   09/27/2016   PACEMAKER REVISION   09/27/2020    PACEMAKER UPGRADE DUAL CHAMBER TO BIV           Family History  Problem Relation Age of Onset   High blood pressure Mother     Diabetes Mother          no medication needed   Kidney disease Mother     Pneumonia Mother     High blood pressure Father     Colon cancer Father     Cancer Maternal Grandmother          Leukemia   Cancer Other          Ovarian   Rectal cancer Neg Hx     Stomach cancer Neg Hx     Esophageal cancer Neg Hx     Liver cancer Neg Hx     Pancreatic cancer Neg Hx        Social History        Tobacco Use   Smoking status: Never   Smokeless tobacco: Never   Vaping Use   Vaping Use: Never used  Substance Use Topics   Alcohol use: Never   Drug use: Never            Current Outpatient Medications  Medication Sig Dispense Refill   albuterol (VENTOLIN HFA) 108 (90 Base) MCG/ACT inhaler INHALE 1 TO 2 PUFFS BY MOUTH EVERY 4 HOURS AS NEEDED FOR WHEEZING 9 g 0   aspirin 81 MG chewable tablet Chew 81 mg by mouth in the morning.        Blood Glucose Monitoring Suppl (ONETOUCH VERIO FLEX SYSTEM) w/Device KIT Check sugar once daily 1 kit 0   Budeson-Glycopyrrol-Formoterol (BREZTRI AEROSPHERE) 160-9-4.8 MCG/ACT AERO Inhale 2 puffs into the lungs 2 (two) times daily. 32.1 g 3   calcium-vitamin D (OSCAL WITH D) 500-200 MG-UNIT TABS tablet Take 1 tablet by mouth daily.       carvedilol (COREG) 12.5 MG tablet Take 12.5 mg by mouth 2 (two) times daily with a meal.       Cholecalciferol (VITAMIN D-3) 1000 UNITS CAPS Take 1,000 Units by mouth daily.       furosemide (LASIX) 20 MG tablet Take 1 tablet (20 mg total) by mouth daily. (Patient taking differently: Take 20 mg by mouth 2 (two) times daily.) 30 tablet 0   losartan (COZAAR) 25 MG tablet Take 25 mg by mouth daily.       Magnesium 250 MG TABS Take 250 mg by mouth daily.       metFORMIN (GLUCOPHAGE) 500 MG tablet TAKE 1 TABLET BY MOUTH TWICE DAILY WITH MORNING MEAL AND WITH EVENING MEAL 180 tablet 0   Multiple Vitamins-Minerals (CENTRUM SILVER ULTRA WOMENS PO) Take by mouth daily.       nitroGLYCERIN (NITROSTAT) 0.4 MG SL tablet DISSOLVE ONE TABLET UNDER THE TONGUE EVERY 5 MINUTES AS NEEDED FOR CHEST PAIN.  DO NOT EXCEED A TOTAL OF 3 DOSES IN 15 MINUTES 25 tablet 0   ondansetron (ZOFRAN) 4 MG tablet Take 1 tablet (4 mg total) by mouth every 8 (eight) hours as needed for nausea or vomiting. 40 tablet 0   OneTouch Delica Lancets 58K MISC USE 1  TO CHECK GLUCOSE ONCE DAILY *APPOINTMENT  REQUIRED  FOR  FUTURE  REFILLS 100 each  3   ONETOUCH VERIO test strip Use to check blood sugar once daily  DX CODE E11.9 100  each 1   venlafaxine XR (EFFEXOR XR) 75 MG 24 hr capsule Take 1 capsule (75 mg total) by mouth daily with breakfast. 90 capsule 1   Rivaroxaban (XARELTO) 15 MG TABS tablet Take 1 tablet (15 mg total) by mouth daily. (Patient taking differently: Take 15 mg by mouth. Take 1 tablet every other day) 90 tablet 0    No current facility-administered medications for this visit.           Allergies  Allergen Reactions   Atorvastatin Other (See Comments)      "muscle Cramps"   Budesonide Other (See Comments)      "fatigue"   Gabapentin Other (See Comments)      "fatigue"   Metoclopramide Other (See Comments)      "mayalgia"   Rosuvastatin Other (See Comments)      "muscle cramps"   Simvastatin Other (See Comments)      "muscle Cramps"   Wellbutrin [Bupropion]        Gi upset.    Sulfamethoxazole-Trimethoprim Rash      Review of Systems:  Constitutional: Denies fever, chills, diaphoresis, appetite change and fatigue.  HEENT: Denies photophobia, eye pain, redness, hearing loss, ear pain, congestion, sore throat, rhinorrhea, sneezing, mouth sores, neck pain, neck stiffness and tinnitus.   Respiratory: Denies SOB, DOE, cough, chest tightness,  and wheezing.   Cardiovascular: Denies chest pain, palpitations and leg swelling.  Genitourinary: Denies dysuria, urgency, frequency, hematuria, flank pain and difficulty urinating.  Musculoskeletal: Has myalgias, back pain, joint swelling, arthralgias and gait problem.  Skin: No rash.  Neurological: Denies dizziness, seizures, syncope, weakness, light-headedness, numbness and headaches.  Hematological: Denies adenopathy. Easy bruising, personal or family bleeding history  Psychiatric/Behavioral: Has anxiety or depression       Physical Exam:     BP 124/78   Pulse 79   Ht 4' 11" (1.499 m)   Wt 104 lb (47.2 kg)   SpO2 94%   BMI 21.01 kg/m     Wt Readings from Last 3 Encounters:  10/24/21 104 lb (47.2 kg)  10/13/21 102 lb 11.2 oz (46.6 kg)   09/19/21 104 lb 6.4 oz (47.4 kg)    Constitutional:  Well-developed, in no acute distress. Psychiatric: Normal mood and affect. Behavior is normal. HEENT: Conjunctivae are normal. No scleral icterus. Cardiovascular: Normal rate, regular rhythm. No edema Pulmonary/chest: Effort normal and breath sounds normal. No wheezing, rales or rhonchi. Abdominal: Soft, nondistended. Nontender. Bowel sounds active throughout. There are no masses palpable. No hepatomegaly. Rectal: Deferred.  Wants to hold off currently Neurological: Alert and oriented to person place and time. Skin: Skin is warm and dry. No rashes noted.   Data Reviewed: I have personally reviewed following labs and imaging studies   CBC: CBC Latest Ref Rng & Units 10/13/2021 07/22/2021 06/15/2021  WBC - 9.6 8.8 7.2  Hemoglobin 12.0 - 16.0 12.7 12.4 12.1  Hematocrit 36 - 46 38 37.4 36.2  Platelets 150 - 399 214 226 184      CMP: CMP Latest Ref Rng & Units 10/13/2021 08/18/2021 07/22/2021  Glucose 70 - 99 mg/dL - 122(H) 128(H)  BUN 4 - 21 42(A) 42(H) 30(H)  Creatinine 0.5 - 1.1 1.3(A) 1.24(H) 1.55(H)  Sodium 137 - 147 135(A) 140 137  Potassium 3.4 - 5.3 4.5 4.5 3.9  Chloride 99 - 108 98(A) 103 96  CO2 13 -  22 28(A) 23 24  Calcium 8.7 - 10.7 9.3 9.5 9.3  Total Protein 6.0 - 8.5 g/dL - 6.4 6.4  Total Bilirubin 0.0 - 1.2 mg/dL - 0.5 0.6  Alkaline Phos 25 - 125 112 110 114  AST 13 - 35 33 31 25  ALT 7 - 35 _0 Carmell Austria, MD [facility] [phone]

## 2022-04-13 NOTE — Op Note (Signed)
[facility] Patient Name: Bridget [name] Procedure Date: 04/13/2022 MRN: [id number] Attending MD: Lynann Bologna , MD Date of Birth: [birth year] CSN: [id number] Age: 81 Admit Type: Outpatient Procedure:                Upper GI endoscopy Indications:              Dysphagia with abnormal barium swallow showing                            esophageal dysmotility, prominent cricopharyngeus. Providers:                Lynann Bologna, MD, Marge Duncans, RN, Priscella Mann,                            Technician Referring MD:              Medicines:                Monitored Anesthesia Care Complications:            No immediate complications. Estimated Blood Loss:     Estimated blood loss: none. Procedure:                Pre-Anesthesia Assessment:                           - Prior to the procedure, a History and Physical                            was performed, and patient medications and                            allergies were reviewed. The patient's tolerance of                            previous anesthesia was also reviewed. The risks                            and benefits of the procedure and the sedation                            options and risks were discussed with the patient.                            All questions were answered, and informed consent                            was obtained. Prior Anticoagulants: Xarelto was                            held 3 days before. ASA Grade Assessment: III - A                            patient with severe systemic disease. After  reviewing the risks and benefits, the patient was                            deemed in satisfactory condition to undergo the                            procedure.                           After obtaining informed consent, the endoscope was                            passed under direct vision. Throughout the                            procedure, the patient's blood pressure,  pulse, and                            oxygen saturations were monitored continuously. The                            [id number]) Olympus endoscope was introduced                            through the mouth, and advanced to the second part                            of duodenum. The upper GI endoscopy was                            accomplished without difficulty. The patient                            tolerated the procedure well. Scope In: Scope Out: Findings:      The proximal esophagus was normal. The lower third of the esophagus was       mildly tortuous with well-defined Z-line at 40 cm, examined by NBI.       Biopsies were obtained from the proximal and distal esophagus with cold       forceps to r/o EoE. The scope was withdrawn. Dilation was performed with       a Maloney dilator with mild resistance at 48 Fr and 50 Fr.      A small hiatal hernia was present.      Multiple (15-20) small 4 to 6 mm sessile polyps with no bleeding and no       stigmata of recent bleeding were found in the stomach. Biopsies were       taken with a cold forceps for histology.      The examined duodenum was normal. Impression:               - Mild presbyesophagus s/p eso dilatation                           - Small hiatal hernia.                           -  Multiple gastric polyps. Biopsied. Moderate Sedation:      Not Applicable - Patient had care per Anesthesia. Recommendation:           - Patient has a contact number available for                            emergencies. The signs and symptoms of potential                            delayed complications were discussed with the                            patient. Return to normal activities tomorrow.                            Written discharge instructions were provided to the                            patient.                           - Post dilatation diet.                           - Continue present medications including omeprazole                             20 mg p.o. QD.                           - Resume Xarelto (rivaroxaban) at prior dose                            tomorrow.                           - Chew foods especially meats and breads well and                            eat slowly. Take all medications in sitting or                            standing position.                           - The findings and recommendations were discussed                            with the patient's family. Procedure Code(s):        --- Professional ---                           [phone], Esophagogastroduodenoscopy, flexible,                            transoral; with biopsy, single or multiple  43450, Dilation of esophagus, by unguided sound or                            bougie, single or multiple passes Diagnosis Code(s):        --- Professional ---                           Q39.9, Congenital malformation of esophagus,                            unspecified                           K44.9, Diaphragmatic hernia without obstruction or                            gangrene                           K31.7, Polyp of stomach and duodenum                           R13.10, Dysphagia, unspecified CPT copyright 2019 [employer]. All rights reserved. The codes documented in this report are preliminary and upon coder review may  be revised to meet current compliance requirements. Lynann Bologna, MD 04/13/2022 11:11:23 AM This report has been signed electronically. Number of Addenda: 0

## 2022-04-14 LAB — SURGICAL PATHOLOGY

## 2022-04-15 ENCOUNTER — Encounter: Payer: Self-pay | Admitting: Gastroenterology

## 2022-04-16 ENCOUNTER — Encounter (HOSPITAL_COMMUNITY): Payer: Self-pay | Admitting: Gastroenterology

## 2022-04-18 ENCOUNTER — Telehealth: Payer: Self-pay

## 2022-04-18 NOTE — Chronic Care Management (AMB) (Addendum)
    Called Bridget [name], No answer, left message of appointment on 04-19-2022 at 8:30 via telephone visit with Artelia Laroche, Pharm D. Notified to have all medications, supplements, blood pressure and/or blood sugar logs available during appointment and to return call if need to reschedule.    Huey Romans [facility] Clinical Pharmacist Assistant [phone]

## 2022-04-19 ENCOUNTER — Ambulatory Visit (INDEPENDENT_AMBULATORY_CARE_PROVIDER_SITE_OTHER): Payer: Medicare Other

## 2022-04-19 NOTE — Patient Instructions (Signed)
Visit Information   Goals Addressed   None    Patient Care Plan: CCM Pharmacy Care Plan     Problem Identified: dm, chf, hld   Priority: High  Onset Date: 10/25/2020     Long-Range Goal: Disease State Management   Start Date: 10/25/2020  Expected End Date: 10/25/2021  Recent Progress: On track  Priority: High  Note:   Current Barriers:  Unable to independently afford treatment regimen  Pharmacist Clinical Goal(s):  Over the next 90 days, patient will verbalize ability to afford treatment regimen through collaboration with PharmD and provider.   Interventions: 1:1 collaboration with [name], MD regarding development and update of comprehensive plan of care as evidenced by provider attestation and co-signature Inter-disciplinary care team collaboration (see longitudinal plan of care) Comprehensive medication review performed; medication list updated in electronic medical record  Hypertension (BP goal <130/80) BP Readings from Last 3 Encounters:  04/13/22 (!) 141/79  03/30/22 126/72  03/09/22 108/62  -Controlled -Current treatment: Carvedilol 12.5 mg  1 tablet mg bid Appropriate, Effective, Safe, Accessible Losartan 25 mg daily Appropriate, Effective, Safe, Accessible -Medications previously tried:   -Current home readings: today is 105/59 mmHg. Had patient recheck and pharmacist calling back to determine updated blood pressure.  -Current dietary habits: eats healthy overall. Meat and vegetables mainly. Currently putting up fresh garden vegetables.  -Current exercise habits: walking some -Denies hypotensive/hypertensive symptoms -Educated on Daily salt intake goal < 2300 mg; Exercise goal of 150 minutes per week; Importance of home blood pressure monitoring; -Counseled to monitor BP at home daily, document, and provide log at future appointments -Counseled on diet and exercise extensively Recommended to continue current medication  Diabetes (A1c goal <7%) -Managed  by Endo, Dr. Elayne Snare Lab Results  Component Value Date   HGBA1C 7.4 (H) 03/30/2022   HGBA1C 6.6 (A) 10/26/2021   HGBA1C 6.5 (A) 07/26/2021   Lab Results  Component Value Date   MICROALBUR 4.0 (H) 03/25/2021   LDLCALC 114 (H) 03/30/2022   CREATININE 1.23 (H) 03/30/2022   Lab Results  Component Value Date   NA 138 03/30/2022   K 4.1 03/30/2022   CREATININE 1.23 (H) 03/30/2022   EGFR 45 (L) 03/30/2022   GFRNONAA 60 09/08/2020   GLUCOSE 129 (H) 03/30/2022   Lab Results  Component Value Date   WBC 6.6 03/30/2022   HGB 10.4 (L) 03/30/2022   HCT 32.0 (L) 03/30/2022   MCV 87 03/30/2022   PLT 156 03/30/2022  -Controlled -Current medications: metformin 500 mg morning and 500 mg evening meals Appropriate, Effective, Safe, Accessible -Medications previously tried: pioglitazone, Metformin (Unable to tolerate max dose) -Current home glucose readings fasting glucose:  October 2023: 87, 121, 164, 128, 144  March 2023: 101/112/110 Sept 2023: 440,102,725,366,440,347,425,956 post prandial glucose: not taking  -Denies hypoglycemic/hyperglycemic symptoms -Current meal patterns:  Patient reports healthy diet and mainly eats at home.  -Current exercise: walking  -Educated on A1c and blood sugar goals; Complications of diabetes including kidney damage, retinal damage, and cardiovascular disease; Exercise goal of 150 minutes per week; -Counseled to check feet daily and get yearly eye exams -Counseled on diet and exercise extensively Sept 2023: Sugars definitely increasing. Per patient, she's been eating a lot more cookies. Counseled on diet, exercise, and starting SGLT2(-). She wants to wait until next week when she sees her DM doctor. Counseled extensively to call me after she sees them so I can do PAP   Heart Failure (Goal: manage symptoms and prevent exacerbations) -Controlled -  Last ejection fraction: 20-25% (Date: 07/2020) -HF type: Diastolic -NYHA Class: III (marked limitation  of activity) -Current treatment: carvedilol 12.5 mg bid Appropriate, Effective, Safe, Accessible Furosemide 20 mg every other day unless swelling increases Appropriate, Effective, Safe, Accessible Losartan 25 mg daily Appropriate, Effective, Safe, Accessible Nitroglycerin 0.4 mg sl every 5 minutes prn chest pain Appropriate, Effective, Safe, Accessible -Medications previously tried: none reported -Current home BP/HR readings: well controlled currently -Current dietary habits: eating healthy diet. Meat and vegetables.  -Current exercise habits: walking more and working to increase stamina since pacemaker updated 09/27/2020.  -Educated on Benefits of medications for managing symptoms and prolonging life Importance of blood pressure control -Counseled on diet and exercise extensively Recommended to continue current medication  Hyperlipidemia: (LDL goal < 55) The 10-year ASCVD risk score (Arnett DK, et al., 2019) is: 60.4%   Values used to calculate the score:     Age: 4 years     Sex: Female     Is Non-Hispanic African American: No     Diabetic: Yes     Tobacco smoker: No     Systolic Blood Pressure: 814 mmHg     Is BP treated: Yes     HDL Cholesterol: 76 mg/dL     Total Cholesterol: 216 mg/dL Lab Results  Component Value Date   CHOL 216 (H) 03/30/2022   CHOL 232 (H) 12/19/2021   CHOL 144 06/15/2021   Lab Results  Component Value Date   HDL 76 03/30/2022   HDL 71 12/19/2021   HDL 69 06/15/2021   Lab Results  Component Value Date   LDLCALC 114 (H) 03/30/2022   LDLCALC 130 (H) 12/19/2021   LDLCALC 58 06/15/2021   Lab Results  Component Value Date   TRIG 153 (H) 03/30/2022   TRIG 178 (H) 12/19/2021   TRIG 95 06/15/2021   Lab Results  Component Value Date   CHOLHDL 2.8 03/30/2022   CHOLHDL 3.3 12/19/2021   CHOLHDL 2.1 06/15/2021   Lab Results  Component Value Date   LDLDIRECT 94.2 12/05/2013   Lab Results  Component Value Date   TSH 2.040 07/22/2021  Last  vitamin D No results found for: "25OHVITD2", "25OHVITD3", "VD25OH"  -Uncontrolled -Current treatment: Aspirin 81 mg daily  Appropriate, Effective, Safe, Accessible Lovastatin 54m Appropriate, Effective, Safe, Accessible -Medications previously tried: atorvastatin, rosuvastatin, simvastatin, fluvastatin, Lovastatin -Current dietary patterns: overall healthy diet.  -Current exercise habits: walking more and working to rebuild stamina.  -Educated on Cholesterol goals;  Benefits of statin for ASCVD risk reduction; Importance of limiting foods high in cholesterol; Exercise goal of 150 minutes per week; -Counseled on diet and exercise extensively Sept 2023: Patient states she always has issues on statins. Will ask PCP for Vit D lab (Patient also states she's tired)  Anticoagulation -Controlled -Current treatment: Xarelto 18mQD Appropriate, Effective, Safe, Accessible October 2022: PAP due to be renewed 07/06/21, will complete  COPD     No data to display        Pulmonary Functions Testing Results:  No results found for: "FEV1", "FVC", "FEV1FVC", "TLC", "DLCO"  -Controlled -Current treatment: Breztri Appropriate, Effective, Safe, Accessible October 2022: PAP due to be renewed 07/06/21, will complete   Depression/Anxiety -Controlled -Current treatment: Venlafaxine XR 7525mD Appropriate, Effective, Safe, Accessible -Medications previously tried/failed: Trintellix (Didn't help), Wellbutrin (N/V), and Vraylar (Cost) -PHQ9:     03/30/2022    8:22 AM 12/19/2021    9:22 AM 09/19/2021    1:33 PM  Depression screen PHQ  2/9  Decreased Interest 1 1 0  Down, Depressed, Hopeless 1 1 0  PHQ - 2 Score 2 2 0  Altered sleeping _0 Tired, decreased energy _1 Change in appetite 1 1 0  Feeling bad or failure about yourself  1 1 0  Trouble concentrating _2 Moving slowly or fidgety/restless 0 2 1  Suicidal thoughts 0 0 0  PHQ-9 Score _3 Difficult doing work/chores Not  difficult at all Somewhat difficult   -GAD7:     08/18/2021   11:35 AM 06/30/2021    2:43 PM  GAD 7 : Generalized Anxiety Score  Nervous, Anxious, on Edge 3 1  Control/stop worrying 2 3  Worry too much - different things 2 1  Trouble relaxing 3 1  Restless 3 3  Easily annoyed or irritable 1 1  Afraid - awful might happen 2 0  Total GAD 7 Score 16 10  Anxiety Difficulty Somewhat difficult   -Educated on Benefits of medication for symptom control Sept 2023: Recommend increasing to 127m. PHQ9 still elevated and when I asked her, "How's your mood" her and her husband laughed in a way that made it seem "not good."    Patient Goals/Self-Care Activities Over the next 90 days, patient will:  - take medications as prescribed focus on medication adherence by using pill box check glucose daily, document, and provide at future appointments check blood pressure daily, document, and provide at future appointments Contact provider or pharmacist if patient decides to trial FIran   Follow Up Plan: Telephone follow up appointment with care management team member scheduled for: PRN  NArizona Constable Pharm.D. -- [phone]     Ms. JKelloggwas given information about Chronic Care Management services today including:  CCM service includes personalized support from designated clinical staff supervised by her physician, including individualized plan of care and coordination with other care providers 24/7 contact phone numbers for assistance for urgent and routine care needs. Standard insurance, coinsurance, copays and deductibles apply for chronic care management only during months in which we provide at least 20 minutes of these services. Most insurances cover these services at 100%, however patients may be responsible for any copay, coinsurance and/or deductible if applicable. This service may help you avoid the need for more expensive face-to-face services. Only one practitioner may furnish and  bill the service in a calendar month. The patient may stop CCM services at any time (effective at the end of the month) by phone call to the office staff.  Patient agreed to services and verbal consent obtained.   The patient verbalized understanding of instructions, educational materials, and care plan provided today and DECLINED offer to receive copy of patient instructions, educational materials, and care plan.  The pharmacy team will reach out to the patient again over the next 60 days.   NLane Hacker RPulaski

## 2022-04-19 NOTE — Progress Notes (Signed)
Chronic Care Management Pharmacy Note    04/19/2022 Name:  Bridget [name] MRN:  [id number] DOB:  1940-08-19   Plan Recommendations:  Recommend Vit D levels. Patient having myalgias and Vit D and TSH are most common reason why (TSH conducted 1 year ago) Counseled patient extensively on starting Farxiga. Wants to wait one week. Counseled to call me before starting it since it'll be expensive Recommend increasing Venlafaxine dose. Patient's affect was notably down, PHQ9>5, and when I asked, "How's your mood" her response was definitely off  Subjective: Bridget [name] is an 81 y.o. year old female who is a primary patient of Cox, Kirsten, MD.  The CCM team was consulted for assistance with disease management and care coordination needs.    Engaged with patient by telephone for follow up visit in response to provider referral for pharmacy case management and/or care coordination services.   Consent to Services:  The patient was given information about Chronic Care Management services, agreed to services, and gave verbal consent prior to initiation of services.  Please see initial visit note for detailed documentation.   Patient Care Team: [name], MD as PCP - General (Family Medicine) Elayne Snare, MD as Consulting Physician (Endocrinology) Marice Potter, MD as Consulting Physician (Oncology) Flossie Buffy., MD as Referring Physician (Cardiology) Jackquline Denmark, MD as Consulting Physician (Gastroenterology) [facility] (Ophthalmology) Lane Hacker, [facility] (Pharmacist)  Recent office visits:  12/19/21 [name] MD. Seen for routine visit. Please check if patient still taking lovastatin 40 mg before bed. Restart if stopped. If still taking, recommend add zetia 10 mg daily.    Recent consult visits:  01/12/22 (Cardiology) Francia Greaves RN. Orders only. Ordered Xarelto 49m.    12/23/21 (Cardiology) FMar DaringMD. Seen for HTN. No med  changes.    Hospital visits:  None   Objective:  Lab Results  Component Value Date   CREATININE 1.23 (H) 03/30/2022   BUN 27 03/30/2022   GFR 32.91 (L) 03/23/2020   GFRNONAA 60 09/08/2020   GFRAA 69 09/08/2020   NA 138 03/30/2022   K 4.1 03/30/2022   CALCIUM 8.9 03/30/2022   CO2 22 03/30/2022    Lab Results  Component Value Date/Time   HGBA1C 7.4 (H) 03/30/2022 08:56 AM   HGBA1C 6.6 (A) 10/26/2021 08:33 AM   HGBA1C 6.5 (A) 07/26/2021 08:30 AM   HGBA1C 6.7 (H) 10/22/2020 09:01 AM   GFR 32.91 (L) 03/23/2020 08:52 AM   GFR 54.56 (L) 01/02/2019 09:32 AM   MICROALBUR 4.0 (H) 03/25/2021 08:59 AM   MICROALBUR <0.7 03/23/2020 10:49 AM    Last diabetic Eye exam:  Lab Results  Component Value Date/Time   HMDIABEYEEXA No Retinopathy 01/12/2021 12:00 AM    Last diabetic Foot exam: No results found for: "HMDIABFOOTEX"   Lab Results  Component Value Date   CHOL 216 (H) 03/30/2022   HDL 76 03/30/2022   LDLCALC 114 (H) 03/30/2022   LDLDIRECT 94.2 12/05/2013   TRIG 153 (H) 03/30/2022   CHOLHDL 2.8 03/30/2022       Latest Ref Rng & Units 03/30/2022    8:56 AM 03/07/2022    4:40 PM 12/19/2021    9:37 AM  Hepatic Function  Total Protein 6.0 - 8.5 g/dL 5.8  6.3  6.2   Albumin 3.8 - 4.8 g/dL 3.5  3.7  3.9   AST 0 - 40 IU/L 19  20  20    ALT 0 - 32 IU/L 17  17  12   Alk Phosphatase 44 - 121 IU/L 127  143  128   Total Bilirubin 0.0 - 1.2 mg/dL 0.4  0.6  0.4     Lab Results  Component Value Date/Time   TSH 2.040 07/22/2021 10:05 AM   TSH 1.64 03/23/2020 08:52 AM   FREET4 1.28 03/23/2020 08:52 AM   FREET4 1.02 01/02/2019 09:32 AM       Latest Ref Rng & Units 03/30/2022    8:56 AM 03/07/2022    4:40 PM 12/19/2021    9:37 AM  CBC  WBC 3.4 - 10.8 x10E3/uL 6.6  10.5  7.4   Hemoglobin 11.1 - 15.9 g/dL 10.4  11.1  11.4   Hematocrit 34.0 - 46.6 % 32.0  34.1  34.9   Platelets 150 - 450 x10E3/uL 156  220  214     No results found for: "VD25OH"  Clinical ASCVD: No  The  10-year ASCVD risk score (Arnett DK, et al., 2019) is: 60.4%   Values used to calculate the score:     Age: 62 years     Sex: Female     Is Non-Hispanic African American: No     Diabetic: Yes     Tobacco smoker: No     Systolic Blood Pressure: 465 mmHg     Is BP treated: Yes     HDL Cholesterol: 76 mg/dL     Total Cholesterol: 216 mg/dL       03/30/2022    8:22 AM 12/19/2021    9:22 AM 09/19/2021    1:33 PM  Depression screen PHQ 2/9  Decreased Interest 1 1 0  Down, Depressed, Hopeless 1 1 0  PHQ - 2 Score 2 2 0  Altered sleeping 1 1 1   Tired, decreased energy 2 2 1   Change in appetite 1 1 0  Feeling bad or failure about yourself  1 1 0  Trouble concentrating 1 2 1   Moving slowly or fidgety/restless 0 2 1  Suicidal thoughts 0 0 0  PHQ-9 Score 8 11 4   Difficult doing work/chores Not difficult at all Somewhat difficult      Social History   Tobacco Use  Smoking Status Never   Passive exposure: Past  Smokeless Tobacco Never   BP Readings from Last 3 Encounters:  04/13/22 (!) 141/79  03/30/22 126/72  03/09/22 108/62   Pulse Readings from Last 3 Encounters:  04/13/22 84  03/30/22 89  03/09/22 81   Wt Readings from Last 3 Encounters:  03/30/22 99 lb (44.9 kg)  03/09/22 100 lb 12.8 oz (45.7 kg)  03/07/22 99 lb (44.9 kg)    Assessment/Interventions: Review of patient past medical history, allergies, medications, health status, including review of consultants reports, laboratory and other test data, was performed as part of comprehensive evaluation and provision of chronic care management services.   SDOH:  (Social Determinants of Health) assessments and interventions performed: Yes SDOH Interventions    Flowsheet Row Chronic Care Management from 04/19/2022 in [place] Office Visit from 03/30/2022 in [place] Office Visit from 12/19/2021 in [place] Management from 10/19/2021 in [place] Office Visit from 08/18/2021 in [place] Office Visit from 06/15/2021 in [place]  SDOH Interventions        Transportation Interventions Intervention Not Indicated -- -- Intervention Not Indicated -- --  Depression Interventions/Treatment  -- Currently on Treatment Currently on Treatment -- Medication, Referral to Psychiatry Medication  Financial Strain Interventions Other (Comment)  [PAP] -- -- Other (Comment)  [See CP (PAP)] -- --       CCM Care Plan  Allergies  Allergen Reactions   Atorvastatin Other (See Comments)    "muscle Cramps"   Budesonide Other (See Comments)    "fatigue"   Gabapentin Other (See Comments)    "fatigue"   Metoclopramide Other (See Comments)    "mayalgia"   Rosuvastatin Other (See Comments)    "muscle cramps"   Simvastatin Other (See Comments)    "muscle Cramps"   Wellbutrin [Bupropion]     Gi upset.    Sulfamethoxazole-Trimethoprim Rash    Medications Reviewed Today     Reviewed by Lane Hacker, [facility] (Pharmacist) on 04/19/22 at 0907  Med List Status: <None>   Medication Order Taking? Sig Documenting Provider Last Dose Status Informant  albuterol (VENTOLIN HFA) 108 (90 Base) MCG/ACT inhaler [id number] No INHALE 1 TO 2 PUFFS BY MOUTH EVERY 4 HOURS AS NEEDED FOR WHEEZING Cox, Kirsten, MD Past Month Active Self           Med Note Wilmon Pali, MELISSA R   Fri Apr 07, 2022  4:30 PM) Uses 2 times daily   aspirin 81 MG chewable tablet [id number] No Chew 81 mg by mouth in the morning.  [provider] 04/13/2022 Active Self  Blood Glucose Monitoring Suppl (ONETOUCH VERIO FLEX SYSTEM) w/Device KIT [id number] No Check sugar once daily Elayne Snare, MD Taking Active Self  Budeson-Glycopyrrol-Formoterol (BREZTRI AEROSPHERE) 160-9-4.8 MCG/ACT AERO [id number] No Inhale 2 puffs into the lungs 2 (two) times daily. [name], MD 04/10/2022 Active Self  calcium-vitamin D (OSCAL WITH D) 500-200 MG-UNIT TABS tablet [id number] No Take 1 tablet by mouth daily. [provider] 04/10/2022 Active Self  carvedilol (COREG) 12.5 MG tablet [id number] No Take 12.5 mg by mouth 2 (two) times daily with a meal. [provider] 04/13/2022 Active Self  Cholecalciferol (VITAMIN D-3) 1000 UNITS CAPS [id number] No Take 1,000 Units by mouth daily. [provider] 04/10/2022 Active Self  colchicine 0.6 MG tablet [id number] No Take 1 tablet (0.6 mg total) by mouth 2 (two) times daily. Cox, Kirsten, MD More than a month Active Self  furosemide (LASIX) 40 MG tablet [id number] No Take 40 mg by mouth daily. [provider] 04/10/2022 Active Self  losartan (COZAAR) 25 MG tablet [id number] No Take 25 mg by mouth daily. [provider] Unknown Active Self  lovastatin (MEVACOR) 40 MG tablet [id number] No Take 1 tablet (40 mg total) by mouth at bedtime. [name], MD 04/10/2022 Active Self  Magnesium 250 MG TABS [id number] No Take 1 tablet (250 mg total) by mouth in the morning and at bedtime. [name], MD 04/10/2022 Active Self  metFORMIN (GLUCOPHAGE) 500 MG tablet [id number] No TAKE 1 TABLET BY MOUTH TWICE DAILY WITH MORNING MEAL AND WITH EVENING MEAL Elayne Snare, MD 04/10/2022 Active Self  Multiple Vitamins-Minerals (CENTRUM SILVER ULTRA WOMENS PO) [id number] No Take 1 tablet by mouth daily. [provider] 04/10/2022 Active Self  nitroGLYCERIN (NITROSTAT) 0.4 MG SL tablet [id number] No DISSOLVE ONE TABLET UNDER THE TONGUE EVERY 5 MINUTES AS NEEDED FOR CHEST PAIN.  DO NOT EXCEED A TOTAL OF 3 DOSES IN 15 MINUTES Cox, Kirsten, MD More than a month Active Self  omeprazole (PRILOSEC) 20 MG capsule [id number] No Take 1 capsule (20 mg total) by mouth daily. Jackquline Denmark, MD 04/10/2022 Active Self  ondansetron (ZOFRAN) 4 MG tablet [id number] No Take 1 tablet (4 mg  total) by mouth every 8 (eight) hours as needed for nausea or vomiting. CoxElnita Maxwell, MD More than a month Active Self  OneTouch Delica Lancets 56C MISC [id number] No USE 1  TO CHECK GLUCOSE ONCE DAILY  *APPOINTMENT  REQUIRED  FOR  FUTURE  REFILLS Elayne Snare, MD Taking Active Self  [facility] VERIO test strip [id number] No Use to check blood sugar once daily  DX CODE E11.9 Elayne Snare, MD Taking Active Self  Polyethyl Glycol-Propyl Glycol (SYSTANE ULTRA) 0.4-0.3 % SOLN [id number] No Place 1 drop into both eyes as needed (dry eyes). [provider] 04/12/2022 Active Self  Rivaroxaban (XARELTO) 15 MG TABS tablet [id number] No Take 15 mg by mouth every other day. [provider] 04/10/2022 Active Self  venlafaxine XR (EFFEXOR-XR) 75 MG 24 hr capsule [id number] No TAKE 1 CAPSULE BY MOUTH ONCE DAILY WITH Guy Sandifer, MD 04/12/2022 Active Self            Patient Active Problem List   Diagnosis Date Noted   Esophageal dysphagia    Muscle spasm 03/30/2022   Mild persistent asthma 03/30/2022   Depression, major, recurrent, mild ([place]) 03/30/2022   Hypertension complicating diabetes ([place]) 03/30/2022   Need for hepatitis C screening test 03/30/2022   Right foot pain 03/07/2022   Adhesive capsulitis of left shoulder 09/19/2021   Acute pain of right shoulder 09/19/2021   Colon cancer screening 09/19/2021   Memory loss 07/25/2021   Other fatigue 07/25/2021   Acquired thrombophilia ([place]) 07/17/2021   Heart and renal disease, hypertensive, stage 1-4 or unspecified chronic kidney disease, with heart failure ([place]) 06/15/2021   NICM (nonischemic cardiomyopathy) ([place]) 05/06/2021   Anemia of chronic renal failure 10/13/2020   Drug-induced myopathy 06/25/2020   Presence of cardiac pacemaker 06/25/2020   Major depressive disorder, recurrent episode, mild ([place]) 06/25/2020   Nonischemic dilated cardiomyopathy ([place]) 06/18/2020   Heart failure with reduced ejection fraction, NYHA class III ([place]) 05/29/2020   History of cardiac cath 03/19/2020   Diabetic glomerulopathy ([place]) 11/20/2019   Night sweats 11/20/2019   Adhesive capsulitis of right shoulder 11/20/2019   At moderate risk for  fall 11/20/2019   TIA (transient ischemic attack) 06/03/2018   History of DVT (deep vein thrombosis) 10/13/2016   Stage 3b chronic kidney disease ([place]) 06/06/2013   Mixed hyperlipidemia 06/05/2013   Thyroid nodule 06/05/2013    Immunization History  Administered Date(s) Administered   Fluad Quad(high Dose 65+) 05/20/2020, 06/15/2021   Influenza, High Dose Seasonal PF 06/05/2017   Influenza,trivalent, recombinat, inj, PF 05/14/2012   Influenza-Unspecified 07/02/2014, 04/13/2015, 04/24/2016, 06/14/2018   PFIZER(Purple Top)SARS-COV-2 Vaccination 08/29/2019, 09/19/2019, 05/14/2020, 01/03/2021   Pneumococcal Conjugate-13 04/27/2014   Pneumococcal Polysaccharide-23 07/14/2010, 07/14/2010, 12/02/2015   Tdap 11/01/2017    Conditions to be addressed/monitored:  Hypertension, Hyperlipidemia, Diabetes, Heart Failure, Depression, Osteoporosis and anemia  Care Plan : [place]  Updates made by Lane Hacker, [place] since 04/19/2022 12:00 AM     Problem: dm, chf, hld   Priority: High  Onset Date: 10/25/2020     Long-Range Goal: Disease State Management   Start Date: 10/25/2020  Expected End Date: 10/25/2021  Recent Progress: On track  Priority: High  Note:   Current Barriers:  Unable to independently afford treatment regimen  Pharmacist Clinical Goal(s):  Over the next 90 days, patient will verbalize ability to afford treatment regimen through collaboration with PharmD and provider.   Interventions: 1:1 collaboration with [name], MD regarding development and update of comprehensive  plan of care as evidenced by provider attestation and co-signature Inter-disciplinary care team collaboration (see longitudinal plan of care) Comprehensive medication review performed; medication list updated in electronic medical record  Hypertension (BP goal <130/80) BP Readings from Last 3 Encounters:  04/13/22 (!) 141/79  03/30/22 126/72  03/09/22 108/62  -Controlled -Current  treatment: Carvedilol 12.5 mg  1 tablet mg bid Appropriate, Effective, Safe, Accessible Losartan 25 mg daily Appropriate, Effective, Safe, Accessible -Medications previously tried:   -Current home readings: today is 105/59 mmHg. Had patient recheck and pharmacist calling back to determine updated blood pressure.  -Current dietary habits: eats healthy overall. Meat and vegetables mainly. Currently putting up fresh garden vegetables.  -Current exercise habits: walking some -Denies hypotensive/hypertensive symptoms -Educated on Daily salt intake goal < 2300 mg; Exercise goal of 150 minutes per week; Importance of home blood pressure monitoring; -Counseled to monitor BP at home daily, document, and provide log at future appointments -Counseled on diet and exercise extensively Recommended to continue current medication  Diabetes (A1c goal <7%) -Managed by Endo, Dr. Elayne Snare Lab Results  Component Value Date   HGBA1C 7.4 (H) 03/30/2022   HGBA1C 6.6 (A) 10/26/2021   HGBA1C 6.5 (A) 07/26/2021   Lab Results  Component Value Date   MICROALBUR 4.0 (H) 03/25/2021   LDLCALC 114 (H) 03/30/2022   CREATININE 1.23 (H) 03/30/2022   Lab Results  Component Value Date   NA 138 03/30/2022   K 4.1 03/30/2022   CREATININE 1.23 (H) 03/30/2022   EGFR 45 (L) 03/30/2022   GFRNONAA 60 09/08/2020   GLUCOSE 129 (H) 03/30/2022   Lab Results  Component Value Date   WBC 6.6 03/30/2022   HGB 10.4 (L) 03/30/2022   HCT 32.0 (L) 03/30/2022   MCV 87 03/30/2022   PLT 156 03/30/2022  -Controlled -Current medications: metformin 500 mg morning and 500 mg evening meals Appropriate, Effective, Safe, Accessible -Medications previously tried: pioglitazone, Metformin (Unable to tolerate max dose) -Current home glucose readings fasting glucose:  October 2023: 87, 121, 164, 128, 144  March 2023: 101/112/110 Sept 2023: 409,811,914,782,956,213,086,578 post prandial glucose: not taking  -Denies  hypoglycemic/hyperglycemic symptoms -Current meal patterns:  Patient reports healthy diet and mainly eats at home.  -Current exercise: walking  -Educated on A1c and blood sugar goals; Complications of diabetes including kidney damage, retinal damage, and cardiovascular disease; Exercise goal of 150 minutes per week; -Counseled to check feet daily and get yearly eye exams -Counseled on diet and exercise extensively Sept 2023: Sugars definitely increasing. Per patient, she's been eating a lot more cookies. Counseled on diet, exercise, and starting SGLT2(-). She wants to wait until next week when she sees her DM doctor. Counseled extensively to call me after she sees them so I can do PAP   Heart Failure (Goal: manage symptoms and prevent exacerbations) -Controlled -Last ejection fraction: 20-25% (Date: 07/2020) -HF type: Diastolic -NYHA Class: III (marked limitation of activity) -Current treatment: carvedilol 12.5 mg bid Appropriate, Effective, Safe, Accessible Furosemide 20 mg every other day unless swelling increases Appropriate, Effective, Safe, Accessible Losartan 25 mg daily Appropriate, Effective, Safe, Accessible Nitroglycerin 0.4 mg sl every 5 minutes prn chest pain Appropriate, Effective, Safe, Accessible -Medications previously tried: none reported -Current home BP/HR readings: well controlled currently -Current dietary habits: eating healthy diet. Meat and vegetables.  -Current exercise habits: walking more and working to increase stamina since pacemaker updated 09/27/2020.  -Educated on Benefits of medications for managing symptoms and prolonging life Importance of blood pressure control -  Counseled on diet and exercise extensively Recommended to continue current medication  Hyperlipidemia: (LDL goal < 55) The 10-year ASCVD risk score (Arnett DK, et al., 2019) is: 60.4%   Values used to calculate the score:     Age: 61 years     Sex: Female     Is Non-Hispanic African  American: No     Diabetic: Yes     Tobacco smoker: No     Systolic Blood Pressure: 250 mmHg     Is BP treated: Yes     HDL Cholesterol: 76 mg/dL     Total Cholesterol: 216 mg/dL Lab Results  Component Value Date   CHOL 216 (H) 03/30/2022   CHOL 232 (H) 12/19/2021   CHOL 144 06/15/2021   Lab Results  Component Value Date   HDL 76 03/30/2022   HDL 71 12/19/2021   HDL 69 06/15/2021   Lab Results  Component Value Date   LDLCALC 114 (H) 03/30/2022   LDLCALC 130 (H) 12/19/2021   LDLCALC 58 06/15/2021   Lab Results  Component Value Date   TRIG 153 (H) 03/30/2022   TRIG 178 (H) 12/19/2021   TRIG 95 06/15/2021   Lab Results  Component Value Date   CHOLHDL 2.8 03/30/2022   CHOLHDL 3.3 12/19/2021   CHOLHDL 2.1 06/15/2021   Lab Results  Component Value Date   LDLDIRECT 94.2 12/05/2013   Lab Results  Component Value Date   TSH 2.040 07/22/2021  Last vitamin D No results found for: "25OHVITD2", "25OHVITD3", "VD25OH"  -Uncontrolled -Current treatment: Aspirin 81 mg daily  Appropriate, Effective, Safe, Accessible Lovastatin 69m Appropriate, Effective, Safe, Accessible -Medications previously tried: atorvastatin, rosuvastatin, simvastatin, fluvastatin, Lovastatin -Current dietary patterns: overall healthy diet.  -Current exercise habits: walking more and working to rebuild stamina.  -Educated on Cholesterol goals;  Benefits of statin for ASCVD risk reduction; Importance of limiting foods high in cholesterol; Exercise goal of 150 minutes per week; -Counseled on diet and exercise extensively Sept 2023: Patient states she always has issues on statins. Will ask PCP for Vit D lab (Patient also states she's tired)  Anticoagulation -Controlled -Current treatment: Xarelto 169mQD Appropriate, Effective, Safe, Accessible October 2022: PAP due to be renewed 07/06/21, will complete  COPD     No data to display        Pulmonary Functions Testing Results:  No results  found for: "FEV1", "FVC", "FEV1FVC", "TLC", "DLCO"  -Controlled -Current treatment: Breztri Appropriate, Effective, Safe, Accessible October 2022: PAP due to be renewed 07/06/21, will complete   Depression/Anxiety -Controlled -Current treatment: Venlafaxine XR 7573mD Appropriate, Effective, Safe, Accessible -Medications previously tried/failed: Trintellix (Didn't help), Wellbutrin (N/V), and Vraylar (Cost) -PHQ9:     03/30/2022    8:22 AM 12/19/2021    9:22 AM 09/19/2021    1:33 PM  Depression screen PHQ 2/9  Decreased Interest 1 1 0  Down, Depressed, Hopeless 1 1 0  PHQ - 2 Score 2 2 0  Altered sleeping 1 1 1   Tired, decreased energy 2 2 1   Change in appetite 1 1 0  Feeling bad or failure about yourself  1 1 0  Trouble concentrating 1 2 1   Moving slowly or fidgety/restless 0 2 1  Suicidal thoughts 0 0 0  PHQ-9 Score 8 11 4   Difficult doing work/chores Not difficult at all Somewhat difficult   -GAD7:     08/18/2021   11:35 AM 06/30/2021    2:43 PM  GAD 7 : Generalized Anxiety Score  Nervous, Anxious, on Edge 3 1  Control/stop worrying 2 3  Worry too much - different things 2 1  Trouble relaxing 3 1  Restless 3 3  Easily annoyed or irritable 1 1  Afraid - awful might happen 2 0  Total GAD 7 Score 16 10  Anxiety Difficulty Somewhat difficult   -Educated on Benefits of medication for symptom control Sept 2023: Recommend increasing to 122m. PHQ9 still elevated and when I asked her, "How's your mood" her and her husband laughed in a way that made it seem "not good."    Patient Goals/Self-Care Activities Over the next 90 days, patient will:  - take medications as prescribed focus on medication adherence by using pill box check glucose daily, document, and provide at future appointments check blood pressure daily, document, and provide at future appointments Contact provider or pharmacist if patient decides to trial FIran   Follow Up Plan: Telephone follow up  appointment with care management team member scheduled for: PRN  NArizona Constable Pharm.D. - [phone]        Medication Assistance:  - Breztriobtained through [place] medication assistance program.  Enrollment ends 08/13/2021 -Xarelto through PAP as well -Will try Farxiga PAP if provider prescribes it  [facility]  [facility] [place]Phone: [phone]: [phone]  Uses pill box? Yes Pt endorses 100% compliance  We discussed: Benefits of medication synchronization, packaging and delivery as well as enhanced pharmacist oversight with [employer]. Patient decided to: Continue current medication management strategy  Care Plan and Follow Up Patient Decision:  Patient agrees to Care Plan and Follow-up.  Plan: Telephone follow up appointment with care management team member scheduled for:  prn  NArizona Constable Pharm.D. -- [phone]

## 2022-04-20 ENCOUNTER — Ambulatory Visit (INDEPENDENT_AMBULATORY_CARE_PROVIDER_SITE_OTHER): Payer: Medicare Other | Admitting: Family Medicine

## 2022-04-20 ENCOUNTER — Encounter: Payer: Self-pay | Admitting: Family Medicine

## 2022-04-20 VITALS — BP 130/70 | HR 96 | Temp 97.5°F | Resp 15 | Ht 59.8 in | Wt 99.0 lb

## 2022-04-20 DIAGNOSIS — Z0000 Encounter for general adult medical examination without abnormal findings: Secondary | ICD-10-CM

## 2022-04-20 DIAGNOSIS — M810 Age-related osteoporosis without current pathological fracture: Secondary | ICD-10-CM

## 2022-04-20 DIAGNOSIS — M791 Myalgia, unspecified site: Secondary | ICD-10-CM

## 2022-04-20 NOTE — Progress Notes (Unsigned)
Subjective:   Bridget [name] is a 81 y.o. female who presents for Medicare Annual (Subsequent) preventive examination.  Review of Systems    Review of Systems  Constitutional:  Negative for chills and fever.  HENT:  Negative for ear pain and sinus pain.   Respiratory:  Positive for shortness of breath. Negative for cough.   Cardiovascular:  Negative for chest pain and palpitations.  Gastrointestinal:  Negative for abdominal pain, constipation, diarrhea, nausea and vomiting.  Genitourinary:  Negative for dysuria and frequency.    Cardiac Risk Factors include: advanced age (>57mn, >>38women);diabetes mellitus;hypertension     Objective:    Today's Vitals   04/20/22 0758 04/20/22 0846  BP: 130/70   Pulse: 96   Resp: 15   Temp: (!) 97.5 F (36.4 C)   SpO2: 93%   Weight: 99 lb (44.9 kg)   Height: 4' 11.8" (1.519 m)   PainSc:  3    Body mass index is 19.46 kg/m.     04/20/2022    8:05 AM 04/13/2022   10:04 AM 10/13/2021   10:42 AM 01/13/2021    9:59 AM 10/13/2020   10:02 AM 01/13/2020    9:44 AM  Advanced Directives  Does Patient Have a Medical Advance Directive? Yes Yes No No No Yes  Type of AParamedicof AFive PointsLiving will [place]Living will    [place] Borough[place] will  Does patient want to make changes to medical advance directive?      No - Patient declined  Copy of [place] Chart? No - copy requested No - copy requested    No - copy requested  Would patient like information on creating a medical advance directive?    Yes (MAU/Ambulatory/Procedural Areas - Information given)      Current Medications (verified) Outpatient Encounter Medications as of 04/20/2022  Medication Sig   albuterol (VENTOLIN HFA) 108 (90 Base) MCG/ACT inhaler INHALE 1 TO 2 PUFFS BY MOUTH EVERY 4 HOURS AS NEEDED FOR WHEEZING   aspirin 81 MG chewable tablet Chew 81 mg by mouth in the morning.    Blood Glucose  Monitoring Suppl (ONETOUCH VERIO FLEX SYSTEM) w/Device KIT Check sugar once daily   Budeson-Glycopyrrol-Formoterol (BREZTRI AEROSPHERE) 160-9-4.8 MCG/ACT AERO Inhale 2 puffs into the lungs 2 (two) times daily.   calcium-vitamin D (OSCAL WITH D) 500-200 MG-UNIT TABS tablet Take 1 tablet by mouth daily.   carvedilol (COREG) 12.5 MG tablet Take 12.5 mg by mouth 2 (two) times daily with a meal.   Cholecalciferol (VITAMIN D-3) 1000 UNITS CAPS Take 1,000 Units by mouth daily.   colchicine 0.6 MG tablet Take 1 tablet (0.6 mg total) by mouth 2 (two) times daily.   furosemide (LASIX) 40 MG tablet Take 40 mg by mouth daily.   losartan (COZAAR) 25 MG tablet Take 25 mg by mouth daily.   lovastatin (MEVACOR) 40 MG tablet Take 1 tablet (40 mg total) by mouth at bedtime.   Magnesium 250 MG TABS Take 1 tablet (250 mg total) by mouth in the morning and at bedtime.   metFORMIN (GLUCOPHAGE) 500 MG tablet TAKE 1 TABLET BY MOUTH TWICE DAILY WITH MORNING MEAL AND WITH EVENING MEAL   Multiple Vitamins-Minerals (CENTRUM SILVER ULTRA WOMENS PO) Take 1 tablet by mouth daily.   nitroGLYCERIN (NITROSTAT) 0.4 MG SL tablet DISSOLVE ONE TABLET UNDER THE TONGUE EVERY 5 MINUTES AS NEEDED FOR CHEST PAIN.  DO NOT EXCEED A TOTAL OF 3  DOSES IN 15 MINUTES   omeprazole (PRILOSEC) 20 MG capsule Take 1 capsule (20 mg total) by mouth daily.   ondansetron (ZOFRAN) 4 MG tablet Take 1 tablet (4 mg total) by mouth every 8 (eight) hours as needed for nausea or vomiting.   OneTouch Delica Lancets 46K MISC USE 1  TO CHECK GLUCOSE ONCE DAILY *APPOINTMENT  REQUIRED  FOR  FUTURE  REFILLS   ONETOUCH VERIO test strip Use to check blood sugar once daily  DX CODE E11.9   Polyethyl Glycol-Propyl Glycol (SYSTANE ULTRA) 0.4-0.3 % SOLN Place 1 drop into both eyes as needed (dry eyes).   Rivaroxaban (XARELTO) 15 MG TABS tablet Take 15 mg by mouth every other day.   venlafaxine XR (EFFEXOR-XR) 75 MG 24 hr capsule TAKE 1 CAPSULE BY MOUTH ONCE DAILY WITH  BREAKFAST   No facility-administered encounter medications on file as of 04/20/2022.    Allergies (verified) Atorvastatin, Budesonide, Gabapentin, Metoclopramide, Rosuvastatin, Simvastatin, Wellbutrin [bupropion], and Sulfamethoxazole-trimethoprim   History: Past Medical History:  Diagnosis Date   Anemia    Anxiety and depression    Arthritis    HANDS,ARMS,FEET   Asthma    Atrioventricular block    CAD (coronary artery disease)    CHF (congestive heart failure) (HCC)    Chronic renal insufficiency    Clotting disorder ([place])    Complication of anesthesia    Difficulty waking up once.   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 Otherwise normal colonoscopy   ESOPHAGOGASTRODUODENOSCOPY  11/16/2005   [place]. Patchy areas of mucosal erythema in the antrum compatible with gastritis. Polyps in the stomach. Nodule in the antrum. Medium hiatal hernia   ESOPHAGOGASTRODUODENOSCOPY (EGD) WITH PROPOFOL  N/A 04/13/2022   Procedure: ESOPHAGOGASTRODUODENOSCOPY (EGD) WITH PROPOFOL;  Surgeon: Jackquline Denmark, MD;  Location: [facility];  Service: Gastroenterology;  Laterality: N/A;   MALONEY DILATION  04/13/2022   Procedure: Venia Minks DILATION;  Surgeon: Jackquline Denmark, MD;  Location: [facility];  Service: Gastroenterology;;   pacemaker  09/27/2016   PACEMAKER REVISION  09/27/2020   PACEMAKER UPGRADE DUAL CHAMBER TO BIV   Family History  Problem Relation Age of Onset   High blood pressure Mother    Diabetes Mother        no medication needed   Kidney disease Mother    Pneumonia  Mother    Colon polyps Father    High blood pressure Father    Colon cancer Father    Cancer Maternal Grandmother        Leukemia   Cancer Other        Ovarian   Rectal cancer Neg Hx    Stomach cancer Neg Hx    Esophageal cancer Neg Hx    Liver cancer Neg Hx    Pancreatic cancer Neg Hx    Crohn's disease Neg Hx    Social History   Socioeconomic History   Marital status: Married    Spouse name: Darryl   Number of children: 3   Years of education: Not on file   Highest education level: 9th grade  Occupational History   Occupation: Retired  Tobacco Use   Smoking status: Never    Passive exposure: Past   Smokeless tobacco: Never  Vaping Use   Vaping Use: Never used  Substance and Sexual Activity   Alcohol use: Never   Drug use: Never   Sexual activity: Not on file  Other Topics Concern   Not on file  Social History Narrative   Lives at home with her husband   Right handed   Caffeine: mostly tea, 0-2 cups a day   Social Determinants of Health   Financial Resource Strain: Low Risk  (04/20/2022)   Overall Financial Resource Strain (CARDIA)    Difficulty of Paying Living Expenses: Not hard at all  Recent Concern: Financial Resource Strain - High Risk (04/19/2022)   Overall Financial Resource Strain (CARDIA)    Difficulty of Paying Living Expenses: Very hard  Food Insecurity: No Food Insecurity  (04/20/2022)   Hunger Vital Sign    Worried About Running Out of Food in the Last Year: Never true    [place] in the Last Year: Never true  Transportation Needs: No Transportation Needs (04/20/2022)   PRAPARE - Hydrologist (Medical): No    Lack of Transportation (Non-Medical): No  Physical Activity: Inactive (04/20/2022)   Exercise Vital Sign    Days of Exercise per Week: 0 days    Minutes of Exercise per Session: 0 min  Stress: No Stress Concern Present (04/20/2022)   [place]    Feeling of Stress : Only a little  Social Connections: Moderately Integrated (04/20/2022)   Social Connection and Isolation Panel [NHANES]    Frequency of Communication with Friends and Family: More than three times a week    Frequency of Social Gatherings with Friends and Family: More than three times a week    Attends Religious Services: 1 to 4 times per year    Active Member of [employer] or Organizations: No    Attends Archivist Meetings: Never    Marital Status: Married   Activities of Daily Living    04/20/2022    8:26 AM 04/20/2022    8:11 AM  In your present state of health, do you have any difficulty performing the following activities:  Hearing?  1  Vision?  1  Difficulty concentrating or making decisions?  0  Walking or climbing stairs?  1  Dressing or bathing? 0 1  Doing errands, shopping?  1  Preparing Food and eating ? N   Using the Toilet? N   In the past six months, have you accidently leaked urine?  Y  Do you have problems with loss of bowel control?  N  Managing your Medications?  N  Managing your Finances?  N  Housekeeping or managing your Housekeeping?  N    Patient Care Team: [name], MD as PCP - General (Family Medicine) Elayne Snare, MD as Consulting Physician (Endocrinology) Marice Potter, MD as Consulting Physician (Oncology) Flossie Buffy., MD as Referring  Physician (Cardiology) Jackquline Denmark, MD as Consulting Physician (Gastroenterology) [facility] (Ophthalmology) Lane Hacker, [facility] (Pharmacist)  Indicate any recent Medical Services you may have received from other than [facility] providers in the past year (date may be approximate).     Assessment:   This is a routine wellness examination for [place].  Hearing/Vision screen No results found.  Dietary issues and exercise activities discussed: Current Exercise Habits: Home exercise routine, Type of exercise: walking, Intensity: Mild, Exercise limited by: orthopedic condition(s)   Goals Addressed   None   Depression Screen    04/20/2022    8:22 AM 03/30/2022    8:22 AM 12/19/2021    9:22 AM 09/19/2021    1:33 PM 09/10/2021    5:30 PM 09/10/2021    5:28 PM 07/28/2021   10:38 AM  PHQ 2/9 Scores  PHQ - 2 Score 0 2 2 0 _0 PHQ- 9 Score _1 Fall Risk    04/20/2022    8:06 AM 05/03/2021    3:50 PM 05/03/2021    3:49 PM 02/21/2021    8:16 AM 01/13/2021   10:01 AM  Fall Risk   Falls in the past year? 1  0 1 0  Number falls in past yr: 1 0  0 0  Injury with Fall? 0 0  0 0  Risk for fall due to : History of fall(s);Impaired balance/gait;Impaired mobility No Fall Risks  Impaired balance/gait No Fall Risks  Follow up Falls prevention discussed Falls evaluation completed  Falls evaluation completed Falls evaluation completed;Falls prevention discussed    FALL RISK PREVENTION PERTAINING TO THE HOME:  Any stairs in or around the home? Yes  If so, are there any without handrails? Yes  Home free of loose throw rugs in walkways, pet beds, electrical cords, etc? Yes  Adequate lighting in your home to reduce risk of falls? Yes   ASSISTIVE DEVICES UTILIZED TO PREVENT FALLS:  Life alert? No  Use of a cane, walker or w/c? No  Grab bars in the bathroom? No  Shower chair or bench in shower? Yes  Elevated toilet seat or a handicapped toilet? No   TIMED UP AND  GO:  Was the test performed? Yes .  Length of time to ambulate 10 feet: 20 sec.   Gait slow and steady without use of assistive device  Cognitive Function:    10/22/2020    8:57 AM  MMSE - Mini Mental State Exam  Orientation to time 4  Orientation to Place 5  Registration 3  Attention/ Calculation 5  Recall 3  Language- name 2 objects 2  Language- repeat 1  Language- follow 3 step command 3  Language- read & follow direction 1  Write a sentence 1  Copy design 0  Total score 28        04/20/2022    8:13 AM 01/13/2021   11:06 AM 01/13/2020    9:47 AM  6CIT Screen  What Year? 0 points 0 points 0 points  What month? 0 points 0 points 0 points  What time? 0 points 0 points  0 points  Count back from 20 0 points 0 points 0 points  Months in reverse 0 points 2 points 2 points  Repeat phrase 0 points 2 points 2 points  Total Score 0 points 4 points 4 points    Immunizations Immunization History  Administered Date(s) Administered   Fluad Quad(high Dose 65+) 05/20/2020, 06/15/2021   Influenza, High Dose Seasonal PF 06/05/2017   Influenza,trivalent, recombinat, inj, PF 05/14/2012   Influenza-Unspecified 07/02/2014, 04/13/2015, 04/24/2016, 06/05/2017, 06/14/2018, 05/20/2020   PFIZER(Purple Top)SARS-COV-2 Vaccination 08/29/2019, 09/19/2019, 05/14/2020, 01/03/2021   Pneumococcal Conjugate-13 04/27/2014   Pneumococcal Polysaccharide-23 07/14/2010, 07/14/2010, 12/02/2015   Tdap 11/01/2017    TDAP status: Up to date  Flu Vaccine status: Declined, Education has been provided regarding the importance of this vaccine but patient still declined. Advised may receive this vaccine at local pharmacy or Health Dept. Aware to provide a copy of the vaccination record if obtained from local pharmacy or Health Dept. Verbalized acceptance and understanding.  Pneumococcal vaccine status: Up to date  Covid-19 vaccine status: Completed vaccines  Qualifies for Shingles Vaccine? No   Zostavax  completed No   Shingrix Completed?: No.    Education has been provided regarding the importance of this vaccine. Patient has been advised to call insurance company to determine out of pocket expense if they have not yet received this vaccine. Advised may also receive vaccine at local pharmacy or Health Dept. Verbalized acceptance and understanding.  Screening Tests Health Maintenance  Topic Date Due   Zoster Vaccines- Shingrix (1 of 2) Never done   OPHTHALMOLOGY EXAM  01/12/2022   COVID-19 Vaccine (5 - Pfizer risk series) 05/06/2022 (Originally 02/28/2021)   INFLUENZA VACCINE  11/12/2022 (Originally 03/14/2022)   HEMOGLOBIN A1C  09/30/2022   FOOT EXAM  03/31/2023   DEXA SCAN  04/02/2023   TETANUS/TDAP  11/02/2027   Pneumonia Vaccine 16+ Years old  Completed   Hepatitis C Screening  Completed   HPV VACCINES  Aged Out    Health Maintenance  Health Maintenance Due  Topic Date Due   Zoster Vaccines- Shingrix (1 of 2) Never done   OPHTHALMOLOGY EXAM  01/12/2022    Colorectal cancer screening: No longer required.   Mammogram status: Completed 06/06/2021. Repeat every year  Bone Density status: Completed 04/01/2021. Results reflect: Bone density results: OSTEOPOROSIS. Repeat every 2 years.  Lung Cancer Screening: (Low Dose CT Chest recommended if Age 105-80 years, 30 pack-year currently smoking OR have quit w/in 15years.) does not qualify.   Additional Screening:  Hepatitis C Screening: does not qualify; Completed 03/30/2022  Vision Screening: Recommended annual ophthalmology exams for early detection of glaucoma and other disorders of the eye. Is the patient up to date with their annual eye exam?  No  Who is the provider or what is the name of the office in which the patient attends annual eye exams? [place]   Dental Screening: Recommended annual dental exams for proper oral hygiene    Plan:     I have personally reviewed and noted the following in the patient's chart:    Medical and social history Use of alcohol, tobacco or illicit drugs  Current medications and supplements including opioid prescriptions. Patient is not currently taking opioid prescriptions. Functional ability and status Nutritional status Physical activity Advanced directives List of other physicians Hospitalizations, surgeries, and ER visits in previous 12 months Vitals Screenings to include cognitive, depression, and falls Referrals and appointments  In addition, I have reviewed and discussed with patient certain preventive  protocols, quality metrics, and best practice recommendations. A written personalized care plan for preventive services as well as general preventive health recommendations were provided to patient.     Augustin Coupe, CMA   04/20/2022   Nurse Notes: ***

## 2022-04-20 NOTE — Patient Instructions (Addendum)
Recommend chair exercises.  Return for flu shot in October.   Health Maintenance, Female Adopting a healthy lifestyle and getting preventive care are important in promoting health and wellness. Ask your health care provider about: The right schedule for you to have regular tests and exams. Things you can do on your own to prevent diseases and keep yourself healthy. What should I know about diet, weight, and exercise? Eat a healthy diet  Eat a diet that includes plenty of vegetables, fruits, low-fat dairy products, and lean protein. Do not eat a lot of foods that are high in solid fats, added sugars, or sodium. Maintain a healthy weight Body mass index (BMI) is used to identify weight problems. It estimates body fat based on height and weight. Your health care provider can help determine your BMI and help you achieve or maintain a healthy weight. Get regular exercise Get regular exercise. This is one of the most important things you can do for your health. Most adults should: Exercise for at least 150 minutes each week. The exercise should increase your heart rate and make you sweat (moderate-intensity exercise). Do strengthening exercises at least twice a week. This is in addition to the moderate-intensity exercise. Spend less time sitting. Even light physical activity can be beneficial. Watch cholesterol and blood lipids Have your blood tested for lipids and cholesterol at 79 years of age, then have this test every 5 years. Have your cholesterol levels checked more often if: Your lipid or cholesterol levels are high. You are older than 79 years of age. You are at high risk for heart disease. What should I know about cancer screening? Depending on your health history and family history, you may need to have cancer screening at various ages. This may include screening for: Breast cancer. Cervical cancer. Colorectal cancer. Skin cancer. Lung cancer. What should I know about heart  disease, diabetes, and high blood pressure? Blood pressure and heart disease High blood pressure causes heart disease and increases the risk of stroke. This is more likely to develop in people who have high blood pressure readings or are overweight. Have your blood pressure checked: Every 3-5 years if you are 39-30 years of age. Every year if you are 23 years old or older. Diabetes Have regular diabetes screenings. This checks your fasting blood sugar level. Have the screening done: Once every three years after age 53 if you are at a normal weight and have a low risk for diabetes. More often and at a younger age if you are overweight or have a high risk for diabetes. What should I know about preventing infection? Hepatitis B If you have a higher risk for hepatitis B, you should be screened for this virus. Talk with your health care provider to find out if you are at risk for hepatitis B infection. Hepatitis C Testing is recommended for: Everyone born from [birth year] through 1963. Anyone with known risk factors for hepatitis C. Sexually transmitted infections (STIs) Get screened for STIs, including gonorrhea and chlamydia, if: You are sexually active and are younger than 79 years of age. You are older than 79 years of age and your health care provider tells you that you are at risk for this type of infection. Your sexual activity has changed since you were last screened, and you are at increased risk for chlamydia or gonorrhea. Ask your health care provider if you are at risk. Ask your health care provider about whether you are at high risk for HIV. Your  health care provider may recommend a prescription medicine to help prevent HIV infection. If you choose to take medicine to prevent HIV, you should first get tested for HIV. You should then be tested every 3 months for as long as you are taking the medicine. Pregnancy If you are about to stop having your period (premenopausal) and you may become  pregnant, seek counseling before you get pregnant. Take 400 to 800 micrograms (mcg) of folic acid every day if you become pregnant. Ask for birth control (contraception) if you want to prevent pregnancy. Osteoporosis and menopause Osteoporosis is a disease in which the bones lose minerals and strength with aging. This can result in bone fractures. If you are 53 years old or older, or if you are at risk for osteoporosis and fractures, ask your health care provider if you should: Be screened for bone loss. Take a calcium or vitamin D supplement to lower your risk of fractures. Be given hormone replacement therapy (HRT) to treat symptoms of menopause. Follow these instructions at home: Alcohol use Do not drink alcohol if: Your health care provider tells you not to drink. You are pregnant, may be pregnant, or are planning to become pregnant. If you drink alcohol: Limit how much you have to: 0-1 drink a day. Know how much alcohol is in your drink. In the [place] one drink equals one 12 oz bottle of beer (355 mL), one 5 oz glass of wine (148 mL), or one 1 oz glass of hard liquor (44 mL). Lifestyle Do not use any products that contain nicotine or tobacco. These products include cigarettes, chewing tobacco, and vaping devices, such as e-cigarettes. If you need help quitting, ask your health care provider. Do not use street drugs. Do not share needles. Ask your health care provider for help if you need support or information about quitting drugs. General instructions Schedule regular health, dental, and eye exams. Stay current with your vaccines. Tell your health care provider if: You often feel depressed. You have ever been abused or do not feel safe at home. Summary Adopting a healthy lifestyle and getting preventive care are important in promoting health and wellness. Follow your health care provider's instructions about healthy diet, exercising, and getting tested or screened for  diseases. Follow your health care provider's instructions on monitoring your cholesterol and blood pressure. This information is not intended to replace advice given to you by your health care provider. Make sure you discuss any questions you have with your health care provider. Document Revised: 12/20/2020 Document Reviewed: 12/20/2020 Elsevier Patient Education  [place].

## 2022-04-21 LAB — COMPREHENSIVE METABOLIC PANEL
ALT: 15 IU/L (ref 0–32)
AST: 19 IU/L (ref 0–40)
Albumin/Globulin Ratio: 1.6 (ref 1.2–2.2)
Albumin: 3.5 g/dL — ABNORMAL LOW (ref 3.8–4.8)
Alkaline Phosphatase: 146 IU/L — ABNORMAL HIGH (ref 44–121)
BUN/Creatinine Ratio: 20 (ref 12–28)
BUN: 24 mg/dL (ref 8–27)
Bilirubin Total: 0.4 mg/dL (ref 0.0–1.2)
CO2: 20 mmol/L (ref 20–29)
Calcium: 9.1 mg/dL (ref 8.7–10.3)
Chloride: 103 mmol/L (ref 96–106)
Creatinine, Ser: 1.2 mg/dL — ABNORMAL HIGH (ref 0.57–1.00)
Globulin, Total: 2.2 g/dL (ref 1.5–4.5)
Glucose: 134 mg/dL — ABNORMAL HIGH (ref 70–99)
Potassium: 4.3 mmol/L (ref 3.5–5.2)
Sodium: 140 mmol/L (ref 134–144)
Total Protein: 5.7 g/dL — ABNORMAL LOW (ref 6.0–8.5)
eGFR: 46 mL/min/{1.73_m2} — ABNORMAL LOW (ref >59)

## 2022-04-21 LAB — TSH: TSH: 1.22 u[IU]/mL (ref 0.450–4.500)

## 2022-04-21 LAB — VITAMIN D 25 HYDROXY (VIT D DEFICIENCY, FRACTURES): Vit D, 25-Hydroxy: 38.9 ng/mL (ref 30.0–100.0)

## 2022-04-23 DIAGNOSIS — Z0000 Encounter for general adult medical examination without abnormal findings: Secondary | ICD-10-CM | POA: Insufficient documentation

## 2022-04-23 DIAGNOSIS — M810 Age-related osteoporosis without current pathological fracture: Secondary | ICD-10-CM | POA: Insufficient documentation

## 2022-04-23 NOTE — Assessment & Plan Note (Signed)
Education given.  Recommend chair exercises.  Return for flu shot in October.

## 2022-04-23 NOTE — Assessment & Plan Note (Signed)
Check labs 

## 2022-04-23 NOTE — Assessment & Plan Note (Signed)
Check vitamin D. 

## 2022-04-24 NOTE — Progress Notes (Unsigned)
Patient ID: Bridget [name], female   DOB: Jun 15, 1941, 81 y.o.   MRN: [id number]   Reason for Appointment: follow-up   History of Present Illness   Diagnosis: Type 2 DIABETES MELITUS, date of diagnosis:  2007     Previous history: Her A1c at diagnosis was 7.9% She has been treated with a combination of metformin ER and Actos for several years Usually her A1c has been upper normal and her last A1c was 5.8 in 4/14  Recent history:  Her A1c is higher last month at 7.4 compared to 6.6  Oral hypoglycemic drugs:   metformin  500 mg, 1 tablet a.m. and 1 tablets in the evening  She says her blood sugars have been higher for about a month and she does not know why  Again checking blood sugars only in the mornings despite reminders about taking it later in the day  She again is not taking Jardiance as recommended to the cardiologist but was told she may be able to get [place] on patient assistance  She is eating about the same and her weight is stable  No recent lab results available except in August She did not do much walking because of weakness and fatigue She is frequently eating cereal or oatmeal for breakfast as before  Generally she is reluctant to take any brand-name medication because of cost             Side effects from medications:  Abdominal discomfort from high-dose metformin       Monitors blood glucose:  once a day or less.    Glucometer: One Touch Ultra.           Blood Glucose readings from monitor review for the last 10 days:    PRE-MEAL Fasting Lunch Dinner Bedtime Overall  Glucose range: 129-187      Mean/median:     152   POST-MEAL PC Breakfast PC Lunch PC Dinner  Glucose range:     Mean/median:      Previously   PRE-MEAL Fasting Lunch Dinner Bedtime Overall  Glucose range: 113-155 81, 103     Mean/median: 125    119   POST-MEAL PC Breakfast PC Lunch PC Dinner  Glucose range:   ?  Mean/median:       Meals: 3 meals per day. Usually  follows a healthy diet, portions controlled             Dietician visit: Most recent: 2007            Wt Readings from Last 3 Encounters:  04/25/22 98 lb (44.5 kg)  04/20/22 99 lb (44.9 kg)  03/30/22 99 lb (44.9 kg)   Lab Results  Component Value Date   HGBA1C 7.4 (H) 03/30/2022   HGBA1C 6.6 (A) 10/26/2021   HGBA1C 6.5 (A) 07/26/2021   Lab Results  Component Value Date   MICROALBUR 4.0 (H) 03/25/2021   LDLCALC 114 (H) 03/30/2022   CREATININE 1.20 (H) 04/20/2022   Lab Results  Component Value Date   CREATININE 1.20 (H) 04/20/2022   CREATININE 1.23 (H) 03/30/2022   CREATININE 1.28 (H) 03/07/2022    Other active problems discussed: See review of systems   Allergies as of 04/25/2022       Reactions   Atorvastatin Other (See Comments)   "muscle Cramps"   Budesonide Other (See Comments)   "fatigue"   Gabapentin Other (See Comments)   "fatigue"   Metoclopramide Other (See Comments)   "mayalgia"  Rosuvastatin Other (See Comments)   "muscle cramps"   Simvastatin Other (See Comments)   "muscle Cramps"   Wellbutrin [bupropion]    Gi upset.    Sulfamethoxazole-trimethoprim Rash        Medication List        Accurate as of April 25, 2022 10:58 AM. If you have any questions, ask your nurse or doctor.          albuterol 108 (90 Base) MCG/ACT inhaler Commonly known as: VENTOLIN HFA INHALE 1 TO 2 PUFFS BY MOUTH EVERY 4 HOURS AS NEEDED FOR WHEEZING   aspirin 81 MG chewable tablet Chew 81 mg by mouth in the morning.   Breztri Aerosphere 160-9-4.8 MCG/ACT Aero Generic drug: Budeson-Glycopyrrol-Formoterol Inhale 2 puffs into the lungs 2 (two) times daily.   calcium-vitamin D 500-200 MG-UNIT Tabs tablet Commonly known as: OSCAL WITH D Take 1 tablet by mouth daily.   carvedilol 12.5 MG tablet Commonly known as: COREG Take 12.5 mg by mouth 2 (two) times daily with a meal.   CENTRUM SILVER ULTRA WOMENS PO Take 1 tablet by mouth daily.   colchicine 0.6  MG tablet Take 1 tablet (0.6 mg total) by mouth 2 (two) times daily.   furosemide 40 MG tablet Commonly known as: LASIX Take 40 mg by mouth daily.   losartan 25 MG tablet Commonly known as: COZAAR Take 25 mg by mouth daily.   lovastatin 40 MG tablet Commonly known as: MEVACOR Take 1 tablet (40 mg total) by mouth at bedtime.   Magnesium 250 MG Tabs Take 1 tablet (250 mg total) by mouth in the morning and at bedtime.   metFORMIN 500 MG tablet Commonly known as: GLUCOPHAGE TAKE 1 TABLET BY MOUTH TWICE DAILY WITH MORNING MEAL AND WITH EVENING MEAL   nitroGLYCERIN 0.4 MG SL tablet Commonly known as: NITROSTAT DISSOLVE ONE TABLET UNDER THE TONGUE EVERY 5 MINUTES AS NEEDED FOR CHEST PAIN.  DO NOT EXCEED A TOTAL OF 3 DOSES IN 15 MINUTES   omeprazole 20 MG capsule Commonly known as: PRILOSEC Take 1 capsule (20 mg total) by mouth daily.   ondansetron 4 MG tablet Commonly known as: Zofran Take 1 tablet (4 mg total) by mouth every 8 (eight) hours as needed for nausea or vomiting.   OneTouch Delica Lancets 09Q Misc USE 1  TO CHECK GLUCOSE ONCE DAILY *APPOINTMENT  REQUIRED  FOR  FUTURE  REFILLS   OneTouch Verio Flex System w/Device Kit Check sugar once daily   OneTouch Verio test strip Generic drug: glucose blood Use to check blood sugar once daily  DX CODE E11.9   Rivaroxaban 15 MG Tabs tablet Commonly known as: XARELTO Take 15 mg by mouth every other day.   Systane Ultra 0.4-0.3 % Soln Generic drug: Polyethyl Glycol-Propyl Glycol Place 1 drop into both eyes as needed (dry eyes).   venlafaxine XR 75 MG 24 hr capsule Commonly known as: EFFEXOR-XR TAKE 1 CAPSULE BY MOUTH ONCE DAILY WITH BREAKFAST   Vitamin D-3 25 MCG (1000 UT) Caps Take 1,000 Units by mouth daily.        Allergies:  Allergies  Allergen Reactions   Atorvastatin Other (See Comments)    "muscle Cramps"   Budesonide Other (See Comments)    "fatigue"   Gabapentin Other (See Comments)    "fatigue"    Metoclopramide Other (See Comments)    "mayalgia"   Rosuvastatin Other (See Comments)    "muscle cramps"   Simvastatin Other (See Comments)    "muscle  Cramps"   Wellbutrin [Bupropion]     Gi upset.    Sulfamethoxazole-Trimethoprim Rash    Past Medical History:  Diagnosis Date   Anemia    Anxiety and depression    Arthritis    HANDS,ARMS,FEET   Asthma    Atrioventricular block    CAD (coronary artery disease)    CHF (congestive heart failure) (HCC)    Chronic renal insufficiency    Clotting disorder (HCC)    Complication of anesthesia    Difficulty waking up once.   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 Otherwise normal colonoscopy   ESOPHAGOGASTRODUODENOSCOPY  11/16/2005   [place]. Patchy areas of mucosal erythema in the antrum compatible with gastritis. Polyps in the stomach. Nodule in the antrum. Medium hiatal hernia   ESOPHAGOGASTRODUODENOSCOPY (EGD) WITH PROPOFOL N/A 04/13/2022    Procedure: ESOPHAGOGASTRODUODENOSCOPY (EGD) WITH PROPOFOL;  Surgeon: Jackquline Denmark, MD;  Location: [facility];  Service: Gastroenterology;  Laterality: N/A;   MALONEY DILATION  04/13/2022   Procedure: Venia Minks DILATION;  Surgeon: Jackquline Denmark, MD;  Location: [facility];  Service: Gastroenterology;;   pacemaker  09/27/2016   PACEMAKER REVISION  09/27/2020   PACEMAKER UPGRADE DUAL CHAMBER TO BIV    Family History  Problem Relation Age of Onset   High blood pressure Mother    Diabetes Mother        no medication needed   Kidney disease Mother    Pneumonia Mother    Colon polyps Father    High blood pressure Father    Colon cancer Father    Cancer Maternal Grandmother        Leukemia   Cancer Other        Ovarian   Rectal cancer Neg Hx    Stomach cancer Neg Hx    Esophageal cancer Neg Hx    Liver cancer Neg Hx    Pancreatic cancer Neg Hx    Crohn's disease Neg Hx     Social History:  reports that she has never smoked. She has been exposed to tobacco smoke. She has never used smokeless tobacco. She reports that she does not drink alcohol and does not use drugs.  Review of Systems:  Hypertension:  blood pressure is treated with Coreg, amlodipine and, treated by other physicians She has a monitor at  home Home BP usually normal, may occasionally be below 462 systolic  She has a history of CAD followed by cardiologist  Lipids: She has had statin intolerance with at least atorvastatin, simvastatin and rosuvastatin She was prescribed lovastatin by her PCP, LDL is last improved  Lab Results  Component Value Date   CHOL 216 (H) 03/30/2022   CHOL 232 (H) 12/19/2021   CHOL 144 06/15/2021   Lab Results  Component Value Date   HDL 76 03/30/2022   HDL 71 12/19/2021   HDL 69 06/15/2021   Lab Results  Component Value Date   LDLCALC 114 (H) 03/30/2022   LDLCALC 130 (H) 12/19/2021   LDLCALC 58 06/15/2021   Lab Results  Component Value Date   TRIG 153 (H) 03/30/2022    TRIG 178 (H) 12/19/2021   TRIG 95 06/15/2021   Lab Results  Component Value Date   CHOLHDL 2.8 03/30/2022   CHOLHDL 3.3 12/19/2021   CHOLHDL 2.1 06/15/2021   Lab Results  Component Value Date   LDLDIRECT 94.2 12/05/2013     THYROID:  She was previously on thyroid suppression because of her long-standing benign thyroid nodule.  She has had 2 biopsies done on this previously Because of low normal TSH her 50 mcg Synthroid was stopped in 10/14 TSH has been consistently normal subsequently  She had a small left-sided thyroid nodule palpated on her visit in 12/20 and right thyroid nodule is relatively small  Her ultrasound last showed mostly cystic nodule in the right side about 1.6 cm  Lab Results  Component Value Date   TSH 1.220 04/20/2022   TSH 2.040 07/22/2021   TSH 1.64 03/23/2020   Last foot exam: 3/22    Examination:   BP 126/78 (BP Location: Left Arm, Patient Position: Sitting, Cuff Size: Normal)   Pulse (!) 49   Ht 4' 11"  (1.499 m)   Wt 98 lb (44.5 kg)   SpO2 92%   BMI 19.79 kg/m   Body mass index is 19.79 kg/m.   THYROID: Relatively small and flat nodule felt on the right medial lobe, about 1-1.5 cm, no nodule felt on the left   No lower leg edema present   ASSESSMENT/ PLAN:   Diabetes type 2 on metformin 500 mg twice a day only  A1c is 7.4 compared to 6.6  Appears to be  having some progression of her diabetes with generally higher readings although checked only in the mornings  She cannot tolerate higher doses of metformin Because of her heart failure we have been trying to get her to start her on an SGLT2 drug but still waiting message from the cardiologist  Will start 10 mg JARDIANCE as we can give her samples and her insurance coverage should be the same as with [place]  Will need to have her Lasix reduced to every other day with starting this and renal function monitored in the next 6 to 8 weeks She will also let the cardiologist know if her  blood pressure tends to be lower than usual Consider increasing the dose on the next visit to 25 mg  Discussed action of SGLT 2 drugs on lowering glucose by decreasing kidney absorption of glucose, benefits of cardiac function, effect on blood pressure, possible side effects including candidiasis      History of thyroid nodule: History of mostly cystic small stable right-sided nodule which is unchanged on exam    Patient Instructions  Check blood sugars on waking up 3 days a week  Also check blood sugars about 2 hours after meals and do this after different meals by rotation  Recommended blood sugar levels on waking up are 90-130 and about 2 hours after meal is 130-180  Please bring your blood sugar monitor to each visit, thank you  Jardiance 1 daily   Take 1/2 Lasix daily       Elayne Snare 04/25/2022, 10:58 AM

## 2022-04-25 ENCOUNTER — Encounter: Payer: Self-pay | Admitting: Endocrinology

## 2022-04-25 ENCOUNTER — Ambulatory Visit (INDEPENDENT_AMBULATORY_CARE_PROVIDER_SITE_OTHER): Payer: Medicare Other | Admitting: Endocrinology

## 2022-04-25 VITALS — BP 126/78 | HR 49 | Ht 59.0 in | Wt 98.0 lb

## 2022-04-25 DIAGNOSIS — E1165 Type 2 diabetes mellitus with hyperglycemia: Secondary | ICD-10-CM | POA: Diagnosis not present

## 2022-04-25 DIAGNOSIS — E041 Nontoxic single thyroid nodule: Secondary | ICD-10-CM | POA: Diagnosis not present

## 2022-04-25 NOTE — Patient Instructions (Addendum)
Check blood sugars on waking up 3 days a week  Also check blood sugars about 2 hours after meals and do this after different meals by rotation  Recommended blood sugar levels on waking up are 90-130 and about 2 hours after meal is 130-180  Please bring your blood sugar monitor to each visit, thank you  Jardiance 1 daily   Take 1/2 Lasix daily

## 2022-05-01 DIAGNOSIS — I447 Left bundle-branch block, unspecified: Secondary | ICD-10-CM | POA: Diagnosis not present

## 2022-05-01 DIAGNOSIS — M1990 Unspecified osteoarthritis, unspecified site: Secondary | ICD-10-CM | POA: Diagnosis not present

## 2022-05-01 DIAGNOSIS — E785 Hyperlipidemia, unspecified: Secondary | ICD-10-CM | POA: Diagnosis not present

## 2022-05-01 DIAGNOSIS — J80 Acute respiratory distress syndrome: Secondary | ICD-10-CM | POA: Diagnosis not present

## 2022-05-01 DIAGNOSIS — I480 Paroxysmal atrial fibrillation: Secondary | ICD-10-CM | POA: Diagnosis not present

## 2022-05-01 DIAGNOSIS — J90 Pleural effusion, not elsewhere classified: Secondary | ICD-10-CM | POA: Diagnosis not present

## 2022-05-01 DIAGNOSIS — E1122 Type 2 diabetes mellitus with diabetic chronic kidney disease: Secondary | ICD-10-CM | POA: Diagnosis not present

## 2022-05-01 DIAGNOSIS — N189 Chronic kidney disease, unspecified: Secondary | ICD-10-CM | POA: Diagnosis not present

## 2022-05-01 DIAGNOSIS — R0602 Shortness of breath: Secondary | ICD-10-CM | POA: Diagnosis not present

## 2022-05-01 DIAGNOSIS — I351 Nonrheumatic aortic (valve) insufficiency: Secondary | ICD-10-CM | POA: Diagnosis not present

## 2022-05-01 DIAGNOSIS — D631 Anemia in chronic kidney disease: Secondary | ICD-10-CM | POA: Diagnosis not present

## 2022-05-01 DIAGNOSIS — I2510 Atherosclerotic heart disease of native coronary artery without angina pectoris: Secondary | ICD-10-CM | POA: Diagnosis not present

## 2022-05-01 DIAGNOSIS — F32A Depression, unspecified: Secondary | ICD-10-CM | POA: Diagnosis not present

## 2022-05-01 DIAGNOSIS — J440 Chronic obstructive pulmonary disease with acute lower respiratory infection: Secondary | ICD-10-CM | POA: Diagnosis not present

## 2022-05-01 DIAGNOSIS — I340 Nonrheumatic mitral (valve) insufficiency: Secondary | ICD-10-CM | POA: Diagnosis not present

## 2022-05-01 DIAGNOSIS — F419 Anxiety disorder, unspecified: Secondary | ICD-10-CM | POA: Diagnosis not present

## 2022-05-01 DIAGNOSIS — R0902 Hypoxemia: Secondary | ICD-10-CM | POA: Diagnosis not present

## 2022-05-01 DIAGNOSIS — J9601 Acute respiratory failure with hypoxia: Secondary | ICD-10-CM | POA: Diagnosis not present

## 2022-05-01 DIAGNOSIS — J45909 Unspecified asthma, uncomplicated: Secondary | ICD-10-CM | POA: Diagnosis not present

## 2022-05-01 DIAGNOSIS — I5023 Acute on chronic systolic (congestive) heart failure: Secondary | ICD-10-CM | POA: Diagnosis not present

## 2022-05-01 DIAGNOSIS — R918 Other nonspecific abnormal finding of lung field: Secondary | ICD-10-CM | POA: Diagnosis not present

## 2022-05-01 DIAGNOSIS — I509 Heart failure, unspecified: Secondary | ICD-10-CM | POA: Diagnosis not present

## 2022-05-01 DIAGNOSIS — Z9981 Dependence on supplemental oxygen: Secondary | ICD-10-CM | POA: Diagnosis not present

## 2022-05-01 DIAGNOSIS — E039 Hypothyroidism, unspecified: Secondary | ICD-10-CM | POA: Diagnosis not present

## 2022-05-01 DIAGNOSIS — K219 Gastro-esophageal reflux disease without esophagitis: Secondary | ICD-10-CM | POA: Diagnosis not present

## 2022-05-01 DIAGNOSIS — R069 Unspecified abnormalities of breathing: Secondary | ICD-10-CM | POA: Diagnosis not present

## 2022-05-01 DIAGNOSIS — I428 Other cardiomyopathies: Secondary | ICD-10-CM | POA: Diagnosis not present

## 2022-05-01 DIAGNOSIS — R000 Tachycardia, unspecified: Secondary | ICD-10-CM | POA: Diagnosis not present

## 2022-05-01 DIAGNOSIS — I21A1 Myocardial infarction type 2: Secondary | ICD-10-CM | POA: Diagnosis not present

## 2022-05-01 DIAGNOSIS — Q2572 Congenital pulmonary arteriovenous malformation: Secondary | ICD-10-CM | POA: Diagnosis not present

## 2022-05-01 DIAGNOSIS — E872 Acidosis, unspecified: Secondary | ICD-10-CM | POA: Diagnosis not present

## 2022-05-01 DIAGNOSIS — Z86718 Personal history of other venous thrombosis and embolism: Secondary | ICD-10-CM | POA: Diagnosis not present

## 2022-05-01 DIAGNOSIS — I130 Hypertensive heart and chronic kidney disease with heart failure and stage 1 through stage 4 chronic kidney disease, or unspecified chronic kidney disease: Secondary | ICD-10-CM | POA: Diagnosis not present

## 2022-05-01 DIAGNOSIS — I472 Ventricular tachycardia, unspecified: Secondary | ICD-10-CM | POA: Diagnosis not present

## 2022-05-01 DIAGNOSIS — J189 Pneumonia, unspecified organism: Secondary | ICD-10-CM | POA: Diagnosis not present

## 2022-05-01 DIAGNOSIS — I493 Ventricular premature depolarization: Secondary | ICD-10-CM | POA: Diagnosis not present

## 2022-05-01 DIAGNOSIS — M109 Gout, unspecified: Secondary | ICD-10-CM | POA: Diagnosis not present

## 2022-05-01 DIAGNOSIS — R0689 Other abnormalities of breathing: Secondary | ICD-10-CM | POA: Diagnosis not present

## 2022-05-01 DIAGNOSIS — J441 Chronic obstructive pulmonary disease with (acute) exacerbation: Secondary | ICD-10-CM | POA: Diagnosis not present

## 2022-05-02 DIAGNOSIS — I447 Left bundle-branch block, unspecified: Secondary | ICD-10-CM

## 2022-05-03 DIAGNOSIS — I493 Ventricular premature depolarization: Secondary | ICD-10-CM

## 2022-05-04 DIAGNOSIS — I493 Ventricular premature depolarization: Secondary | ICD-10-CM | POA: Diagnosis not present

## 2022-05-07 ENCOUNTER — Other Ambulatory Visit: Payer: Self-pay | Admitting: Endocrinology

## 2022-05-07 MED ORDER — EMPAGLIFLOZIN 10 MG PO TABS: 10.0000 mg | ORAL_TABLET | Freq: Every day | ORAL | 3 refills | Status: DC

## 2022-05-08 ENCOUNTER — Encounter: Payer: Self-pay | Admitting: *Deleted

## 2022-05-08 ENCOUNTER — Telehealth: Payer: Self-pay | Admitting: *Deleted

## 2022-05-08 DIAGNOSIS — I519 Heart disease, unspecified: Secondary | ICD-10-CM | POA: Diagnosis not present

## 2022-05-08 DIAGNOSIS — R918 Other nonspecific abnormal finding of lung field: Secondary | ICD-10-CM | POA: Diagnosis not present

## 2022-05-08 DIAGNOSIS — I272 Pulmonary hypertension, unspecified: Secondary | ICD-10-CM | POA: Diagnosis not present

## 2022-05-08 DIAGNOSIS — R0609 Other forms of dyspnea: Secondary | ICD-10-CM | POA: Diagnosis not present

## 2022-05-08 DIAGNOSIS — I340 Nonrheumatic mitral (valve) insufficiency: Secondary | ICD-10-CM | POA: Diagnosis not present

## 2022-05-08 DIAGNOSIS — I10 Essential (primary) hypertension: Secondary | ICD-10-CM | POA: Diagnosis not present

## 2022-05-08 DIAGNOSIS — Z950 Presence of cardiac pacemaker: Secondary | ICD-10-CM | POA: Diagnosis not present

## 2022-05-08 DIAGNOSIS — Z794 Long term (current) use of insulin: Secondary | ICD-10-CM | POA: Diagnosis not present

## 2022-05-08 DIAGNOSIS — I110 Hypertensive heart disease with heart failure: Secondary | ICD-10-CM | POA: Diagnosis not present

## 2022-05-08 DIAGNOSIS — I442 Atrioventricular block, complete: Secondary | ICD-10-CM | POA: Diagnosis not present

## 2022-05-08 DIAGNOSIS — Z45018 Encounter for adjustment and management of other part of cardiac pacemaker: Secondary | ICD-10-CM | POA: Diagnosis not present

## 2022-05-08 DIAGNOSIS — I5020 Unspecified systolic (congestive) heart failure: Secondary | ICD-10-CM | POA: Diagnosis not present

## 2022-05-08 DIAGNOSIS — Z7901 Long term (current) use of anticoagulants: Secondary | ICD-10-CM | POA: Diagnosis not present

## 2022-05-08 DIAGNOSIS — E119 Type 2 diabetes mellitus without complications: Secondary | ICD-10-CM | POA: Diagnosis not present

## 2022-05-08 DIAGNOSIS — I428 Other cardiomyopathies: Secondary | ICD-10-CM | POA: Diagnosis not present

## 2022-05-08 NOTE — Patient Outreach (Signed)
  Care Coordination [facility] Note Transition Care Management Follow-up Telephone Call Date of discharge and from where: 9/23/23[facility]; difficulty breathing How have you been since you were released from the hospital? "I am doing better now; I couldn't breathe and had to go to the hospital; I am at the heart doctor's office now for my appointment; my husband and family are taking good care of me" Any questions or concerns? No  Items Reviewed: Did the pt receive and understand the discharge instructions provided? Yes  Medications obtained and verified? Yes  Other? No  Any new allergies since your discharge? No  Dietary orders reviewed? Yes Do you have support at home? Yes  husband assisting as indicated/ needed  Home Care and Equipment/Supplies: Were home health services ordered? no If so, what is the name of the agency? N/A  Has the agency set up a time to come to the patient's home? not applicable Were any new equipment or medical supplies ordered?  No What is the name of the medical supply agency? N/A Were you able to get the supplies/equipment? not applicable Do you have any questions related to the use of the equipment or supplies? No N/A  Functional Questionnaire: (I = Independent and D = Dependent) ADLs: I  husband assisting as indicated/ needed  Bathing/Dressing- I  Meal Prep- I  husband assisting as indicated/ needed  Eating- I  Maintaining continence- I  Transferring/Ambulation- I  Managing Meds- I  husband assisting as indicated/ needed  Follow up appointments reviewed:  PCP Hospital f/u appt confirmed? No  Scheduled to see - on - @ [facility] f/u appt confirmed? Yes  Scheduled to see "heart doctor" on 05/08/22 @ currently at office for scheduled visit Are transportation arrangements needed? No  If their condition worsens, is the pt aware to call PCP or go to the Emergency Dept.? Yes Was the patient provided with contact information for the PCP's  office or ED? Yes Was to pt encouraged to call back with questions or concerns? Yes  SDOH assessments and interventions completed:   Yes  Care Coordination Interventions Activated:  No   Care Coordination Interventions:  No Care Coordination interventions needed at this time.   Encounter Outcome:  Pt. Visit Completed    Oneta Rack, RN, BSN, CCRN Alumnus RN CM Care Coordination/ Transition of [place] Management [phone]: direct office

## 2022-05-12 DIAGNOSIS — R000 Tachycardia, unspecified: Secondary | ICD-10-CM | POA: Diagnosis not present

## 2022-05-12 DIAGNOSIS — I509 Heart failure, unspecified: Secondary | ICD-10-CM | POA: Diagnosis not present

## 2022-05-12 DIAGNOSIS — I519 Heart disease, unspecified: Secondary | ICD-10-CM | POA: Diagnosis not present

## 2022-05-13 DIAGNOSIS — I5030 Unspecified diastolic (congestive) heart failure: Secondary | ICD-10-CM | POA: Diagnosis not present

## 2022-05-13 DIAGNOSIS — I110 Hypertensive heart disease with heart failure: Secondary | ICD-10-CM

## 2022-05-13 DIAGNOSIS — E785 Hyperlipidemia, unspecified: Secondary | ICD-10-CM

## 2022-05-13 DIAGNOSIS — F32A Depression, unspecified: Secondary | ICD-10-CM | POA: Diagnosis not present

## 2022-05-13 DIAGNOSIS — J449 Chronic obstructive pulmonary disease, unspecified: Secondary | ICD-10-CM | POA: Diagnosis not present

## 2022-05-13 DIAGNOSIS — E1159 Type 2 diabetes mellitus with other circulatory complications: Secondary | ICD-10-CM | POA: Diagnosis not present

## 2022-05-22 DIAGNOSIS — I082 Rheumatic disorders of both aortic and tricuspid valves: Secondary | ICD-10-CM | POA: Diagnosis not present

## 2022-05-22 DIAGNOSIS — I501 Left ventricular failure: Secondary | ICD-10-CM | POA: Diagnosis not present

## 2022-05-22 DIAGNOSIS — I3139 Other pericardial effusion (noninflammatory): Secondary | ICD-10-CM | POA: Diagnosis not present

## 2022-05-22 DIAGNOSIS — I428 Other cardiomyopathies: Secondary | ICD-10-CM | POA: Diagnosis not present

## 2022-05-22 DIAGNOSIS — I5030 Unspecified diastolic (congestive) heart failure: Secondary | ICD-10-CM | POA: Diagnosis not present

## 2022-05-22 DIAGNOSIS — I083 Combined rheumatic disorders of mitral, aortic and tricuspid valves: Secondary | ICD-10-CM | POA: Diagnosis not present

## 2022-05-24 DIAGNOSIS — E1122 Type 2 diabetes mellitus with diabetic chronic kidney disease: Secondary | ICD-10-CM | POA: Diagnosis not present

## 2022-05-24 DIAGNOSIS — Z7901 Long term (current) use of anticoagulants: Secondary | ICD-10-CM | POA: Diagnosis not present

## 2022-05-24 DIAGNOSIS — R0902 Hypoxemia: Secondary | ICD-10-CM | POA: Diagnosis not present

## 2022-05-24 DIAGNOSIS — J90 Pleural effusion, not elsewhere classified: Secondary | ICD-10-CM | POA: Diagnosis not present

## 2022-05-24 DIAGNOSIS — E039 Hypothyroidism, unspecified: Secondary | ICD-10-CM | POA: Diagnosis not present

## 2022-05-24 DIAGNOSIS — R9431 Abnormal electrocardiogram [ECG] [EKG]: Secondary | ICD-10-CM | POA: Diagnosis not present

## 2022-05-24 DIAGNOSIS — R069 Unspecified abnormalities of breathing: Secondary | ICD-10-CM | POA: Diagnosis not present

## 2022-05-24 DIAGNOSIS — J9600 Acute respiratory failure, unspecified whether with hypoxia or hypercapnia: Secondary | ICD-10-CM | POA: Diagnosis not present

## 2022-05-24 DIAGNOSIS — Z882 Allergy status to sulfonamides status: Secondary | ICD-10-CM | POA: Diagnosis not present

## 2022-05-24 DIAGNOSIS — R0602 Shortness of breath: Secondary | ICD-10-CM | POA: Diagnosis not present

## 2022-05-24 DIAGNOSIS — F419 Anxiety disorder, unspecified: Secondary | ICD-10-CM | POA: Diagnosis not present

## 2022-05-24 DIAGNOSIS — R609 Edema, unspecified: Secondary | ICD-10-CM | POA: Diagnosis not present

## 2022-05-24 DIAGNOSIS — I130 Hypertensive heart and chronic kidney disease with heart failure and stage 1 through stage 4 chronic kidney disease, or unspecified chronic kidney disease: Secondary | ICD-10-CM | POA: Diagnosis not present

## 2022-05-24 DIAGNOSIS — Z881 Allergy status to other antibiotic agents status: Secondary | ICD-10-CM | POA: Diagnosis not present

## 2022-05-24 DIAGNOSIS — I4891 Unspecified atrial fibrillation: Secondary | ICD-10-CM | POA: Diagnosis not present

## 2022-05-24 DIAGNOSIS — I2510 Atherosclerotic heart disease of native coronary artery without angina pectoris: Secondary | ICD-10-CM | POA: Diagnosis not present

## 2022-05-24 DIAGNOSIS — Z7984 Long term (current) use of oral hypoglycemic drugs: Secondary | ICD-10-CM | POA: Diagnosis not present

## 2022-05-24 DIAGNOSIS — J449 Chronic obstructive pulmonary disease, unspecified: Secondary | ICD-10-CM | POA: Diagnosis not present

## 2022-05-24 DIAGNOSIS — R0689 Other abnormalities of breathing: Secondary | ICD-10-CM | POA: Diagnosis not present

## 2022-05-24 DIAGNOSIS — Z888 Allergy status to other drugs, medicaments and biological substances status: Secondary | ICD-10-CM | POA: Diagnosis not present

## 2022-05-24 DIAGNOSIS — Z7982 Long term (current) use of aspirin: Secondary | ICD-10-CM | POA: Diagnosis not present

## 2022-05-24 DIAGNOSIS — J441 Chronic obstructive pulmonary disease with (acute) exacerbation: Secondary | ICD-10-CM | POA: Diagnosis not present

## 2022-05-24 DIAGNOSIS — M1990 Unspecified osteoarthritis, unspecified site: Secondary | ICD-10-CM | POA: Diagnosis not present

## 2022-05-24 DIAGNOSIS — Z79899 Other long term (current) drug therapy: Secondary | ICD-10-CM | POA: Diagnosis not present

## 2022-05-24 DIAGNOSIS — J9601 Acute respiratory failure with hypoxia: Secondary | ICD-10-CM | POA: Diagnosis not present

## 2022-05-24 DIAGNOSIS — I5020 Unspecified systolic (congestive) heart failure: Secondary | ICD-10-CM | POA: Diagnosis not present

## 2022-05-24 DIAGNOSIS — Z950 Presence of cardiac pacemaker: Secondary | ICD-10-CM | POA: Diagnosis not present

## 2022-05-24 DIAGNOSIS — F32A Depression, unspecified: Secondary | ICD-10-CM | POA: Diagnosis not present

## 2022-05-24 DIAGNOSIS — M109 Gout, unspecified: Secondary | ICD-10-CM | POA: Diagnosis not present

## 2022-05-24 DIAGNOSIS — R918 Other nonspecific abnormal finding of lung field: Secondary | ICD-10-CM | POA: Diagnosis not present

## 2022-05-24 DIAGNOSIS — Q2572 Congenital pulmonary arteriovenous malformation: Secondary | ICD-10-CM | POA: Diagnosis not present

## 2022-05-24 DIAGNOSIS — I5023 Acute on chronic systolic (congestive) heart failure: Secondary | ICD-10-CM | POA: Diagnosis not present

## 2022-05-25 ENCOUNTER — Telehealth: Payer: Self-pay

## 2022-05-25 DIAGNOSIS — R000 Tachycardia, unspecified: Secondary | ICD-10-CM | POA: Diagnosis not present

## 2022-05-25 NOTE — Progress Notes (Signed)
Chronic Care Management Pharmacy Assistant   Name: Bridget [name]  MRN: [id number] DOB: 1941-03-22   Reason for Encounter: Disease State call for DM   Recent office visits:  04/20/22 Bridget Brome MD. Seen for Medicare Annual Wellness. NO med changes.  Recent consult visits:  05/08/22 (Cardiology) Bridget Douglas NP. Seen for Heart failure with reduced ejection fraction. Ordered Entresto 24-25m.   04/25/22 (Endocrinology) Bridget SnareMD.Telephone message. You will be taking Jardiance in the morning and metformin at the same dose as before  04/25/22 (Endocrinology) Bridget SnareMD. Seen for DM. Jardiance 1 daily Take 1/2 Lasix daily.  Hospital visits:  None  Medications: Outpatient Encounter Medications as of 05/25/2022  Medication Sig Note   albuterol (VENTOLIN HFA) 108 (90 Base) MCG/ACT inhaler INHALE 1 TO 2 PUFFS BY MOUTH EVERY 4 HOURS AS NEEDED FOR WHEEZING 04/07/2022: Uses 2 times daily    aspirin 81 MG chewable tablet Chew 81 mg by mouth in the morning.     Blood Glucose Monitoring Suppl (ONETOUCH VERIO FLEX SYSTEM) w/Device KIT Check sugar once daily    Budeson-Glycopyrrol-Formoterol (BREZTRI AEROSPHERE) 160-9-4.8 MCG/ACT AERO Inhale 2 puffs into the lungs 2 (two) times daily.    calcium-vitamin D (OSCAL WITH D) 500-200 MG-UNIT TABS tablet Take 1 tablet by mouth daily.    carvedilol (COREG) 12.5 MG tablet Take 12.5 mg by mouth 2 (two) times daily with a meal.    Cholecalciferol (VITAMIN D-3) 1000 UNITS CAPS Take 1,000 Units by mouth daily.    colchicine 0.6 MG tablet Take 1 tablet (0.6 mg total) by mouth 2 (two) times daily.    empagliflozin (JARDIANCE) 10 MG TABS tablet Take 1 tablet (10 mg total) by mouth daily with breakfast.    furosemide (LASIX) 40 MG tablet Take 40 mg by mouth daily.    losartan (COZAAR) 25 MG tablet Take 25 mg by mouth daily.    lovastatin (MEVACOR) 40 MG tablet Take 1 tablet (40 mg total) by mouth at bedtime.    Magnesium 250 MG TABS  Take 1 tablet (250 mg total) by mouth in the morning and at bedtime.    metFORMIN (GLUCOPHAGE) 500 MG tablet TAKE 1 TABLET BY MOUTH TWICE DAILY WITH MORNING MEAL AND WITH EVENING MEAL    Multiple Vitamins-Minerals (CENTRUM SILVER ULTRA WOMENS PO) Take 1 tablet by mouth daily.    nitroGLYCERIN (NITROSTAT) 0.4 MG SL tablet DISSOLVE ONE TABLET UNDER THE TONGUE EVERY 5 MINUTES AS NEEDED FOR CHEST PAIN.  DO NOT EXCEED A TOTAL OF 3 DOSES IN 15 MINUTES    omeprazole (PRILOSEC) 20 MG capsule Take 1 capsule (20 mg total) by mouth daily.    ondansetron (ZOFRAN) 4 MG tablet Take 1 tablet (4 mg total) by mouth every 8 (eight) hours as needed for nausea or vomiting.    OneTouch Delica Lancets 351OMISC USE 1  TO CHECK GLUCOSE ONCE DAILY *APPOINTMENT  REQUIRED  FOR  FUTURE  REFILLS    ONETOUCH VERIO test strip Use to check blood sugar once daily  DX CODE E11.9    Polyethyl Glycol-Propyl Glycol (SYSTANE ULTRA) 0.4-0.3 % SOLN Place 1 drop into both eyes as needed (dry eyes).    Rivaroxaban (XARELTO) 15 MG TABS tablet Take 15 mg by mouth every other day.    venlafaxine XR (EFFEXOR-XR) 75 MG 24 hr capsule TAKE 1 CAPSULE BY MOUTH ONCE DAILY WITH BREAKFAST    No facility-administered encounter medications on file as of 05/25/2022.    Recent  Relevant Labs: Lab Results  Component Value Date/Time   HGBA1C 7.4 (H) 03/30/2022 08:56 AM   HGBA1C 6.6 (A) 10/26/2021 08:33 AM   HGBA1C 6.5 (A) 07/26/2021 08:30 AM   HGBA1C 6.7 (H) 10/22/2020 09:01 AM   MICROALBUR 4.0 (H) 03/25/2021 08:59 AM   MICROALBUR <0.7 03/23/2020 10:49 AM    Kidney Function Lab Results  Component Value Date/Time   CREATININE 1.20 (H) 04/20/2022 08:56 AM   CREATININE 1.23 (H) 03/30/2022 08:56 AM   GFR 32.91 (L) 03/23/2020 08:52 AM   GFRNONAA 60 09/08/2020 02:42 PM   GFRAA 69 09/08/2020 02:42 PM     Current antihyperglycemic regimen:  Jardiance 27m daily Metformin 5087mtwice daily  Patient verbally confirms she is taking the above  medications as directed. Yes  What recent interventions/DTPs have been made to improve glycemic control:  No changes  Have there been any recent hospitalizations or ED visits since last visit with CPP? Yes, [facility] for CHF, fluid around her heart and lungs.   Patient denies hypoglycemic symptoms, including None  Patient denies hyperglycemic symptoms, including none  How often are you checking your blood sugar? in the morning before eating or drinking  What are your blood sugars ranging?  Fasting: 06/12/22 127, 06/11/22 136, 06/10/22 155, 06/09/22 138, 06/08/22 115  On insulin? No  During the week, how often does your blood glucose drop below 70? Never  Are you checking your feet daily/regularly? Yes  Adherence Review: Is the patient currently on a STATIN medication? Yes Is the patient currently on ACE/ARB medication? Yes Does the patient have >5 day gap between last estimated fill dates? CPP to review  Care Gaps: Last eye exam / Retinopathy Screening? 01/12/21 Last Annual Wellness Visit? 04/20/22 Last Diabetic Foot Exam? 03/30/22   Star Rating Drugs: Pt was in the hospital for 2 weeks Medication:  Last Fill: Day Supply Metformin  03/02/22-12/02/21 90ds Jardiance   05/08/22 30[place]CMWilming[place] Islandharmacist Assistant  [phone]

## 2022-05-26 DIAGNOSIS — E785 Hyperlipidemia, unspecified: Secondary | ICD-10-CM | POA: Diagnosis not present

## 2022-05-26 DIAGNOSIS — F329 Major depressive disorder, single episode, unspecified: Secondary | ICD-10-CM | POA: Diagnosis not present

## 2022-05-26 DIAGNOSIS — I5023 Acute on chronic systolic (congestive) heart failure: Secondary | ICD-10-CM | POA: Diagnosis not present

## 2022-05-26 DIAGNOSIS — J440 Chronic obstructive pulmonary disease with acute lower respiratory infection: Secondary | ICD-10-CM | POA: Diagnosis not present

## 2022-05-26 DIAGNOSIS — J90 Pleural effusion, not elsewhere classified: Secondary | ICD-10-CM | POA: Diagnosis not present

## 2022-05-26 DIAGNOSIS — J189 Pneumonia, unspecified organism: Secondary | ICD-10-CM | POA: Diagnosis not present

## 2022-05-26 DIAGNOSIS — J449 Chronic obstructive pulmonary disease, unspecified: Secondary | ICD-10-CM | POA: Diagnosis not present

## 2022-05-26 DIAGNOSIS — J9811 Atelectasis: Secondary | ICD-10-CM | POA: Diagnosis not present

## 2022-05-26 DIAGNOSIS — M1990 Unspecified osteoarthritis, unspecified site: Secondary | ICD-10-CM | POA: Diagnosis not present

## 2022-05-26 DIAGNOSIS — J9690 Respiratory failure, unspecified, unspecified whether with hypoxia or hypercapnia: Secondary | ICD-10-CM | POA: Diagnosis not present

## 2022-05-26 DIAGNOSIS — I517 Cardiomegaly: Secondary | ICD-10-CM | POA: Diagnosis not present

## 2022-05-26 DIAGNOSIS — J9601 Acute respiratory failure with hypoxia: Secondary | ICD-10-CM | POA: Diagnosis not present

## 2022-05-26 DIAGNOSIS — Z794 Long term (current) use of insulin: Secondary | ICD-10-CM | POA: Diagnosis not present

## 2022-05-26 DIAGNOSIS — R600 Localized edema: Secondary | ICD-10-CM | POA: Diagnosis not present

## 2022-05-26 DIAGNOSIS — I4891 Unspecified atrial fibrillation: Secondary | ICD-10-CM | POA: Diagnosis not present

## 2022-05-26 DIAGNOSIS — I428 Other cardiomyopathies: Secondary | ICD-10-CM | POA: Diagnosis not present

## 2022-05-26 DIAGNOSIS — E8779 Other fluid overload: Secondary | ICD-10-CM | POA: Diagnosis not present

## 2022-05-26 DIAGNOSIS — E119 Type 2 diabetes mellitus without complications: Secondary | ICD-10-CM | POA: Diagnosis not present

## 2022-05-26 DIAGNOSIS — Z7901 Long term (current) use of anticoagulants: Secondary | ICD-10-CM | POA: Diagnosis not present

## 2022-05-26 DIAGNOSIS — R410 Disorientation, unspecified: Secondary | ICD-10-CM | POA: Diagnosis not present

## 2022-05-26 DIAGNOSIS — Z79899 Other long term (current) drug therapy: Secondary | ICD-10-CM | POA: Diagnosis not present

## 2022-05-26 DIAGNOSIS — Q2572 Congenital pulmonary arteriovenous malformation: Secondary | ICD-10-CM | POA: Diagnosis not present

## 2022-05-26 DIAGNOSIS — I10 Essential (primary) hypertension: Secondary | ICD-10-CM | POA: Diagnosis not present

## 2022-05-26 DIAGNOSIS — R7881 Bacteremia: Secondary | ICD-10-CM | POA: Diagnosis not present

## 2022-05-26 DIAGNOSIS — Z743 Need for continuous supervision: Secondary | ICD-10-CM | POA: Diagnosis not present

## 2022-05-26 DIAGNOSIS — I340 Nonrheumatic mitral (valve) insufficiency: Secondary | ICD-10-CM | POA: Diagnosis not present

## 2022-05-26 DIAGNOSIS — I2510 Atherosclerotic heart disease of native coronary artery without angina pectoris: Secondary | ICD-10-CM | POA: Diagnosis not present

## 2022-05-26 DIAGNOSIS — Z86718 Personal history of other venous thrombosis and embolism: Secondary | ICD-10-CM | POA: Diagnosis not present

## 2022-05-26 DIAGNOSIS — I5021 Acute systolic (congestive) heart failure: Secondary | ICD-10-CM | POA: Diagnosis not present

## 2022-05-26 DIAGNOSIS — I509 Heart failure, unspecified: Secondary | ICD-10-CM | POA: Diagnosis not present

## 2022-05-26 DIAGNOSIS — M109 Gout, unspecified: Secondary | ICD-10-CM | POA: Diagnosis not present

## 2022-05-26 DIAGNOSIS — R531 Weakness: Secondary | ICD-10-CM | POA: Diagnosis not present

## 2022-05-26 DIAGNOSIS — Z452 Encounter for adjustment and management of vascular access device: Secondary | ICD-10-CM | POA: Diagnosis not present

## 2022-05-26 DIAGNOSIS — I130 Hypertensive heart and chronic kidney disease with heart failure and stage 1 through stage 4 chronic kidney disease, or unspecified chronic kidney disease: Secondary | ICD-10-CM | POA: Diagnosis not present

## 2022-05-26 DIAGNOSIS — Z7189 Other specified counseling: Secondary | ICD-10-CM | POA: Diagnosis not present

## 2022-05-26 DIAGNOSIS — R079 Chest pain, unspecified: Secondary | ICD-10-CM | POA: Diagnosis not present

## 2022-05-26 DIAGNOSIS — I442 Atrioventricular block, complete: Secondary | ICD-10-CM | POA: Diagnosis not present

## 2022-05-26 DIAGNOSIS — I110 Hypertensive heart disease with heart failure: Secondary | ICD-10-CM | POA: Diagnosis not present

## 2022-05-26 DIAGNOSIS — Z9889 Other specified postprocedural states: Secondary | ICD-10-CM | POA: Diagnosis not present

## 2022-05-26 DIAGNOSIS — F330 Major depressive disorder, recurrent, mild: Secondary | ICD-10-CM | POA: Diagnosis not present

## 2022-05-26 DIAGNOSIS — B9561 Methicillin susceptible Staphylococcus aureus infection as the cause of diseases classified elsewhere: Secondary | ICD-10-CM | POA: Diagnosis not present

## 2022-05-26 DIAGNOSIS — R918 Other nonspecific abnormal finding of lung field: Secondary | ICD-10-CM | POA: Diagnosis not present

## 2022-05-26 DIAGNOSIS — Z515 Encounter for palliative care: Secondary | ICD-10-CM | POA: Diagnosis not present

## 2022-05-26 DIAGNOSIS — Z950 Presence of cardiac pacemaker: Secondary | ICD-10-CM | POA: Diagnosis not present

## 2022-05-26 DIAGNOSIS — J181 Lobar pneumonia, unspecified organism: Secondary | ICD-10-CM | POA: Diagnosis not present

## 2022-05-26 LAB — HEMOGLOBIN A1C: Hemoglobin A1C: 7.4

## 2022-05-27 DIAGNOSIS — I10 Essential (primary) hypertension: Secondary | ICD-10-CM | POA: Diagnosis not present

## 2022-05-27 DIAGNOSIS — E785 Hyperlipidemia, unspecified: Secondary | ICD-10-CM | POA: Diagnosis not present

## 2022-05-27 DIAGNOSIS — E119 Type 2 diabetes mellitus without complications: Secondary | ICD-10-CM | POA: Diagnosis not present

## 2022-05-27 DIAGNOSIS — Z950 Presence of cardiac pacemaker: Secondary | ICD-10-CM | POA: Diagnosis not present

## 2022-05-27 DIAGNOSIS — J189 Pneumonia, unspecified organism: Secondary | ICD-10-CM | POA: Diagnosis not present

## 2022-05-27 DIAGNOSIS — I110 Hypertensive heart disease with heart failure: Secondary | ICD-10-CM | POA: Diagnosis not present

## 2022-05-27 DIAGNOSIS — Z79899 Other long term (current) drug therapy: Secondary | ICD-10-CM | POA: Diagnosis not present

## 2022-05-27 DIAGNOSIS — E8779 Other fluid overload: Secondary | ICD-10-CM | POA: Diagnosis not present

## 2022-05-27 DIAGNOSIS — I340 Nonrheumatic mitral (valve) insufficiency: Secondary | ICD-10-CM | POA: Diagnosis not present

## 2022-05-27 DIAGNOSIS — I5023 Acute on chronic systolic (congestive) heart failure: Secondary | ICD-10-CM | POA: Diagnosis not present

## 2022-05-27 DIAGNOSIS — I428 Other cardiomyopathies: Secondary | ICD-10-CM | POA: Diagnosis not present

## 2022-05-27 DIAGNOSIS — R918 Other nonspecific abnormal finding of lung field: Secondary | ICD-10-CM | POA: Diagnosis not present

## 2022-05-27 DIAGNOSIS — J9601 Acute respiratory failure with hypoxia: Secondary | ICD-10-CM | POA: Diagnosis not present

## 2022-05-27 DIAGNOSIS — R7881 Bacteremia: Secondary | ICD-10-CM | POA: Diagnosis not present

## 2022-05-27 DIAGNOSIS — J90 Pleural effusion, not elsewhere classified: Secondary | ICD-10-CM | POA: Diagnosis not present

## 2022-05-28 DIAGNOSIS — J90 Pleural effusion, not elsewhere classified: Secondary | ICD-10-CM | POA: Diagnosis not present

## 2022-05-28 DIAGNOSIS — I5023 Acute on chronic systolic (congestive) heart failure: Secondary | ICD-10-CM | POA: Diagnosis not present

## 2022-05-28 DIAGNOSIS — E119 Type 2 diabetes mellitus without complications: Secondary | ICD-10-CM | POA: Diagnosis not present

## 2022-05-28 DIAGNOSIS — Z950 Presence of cardiac pacemaker: Secondary | ICD-10-CM | POA: Diagnosis not present

## 2022-05-28 DIAGNOSIS — E785 Hyperlipidemia, unspecified: Secondary | ICD-10-CM | POA: Diagnosis not present

## 2022-05-28 DIAGNOSIS — I428 Other cardiomyopathies: Secondary | ICD-10-CM | POA: Diagnosis not present

## 2022-05-28 DIAGNOSIS — E8779 Other fluid overload: Secondary | ICD-10-CM | POA: Diagnosis not present

## 2022-05-28 DIAGNOSIS — R7881 Bacteremia: Secondary | ICD-10-CM | POA: Diagnosis not present

## 2022-05-28 DIAGNOSIS — R918 Other nonspecific abnormal finding of lung field: Secondary | ICD-10-CM | POA: Diagnosis not present

## 2022-05-28 DIAGNOSIS — J9601 Acute respiratory failure with hypoxia: Secondary | ICD-10-CM | POA: Diagnosis not present

## 2022-05-28 DIAGNOSIS — I340 Nonrheumatic mitral (valve) insufficiency: Secondary | ICD-10-CM | POA: Diagnosis not present

## 2022-05-28 DIAGNOSIS — Z79899 Other long term (current) drug therapy: Secondary | ICD-10-CM | POA: Diagnosis not present

## 2022-05-28 DIAGNOSIS — I110 Hypertensive heart disease with heart failure: Secondary | ICD-10-CM | POA: Diagnosis not present

## 2022-05-28 DIAGNOSIS — I10 Essential (primary) hypertension: Secondary | ICD-10-CM | POA: Diagnosis not present

## 2022-05-29 DIAGNOSIS — E119 Type 2 diabetes mellitus without complications: Secondary | ICD-10-CM | POA: Diagnosis not present

## 2022-05-29 DIAGNOSIS — Z7189 Other specified counseling: Secondary | ICD-10-CM | POA: Diagnosis not present

## 2022-05-29 DIAGNOSIS — J9601 Acute respiratory failure with hypoxia: Secondary | ICD-10-CM | POA: Diagnosis not present

## 2022-05-29 DIAGNOSIS — R7881 Bacteremia: Secondary | ICD-10-CM | POA: Diagnosis not present

## 2022-05-29 DIAGNOSIS — I10 Essential (primary) hypertension: Secondary | ICD-10-CM | POA: Diagnosis not present

## 2022-05-29 DIAGNOSIS — I5023 Acute on chronic systolic (congestive) heart failure: Secondary | ICD-10-CM | POA: Diagnosis not present

## 2022-05-29 DIAGNOSIS — J90 Pleural effusion, not elsewhere classified: Secondary | ICD-10-CM | POA: Diagnosis not present

## 2022-05-29 DIAGNOSIS — R410 Disorientation, unspecified: Secondary | ICD-10-CM | POA: Diagnosis not present

## 2022-05-29 DIAGNOSIS — Z950 Presence of cardiac pacemaker: Secondary | ICD-10-CM | POA: Diagnosis not present

## 2022-05-29 DIAGNOSIS — I340 Nonrheumatic mitral (valve) insufficiency: Secondary | ICD-10-CM | POA: Diagnosis not present

## 2022-05-29 DIAGNOSIS — Z515 Encounter for palliative care: Secondary | ICD-10-CM | POA: Diagnosis not present

## 2022-05-29 DIAGNOSIS — R918 Other nonspecific abnormal finding of lung field: Secondary | ICD-10-CM | POA: Diagnosis not present

## 2022-05-29 DIAGNOSIS — B9561 Methicillin susceptible Staphylococcus aureus infection as the cause of diseases classified elsewhere: Secondary | ICD-10-CM | POA: Diagnosis not present

## 2022-05-29 DIAGNOSIS — I517 Cardiomegaly: Secondary | ICD-10-CM | POA: Diagnosis not present

## 2022-05-30 ENCOUNTER — Telehealth: Payer: Self-pay

## 2022-05-30 DIAGNOSIS — R079 Chest pain, unspecified: Secondary | ICD-10-CM | POA: Diagnosis not present

## 2022-05-30 DIAGNOSIS — I5023 Acute on chronic systolic (congestive) heart failure: Secondary | ICD-10-CM | POA: Diagnosis not present

## 2022-05-30 DIAGNOSIS — J9601 Acute respiratory failure with hypoxia: Secondary | ICD-10-CM | POA: Diagnosis not present

## 2022-05-30 DIAGNOSIS — Z7189 Other specified counseling: Secondary | ICD-10-CM | POA: Diagnosis not present

## 2022-05-30 DIAGNOSIS — J181 Lobar pneumonia, unspecified organism: Secondary | ICD-10-CM | POA: Diagnosis not present

## 2022-05-30 DIAGNOSIS — I517 Cardiomegaly: Secondary | ICD-10-CM | POA: Diagnosis not present

## 2022-05-30 DIAGNOSIS — R410 Disorientation, unspecified: Secondary | ICD-10-CM | POA: Diagnosis not present

## 2022-05-30 DIAGNOSIS — Z950 Presence of cardiac pacemaker: Secondary | ICD-10-CM | POA: Diagnosis not present

## 2022-05-30 DIAGNOSIS — I340 Nonrheumatic mitral (valve) insufficiency: Secondary | ICD-10-CM | POA: Diagnosis not present

## 2022-05-30 DIAGNOSIS — J90 Pleural effusion, not elsewhere classified: Secondary | ICD-10-CM | POA: Diagnosis not present

## 2022-05-30 DIAGNOSIS — B9561 Methicillin susceptible Staphylococcus aureus infection as the cause of diseases classified elsewhere: Secondary | ICD-10-CM | POA: Diagnosis not present

## 2022-05-30 DIAGNOSIS — R7881 Bacteremia: Secondary | ICD-10-CM | POA: Diagnosis not present

## 2022-05-30 DIAGNOSIS — Z515 Encounter for palliative care: Secondary | ICD-10-CM | POA: Diagnosis not present

## 2022-05-30 NOTE — Telephone Encounter (Signed)
    Reason for call: ED-Follow up call   Patient visited [facility] on 05/26/2022   Telephone encounter attempt :  1st attempt  A HIPAA compliant voice message was left requesting a return call.  Instructed patient to call back at [phone] at their earliest convenience.  [place] management  [place], [place]  Main Phone: [phone]  E-mail: [email or web address]  Website: [email or web address]

## 2022-05-31 DIAGNOSIS — I340 Nonrheumatic mitral (valve) insufficiency: Secondary | ICD-10-CM | POA: Diagnosis not present

## 2022-05-31 DIAGNOSIS — J9601 Acute respiratory failure with hypoxia: Secondary | ICD-10-CM | POA: Diagnosis not present

## 2022-05-31 DIAGNOSIS — I5023 Acute on chronic systolic (congestive) heart failure: Secondary | ICD-10-CM | POA: Diagnosis not present

## 2022-05-31 DIAGNOSIS — J90 Pleural effusion, not elsewhere classified: Secondary | ICD-10-CM | POA: Diagnosis not present

## 2022-05-31 DIAGNOSIS — J181 Lobar pneumonia, unspecified organism: Secondary | ICD-10-CM | POA: Diagnosis not present

## 2022-05-31 DIAGNOSIS — B9561 Methicillin susceptible Staphylococcus aureus infection as the cause of diseases classified elsewhere: Secondary | ICD-10-CM | POA: Diagnosis not present

## 2022-05-31 DIAGNOSIS — R7881 Bacteremia: Secondary | ICD-10-CM | POA: Diagnosis not present

## 2022-05-31 DIAGNOSIS — Z950 Presence of cardiac pacemaker: Secondary | ICD-10-CM | POA: Diagnosis not present

## 2022-05-31 DIAGNOSIS — I517 Cardiomegaly: Secondary | ICD-10-CM | POA: Diagnosis not present

## 2022-06-01 DIAGNOSIS — F419 Anxiety disorder, unspecified: Secondary | ICD-10-CM | POA: Diagnosis not present

## 2022-06-01 DIAGNOSIS — I700 Atherosclerosis of aorta: Secondary | ICD-10-CM | POA: Diagnosis not present

## 2022-06-01 DIAGNOSIS — N189 Chronic kidney disease, unspecified: Secondary | ICD-10-CM | POA: Diagnosis not present

## 2022-06-01 DIAGNOSIS — R531 Weakness: Secondary | ICD-10-CM | POA: Diagnosis not present

## 2022-06-01 DIAGNOSIS — Z950 Presence of cardiac pacemaker: Secondary | ICD-10-CM | POA: Diagnosis not present

## 2022-06-01 DIAGNOSIS — M79671 Pain in right foot: Secondary | ICD-10-CM | POA: Diagnosis not present

## 2022-06-01 DIAGNOSIS — Z7984 Long term (current) use of oral hypoglycemic drugs: Secondary | ICD-10-CM | POA: Diagnosis not present

## 2022-06-01 DIAGNOSIS — E871 Hypo-osmolality and hyponatremia: Secondary | ICD-10-CM | POA: Diagnosis not present

## 2022-06-01 DIAGNOSIS — R5381 Other malaise: Secondary | ICD-10-CM | POA: Diagnosis not present

## 2022-06-01 DIAGNOSIS — R7881 Bacteremia: Secondary | ICD-10-CM | POA: Diagnosis not present

## 2022-06-01 DIAGNOSIS — Z888 Allergy status to other drugs, medicaments and biological substances status: Secondary | ICD-10-CM | POA: Diagnosis not present

## 2022-06-01 DIAGNOSIS — I5023 Acute on chronic systolic (congestive) heart failure: Secondary | ICD-10-CM | POA: Diagnosis not present

## 2022-06-01 DIAGNOSIS — I517 Cardiomegaly: Secondary | ICD-10-CM | POA: Diagnosis not present

## 2022-06-01 DIAGNOSIS — L03115 Cellulitis of right lower limb: Secondary | ICD-10-CM | POA: Diagnosis not present

## 2022-06-01 DIAGNOSIS — E1122 Type 2 diabetes mellitus with diabetic chronic kidney disease: Secondary | ICD-10-CM | POA: Diagnosis not present

## 2022-06-01 DIAGNOSIS — I130 Hypertensive heart and chronic kidney disease with heart failure and stage 1 through stage 4 chronic kidney disease, or unspecified chronic kidney disease: Secondary | ICD-10-CM | POA: Diagnosis not present

## 2022-06-01 DIAGNOSIS — E119 Type 2 diabetes mellitus without complications: Secondary | ICD-10-CM | POA: Diagnosis not present

## 2022-06-01 DIAGNOSIS — Z882 Allergy status to sulfonamides status: Secondary | ICD-10-CM | POA: Diagnosis not present

## 2022-06-01 DIAGNOSIS — R52 Pain, unspecified: Secondary | ICD-10-CM | POA: Diagnosis not present

## 2022-06-01 DIAGNOSIS — J90 Pleural effusion, not elsewhere classified: Secondary | ICD-10-CM | POA: Diagnosis not present

## 2022-06-01 DIAGNOSIS — I4891 Unspecified atrial fibrillation: Secondary | ICD-10-CM | POA: Diagnosis not present

## 2022-06-01 DIAGNOSIS — J441 Chronic obstructive pulmonary disease with (acute) exacerbation: Secondary | ICD-10-CM | POA: Diagnosis not present

## 2022-06-01 DIAGNOSIS — R918 Other nonspecific abnormal finding of lung field: Secondary | ICD-10-CM | POA: Diagnosis not present

## 2022-06-01 DIAGNOSIS — F329 Major depressive disorder, single episode, unspecified: Secondary | ICD-10-CM | POA: Diagnosis not present

## 2022-06-01 DIAGNOSIS — F32A Depression, unspecified: Secondary | ICD-10-CM | POA: Diagnosis not present

## 2022-06-01 DIAGNOSIS — Z86718 Personal history of other venous thrombosis and embolism: Secondary | ICD-10-CM | POA: Diagnosis not present

## 2022-06-01 DIAGNOSIS — R509 Fever, unspecified: Secondary | ICD-10-CM | POA: Diagnosis not present

## 2022-06-01 DIAGNOSIS — Z7982 Long term (current) use of aspirin: Secondary | ICD-10-CM | POA: Diagnosis not present

## 2022-06-01 DIAGNOSIS — Z452 Encounter for adjustment and management of vascular access device: Secondary | ICD-10-CM | POA: Diagnosis not present

## 2022-06-01 DIAGNOSIS — M109 Gout, unspecified: Secondary | ICD-10-CM | POA: Diagnosis not present

## 2022-06-01 DIAGNOSIS — Q2572 Congenital pulmonary arteriovenous malformation: Secondary | ICD-10-CM | POA: Diagnosis not present

## 2022-06-01 DIAGNOSIS — I340 Nonrheumatic mitral (valve) insufficiency: Secondary | ICD-10-CM | POA: Diagnosis not present

## 2022-06-01 DIAGNOSIS — Z79899 Other long term (current) drug therapy: Secondary | ICD-10-CM | POA: Diagnosis not present

## 2022-06-01 DIAGNOSIS — J9601 Acute respiratory failure with hypoxia: Secondary | ICD-10-CM | POA: Diagnosis not present

## 2022-06-01 DIAGNOSIS — J181 Lobar pneumonia, unspecified organism: Secondary | ICD-10-CM | POA: Diagnosis not present

## 2022-06-01 DIAGNOSIS — M1990 Unspecified osteoarthritis, unspecified site: Secondary | ICD-10-CM | POA: Diagnosis not present

## 2022-06-01 DIAGNOSIS — E039 Hypothyroidism, unspecified: Secondary | ICD-10-CM | POA: Diagnosis not present

## 2022-06-01 DIAGNOSIS — D649 Anemia, unspecified: Secondary | ICD-10-CM | POA: Diagnosis not present

## 2022-06-01 DIAGNOSIS — I509 Heart failure, unspecified: Secondary | ICD-10-CM | POA: Diagnosis not present

## 2022-06-01 DIAGNOSIS — I2510 Atherosclerotic heart disease of native coronary artery without angina pectoris: Secondary | ICD-10-CM | POA: Diagnosis not present

## 2022-06-01 DIAGNOSIS — J189 Pneumonia, unspecified organism: Secondary | ICD-10-CM | POA: Diagnosis not present

## 2022-06-01 DIAGNOSIS — I5022 Chronic systolic (congestive) heart failure: Secondary | ICD-10-CM | POA: Diagnosis not present

## 2022-06-01 DIAGNOSIS — B9561 Methicillin susceptible Staphylococcus aureus infection as the cause of diseases classified elsewhere: Secondary | ICD-10-CM | POA: Diagnosis not present

## 2022-06-01 DIAGNOSIS — I442 Atrioventricular block, complete: Secondary | ICD-10-CM | POA: Diagnosis not present

## 2022-06-01 DIAGNOSIS — R9431 Abnormal electrocardiogram [ECG] [EKG]: Secondary | ICD-10-CM | POA: Diagnosis not present

## 2022-06-01 DIAGNOSIS — Z7901 Long term (current) use of anticoagulants: Secondary | ICD-10-CM | POA: Diagnosis not present

## 2022-06-01 DIAGNOSIS — J449 Chronic obstructive pulmonary disease, unspecified: Secondary | ICD-10-CM | POA: Diagnosis not present

## 2022-06-01 DIAGNOSIS — I110 Hypertensive heart disease with heart failure: Secondary | ICD-10-CM | POA: Diagnosis not present

## 2022-06-01 DIAGNOSIS — Z743 Need for continuous supervision: Secondary | ICD-10-CM | POA: Diagnosis not present

## 2022-06-01 DIAGNOSIS — I959 Hypotension, unspecified: Secondary | ICD-10-CM | POA: Diagnosis not present

## 2022-06-02 DIAGNOSIS — R5381 Other malaise: Secondary | ICD-10-CM | POA: Diagnosis not present

## 2022-06-02 DIAGNOSIS — J449 Chronic obstructive pulmonary disease, unspecified: Secondary | ICD-10-CM | POA: Diagnosis not present

## 2022-06-02 DIAGNOSIS — E119 Type 2 diabetes mellitus without complications: Secondary | ICD-10-CM | POA: Diagnosis not present

## 2022-06-02 DIAGNOSIS — I700 Atherosclerosis of aorta: Secondary | ICD-10-CM | POA: Diagnosis not present

## 2022-06-03 DIAGNOSIS — N189 Chronic kidney disease, unspecified: Secondary | ICD-10-CM | POA: Diagnosis not present

## 2022-06-03 DIAGNOSIS — F419 Anxiety disorder, unspecified: Secondary | ICD-10-CM | POA: Diagnosis not present

## 2022-06-03 DIAGNOSIS — E039 Hypothyroidism, unspecified: Secondary | ICD-10-CM | POA: Diagnosis not present

## 2022-06-03 DIAGNOSIS — J449 Chronic obstructive pulmonary disease, unspecified: Secondary | ICD-10-CM | POA: Diagnosis not present

## 2022-06-03 DIAGNOSIS — R509 Fever, unspecified: Secondary | ICD-10-CM | POA: Diagnosis not present

## 2022-06-03 DIAGNOSIS — I509 Heart failure, unspecified: Secondary | ICD-10-CM | POA: Diagnosis not present

## 2022-06-03 DIAGNOSIS — Z950 Presence of cardiac pacemaker: Secondary | ICD-10-CM | POA: Diagnosis not present

## 2022-06-03 DIAGNOSIS — F329 Major depressive disorder, single episode, unspecified: Secondary | ICD-10-CM | POA: Diagnosis not present

## 2022-06-03 DIAGNOSIS — E1122 Type 2 diabetes mellitus with diabetic chronic kidney disease: Secondary | ICD-10-CM | POA: Diagnosis not present

## 2022-06-03 DIAGNOSIS — J441 Chronic obstructive pulmonary disease with (acute) exacerbation: Secondary | ICD-10-CM | POA: Diagnosis not present

## 2022-06-03 DIAGNOSIS — D649 Anemia, unspecified: Secondary | ICD-10-CM | POA: Diagnosis not present

## 2022-06-03 DIAGNOSIS — I2510 Atherosclerotic heart disease of native coronary artery without angina pectoris: Secondary | ICD-10-CM | POA: Diagnosis not present

## 2022-06-03 DIAGNOSIS — E119 Type 2 diabetes mellitus without complications: Secondary | ICD-10-CM | POA: Diagnosis not present

## 2022-06-03 DIAGNOSIS — I442 Atrioventricular block, complete: Secondary | ICD-10-CM | POA: Diagnosis not present

## 2022-06-03 DIAGNOSIS — M109 Gout, unspecified: Secondary | ICD-10-CM | POA: Diagnosis not present

## 2022-06-03 DIAGNOSIS — Z7982 Long term (current) use of aspirin: Secondary | ICD-10-CM | POA: Diagnosis not present

## 2022-06-03 DIAGNOSIS — E871 Hypo-osmolality and hyponatremia: Secondary | ICD-10-CM | POA: Diagnosis not present

## 2022-06-03 DIAGNOSIS — R918 Other nonspecific abnormal finding of lung field: Secondary | ICD-10-CM | POA: Diagnosis not present

## 2022-06-03 DIAGNOSIS — R52 Pain, unspecified: Secondary | ICD-10-CM | POA: Diagnosis not present

## 2022-06-03 DIAGNOSIS — R9431 Abnormal electrocardiogram [ECG] [EKG]: Secondary | ICD-10-CM | POA: Diagnosis not present

## 2022-06-03 DIAGNOSIS — Z882 Allergy status to sulfonamides status: Secondary | ICD-10-CM | POA: Diagnosis not present

## 2022-06-03 DIAGNOSIS — Z7984 Long term (current) use of oral hypoglycemic drugs: Secondary | ICD-10-CM | POA: Diagnosis not present

## 2022-06-03 DIAGNOSIS — I959 Hypotension, unspecified: Secondary | ICD-10-CM | POA: Diagnosis not present

## 2022-06-03 DIAGNOSIS — I5022 Chronic systolic (congestive) heart failure: Secondary | ICD-10-CM | POA: Diagnosis not present

## 2022-06-03 DIAGNOSIS — M1990 Unspecified osteoarthritis, unspecified site: Secondary | ICD-10-CM | POA: Diagnosis not present

## 2022-06-03 DIAGNOSIS — R7881 Bacteremia: Secondary | ICD-10-CM | POA: Diagnosis present

## 2022-06-03 DIAGNOSIS — M79671 Pain in right foot: Secondary | ICD-10-CM | POA: Diagnosis not present

## 2022-06-03 DIAGNOSIS — F32A Depression, unspecified: Secondary | ICD-10-CM | POA: Diagnosis not present

## 2022-06-03 DIAGNOSIS — I4891 Unspecified atrial fibrillation: Secondary | ICD-10-CM | POA: Diagnosis not present

## 2022-06-03 DIAGNOSIS — L03115 Cellulitis of right lower limb: Secondary | ICD-10-CM | POA: Diagnosis not present

## 2022-06-03 DIAGNOSIS — Z79899 Other long term (current) drug therapy: Secondary | ICD-10-CM | POA: Diagnosis not present

## 2022-06-03 DIAGNOSIS — Z888 Allergy status to other drugs, medicaments and biological substances status: Secondary | ICD-10-CM | POA: Diagnosis not present

## 2022-06-03 DIAGNOSIS — Q2572 Congenital pulmonary arteriovenous malformation: Secondary | ICD-10-CM | POA: Diagnosis not present

## 2022-06-03 DIAGNOSIS — I130 Hypertensive heart and chronic kidney disease with heart failure and stage 1 through stage 4 chronic kidney disease, or unspecified chronic kidney disease: Secondary | ICD-10-CM | POA: Diagnosis not present

## 2022-06-03 DIAGNOSIS — Z7901 Long term (current) use of anticoagulants: Secondary | ICD-10-CM | POA: Diagnosis not present

## 2022-06-08 ENCOUNTER — Other Ambulatory Visit: Payer: Self-pay

## 2022-06-08 ENCOUNTER — Encounter: Payer: Self-pay | Admitting: *Deleted

## 2022-06-08 ENCOUNTER — Telehealth: Payer: Self-pay | Admitting: *Deleted

## 2022-06-08 ENCOUNTER — Other Ambulatory Visit: Payer: Self-pay | Admitting: Family Medicine

## 2022-06-08 DIAGNOSIS — K5790 Diverticulosis of intestine, part unspecified, without perforation or abscess without bleeding: Secondary | ICD-10-CM | POA: Diagnosis not present

## 2022-06-08 DIAGNOSIS — N2889 Other specified disorders of kidney and ureter: Secondary | ICD-10-CM | POA: Diagnosis not present

## 2022-06-08 DIAGNOSIS — I4891 Unspecified atrial fibrillation: Secondary | ICD-10-CM | POA: Diagnosis not present

## 2022-06-08 DIAGNOSIS — I2510 Atherosclerotic heart disease of native coronary artery without angina pectoris: Secondary | ICD-10-CM | POA: Diagnosis not present

## 2022-06-08 DIAGNOSIS — J439 Emphysema, unspecified: Secondary | ICD-10-CM | POA: Diagnosis not present

## 2022-06-08 DIAGNOSIS — F32A Depression, unspecified: Secondary | ICD-10-CM | POA: Diagnosis not present

## 2022-06-08 DIAGNOSIS — Z7951 Long term (current) use of inhaled steroids: Secondary | ICD-10-CM | POA: Diagnosis not present

## 2022-06-08 DIAGNOSIS — R7881 Bacteremia: Secondary | ICD-10-CM | POA: Diagnosis not present

## 2022-06-08 DIAGNOSIS — Z7982 Long term (current) use of aspirin: Secondary | ICD-10-CM | POA: Diagnosis not present

## 2022-06-08 DIAGNOSIS — F419 Anxiety disorder, unspecified: Secondary | ICD-10-CM | POA: Diagnosis not present

## 2022-06-08 DIAGNOSIS — M109 Gout, unspecified: Secondary | ICD-10-CM | POA: Diagnosis not present

## 2022-06-08 DIAGNOSIS — E871 Hypo-osmolality and hyponatremia: Secondary | ICD-10-CM | POA: Diagnosis not present

## 2022-06-08 DIAGNOSIS — Z7901 Long term (current) use of anticoagulants: Secondary | ICD-10-CM | POA: Diagnosis not present

## 2022-06-08 DIAGNOSIS — M1990 Unspecified osteoarthritis, unspecified site: Secondary | ICD-10-CM | POA: Diagnosis not present

## 2022-06-08 DIAGNOSIS — Z452 Encounter for adjustment and management of vascular access device: Secondary | ICD-10-CM | POA: Diagnosis not present

## 2022-06-08 DIAGNOSIS — Z7984 Long term (current) use of oral hypoglycemic drugs: Secondary | ICD-10-CM | POA: Diagnosis not present

## 2022-06-08 DIAGNOSIS — Q2572 Congenital pulmonary arteriovenous malformation: Secondary | ICD-10-CM | POA: Diagnosis not present

## 2022-06-08 DIAGNOSIS — Z9181 History of falling: Secondary | ICD-10-CM | POA: Diagnosis not present

## 2022-06-08 DIAGNOSIS — J4489 Other specified chronic obstructive pulmonary disease: Secondary | ICD-10-CM | POA: Diagnosis not present

## 2022-06-08 DIAGNOSIS — Z950 Presence of cardiac pacemaker: Secondary | ICD-10-CM | POA: Diagnosis not present

## 2022-06-08 DIAGNOSIS — I110 Hypertensive heart disease with heart failure: Secondary | ICD-10-CM | POA: Diagnosis not present

## 2022-06-08 DIAGNOSIS — R918 Other nonspecific abnormal finding of lung field: Secondary | ICD-10-CM | POA: Diagnosis not present

## 2022-06-08 DIAGNOSIS — Z792 Long term (current) use of antibiotics: Secondary | ICD-10-CM | POA: Diagnosis not present

## 2022-06-08 DIAGNOSIS — E039 Hypothyroidism, unspecified: Secondary | ICD-10-CM | POA: Diagnosis not present

## 2022-06-08 DIAGNOSIS — L03115 Cellulitis of right lower limb: Secondary | ICD-10-CM | POA: Diagnosis not present

## 2022-06-08 MED ORDER — DIGOXIN 125 MCG PO TABS: 0.0625 mg | ORAL_TABLET | Freq: Every day | ORAL | 1 refills | Status: DC

## 2022-06-08 NOTE — Patient Outreach (Signed)
  Care Coordination TOC Note Transition Care Management Follow-up Telephone Call Date of discharge and from where: Wednesday, 06/07/22 [facility], per caregiver report: "for infection in her blood" How have you been since you were released from the hospital? Per husband/ caregiver Darrell, on [facility] DPR: "She seems to be doing much better.  The home health nurse is coming this morning, I am expecting her at any time now- I have been trained in giving her the IV's she needs, and the home health nurse has given me her contact information.  My daughter is coming in tomorrow to stay with [name] awhile and help [name] also.  I will be making all of these appointments soon, but right now, I just need to get ready for this nurse that is coming in a few minutes" Any questions or concerns? No  Items Reviewed: Did the pt receive and understand the discharge instructions provided? Yes  Medications obtained and verified?  Husband/ caregiver declined- states he understands all of patient's care needs/ medications Other? No  Any new allergies since your discharge? No  Dietary orders reviewed? Yes Do you have support at home? Yes  patient's husband assisting as indicated/ needed  Home Care and Equipment/Supplies: Were home health services ordered? yes If so, what is the name of the agency? "I can't exactly remember-- I don't have that information right in front of me, its something like [facility].  I have their business card and their phone number, the nurse is on her way over this morning, I am expecting her soon"  Has the agency set up a time to come to the patient's home? yes Were any new equipment or medical supplies ordered?  Yes: IV equipment/ supplies What is the name of the medical supply agency? "I don't know, they set it all up at [facility]" Were you able to get the supplies/equipment? yes Do you have any questions related to the use of the equipment or supplies? No  Functional  Questionnaire: (I = Independent and D = Dependent) ADLs: I  patient's husband assisting as indicated/ needed  Bathing/Dressing- I  patient's husband assisting as indicated/ needed  Meal Prep- I  patient's husband assisting as indicated/ needed  Eating- I  Maintaining continence- I  Transferring/Ambulation- I  patient's husband assisting as indicated/ needed  Managing Meds- I patient's husband assisting as indicated/ needed  Follow up appointments reviewed:  PCP Hospital f/u appt confirmed? No  Scheduled to see - on - @ [facility] f/u appt confirmed? No  Scheduled to see - on - @ - Are transportation arrangements needed? No  If their condition worsens, is the pt aware to call PCP or go to the Emergency Dept.? Yes Was the patient provided with contact information for the PCP's office or ED? Yes Was to pt encouraged to call back with questions or concerns? Yes  SDOH assessments and interventions completed:   Yes  Care Coordination Interventions Activated:  Yes   Care Coordination Interventions:  PCP follow up appointment requested Care Coordination outreach to CCM RN CM to inform of recent hospital discharge    Encounter Outcome:  Pt. Visit Completed    Oneta Rack, RN, BSN, CCRN Alumnus RN CM Care Coordination/ Transition of [place] Management [phone]: direct office

## 2022-06-12 DIAGNOSIS — R7989 Other specified abnormal findings of blood chemistry: Secondary | ICD-10-CM | POA: Diagnosis not present

## 2022-06-12 DIAGNOSIS — D649 Anemia, unspecified: Secondary | ICD-10-CM | POA: Diagnosis not present

## 2022-06-12 DIAGNOSIS — J439 Emphysema, unspecified: Secondary | ICD-10-CM | POA: Diagnosis not present

## 2022-06-12 DIAGNOSIS — F419 Anxiety disorder, unspecified: Secondary | ICD-10-CM | POA: Diagnosis not present

## 2022-06-12 DIAGNOSIS — R7881 Bacteremia: Secondary | ICD-10-CM | POA: Diagnosis not present

## 2022-06-12 DIAGNOSIS — I4891 Unspecified atrial fibrillation: Secondary | ICD-10-CM | POA: Diagnosis not present

## 2022-06-12 DIAGNOSIS — I2510 Atherosclerotic heart disease of native coronary artery without angina pectoris: Secondary | ICD-10-CM | POA: Diagnosis not present

## 2022-06-12 DIAGNOSIS — L03115 Cellulitis of right lower limb: Secondary | ICD-10-CM | POA: Diagnosis not present

## 2022-06-12 LAB — BASIC METABOLIC PANEL
BUN: 35 — AB (ref 4–21)
CO2: 24 — AB (ref 13–22)
Chloride: 103 (ref 99–108)
Creatinine: 1 (ref 0.5–1.1)
Glucose: 105
Potassium: 4.1 mEq/L (ref 3.5–5.1)
Sodium: 137 (ref 137–147)

## 2022-06-12 LAB — CBC AND DIFFERENTIAL
HCT: 30 — AB (ref 36–46)
Hemoglobin: 9.9 — AB (ref 12.0–16.0)
Platelets: 324 10*3/uL (ref 150–400)
WBC: 5.7

## 2022-06-12 LAB — COMPREHENSIVE METABOLIC PANEL: Calcium: 9.4 (ref 8.7–10.7)

## 2022-06-12 LAB — CBC: RBC: 3.44 — AB (ref 3.87–5.11)

## 2022-06-15 ENCOUNTER — Ambulatory Visit (INDEPENDENT_AMBULATORY_CARE_PROVIDER_SITE_OTHER): Payer: Medicare Other | Admitting: Family Medicine

## 2022-06-15 VITALS — BP 118/60 | HR 84 | Temp 97.4°F | Resp 18 | Ht 62.0 in | Wt 95.4 lb

## 2022-06-15 DIAGNOSIS — I2510 Atherosclerotic heart disease of native coronary artery without angina pectoris: Secondary | ICD-10-CM | POA: Diagnosis not present

## 2022-06-15 DIAGNOSIS — F330 Major depressive disorder, recurrent, mild: Secondary | ICD-10-CM | POA: Diagnosis not present

## 2022-06-15 DIAGNOSIS — L03116 Cellulitis of left lower limb: Secondary | ICD-10-CM

## 2022-06-15 DIAGNOSIS — E782 Mixed hyperlipidemia: Secondary | ICD-10-CM

## 2022-06-15 DIAGNOSIS — Z950 Presence of cardiac pacemaker: Secondary | ICD-10-CM | POA: Diagnosis not present

## 2022-06-15 DIAGNOSIS — E1121 Type 2 diabetes mellitus with diabetic nephropathy: Secondary | ICD-10-CM | POA: Diagnosis not present

## 2022-06-15 DIAGNOSIS — F419 Anxiety disorder, unspecified: Secondary | ICD-10-CM | POA: Diagnosis not present

## 2022-06-15 DIAGNOSIS — I130 Hypertensive heart and chronic kidney disease with heart failure and stage 1 through stage 4 chronic kidney disease, or unspecified chronic kidney disease: Secondary | ICD-10-CM

## 2022-06-15 DIAGNOSIS — N1832 Chronic kidney disease, stage 3b: Secondary | ICD-10-CM

## 2022-06-15 DIAGNOSIS — G720 Drug-induced myopathy: Secondary | ICD-10-CM | POA: Diagnosis not present

## 2022-06-15 DIAGNOSIS — I509 Heart failure, unspecified: Secondary | ICD-10-CM | POA: Diagnosis not present

## 2022-06-15 DIAGNOSIS — I420 Dilated cardiomyopathy: Secondary | ICD-10-CM | POA: Diagnosis not present

## 2022-06-15 DIAGNOSIS — I4891 Unspecified atrial fibrillation: Secondary | ICD-10-CM | POA: Diagnosis not present

## 2022-06-15 DIAGNOSIS — R7881 Bacteremia: Secondary | ICD-10-CM | POA: Diagnosis not present

## 2022-06-15 DIAGNOSIS — I5020 Unspecified systolic (congestive) heart failure: Secondary | ICD-10-CM

## 2022-06-15 DIAGNOSIS — L03115 Cellulitis of right lower limb: Secondary | ICD-10-CM | POA: Diagnosis not present

## 2022-06-15 DIAGNOSIS — J439 Emphysema, unspecified: Secondary | ICD-10-CM | POA: Diagnosis not present

## 2022-06-15 LAB — LAB REPORT - SCANNED: Digoxin: 0.4

## 2022-06-15 NOTE — Progress Notes (Signed)
Subjective:  Patient ID: Bridget [name], female    DOB: 1940-09-17  Age: 81 y.o. MRN: [id number]  Chief Complaint  Patient presents with   hospital followup    HPI Patient is 81 year old white female with past medical history significant for diabetes, COPD, congestive heart failure who presented to the emergency department with increasing shortness of breath on May 01, 2022 and was admitted until May 06, 2022.Marland Kitchen  She was found to be in acute hypoxic respiratory failure secondary to CHF exacerbation and COPD exacerbation.  Patient was found to have bilateral pleural effusions the right larger than the left.  Also found to have a right upper lobe mass which on subsequent imaging decreased in size.  Initially this was thought to be a malignancy but is now more thought to be infectious or inflammatory cause which is resolving.  She also developed a type II non-STEMI.  CTA of the chest was obtained and was negative for pulmonary embolism, although it did show some coronary artery disease.  Patient had a left heart cath in 2021 with normal coronaries.  In addition the patient had frequent PVCs and short burst of V. tach.  Her pacemaker was interrogated during her admission.  Viral testing to include COVID, flu and RSV were all negative.  Patient was discharged on doxycycline following IV antibiotics.  Patient also received IV diuresis.  Discharged on torsemide.  Her losartan was held and she was started on Entresto. Patient was in the hospital due to breathing issues.  Patient states she had fluid around heart and lungs.  Patient states she is feeling better.  Patient was admitted to [facility] from May 26, 2022 to May 29, 2022.  This was for an exacerbation of her congestive heart failure and a lung mass.  She was transferred from [facility] due to worsening shortness of breath.  She required high flow nasal cannula oxygen up to 7  to 10 L.  Patient grew out Staph aureus from her blood cultures x2.  As stated above likely her CT scan was repeated and the area of the mass seems to be decreasing in size which favors it for pneumonia.  She did have a thoracentesis while she was there.  She was placed on digoxin for her congestive heart failure.  Her Chloratet centesis yielded 800 mL of clear yellow fluid.  Echocardiogram showed severe MR, diastolic dysfunction and dilated left atrium.  As stated previously EF is 20%.  Is following this admission that she was put on IV antibiotics and was post receive these at the nursing home.  As stated below she ended up returning to [facility].   The family was very disappointed in the care at [facility].  Zsazsa developed severe erythema and edema of her foot.  They were unable to get a doctor to come see her over the weekend and finally on June 03, 2022 her husband called the ambulance to pick her up at the nursing home.  They transported her to [facility] where she was admitted from June 03, 2022 to June 07, 2022 for cellulitis.  Patient received IV Rocephin and vancomycin and then was changed to IV doxycycline.  Patient was discharged home on IV Rocephin 2 g daily through July 10, 2022.  She completed doxycycline orally also. Her cellulitis responded within 24 hours with significant improvement.  The patient is doing well.  She has physical therapy and skilled nursing coming out  to help her.  She has numerous visits with specialists coming up including 1 with infectious disease, cardiology, pulmonology, and endocrinology..    Current Outpatient Medications on File Prior to Visit  Medication Sig Dispense Refill   albuterol (VENTOLIN HFA) 108 (90 Base) MCG/ACT inhaler INHALE 1 TO 2 PUFFS BY MOUTH EVERY 4 HOURS AS NEEDED FOR WHEEZING 8 g 2   aspirin 81 MG chewable tablet Chew 81 mg by mouth in the morning.      Blood Glucose Monitoring Suppl (ONETOUCH VERIO  FLEX SYSTEM) w/Device KIT Check sugar once daily 1 kit 0   Budeson-Glycopyrrol-Formoterol (BREZTRI AEROSPHERE) 160-9-4.8 MCG/ACT AERO Inhale 2 puffs into the lungs 2 (two) times daily. 32.1 g 3   calcium-vitamin D (OSCAL WITH D) 500-200 MG-UNIT TABS tablet Take 1 tablet by mouth daily.     carvedilol (COREG) 12.5 MG tablet Take 12.5 mg by mouth 2 (two) times daily with a meal.     Cholecalciferol (VITAMIN D-3) 1000 UNITS CAPS Take 1,000 Units by mouth daily.     colchicine 0.6 MG tablet Take 1 tablet (0.6 mg total) by mouth 2 (two) times daily. 14 tablet 0   digoxin (LANOXIN) 0.125 MG tablet Take 0.5 tablets (0.0625 mg total) by mouth daily. 30 tablet 1   empagliflozin (JARDIANCE) 10 MG TABS tablet Take 1 tablet (10 mg total) by mouth daily with breakfast. 30 tablet 3   lovastatin (MEVACOR) 40 MG tablet Take 1 tablet (40 mg total) by mouth at bedtime. 90 tablet 1   Magnesium 250 MG TABS Take 1 tablet (250 mg total) by mouth in the morning and at bedtime. 60 tablet 0   metFORMIN (GLUCOPHAGE) 500 MG tablet TAKE 1 TABLET BY MOUTH TWICE DAILY WITH MORNING MEAL AND WITH EVENING MEAL 180 tablet 0   Multiple Vitamins-Minerals (CENTRUM SILVER ULTRA WOMENS PO) Take 1 tablet by mouth daily.     nitroGLYCERIN (NITROSTAT) 0.4 MG SL tablet DISSOLVE ONE TABLET UNDER THE TONGUE EVERY 5 MINUTES AS NEEDED FOR CHEST PAIN.  DO NOT EXCEED A TOTAL OF 3 DOSES IN 15 MINUTES 25 tablet 0   omeprazole (PRILOSEC) 20 MG capsule Take 1 capsule (20 mg total) by mouth daily. 90 capsule 3   ondansetron (ZOFRAN) 4 MG tablet Take 1 tablet (4 mg total) by mouth every 8 (eight) hours as needed for nausea or vomiting. 40 tablet 0   OneTouch Delica Lancets 95A MISC USE 1  TO CHECK GLUCOSE ONCE DAILY *APPOINTMENT  REQUIRED  FOR  FUTURE  REFILLS 100 each 3   ONETOUCH VERIO test strip Use to check blood sugar once daily  DX CODE E11.9 100 each 1   Polyethyl Glycol-Propyl Glycol (SYSTANE ULTRA) 0.4-0.3 % SOLN Place 1 drop into both eyes as  needed (dry eyes).     Rivaroxaban (XARELTO) 15 MG TABS tablet Take 15 mg by mouth every other day.     torsemide (DEMADEX) 20 MG tablet Take 10 mg by mouth daily.     venlafaxine XR (EFFEXOR-XR) 75 MG 24 hr capsule TAKE 1 CAPSULE BY MOUTH ONCE DAILY WITH BREAKFAST 90 capsule 0   ENTRESTO 24-26 MG Take 1 tablet by mouth 2 (two) times daily.     No current facility-administered medications on file prior to visit.   Past Medical History:  Diagnosis Date   Anemia    Anxiety and depression    Arthritis    HANDS,ARMS,FEET   Asthma    Atrioventricular block    CAD (coronary artery  disease)    CHF (congestive heart failure) (HCC)    Chronic renal insufficiency    Clotting disorder (HCC)    Complication of anesthesia    Difficulty waking up once.   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 Otherwise normal colonoscopy   ESOPHAGOGASTRODUODENOSCOPY  11/16/2005   [place]. Patchy areas of mucosal  erythema in the antrum compatible with gastritis. Polyps in the stomach. Nodule in the antrum. Medium hiatal hernia   ESOPHAGOGASTRODUODENOSCOPY (EGD) WITH PROPOFOL N/A 04/13/2022   Procedure: ESOPHAGOGASTRODUODENOSCOPY (EGD) WITH PROPOFOL;  Surgeon: Jackquline Denmark, MD;  Location: [facility];  Service: Gastroenterology;  Laterality: N/A;   MALONEY DILATION  04/13/2022   Procedure: Venia Minks DILATION;  Surgeon: Jackquline Denmark, MD;  Location: [facility];  Service: Gastroenterology;;   pacemaker  09/27/2016   PACEMAKER REVISION  09/27/2020   PACEMAKER UPGRADE DUAL CHAMBER TO BIV    Family History  Problem Relation Age of Onset   High blood pressure Mother    Diabetes Mother        no medication needed   Kidney disease Mother    Pneumonia Mother    Colon polyps Father    High blood pressure Father    Colon cancer Father    Cancer Maternal Grandmother        Leukemia   Cancer Other        Ovarian   Rectal cancer Neg Hx    Stomach cancer Neg Hx    Esophageal cancer Neg Hx    Liver cancer Neg Hx    Pancreatic cancer Neg Hx    Crohn's disease Neg Hx    Social History   Socioeconomic History   Marital status: Married    Spouse name: Darryl   Number of children: 3   Years of education: Not on file   Highest education level: 9th grade  Occupational History   Occupation: Retired  Tobacco Use   Smoking status: Never    Passive exposure: Past   Smokeless tobacco: Never  Vaping Use   Vaping Use: Never used  Substance and Sexual Activity   Alcohol use: Never   Drug use: Never   Sexual activity: Not on file  Other Topics Concern   Not on file  Social History Narrative   Lives at home with her husband  Right handed   Caffeine: mostly tea, 0-2 cups a day   Social Determinants of Health   Financial Resource Strain: Low Risk  (04/20/2022)   Overall Financial Resource Strain (CARDIA)    Difficulty of Paying Living Expenses: Not hard at all  Recent Concern: Financial Resource  Strain - High Risk (04/19/2022)   Overall Financial Resource Strain (CARDIA)    Difficulty of Paying Living Expenses: Very hard  Food Insecurity: No Food Insecurity (06/08/2022)   Hunger Vital Sign    Worried About Running Out of Food in the Last Year: Never true    Ran Out of Food in the Last Year: Never true  Transportation Needs: No Transportation Needs (06/08/2022)   PRAPARE - Hydrologist (Medical): No    Lack of Transportation (Non-Medical): No  Physical Activity: Inactive (04/20/2022)   Exercise Vital Sign    Days of Exercise per Week: 0 days    Minutes of Exercise per Session: 0 min  Stress: No Stress Concern Present (04/20/2022)   [place]    Feeling of Stress : Only a little  Social Connections: Moderately Integrated (04/20/2022)   Social Connection and Isolation Panel [NHANES]    Frequency of Communication with Friends and Family: More than three times a week    Frequency of Social Gatherings with Friends and Family: More than three times a week    Attends Religious Services: 1 to 4 times per year    Active Member of [employer] or Organizations: No    Attends Archivist Meetings: Never    Marital Status: Married    Review of Systems  Constitutional:  Negative for chills and fatigue.  HENT:  Negative for congestion, ear pain, nosebleeds, sinus pain and sore throat.   Respiratory:  Negative for cough and shortness of breath.   Cardiovascular:  Negative for chest pain and leg swelling.  Gastrointestinal:  Positive for diarrhea. Negative for abdominal pain, constipation, nausea and vomiting.  Genitourinary:  Negative for dysuria and frequency.  Musculoskeletal:  Negative for arthralgias, back pain and myalgias.  Neurological:  Negative for dizziness and headaches.     Objective:  BP 118/60   Pulse 84   Temp (!) 97.4 F (36.3 C)   Resp 18   Ht _0  (1.575 m)   Wt 95 lb  6.4 oz (43.3 kg)   SpO2 94%   BMI 17.45 kg/m      06/15/2022    3:34 PM 04/25/2022    8:11 AM 04/20/2022    7:58 AM  BP/Weight  Systolic BP 557 322 025  Diastolic BP 60 78 70  Wt. (Lbs) 95.4 98 99  BMI 17.45 kg/m2 19.79 kg/m2 19.46 kg/m2    Physical Exam Vitals reviewed.  Constitutional:      Appearance: Normal appearance. She is normal weight.  Neck:     Vascular: No carotid bruit.  Cardiovascular:     Rate and Rhythm: Normal rate and regular rhythm.     Heart sounds: Normal heart sounds.  Pulmonary:     Effort: Pulmonary effort is normal. No respiratory distress.     Breath sounds: Normal breath sounds.  Abdominal:     General: Abdomen is flat. Bowel sounds are normal.     Palpations: Abdomen is soft.     Tenderness: There is no abdominal tenderness.  Neurological:     Mental Status: She is alert and oriented to person, place, and  time.  Psychiatric:        Mood and Affect: Mood normal.        Behavior: Behavior normal.     Diabetic Foot Exam - Simple   No data filed      Lab Results  Component Value Date   WBC 6.6 03/30/2022   HGB 10.4 (L) 03/30/2022   HCT 32.0 (L) 03/30/2022   PLT 156 03/30/2022   GLUCOSE 134 (H) 04/20/2022   CHOL 216 (H) 03/30/2022   TRIG 153 (H) 03/30/2022   HDL 76 03/30/2022   LDLDIRECT 94.2 12/05/2013   LDLCALC 114 (H) 03/30/2022   ALT 15 04/20/2022   AST 19 04/20/2022   NA 140 04/20/2022   K 4.3 04/20/2022   CL 103 04/20/2022   CREATININE 1.20 (H) 04/20/2022   BUN 24 04/20/2022   CO2 20 04/20/2022   TSH 1.220 04/20/2022   INR 1.3 (H) 11/12/2020   HGBA1C 7.4 (H) 03/30/2022   MICROALBUR 4.0 (H) 03/25/2021      Assessment & Plan:   Problem List Items Addressed This Visit       Cardiovascular and Mediastinum   Heart failure with reduced ejection fraction, NYHA class III ([place])    Continue entresto and torsemide.       Relevant Medications   ENTRESTO 24-26 MG   torsemide (DEMADEX) 20 MG tablet   Nonischemic dilated  cardiomyopathy (HCC)   Relevant Medications   ENTRESTO 24-26 MG   torsemide (DEMADEX) 20 MG tablet   Heart and renal disease, hypertensive, stage 1-4 or unspecified chronic kidney disease, with heart failure ([place])    The current medical regimen is effective;  continue present plan and medications. Management per specialist.        Relevant Medications   ENTRESTO 24-26 MG   torsemide (DEMADEX) 20 MG tablet     Endocrine   Diabetic glomerulopathy (HCC)    Control: good Recommend check sugars fasting daily. Recommend check feet daily. Recommend annual eye exams. Medicines: continue jardiance Continue to work on eating a healthy diet and exercise.            Musculoskeletal and Integument   Cellulitis of left foot    Complete IV antibiotics.      RESOLVED: Drug-induced myopathy     Genitourinary   Stage 3b chronic kidney disease (HCC)    Stable.        Other   Mixed hyperlipidemia - Primary    Well controlled.  No changes to medicines. Continue lovastatin 40 mg before bed.  Continue to work on eating a healthy diet and exercise.  Labs drawn today.        Relevant Medications   ENTRESTO 24-26 MG   torsemide (DEMADEX) 20 MG tablet   Presence of cardiac pacemaker   Major depressive disorder, recurrent episode, mild (HCC)    The current medical regimen is effective;  continue present plan and medications.      .  No orders of the defined types were placed in this encounter.   No orders of the defined types were placed in this encounter.    Follow-up: Return in about 3 months (around 09/15/2022).  An After Visit Summary was printed and given to the patient.  [name], MD [facility] [phone]

## 2022-06-18 ENCOUNTER — Other Ambulatory Visit: Payer: Self-pay | Admitting: Family Medicine

## 2022-06-18 DIAGNOSIS — I739 Peripheral vascular disease, unspecified: Secondary | ICD-10-CM

## 2022-06-18 DIAGNOSIS — L03116 Cellulitis of left lower limb: Secondary | ICD-10-CM | POA: Insufficient documentation

## 2022-06-18 NOTE — Assessment & Plan Note (Signed)
Complete IV antibiotics.

## 2022-06-18 NOTE — Assessment & Plan Note (Signed)
Stable

## 2022-06-18 NOTE — Assessment & Plan Note (Signed)
The current medical regimen is effective;  continue present plan and medications. Management per specialist.   

## 2022-06-18 NOTE — Assessment & Plan Note (Signed)
Well controlled.  No changes to medicines. Continue lovastatin 40 mg before bed.  Continue to work on eating a healthy diet and exercise.  Labs drawn today.   

## 2022-06-18 NOTE — Assessment & Plan Note (Signed)
Continue entresto and torsemide.

## 2022-06-18 NOTE — Assessment & Plan Note (Signed)
The current medical regimen is effective;  continue present plan and medications.  

## 2022-06-18 NOTE — Assessment & Plan Note (Signed)
Control: good Recommend check sugars fasting daily. Recommend check feet daily. Recommend annual eye exams. Medicines: continue jardiance Continue to work on eating a healthy diet and exercise.

## 2022-06-19 ENCOUNTER — Encounter: Payer: Self-pay | Admitting: Family Medicine

## 2022-06-19 DIAGNOSIS — J439 Emphysema, unspecified: Secondary | ICD-10-CM | POA: Diagnosis not present

## 2022-06-19 DIAGNOSIS — R7881 Bacteremia: Secondary | ICD-10-CM | POA: Diagnosis not present

## 2022-06-19 DIAGNOSIS — I4891 Unspecified atrial fibrillation: Secondary | ICD-10-CM | POA: Diagnosis not present

## 2022-06-19 DIAGNOSIS — E119 Type 2 diabetes mellitus without complications: Secondary | ICD-10-CM | POA: Diagnosis not present

## 2022-06-19 DIAGNOSIS — F419 Anxiety disorder, unspecified: Secondary | ICD-10-CM | POA: Diagnosis not present

## 2022-06-19 DIAGNOSIS — I2510 Atherosclerotic heart disease of native coronary artery without angina pectoris: Secondary | ICD-10-CM | POA: Diagnosis not present

## 2022-06-19 DIAGNOSIS — L03115 Cellulitis of right lower limb: Secondary | ICD-10-CM | POA: Diagnosis not present

## 2022-06-19 DIAGNOSIS — R799 Abnormal finding of blood chemistry, unspecified: Secondary | ICD-10-CM | POA: Diagnosis not present

## 2022-06-20 DIAGNOSIS — R7881 Bacteremia: Secondary | ICD-10-CM | POA: Diagnosis not present

## 2022-06-20 DIAGNOSIS — J90 Pleural effusion, not elsewhere classified: Secondary | ICD-10-CM | POA: Diagnosis not present

## 2022-06-20 DIAGNOSIS — Z792 Long term (current) use of antibiotics: Secondary | ICD-10-CM | POA: Diagnosis not present

## 2022-06-20 DIAGNOSIS — B9561 Methicillin susceptible Staphylococcus aureus infection as the cause of diseases classified elsewhere: Secondary | ICD-10-CM | POA: Diagnosis not present

## 2022-06-20 DIAGNOSIS — Z950 Presence of cardiac pacemaker: Secondary | ICD-10-CM | POA: Diagnosis not present

## 2022-06-21 DIAGNOSIS — I4891 Unspecified atrial fibrillation: Secondary | ICD-10-CM | POA: Diagnosis not present

## 2022-06-21 DIAGNOSIS — F419 Anxiety disorder, unspecified: Secondary | ICD-10-CM | POA: Diagnosis not present

## 2022-06-21 DIAGNOSIS — I2510 Atherosclerotic heart disease of native coronary artery without angina pectoris: Secondary | ICD-10-CM | POA: Diagnosis not present

## 2022-06-21 DIAGNOSIS — R7881 Bacteremia: Secondary | ICD-10-CM | POA: Diagnosis not present

## 2022-06-21 DIAGNOSIS — J439 Emphysema, unspecified: Secondary | ICD-10-CM | POA: Diagnosis not present

## 2022-06-21 DIAGNOSIS — L03115 Cellulitis of right lower limb: Secondary | ICD-10-CM | POA: Diagnosis not present

## 2022-06-22 DIAGNOSIS — I5023 Acute on chronic systolic (congestive) heart failure: Secondary | ICD-10-CM | POA: Diagnosis not present

## 2022-06-22 DIAGNOSIS — I4719 Other supraventricular tachycardia: Secondary | ICD-10-CM | POA: Diagnosis not present

## 2022-06-22 DIAGNOSIS — I442 Atrioventricular block, complete: Secondary | ICD-10-CM | POA: Diagnosis not present

## 2022-06-22 DIAGNOSIS — I4891 Unspecified atrial fibrillation: Secondary | ICD-10-CM | POA: Diagnosis not present

## 2022-06-22 DIAGNOSIS — I428 Other cardiomyopathies: Secondary | ICD-10-CM | POA: Diagnosis not present

## 2022-06-22 DIAGNOSIS — Z45018 Encounter for adjustment and management of other part of cardiac pacemaker: Secondary | ICD-10-CM | POA: Diagnosis not present

## 2022-06-22 DIAGNOSIS — J439 Emphysema, unspecified: Secondary | ICD-10-CM | POA: Diagnosis not present

## 2022-06-22 DIAGNOSIS — I110 Hypertensive heart disease with heart failure: Secondary | ICD-10-CM | POA: Diagnosis not present

## 2022-06-22 DIAGNOSIS — I2510 Atherosclerotic heart disease of native coronary artery without angina pectoris: Secondary | ICD-10-CM | POA: Diagnosis not present

## 2022-06-22 DIAGNOSIS — I5020 Unspecified systolic (congestive) heart failure: Secondary | ICD-10-CM | POA: Diagnosis not present

## 2022-06-22 DIAGNOSIS — F419 Anxiety disorder, unspecified: Secondary | ICD-10-CM | POA: Diagnosis not present

## 2022-06-22 DIAGNOSIS — Z7901 Long term (current) use of anticoagulants: Secondary | ICD-10-CM | POA: Diagnosis not present

## 2022-06-22 DIAGNOSIS — R7881 Bacteremia: Secondary | ICD-10-CM | POA: Diagnosis not present

## 2022-06-22 DIAGNOSIS — L03115 Cellulitis of right lower limb: Secondary | ICD-10-CM | POA: Diagnosis not present

## 2022-06-23 ENCOUNTER — Telehealth: Payer: Self-pay

## 2022-06-23 NOTE — Telephone Encounter (Signed)
   Reason for call: ED-Follow up call   Patient visited [facility] on 05/26/2022    Have you been able to follow up with your primary care physician? - Yes  The patient was or was not able to obtain any needed medicine or equipment. - Was, Yes  Are there diet recommendations that you are having difficulty following? - No  Patient expresses understanding of discharge instructions and education provided has no other needs at this time.    [facility] Guide, Embedded Care Coordination [facility]  [place], [place] [address]  Main Phone: [phone]  E-mail: [email or web address]  Website: [email or web address]

## 2022-06-26 ENCOUNTER — Telehealth: Payer: Self-pay

## 2022-06-26 DIAGNOSIS — I4891 Unspecified atrial fibrillation: Secondary | ICD-10-CM | POA: Diagnosis not present

## 2022-06-26 DIAGNOSIS — L03115 Cellulitis of right lower limb: Secondary | ICD-10-CM | POA: Diagnosis not present

## 2022-06-26 DIAGNOSIS — I2510 Atherosclerotic heart disease of native coronary artery without angina pectoris: Secondary | ICD-10-CM | POA: Diagnosis not present

## 2022-06-26 DIAGNOSIS — F419 Anxiety disorder, unspecified: Secondary | ICD-10-CM | POA: Diagnosis not present

## 2022-06-26 DIAGNOSIS — J439 Emphysema, unspecified: Secondary | ICD-10-CM | POA: Diagnosis not present

## 2022-06-26 DIAGNOSIS — I4719 Other supraventricular tachycardia: Secondary | ICD-10-CM | POA: Diagnosis not present

## 2022-06-26 DIAGNOSIS — R7881 Bacteremia: Secondary | ICD-10-CM | POA: Diagnosis not present

## 2022-06-26 DIAGNOSIS — I442 Atrioventricular block, complete: Secondary | ICD-10-CM | POA: Diagnosis not present

## 2022-06-26 DIAGNOSIS — N289 Disorder of kidney and ureter, unspecified: Secondary | ICD-10-CM | POA: Diagnosis not present

## 2022-06-26 NOTE — Telephone Encounter (Signed)
Meriam Sprague from [facility] called to make Dr. Sedalia Muta aware that her ABI appointment was declined.  They have several upcoming appointments and do not want this test at this time.

## 2022-06-28 DIAGNOSIS — J4489 Other specified chronic obstructive pulmonary disease: Secondary | ICD-10-CM | POA: Diagnosis not present

## 2022-06-28 DIAGNOSIS — R7881 Bacteremia: Secondary | ICD-10-CM | POA: Diagnosis not present

## 2022-06-28 DIAGNOSIS — L03115 Cellulitis of right lower limb: Secondary | ICD-10-CM | POA: Diagnosis not present

## 2022-06-28 DIAGNOSIS — L03116 Cellulitis of left lower limb: Secondary | ICD-10-CM | POA: Diagnosis not present

## 2022-06-29 DIAGNOSIS — I4891 Unspecified atrial fibrillation: Secondary | ICD-10-CM | POA: Diagnosis not present

## 2022-06-29 DIAGNOSIS — I2510 Atherosclerotic heart disease of native coronary artery without angina pectoris: Secondary | ICD-10-CM | POA: Diagnosis not present

## 2022-06-29 DIAGNOSIS — R7881 Bacteremia: Secondary | ICD-10-CM | POA: Diagnosis not present

## 2022-06-29 DIAGNOSIS — F419 Anxiety disorder, unspecified: Secondary | ICD-10-CM | POA: Diagnosis not present

## 2022-06-29 DIAGNOSIS — L03115 Cellulitis of right lower limb: Secondary | ICD-10-CM | POA: Diagnosis not present

## 2022-06-29 DIAGNOSIS — J439 Emphysema, unspecified: Secondary | ICD-10-CM | POA: Diagnosis not present

## 2022-06-30 DIAGNOSIS — F419 Anxiety disorder, unspecified: Secondary | ICD-10-CM | POA: Diagnosis not present

## 2022-06-30 DIAGNOSIS — I4891 Unspecified atrial fibrillation: Secondary | ICD-10-CM | POA: Diagnosis not present

## 2022-06-30 DIAGNOSIS — N289 Disorder of kidney and ureter, unspecified: Secondary | ICD-10-CM | POA: Diagnosis not present

## 2022-06-30 DIAGNOSIS — I2510 Atherosclerotic heart disease of native coronary artery without angina pectoris: Secondary | ICD-10-CM | POA: Diagnosis not present

## 2022-06-30 DIAGNOSIS — L03115 Cellulitis of right lower limb: Secondary | ICD-10-CM | POA: Diagnosis not present

## 2022-06-30 DIAGNOSIS — J439 Emphysema, unspecified: Secondary | ICD-10-CM | POA: Diagnosis not present

## 2022-06-30 DIAGNOSIS — R7881 Bacteremia: Secondary | ICD-10-CM | POA: Diagnosis not present

## 2022-07-03 ENCOUNTER — Other Ambulatory Visit: Payer: Self-pay | Admitting: Endocrinology

## 2022-07-03 DIAGNOSIS — J439 Emphysema, unspecified: Secondary | ICD-10-CM | POA: Diagnosis not present

## 2022-07-03 DIAGNOSIS — I2510 Atherosclerotic heart disease of native coronary artery without angina pectoris: Secondary | ICD-10-CM | POA: Diagnosis not present

## 2022-07-03 DIAGNOSIS — B999 Unspecified infectious disease: Secondary | ICD-10-CM | POA: Diagnosis not present

## 2022-07-03 DIAGNOSIS — N289 Disorder of kidney and ureter, unspecified: Secondary | ICD-10-CM | POA: Diagnosis not present

## 2022-07-03 DIAGNOSIS — R7881 Bacteremia: Secondary | ICD-10-CM | POA: Diagnosis not present

## 2022-07-03 DIAGNOSIS — E119 Type 2 diabetes mellitus without complications: Secondary | ICD-10-CM

## 2022-07-03 DIAGNOSIS — L03115 Cellulitis of right lower limb: Secondary | ICD-10-CM | POA: Diagnosis not present

## 2022-07-03 DIAGNOSIS — F419 Anxiety disorder, unspecified: Secondary | ICD-10-CM | POA: Diagnosis not present

## 2022-07-03 DIAGNOSIS — I4891 Unspecified atrial fibrillation: Secondary | ICD-10-CM | POA: Diagnosis not present

## 2022-07-03 NOTE — Telephone Encounter (Signed)
Please set up at [facility] long hospital d/t reduced EF RG

## 2022-07-04 DIAGNOSIS — I4891 Unspecified atrial fibrillation: Secondary | ICD-10-CM | POA: Diagnosis not present

## 2022-07-04 DIAGNOSIS — F419 Anxiety disorder, unspecified: Secondary | ICD-10-CM | POA: Diagnosis not present

## 2022-07-04 DIAGNOSIS — R918 Other nonspecific abnormal finding of lung field: Secondary | ICD-10-CM | POA: Diagnosis not present

## 2022-07-04 DIAGNOSIS — I2510 Atherosclerotic heart disease of native coronary artery without angina pectoris: Secondary | ICD-10-CM | POA: Diagnosis not present

## 2022-07-04 DIAGNOSIS — J439 Emphysema, unspecified: Secondary | ICD-10-CM | POA: Diagnosis not present

## 2022-07-04 DIAGNOSIS — L03115 Cellulitis of right lower limb: Secondary | ICD-10-CM | POA: Diagnosis not present

## 2022-07-04 DIAGNOSIS — R7881 Bacteremia: Secondary | ICD-10-CM | POA: Diagnosis not present

## 2022-07-07 DIAGNOSIS — L03115 Cellulitis of right lower limb: Secondary | ICD-10-CM | POA: Diagnosis not present

## 2022-07-07 DIAGNOSIS — Z45010 Encounter for checking and testing of cardiac pacemaker pulse generator [battery]: Secondary | ICD-10-CM | POA: Diagnosis not present

## 2022-07-07 DIAGNOSIS — I4891 Unspecified atrial fibrillation: Secondary | ICD-10-CM | POA: Diagnosis not present

## 2022-07-07 DIAGNOSIS — I2510 Atherosclerotic heart disease of native coronary artery without angina pectoris: Secondary | ICD-10-CM | POA: Diagnosis not present

## 2022-07-07 DIAGNOSIS — F419 Anxiety disorder, unspecified: Secondary | ICD-10-CM | POA: Diagnosis not present

## 2022-07-07 DIAGNOSIS — R7881 Bacteremia: Secondary | ICD-10-CM | POA: Diagnosis not present

## 2022-07-07 DIAGNOSIS — J439 Emphysema, unspecified: Secondary | ICD-10-CM | POA: Diagnosis not present

## 2022-07-08 DIAGNOSIS — R918 Other nonspecific abnormal finding of lung field: Secondary | ICD-10-CM | POA: Diagnosis not present

## 2022-07-08 DIAGNOSIS — Z7984 Long term (current) use of oral hypoglycemic drugs: Secondary | ICD-10-CM | POA: Diagnosis not present

## 2022-07-08 DIAGNOSIS — R062 Wheezing: Secondary | ICD-10-CM | POA: Diagnosis not present

## 2022-07-08 DIAGNOSIS — Z681 Body mass index (BMI) 19 or less, adult: Secondary | ICD-10-CM | POA: Diagnosis not present

## 2022-07-08 DIAGNOSIS — F32A Depression, unspecified: Secondary | ICD-10-CM | POA: Diagnosis not present

## 2022-07-08 DIAGNOSIS — R0603 Acute respiratory distress: Secondary | ICD-10-CM | POA: Diagnosis not present

## 2022-07-08 DIAGNOSIS — E785 Hyperlipidemia, unspecified: Secondary | ICD-10-CM | POA: Diagnosis not present

## 2022-07-08 DIAGNOSIS — Z79899 Other long term (current) drug therapy: Secondary | ICD-10-CM | POA: Diagnosis not present

## 2022-07-08 DIAGNOSIS — I509 Heart failure, unspecified: Secondary | ICD-10-CM | POA: Diagnosis not present

## 2022-07-08 DIAGNOSIS — J811 Chronic pulmonary edema: Secondary | ICD-10-CM | POA: Diagnosis not present

## 2022-07-08 DIAGNOSIS — I5023 Acute on chronic systolic (congestive) heart failure: Secondary | ICD-10-CM | POA: Diagnosis not present

## 2022-07-08 DIAGNOSIS — R7881 Bacteremia: Secondary | ICD-10-CM | POA: Diagnosis not present

## 2022-07-08 DIAGNOSIS — I5021 Acute systolic (congestive) heart failure: Secondary | ICD-10-CM | POA: Diagnosis not present

## 2022-07-08 DIAGNOSIS — I083 Combined rheumatic disorders of mitral, aortic and tricuspid valves: Secondary | ICD-10-CM | POA: Diagnosis not present

## 2022-07-08 DIAGNOSIS — I82611 Acute embolism and thrombosis of superficial veins of right upper extremity: Secondary | ICD-10-CM | POA: Diagnosis not present

## 2022-07-08 DIAGNOSIS — I110 Hypertensive heart disease with heart failure: Secondary | ICD-10-CM | POA: Diagnosis not present

## 2022-07-08 DIAGNOSIS — I130 Hypertensive heart and chronic kidney disease with heart failure and stage 1 through stage 4 chronic kidney disease, or unspecified chronic kidney disease: Secondary | ICD-10-CM | POA: Diagnosis not present

## 2022-07-08 DIAGNOSIS — R0602 Shortness of breath: Secondary | ICD-10-CM | POA: Diagnosis not present

## 2022-07-08 DIAGNOSIS — J918 Pleural effusion in other conditions classified elsewhere: Secondary | ICD-10-CM | POA: Diagnosis not present

## 2022-07-08 DIAGNOSIS — I428 Other cardiomyopathies: Secondary | ICD-10-CM | POA: Diagnosis not present

## 2022-07-08 DIAGNOSIS — I5020 Unspecified systolic (congestive) heart failure: Secondary | ICD-10-CM | POA: Diagnosis not present

## 2022-07-08 DIAGNOSIS — A4901 Methicillin susceptible Staphylococcus aureus infection, unspecified site: Secondary | ICD-10-CM | POA: Diagnosis not present

## 2022-07-08 DIAGNOSIS — E43 Unspecified severe protein-calorie malnutrition: Secondary | ICD-10-CM | POA: Diagnosis not present

## 2022-07-08 DIAGNOSIS — Z950 Presence of cardiac pacemaker: Secondary | ICD-10-CM | POA: Diagnosis not present

## 2022-07-08 DIAGNOSIS — I214 Non-ST elevation (NSTEMI) myocardial infarction: Secondary | ICD-10-CM | POA: Diagnosis not present

## 2022-07-08 DIAGNOSIS — Z8673 Personal history of transient ischemic attack (TIA), and cerebral infarction without residual deficits: Secondary | ICD-10-CM | POA: Diagnosis not present

## 2022-07-08 DIAGNOSIS — Z90710 Acquired absence of both cervix and uterus: Secondary | ICD-10-CM | POA: Diagnosis not present

## 2022-07-08 DIAGNOSIS — B9561 Methicillin susceptible Staphylococcus aureus infection as the cause of diseases classified elsewhere: Secondary | ICD-10-CM | POA: Diagnosis not present

## 2022-07-08 DIAGNOSIS — I340 Nonrheumatic mitral (valve) insufficiency: Secondary | ICD-10-CM | POA: Diagnosis not present

## 2022-07-08 DIAGNOSIS — J189 Pneumonia, unspecified organism: Secondary | ICD-10-CM | POA: Diagnosis not present

## 2022-07-08 DIAGNOSIS — T827XXA Infection and inflammatory reaction due to other cardiac and vascular devices, implants and grafts, initial encounter: Secondary | ICD-10-CM | POA: Diagnosis not present

## 2022-07-08 DIAGNOSIS — I4892 Unspecified atrial flutter: Secondary | ICD-10-CM | POA: Diagnosis not present

## 2022-07-08 DIAGNOSIS — I5022 Chronic systolic (congestive) heart failure: Secondary | ICD-10-CM | POA: Diagnosis not present

## 2022-07-08 DIAGNOSIS — F329 Major depressive disorder, single episode, unspecified: Secondary | ICD-10-CM | POA: Diagnosis not present

## 2022-07-08 DIAGNOSIS — I4719 Other supraventricular tachycardia: Secondary | ICD-10-CM | POA: Diagnosis not present

## 2022-07-08 DIAGNOSIS — J9601 Acute respiratory failure with hypoxia: Secondary | ICD-10-CM | POA: Diagnosis not present

## 2022-07-08 DIAGNOSIS — E8729 Other acidosis: Secondary | ICD-10-CM | POA: Diagnosis not present

## 2022-07-08 DIAGNOSIS — J90 Pleural effusion, not elsewhere classified: Secondary | ICD-10-CM | POA: Diagnosis not present

## 2022-07-08 DIAGNOSIS — I493 Ventricular premature depolarization: Secondary | ICD-10-CM | POA: Diagnosis not present

## 2022-07-08 DIAGNOSIS — Z8614 Personal history of Methicillin resistant Staphylococcus aureus infection: Secondary | ICD-10-CM | POA: Diagnosis not present

## 2022-07-08 DIAGNOSIS — J80 Acute respiratory distress syndrome: Secondary | ICD-10-CM | POA: Diagnosis not present

## 2022-07-08 DIAGNOSIS — J9600 Acute respiratory failure, unspecified whether with hypoxia or hypercapnia: Secondary | ICD-10-CM | POA: Diagnosis not present

## 2022-07-08 DIAGNOSIS — I442 Atrioventricular block, complete: Secondary | ICD-10-CM | POA: Diagnosis not present

## 2022-07-08 DIAGNOSIS — Z86718 Personal history of other venous thrombosis and embolism: Secondary | ICD-10-CM | POA: Diagnosis not present

## 2022-07-08 DIAGNOSIS — R0902 Hypoxemia: Secondary | ICD-10-CM | POA: Diagnosis not present

## 2022-07-08 DIAGNOSIS — Z515 Encounter for palliative care: Secondary | ICD-10-CM | POA: Diagnosis not present

## 2022-07-08 DIAGNOSIS — Z7982 Long term (current) use of aspirin: Secondary | ICD-10-CM | POA: Diagnosis not present

## 2022-07-08 DIAGNOSIS — Z7901 Long term (current) use of anticoagulants: Secondary | ICD-10-CM | POA: Diagnosis not present

## 2022-07-08 DIAGNOSIS — R069 Unspecified abnormalities of breathing: Secondary | ICD-10-CM | POA: Diagnosis not present

## 2022-07-08 DIAGNOSIS — I429 Cardiomyopathy, unspecified: Secondary | ICD-10-CM | POA: Diagnosis not present

## 2022-07-08 DIAGNOSIS — I959 Hypotension, unspecified: Secondary | ICD-10-CM | POA: Diagnosis not present

## 2022-07-08 DIAGNOSIS — I272 Pulmonary hypertension, unspecified: Secondary | ICD-10-CM | POA: Diagnosis not present

## 2022-07-08 DIAGNOSIS — Z20822 Contact with and (suspected) exposure to covid-19: Secondary | ICD-10-CM | POA: Diagnosis not present

## 2022-07-08 DIAGNOSIS — Z66 Do not resuscitate: Secondary | ICD-10-CM | POA: Diagnosis not present

## 2022-07-08 DIAGNOSIS — N1832 Chronic kidney disease, stage 3b: Secondary | ICD-10-CM | POA: Diagnosis not present

## 2022-07-08 DIAGNOSIS — I21A1 Myocardial infarction type 2: Secondary | ICD-10-CM | POA: Diagnosis not present

## 2022-07-08 DIAGNOSIS — I10 Essential (primary) hypertension: Secondary | ICD-10-CM | POA: Diagnosis not present

## 2022-07-08 DIAGNOSIS — R7989 Other specified abnormal findings of blood chemistry: Secondary | ICD-10-CM | POA: Diagnosis not present

## 2022-07-10 ENCOUNTER — Other Ambulatory Visit: Payer: Self-pay

## 2022-07-10 MED ORDER — LOVASTATIN 40 MG PO TABS: 40.0000 mg | ORAL_TABLET | Freq: Every day | ORAL | 1 refills | Status: DC

## 2022-07-11 NOTE — Telephone Encounter (Signed)
Spoke with Pt husband Nila Nephew stated that pt is currently in the Hospital in [place], Laban Emperor was notified of Dr. Chales Abrahams recommendations: Laban Emperor stated that he did not know anything about this and that the pt is not having any issues with swallowing: Pt last EGD done 04/13/2022: Please advise

## 2022-07-13 NOTE — Telephone Encounter (Signed)
Left message for pt husband to call back:  

## 2022-07-13 NOTE — Telephone Encounter (Signed)
Thanks for letting me know. Since he is not having any problems swallowing, hold off on EGD RG

## 2022-07-14 NOTE — Telephone Encounter (Signed)
Spoke to Pt husband Darrell and notified him of Dr. Chales Abrahams recommendations: Pt verbalized understanding with all questions answered.

## 2022-07-16 DIAGNOSIS — B9561 Methicillin susceptible Staphylococcus aureus infection as the cause of diseases classified elsewhere: Secondary | ICD-10-CM | POA: Diagnosis not present

## 2022-07-16 DIAGNOSIS — I110 Hypertensive heart disease with heart failure: Secondary | ICD-10-CM | POA: Diagnosis not present

## 2022-07-16 DIAGNOSIS — R7881 Bacteremia: Secondary | ICD-10-CM | POA: Diagnosis not present

## 2022-07-16 DIAGNOSIS — I5023 Acute on chronic systolic (congestive) heart failure: Secondary | ICD-10-CM | POA: Diagnosis not present

## 2022-07-17 DIAGNOSIS — I130 Hypertensive heart and chronic kidney disease with heart failure and stage 1 through stage 4 chronic kidney disease, or unspecified chronic kidney disease: Secondary | ICD-10-CM | POA: Diagnosis not present

## 2022-07-17 DIAGNOSIS — R1319 Other dysphagia: Secondary | ICD-10-CM | POA: Diagnosis not present

## 2022-07-17 DIAGNOSIS — J440 Chronic obstructive pulmonary disease with acute lower respiratory infection: Secondary | ICD-10-CM | POA: Diagnosis not present

## 2022-07-17 DIAGNOSIS — J4530 Mild persistent asthma, uncomplicated: Secondary | ICD-10-CM | POA: Diagnosis not present

## 2022-07-17 DIAGNOSIS — J918 Pleural effusion in other conditions classified elsewhere: Secondary | ICD-10-CM | POA: Diagnosis not present

## 2022-07-17 DIAGNOSIS — R7881 Bacteremia: Secondary | ICD-10-CM | POA: Diagnosis not present

## 2022-07-17 DIAGNOSIS — D631 Anemia in chronic kidney disease: Secondary | ICD-10-CM | POA: Diagnosis not present

## 2022-07-17 DIAGNOSIS — J439 Emphysema, unspecified: Secondary | ICD-10-CM | POA: Diagnosis not present

## 2022-07-17 DIAGNOSIS — I5023 Acute on chronic systolic (congestive) heart failure: Secondary | ICD-10-CM | POA: Diagnosis not present

## 2022-07-17 DIAGNOSIS — M810 Age-related osteoporosis without current pathological fracture: Secondary | ICD-10-CM | POA: Diagnosis not present

## 2022-07-17 DIAGNOSIS — I2510 Atherosclerotic heart disease of native coronary artery without angina pectoris: Secondary | ICD-10-CM | POA: Diagnosis not present

## 2022-07-17 DIAGNOSIS — J9601 Acute respiratory failure with hypoxia: Secondary | ICD-10-CM | POA: Diagnosis not present

## 2022-07-17 DIAGNOSIS — N1832 Chronic kidney disease, stage 3b: Secondary | ICD-10-CM | POA: Diagnosis not present

## 2022-07-17 DIAGNOSIS — E782 Mixed hyperlipidemia: Secondary | ICD-10-CM | POA: Diagnosis not present

## 2022-07-17 DIAGNOSIS — I051 Rheumatic mitral insufficiency: Secondary | ICD-10-CM | POA: Diagnosis not present

## 2022-07-17 DIAGNOSIS — E041 Nontoxic single thyroid nodule: Secondary | ICD-10-CM | POA: Diagnosis not present

## 2022-07-17 DIAGNOSIS — I4891 Unspecified atrial fibrillation: Secondary | ICD-10-CM | POA: Diagnosis not present

## 2022-07-17 DIAGNOSIS — E1122 Type 2 diabetes mellitus with diabetic chronic kidney disease: Secondary | ICD-10-CM | POA: Diagnosis not present

## 2022-07-17 DIAGNOSIS — J189 Pneumonia, unspecified organism: Secondary | ICD-10-CM | POA: Diagnosis not present

## 2022-07-17 DIAGNOSIS — I428 Other cardiomyopathies: Secondary | ICD-10-CM | POA: Diagnosis not present

## 2022-07-17 DIAGNOSIS — I21A1 Myocardial infarction type 2: Secondary | ICD-10-CM | POA: Diagnosis not present

## 2022-07-17 DIAGNOSIS — F330 Major depressive disorder, recurrent, mild: Secondary | ICD-10-CM | POA: Diagnosis not present

## 2022-07-17 DIAGNOSIS — B9561 Methicillin susceptible Staphylococcus aureus infection as the cause of diseases classified elsewhere: Secondary | ICD-10-CM | POA: Diagnosis not present

## 2022-07-17 DIAGNOSIS — K5909 Other constipation: Secondary | ICD-10-CM | POA: Diagnosis not present

## 2022-07-17 DIAGNOSIS — T827XXA Infection and inflammatory reaction due to other cardiac and vascular devices, implants and grafts, initial encounter: Secondary | ICD-10-CM | POA: Diagnosis not present

## 2022-07-18 DIAGNOSIS — N1832 Chronic kidney disease, stage 3b: Secondary | ICD-10-CM | POA: Diagnosis not present

## 2022-07-18 DIAGNOSIS — R7881 Bacteremia: Secondary | ICD-10-CM | POA: Diagnosis not present

## 2022-07-18 DIAGNOSIS — B9561 Methicillin susceptible Staphylococcus aureus infection as the cause of diseases classified elsewhere: Secondary | ICD-10-CM | POA: Diagnosis not present

## 2022-07-18 DIAGNOSIS — E875 Hyperkalemia: Secondary | ICD-10-CM | POA: Diagnosis not present

## 2022-07-18 DIAGNOSIS — N179 Acute kidney failure, unspecified: Secondary | ICD-10-CM | POA: Diagnosis not present

## 2022-07-18 DIAGNOSIS — I130 Hypertensive heart and chronic kidney disease with heart failure and stage 1 through stage 4 chronic kidney disease, or unspecified chronic kidney disease: Secondary | ICD-10-CM | POA: Diagnosis not present

## 2022-07-18 DIAGNOSIS — I5020 Unspecified systolic (congestive) heart failure: Secondary | ICD-10-CM | POA: Diagnosis not present

## 2022-07-19 ENCOUNTER — Telehealth: Payer: Self-pay

## 2022-07-19 ENCOUNTER — Other Ambulatory Visit: Payer: Self-pay

## 2022-07-19 DIAGNOSIS — I130 Hypertensive heart and chronic kidney disease with heart failure and stage 1 through stage 4 chronic kidney disease, or unspecified chronic kidney disease: Secondary | ICD-10-CM | POA: Diagnosis not present

## 2022-07-19 DIAGNOSIS — E1122 Type 2 diabetes mellitus with diabetic chronic kidney disease: Secondary | ICD-10-CM | POA: Diagnosis not present

## 2022-07-19 DIAGNOSIS — I5023 Acute on chronic systolic (congestive) heart failure: Secondary | ICD-10-CM | POA: Diagnosis not present

## 2022-07-19 DIAGNOSIS — J918 Pleural effusion in other conditions classified elsewhere: Secondary | ICD-10-CM | POA: Diagnosis not present

## 2022-07-19 DIAGNOSIS — N1832 Chronic kidney disease, stage 3b: Secondary | ICD-10-CM | POA: Diagnosis not present

## 2022-07-19 DIAGNOSIS — D631 Anemia in chronic kidney disease: Secondary | ICD-10-CM | POA: Diagnosis not present

## 2022-07-19 NOTE — Progress Notes (Signed)
Error

## 2022-07-20 DIAGNOSIS — D631 Anemia in chronic kidney disease: Secondary | ICD-10-CM | POA: Diagnosis not present

## 2022-07-20 DIAGNOSIS — J918 Pleural effusion in other conditions classified elsewhere: Secondary | ICD-10-CM | POA: Diagnosis not present

## 2022-07-20 DIAGNOSIS — I5023 Acute on chronic systolic (congestive) heart failure: Secondary | ICD-10-CM | POA: Diagnosis not present

## 2022-07-20 DIAGNOSIS — N1832 Chronic kidney disease, stage 3b: Secondary | ICD-10-CM | POA: Diagnosis not present

## 2022-07-20 DIAGNOSIS — E1122 Type 2 diabetes mellitus with diabetic chronic kidney disease: Secondary | ICD-10-CM | POA: Diagnosis not present

## 2022-07-20 DIAGNOSIS — I130 Hypertensive heart and chronic kidney disease with heart failure and stage 1 through stage 4 chronic kidney disease, or unspecified chronic kidney disease: Secondary | ICD-10-CM | POA: Diagnosis not present

## 2022-07-20 MED ORDER — TORSEMIDE 20 MG PO TABS: 10.0000 mg | ORAL_TABLET | Freq: Every day | ORAL | 1 refills | Status: DC

## 2022-07-24 ENCOUNTER — Other Ambulatory Visit: Payer: Self-pay

## 2022-07-24 ENCOUNTER — Other Ambulatory Visit: Payer: Self-pay | Admitting: Family Medicine

## 2022-07-24 DIAGNOSIS — I5023 Acute on chronic systolic (congestive) heart failure: Secondary | ICD-10-CM | POA: Diagnosis not present

## 2022-07-24 DIAGNOSIS — I130 Hypertensive heart and chronic kidney disease with heart failure and stage 1 through stage 4 chronic kidney disease, or unspecified chronic kidney disease: Secondary | ICD-10-CM | POA: Diagnosis not present

## 2022-07-24 DIAGNOSIS — F330 Major depressive disorder, recurrent, mild: Secondary | ICD-10-CM

## 2022-07-24 DIAGNOSIS — J918 Pleural effusion in other conditions classified elsewhere: Secondary | ICD-10-CM | POA: Diagnosis not present

## 2022-07-24 DIAGNOSIS — N1832 Chronic kidney disease, stage 3b: Secondary | ICD-10-CM | POA: Diagnosis not present

## 2022-07-24 DIAGNOSIS — D631 Anemia in chronic kidney disease: Secondary | ICD-10-CM | POA: Diagnosis not present

## 2022-07-24 DIAGNOSIS — E1122 Type 2 diabetes mellitus with diabetic chronic kidney disease: Secondary | ICD-10-CM | POA: Diagnosis not present

## 2022-07-24 MED ORDER — VENLAFAXINE HCL ER 75 MG PO CP24: 75.0000 mg | ORAL_CAPSULE | Freq: Every day | ORAL | 0 refills | Status: DC

## 2022-07-25 DIAGNOSIS — I5022 Chronic systolic (congestive) heart failure: Secondary | ICD-10-CM | POA: Diagnosis not present

## 2022-07-25 DIAGNOSIS — R918 Other nonspecific abnormal finding of lung field: Secondary | ICD-10-CM | POA: Diagnosis not present

## 2022-07-25 DIAGNOSIS — N1832 Chronic kidney disease, stage 3b: Secondary | ICD-10-CM | POA: Diagnosis not present

## 2022-07-25 DIAGNOSIS — Z45010 Encounter for checking and testing of cardiac pacemaker pulse generator [battery]: Secondary | ICD-10-CM | POA: Diagnosis not present

## 2022-07-25 DIAGNOSIS — I4719 Other supraventricular tachycardia: Secondary | ICD-10-CM | POA: Diagnosis not present

## 2022-07-25 DIAGNOSIS — I129 Hypertensive chronic kidney disease with stage 1 through stage 4 chronic kidney disease, or unspecified chronic kidney disease: Secondary | ICD-10-CM | POA: Diagnosis not present

## 2022-07-25 DIAGNOSIS — F338 Other recurrent depressive disorders: Secondary | ICD-10-CM | POA: Diagnosis not present

## 2022-07-25 DIAGNOSIS — J90 Pleural effusion, not elsewhere classified: Secondary | ICD-10-CM | POA: Diagnosis not present

## 2022-07-25 DIAGNOSIS — Z86718 Personal history of other venous thrombosis and embolism: Secondary | ICD-10-CM | POA: Diagnosis not present

## 2022-07-25 DIAGNOSIS — Z950 Presence of cardiac pacemaker: Secondary | ICD-10-CM | POA: Diagnosis not present

## 2022-07-25 DIAGNOSIS — I340 Nonrheumatic mitral (valve) insufficiency: Secondary | ICD-10-CM | POA: Diagnosis not present

## 2022-07-25 DIAGNOSIS — I5023 Acute on chronic systolic (congestive) heart failure: Secondary | ICD-10-CM | POA: Diagnosis not present

## 2022-07-25 DIAGNOSIS — E785 Hyperlipidemia, unspecified: Secondary | ICD-10-CM | POA: Diagnosis not present

## 2022-07-25 DIAGNOSIS — I442 Atrioventricular block, complete: Secondary | ICD-10-CM | POA: Diagnosis not present

## 2022-07-25 DIAGNOSIS — I130 Hypertensive heart and chronic kidney disease with heart failure and stage 1 through stage 4 chronic kidney disease, or unspecified chronic kidney disease: Secondary | ICD-10-CM | POA: Diagnosis not present

## 2022-07-25 DIAGNOSIS — R7989 Other specified abnormal findings of blood chemistry: Secondary | ICD-10-CM | POA: Diagnosis not present

## 2022-07-26 DIAGNOSIS — I5023 Acute on chronic systolic (congestive) heart failure: Secondary | ICD-10-CM | POA: Diagnosis not present

## 2022-07-26 DIAGNOSIS — D631 Anemia in chronic kidney disease: Secondary | ICD-10-CM | POA: Diagnosis not present

## 2022-07-26 DIAGNOSIS — J918 Pleural effusion in other conditions classified elsewhere: Secondary | ICD-10-CM | POA: Diagnosis not present

## 2022-07-26 DIAGNOSIS — I130 Hypertensive heart and chronic kidney disease with heart failure and stage 1 through stage 4 chronic kidney disease, or unspecified chronic kidney disease: Secondary | ICD-10-CM | POA: Diagnosis not present

## 2022-07-26 DIAGNOSIS — E1122 Type 2 diabetes mellitus with diabetic chronic kidney disease: Secondary | ICD-10-CM | POA: Diagnosis not present

## 2022-07-26 DIAGNOSIS — N1832 Chronic kidney disease, stage 3b: Secondary | ICD-10-CM | POA: Diagnosis not present

## 2022-07-28 NOTE — Telephone Encounter (Signed)
Joni Reining from [facility] called to make [name] aware that Bridget [name] was discharged from them today.  She will continue follow-up care outpatient.

## 2022-08-01 DIAGNOSIS — Z792 Long term (current) use of antibiotics: Secondary | ICD-10-CM | POA: Diagnosis not present

## 2022-08-01 DIAGNOSIS — Z45018 Encounter for adjustment and management of other part of cardiac pacemaker: Secondary | ICD-10-CM | POA: Diagnosis not present

## 2022-08-01 DIAGNOSIS — I517 Cardiomegaly: Secondary | ICD-10-CM | POA: Diagnosis not present

## 2022-08-01 DIAGNOSIS — J9601 Acute respiratory failure with hypoxia: Secondary | ICD-10-CM | POA: Diagnosis not present

## 2022-08-01 DIAGNOSIS — R7881 Bacteremia: Secondary | ICD-10-CM | POA: Diagnosis not present

## 2022-08-01 DIAGNOSIS — I5023 Acute on chronic systolic (congestive) heart failure: Secondary | ICD-10-CM | POA: Diagnosis not present

## 2022-08-01 DIAGNOSIS — B9561 Methicillin susceptible Staphylococcus aureus infection as the cause of diseases classified elsewhere: Secondary | ICD-10-CM | POA: Diagnosis not present

## 2022-08-02 DIAGNOSIS — I5023 Acute on chronic systolic (congestive) heart failure: Secondary | ICD-10-CM | POA: Diagnosis not present

## 2022-08-02 DIAGNOSIS — E1122 Type 2 diabetes mellitus with diabetic chronic kidney disease: Secondary | ICD-10-CM | POA: Diagnosis not present

## 2022-08-02 DIAGNOSIS — I130 Hypertensive heart and chronic kidney disease with heart failure and stage 1 through stage 4 chronic kidney disease, or unspecified chronic kidney disease: Secondary | ICD-10-CM | POA: Diagnosis not present

## 2022-08-16 ENCOUNTER — Encounter: Payer: Self-pay | Admitting: Endocrinology

## 2022-08-16 ENCOUNTER — Ambulatory Visit (INDEPENDENT_AMBULATORY_CARE_PROVIDER_SITE_OTHER): Payer: Medicare Other | Admitting: Endocrinology

## 2022-08-16 VITALS — BP 114/58 | HR 70 | Ht 62.0 in | Wt 94.0 lb

## 2022-08-16 DIAGNOSIS — E1140 Type 2 diabetes mellitus with diabetic neuropathy, unspecified: Secondary | ICD-10-CM

## 2022-08-16 DIAGNOSIS — E1165 Type 2 diabetes mellitus with hyperglycemia: Secondary | ICD-10-CM | POA: Diagnosis not present

## 2022-08-16 MED ORDER — GABAPENTIN 100 MG PO CAPS: 100.0000 mg | ORAL_CAPSULE | Freq: Every day | ORAL | 3 refills | Status: DC

## 2022-08-16 NOTE — Patient Instructions (Addendum)
Check blood sugars on waking up 3 days a week  Also check blood sugars about 2 hours after meals and do this after different meals by rotation  Recommended blood sugar levels on waking up are 90-130 and about 2 hours after meal is 130-180  Please bring your blood sugar monitor to each visit, thank you  Gabapentin at nite

## 2022-08-16 NOTE — Progress Notes (Signed)
Patient ID: Bridget [name], female   DOB: 1940-10-07, 82 y.o.   MRN: [id number]   Reason for Appointment: follow-up   History of Present Illness   Diagnosis: Type 2 DIABETES MELITUS, date of diagnosis:  2007     Previous history: Her A1c at diagnosis was 7.9% She has been treated with a combination of metformin ER and Actos for several years Usually her A1c has been upper normal and her last A1c was 5.8 in 4/14  Recent history:  Her A1c is 7.4 as of October done elsewhere  Oral hypoglycemic drugs:   metformin  500 mg, twice daily, Januvia 10 mg daily  She has apparently started her Jardiance sometime after her last visit in September Does not think she has had any side effects from this or declining blood pressure She is still checking blood sugars only in the mornings despite reminders about checking after meals  Overall fasting blood sugars are significantly better compared to her last visit Also no side effects with metformin She is eating smaller portions and still losing weight, also was admitted for CHF She cannot exercise with walking because of weakness and fatigue She will frequently eat cereal or oatmeal for breakfast  Generally she is reluctant to take any brand-name medication because of cost             Side effects from medications:  Abdominal discomfort from high-dose metformin       Monitors blood glucose:  once a day or less.    Glucometer: One Touch Ultra.           Blood Glucose readings from    PRE-MEAL Fasting Lunch Dinner Bedtime Overall  Glucose range: 82-156   ?   Mean/median:     109   Previously   PRE-MEAL Fasting Lunch Dinner Bedtime Overall  Glucose range: 129-187      Mean/median:     152   POST-MEAL PC Breakfast PC Lunch PC Dinner  Glucose range:     Mean/median:                 Dietician visit: Most recent: 2007            Wt Readings from Last 3 Encounters:  08/16/22 94 lb (42.6 kg)  06/15/22 95 lb 6.4 oz (43.3  kg)  04/25/22 98 lb (44.5 kg)   Lab Results  Component Value Date   HGBA1C 7.4 05/26/2022   HGBA1C 7.4 (H) 03/30/2022   HGBA1C 6.6 (A) 10/26/2021   Lab Results  Component Value Date   MICROALBUR 4.0 (H) 03/25/2021   LDLCALC 114 (H) 03/30/2022   CREATININE 1.0 06/12/2022   Lab Results  Component Value Date   CREATININE 1.0 06/12/2022   CREATININE 1.20 (H) 04/20/2022   CREATININE 1.23 (H) 03/30/2022    Other active problems discussed: See review of systems   Allergies as of 08/16/2022       Reactions   Atorvastatin Other (See Comments)   "muscle Cramps"   Budesonide Other (See Comments)   "fatigue"   Gabapentin Other (See Comments)   "fatigue"   Metoclopramide Other (See Comments)   "mayalgia"   Rosuvastatin Other (See Comments)   "muscle cramps"   Simvastatin Other (See Comments)   "muscle Cramps"   Wellbutrin [bupropion]    Gi upset.    Sulfamethoxazole-trimethoprim Rash        Medication List        Accurate as of August 16, 2022  9:26 AM. If you have any questions, ask your nurse or doctor.          albuterol 108 (90 Base) MCG/ACT inhaler Commonly known as: VENTOLIN HFA INHALE 1 TO 2 PUFFS BY MOUTH EVERY 4 HOURS AS NEEDED FOR WHEEZING   aspirin 81 MG chewable tablet Chew 81 mg by mouth in the morning.   Breztri Aerosphere 160-9-4.8 MCG/ACT Aero Generic drug: Budeson-Glycopyrrol-Formoterol Inhale 2 puffs into the lungs 2 (two) times daily.   calcium-vitamin D 500-200 MG-UNIT Tabs tablet Commonly known as: OSCAL WITH D Take 1 tablet by mouth daily.   carvedilol 12.5 MG tablet Commonly known as: COREG Take 12.5 mg by mouth 2 (two) times daily with a meal.   CENTRUM SILVER ULTRA WOMENS PO Take 1 tablet by mouth daily.   colchicine 0.6 MG tablet Take 1 tablet (0.6 mg total) by mouth 2 (two) times daily.   digoxin 0.125 MG tablet Commonly known as: LANOXIN Take 0.5 tablets (0.0625 mg total) by mouth daily.   empagliflozin 10 MG Tabs  tablet Commonly known as: Jardiance Take 1 tablet (10 mg total) by mouth daily with breakfast.   Entresto 24-26 MG Generic drug: sacubitril-valsartan Take 1 tablet by mouth 2 (two) times daily.   lovastatin 40 MG tablet Commonly known as: MEVACOR Take 1 tablet (40 mg total) by mouth at bedtime.   Magnesium 250 MG Tabs Take 1 tablet (250 mg total) by mouth in the morning and at bedtime.   metFORMIN 500 MG tablet Commonly known as: GLUCOPHAGE TAKE 1 TABLET BY MOUTH TWICE DAILY WITH MORNING MEAL AND WITH EVENING MEAL   nitroGLYCERIN 0.4 MG SL tablet Commonly known as: NITROSTAT DISSOLVE ONE TABLET UNDER THE TONGUE EVERY 5 MINUTES AS NEEDED FOR CHEST PAIN.  DO NOT EXCEED A TOTAL OF 3 DOSES IN 15 MINUTES   omeprazole 20 MG capsule Commonly known as: PRILOSEC Take 1 capsule (20 mg total) by mouth daily.   ondansetron 4 MG tablet Commonly known as: Zofran Take 1 tablet (4 mg total) by mouth every 8 (eight) hours as needed for nausea or vomiting.   OneTouch Delica Lancets 35T Misc USE 1  TO CHECK GLUCOSE ONCE DAILY *APPOINTMENT  REQUIRED  FOR  FUTURE  REFILLS   OneTouch Verio Flex System w/Device Kit Check sugar once daily   OneTouch Verio test strip Generic drug: glucose blood Use to check blood sugar once daily  DX CODE E11.9   Rivaroxaban 15 MG Tabs tablet Commonly known as: XARELTO Take 15 mg by mouth every other day.   Systane Ultra 0.4-0.3 % Soln Generic drug: Polyethyl Glycol-Propyl Glycol Place 1 drop into both eyes as needed (dry eyes).   torsemide 20 MG tablet Commonly known as: DEMADEX Take 0.5 tablets (10 mg total) by mouth daily.   venlafaxine XR 75 MG 24 hr capsule Commonly known as: EFFEXOR-XR Take 1 capsule (75 mg total) by mouth daily with breakfast.   Vitamin D-3 25 MCG (1000 UT) Caps Take 1,000 Units by mouth daily.        Allergies:  Allergies  Allergen Reactions   Atorvastatin Other (See Comments)    "muscle Cramps"   Budesonide Other  (See Comments)    "fatigue"   Gabapentin Other (See Comments)    "fatigue"   Metoclopramide Other (See Comments)    "mayalgia"   Rosuvastatin Other (See Comments)    "muscle cramps"   Simvastatin Other (See Comments)    "muscle Cramps"   Wellbutrin [Bupropion]  Gi upset.    Sulfamethoxazole-Trimethoprim Rash    Past Medical History:  Diagnosis Date   Anemia    Anxiety and depression    Arthritis    HANDS,ARMS,FEET   Asthma    Atrioventricular block    CAD (coronary artery disease)    CHF (congestive heart failure) (HCC)    Chronic renal insufficiency    Clotting disorder (HCC)    Complication of anesthesia    Difficulty waking up once.   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 Otherwise normal colonoscopy   ESOPHAGOGASTRODUODENOSCOPY  11/16/2005   [place]. Patchy areas of mucosal erythema in the antrum compatible with gastritis. Polyps in the stomach. Nodule in the antrum.  Medium hiatal hernia   ESOPHAGOGASTRODUODENOSCOPY (EGD) WITH PROPOFOL N/A 04/13/2022   Procedure: ESOPHAGOGASTRODUODENOSCOPY (EGD) WITH PROPOFOL;  Surgeon: Jackquline Denmark, MD;  Location: [facility];  Service: Gastroenterology;  Laterality: N/A;   MALONEY DILATION  04/13/2022   Procedure: Venia Minks DILATION;  Surgeon: Jackquline Denmark, MD;  Location: [facility];  Service: Gastroenterology;;   pacemaker  09/27/2016   PACEMAKER REVISION  09/27/2020   PACEMAKER UPGRADE DUAL CHAMBER TO BIV    Family History  Problem Relation Age of Onset   High blood pressure Mother    Diabetes Mother        no medication needed   Kidney disease Mother    Pneumonia Mother    Colon polyps Father    High blood pressure Father    Colon cancer Father    Cancer Maternal Grandmother        Leukemia   Cancer Other        Ovarian   Rectal cancer Neg Hx    Stomach cancer Neg Hx    Esophageal cancer Neg Hx    Liver cancer Neg Hx    Pancreatic cancer Neg Hx    Crohn's disease Neg Hx     Social History:  reports that she has never smoked. She has been exposed to tobacco smoke. She has never used smokeless tobacco. She reports that she does not drink alcohol and does not use drugs.  Review of Systems:  Foot pain:  She is complaining of pain in her feet all the time especially at night and sometimes going up to the legs She is unable to  describe the character of the pain but does not have any tingling or numbness Apparently has been given gabapentin, unknown dose in the remote past and only side effect was reportedly fatigue  Hypertension:  blood pressure is treated with Coreg, and, treated by other physicians Also now on Entresto  She has a history of CAD followed by cardiologist  Lipids: She has had statin intolerance with at least atorvastatin, simvastatin and rosuvastatin She was prescribed lovastatin by her PCP, LDL is last improved  Lab Results  Component Value Date   CHOL 216 (H) 03/30/2022    CHOL 232 (H) 12/19/2021   CHOL 144 06/15/2021   Lab Results  Component Value Date   HDL 76 03/30/2022   HDL 71 12/19/2021   HDL 69 06/15/2021   Lab Results  Component Value Date   LDLCALC 114 (H) 03/30/2022   LDLCALC 130 (H) 12/19/2021   LDLCALC 58 06/15/2021   Lab Results  Component Value Date   TRIG 153 (H) 03/30/2022   TRIG 178 (H) 12/19/2021   TRIG 95 06/15/2021   Lab Results  Component Value Date   CHOLHDL 2.8 03/30/2022   CHOLHDL 3.3 12/19/2021   CHOLHDL 2.1 06/15/2021   Lab Results  Component Value Date   LDLDIRECT 94.2 12/05/2013     THYROID:  She was previously on thyroid suppression because of her long-standing benign thyroid nodule.  She has had 2 biopsies done on this previously Because of low normal TSH her 50 mcg Synthroid was stopped in 10/14 TSH has been consistently normal subsequently  She had a small left-sided thyroid nodule palpated on her visit in 12/20 and right thyroid nodule is relatively small  Her ultrasound last showed mostly cystic nodule in the right side about 1.6 cm  Lab Results  Component Value Date   TSH 1.220 04/20/2022   TSH 2.040 07/22/2021   TSH 1.64 03/23/2020   Last foot exam: 3/22    Examination:   BP (!) 114/58   Pulse 70   Ht _0  (1.575 m)   Wt 94 lb (42.6 kg)   SpO2 95%   BMI 17.19 kg/m   Body mass index is 17.19 kg/m.   Mild peripheral cyanosis of her feet present No swelling Skin is intact   ASSESSMENT/ PLAN:   Diabetes type 2 on metformin 500 mg twice a day and Jardiance 10 mg  A1c is 7.4 about 2 months ago  With adding Jardiance her glucose control appears better as judged by her recent fasting reading However A1c was still about the same a couple of months ago ago Given her comorbid conditions and age her level of control is still adequate Fasting readings are averaging 109 recently  She has had no change in blood pressure or renal function with adding Jardiance and will need to continue  this especially with her heart failure  Likely neuropathy: Discussed that gabapentin at bedtime should help her pain in the feet and if not improved need to follow-up with her PCP  There are no Patient Instructions on file for this visit.   Elayne Snare 08/16/2022, 9:26 AM

## 2022-08-22 DIAGNOSIS — Z86718 Personal history of other venous thrombosis and embolism: Secondary | ICD-10-CM | POA: Diagnosis not present

## 2022-08-22 DIAGNOSIS — N183 Chronic kidney disease, stage 3 unspecified: Secondary | ICD-10-CM | POA: Diagnosis not present

## 2022-08-22 DIAGNOSIS — I428 Other cardiomyopathies: Secondary | ICD-10-CM | POA: Diagnosis not present

## 2022-08-22 DIAGNOSIS — I340 Nonrheumatic mitral (valve) insufficiency: Secondary | ICD-10-CM | POA: Diagnosis not present

## 2022-08-22 DIAGNOSIS — E785 Hyperlipidemia, unspecified: Secondary | ICD-10-CM | POA: Diagnosis not present

## 2022-08-22 DIAGNOSIS — I4719 Other supraventricular tachycardia: Secondary | ICD-10-CM | POA: Diagnosis not present

## 2022-08-22 DIAGNOSIS — Z950 Presence of cardiac pacemaker: Secondary | ICD-10-CM | POA: Diagnosis not present

## 2022-08-22 DIAGNOSIS — I5022 Chronic systolic (congestive) heart failure: Secondary | ICD-10-CM | POA: Diagnosis not present

## 2022-08-22 DIAGNOSIS — I130 Hypertensive heart and chronic kidney disease with heart failure and stage 1 through stage 4 chronic kidney disease, or unspecified chronic kidney disease: Secondary | ICD-10-CM | POA: Diagnosis not present

## 2022-08-22 DIAGNOSIS — E119 Type 2 diabetes mellitus without complications: Secondary | ICD-10-CM | POA: Diagnosis not present

## 2022-08-22 DIAGNOSIS — Z45010 Encounter for checking and testing of cardiac pacemaker pulse generator [battery]: Secondary | ICD-10-CM | POA: Diagnosis not present

## 2022-08-24 DIAGNOSIS — I519 Heart disease, unspecified: Secondary | ICD-10-CM | POA: Diagnosis not present

## 2022-08-24 DIAGNOSIS — I5020 Unspecified systolic (congestive) heart failure: Secondary | ICD-10-CM | POA: Diagnosis not present

## 2022-08-24 DIAGNOSIS — D509 Iron deficiency anemia, unspecified: Secondary | ICD-10-CM | POA: Diagnosis not present

## 2022-08-24 DIAGNOSIS — I428 Other cardiomyopathies: Secondary | ICD-10-CM | POA: Diagnosis not present

## 2022-08-24 DIAGNOSIS — Z45018 Encounter for adjustment and management of other part of cardiac pacemaker: Secondary | ICD-10-CM | POA: Diagnosis not present

## 2022-08-24 DIAGNOSIS — I5023 Acute on chronic systolic (congestive) heart failure: Secondary | ICD-10-CM | POA: Diagnosis not present

## 2022-08-29 ENCOUNTER — Other Ambulatory Visit: Payer: Self-pay | Admitting: Endocrinology

## 2022-08-29 DIAGNOSIS — E119 Type 2 diabetes mellitus without complications: Secondary | ICD-10-CM

## 2022-08-30 DIAGNOSIS — Z23 Encounter for immunization: Secondary | ICD-10-CM | POA: Diagnosis not present

## 2022-09-11 DIAGNOSIS — J9601 Acute respiratory failure with hypoxia: Secondary | ICD-10-CM | POA: Diagnosis not present

## 2022-09-11 DIAGNOSIS — I5020 Unspecified systolic (congestive) heart failure: Secondary | ICD-10-CM | POA: Diagnosis not present

## 2022-09-11 DIAGNOSIS — R911 Solitary pulmonary nodule: Secondary | ICD-10-CM | POA: Diagnosis not present

## 2022-09-20 DIAGNOSIS — I110 Hypertensive heart disease with heart failure: Secondary | ICD-10-CM | POA: Diagnosis not present

## 2022-09-20 DIAGNOSIS — I442 Atrioventricular block, complete: Secondary | ICD-10-CM | POA: Diagnosis not present

## 2022-09-20 DIAGNOSIS — I5020 Unspecified systolic (congestive) heart failure: Secondary | ICD-10-CM | POA: Diagnosis not present

## 2022-09-20 DIAGNOSIS — Z950 Presence of cardiac pacemaker: Secondary | ICD-10-CM | POA: Diagnosis not present

## 2022-09-20 DIAGNOSIS — R5383 Other fatigue: Secondary | ICD-10-CM | POA: Diagnosis not present

## 2022-09-20 DIAGNOSIS — I428 Other cardiomyopathies: Secondary | ICD-10-CM | POA: Diagnosis not present

## 2022-09-20 DIAGNOSIS — I519 Heart disease, unspecified: Secondary | ICD-10-CM | POA: Diagnosis not present

## 2022-09-20 DIAGNOSIS — Z9889 Other specified postprocedural states: Secondary | ICD-10-CM | POA: Diagnosis not present

## 2022-09-22 ENCOUNTER — Telehealth: Payer: Self-pay

## 2022-09-22 ENCOUNTER — Encounter: Payer: Self-pay | Admitting: Family Medicine

## 2022-09-22 ENCOUNTER — Other Ambulatory Visit: Payer: Self-pay | Admitting: Family Medicine

## 2022-09-22 ENCOUNTER — Ambulatory Visit (INDEPENDENT_AMBULATORY_CARE_PROVIDER_SITE_OTHER): Payer: Medicare Other | Admitting: Family Medicine

## 2022-09-22 VITALS — BP 124/74 | HR 87 | Temp 97.1°F | Ht 61.0 in | Wt 92.0 lb

## 2022-09-22 DIAGNOSIS — D638 Anemia in other chronic diseases classified elsewhere: Secondary | ICD-10-CM | POA: Diagnosis not present

## 2022-09-22 DIAGNOSIS — D509 Iron deficiency anemia, unspecified: Secondary | ICD-10-CM | POA: Diagnosis not present

## 2022-09-22 DIAGNOSIS — E782 Mixed hyperlipidemia: Secondary | ICD-10-CM

## 2022-09-22 NOTE — Assessment & Plan Note (Signed)
Check labs 

## 2022-09-22 NOTE — Progress Notes (Signed)
Subjective:  Patient ID: Bridget [name], female    DOB: 04/17/1941  Age: 82 y.o. MRN: [id number]  Chief Complaint  Patient presents with   low iron    HPI Patient presents for recheck of low iron, states she been fatigue for several months. Denies any dark stools. Her EP cardiologist told them approximately one month ago and she was supposed to have been set up with iron infusion Per patient we were supposed to receive a message to try to get this set up, but I do not recall a message. Patient has seen Dr. Bobby Rumpf, hematology in the past. We will try to reach out to Hematology to get set up.  Patient is exhausted.      09/22/2022    8:23 AM 09/22/2022    8:22 AM 09/22/2022    8:04 AM 04/20/2022    8:22 AM 03/30/2022    8:22 AM  Depression screen PHQ 2/9  Decreased Interest  0 0 0 1  Down, Depressed, Hopeless  0 0 0 1  PHQ - 2 Score  0 0 0 2  Altered sleeping    1 1  Tired, decreased energy 2   2 2  $ Change in appetite 1   1 1  $ Feeling bad or failure about yourself  0   1 1  Trouble concentrating 0   1 1  Moving slowly or fidgety/restless 0   1 0  Suicidal thoughts 0   0 0  PHQ-9 Score    7 8  Difficult doing work/chores Not difficult at all   Somewhat difficult Not difficult at all         05/03/2021    3:50 PM 10/13/2021   10:42 AM 04/13/2022   10:11 AM 04/20/2022    8:06 AM 09/22/2022    8:04 AM  Fall Risk  Falls in the past year?    1 0  Was there an injury with Fall? 0   0 0  Fall Risk Category Calculator    2 0  Fall Risk Category (Retired)    Moderate   (RETIRED) Patient Fall Risk Level Low fall risk Low fall risk High fall risk Moderate fall risk   (RETIRED) Patient Fall Risk Level - Comments   Anesthesia today. High fall risk per endo protocol.    Patient at Risk for Falls Due to No Fall Risks   History of fall(s);Impaired balance/gait;Impaired mobility No Fall Risks  Fall risk Follow up Falls evaluation completed   Falls prevention discussed Falls evaluation completed       Review of Systems  Constitutional:  Negative for chills, fatigue and fever.  HENT:  Negative for congestion, ear pain, rhinorrhea and sore throat.   Respiratory:  Negative for cough and shortness of breath.   Cardiovascular:  Negative for chest pain.  Gastrointestinal:  Negative for abdominal pain, constipation, diarrhea, nausea and vomiting.  Genitourinary:  Negative for dysuria and urgency.  Musculoskeletal:  Negative for back pain and myalgias.  Neurological:  Negative for dizziness, weakness, light-headedness and headaches.  Psychiatric/Behavioral:  Negative for dysphoric mood. The patient is not nervous/anxious.     Current Outpatient Medications on File Prior to Visit  Medication Sig Dispense Refill   cephALEXin (KEFLEX) 500 MG capsule Take 500 mg by mouth 2 (two) times daily.     spironolactone (ALDACTONE) 25 MG tablet Take 25 mg by mouth daily.     albuterol (VENTOLIN HFA) 108 (90 Base) MCG/ACT inhaler INHALE 1 TO 2  PUFFS BY MOUTH EVERY 4 HOURS AS NEEDED FOR WHEEZING 8 g 2   aspirin 81 MG chewable tablet Chew 81 mg by mouth in the morning.      Blood Glucose Monitoring Suppl (ONETOUCH VERIO FLEX SYSTEM) w/Device KIT Check sugar once daily 1 kit 0   Budeson-Glycopyrrol-Formoterol (BREZTRI AEROSPHERE) 160-9-4.8 MCG/ACT AERO Inhale 2 puffs into the lungs 2 (two) times daily. 32.1 g 3   calcium-vitamin D (OSCAL WITH D) 500-200 MG-UNIT TABS tablet Take 1 tablet by mouth daily.     carvedilol (COREG) 12.5 MG tablet Take 12.5 mg by mouth 2 (two) times daily with a meal.     Cholecalciferol (VITAMIN D-3) 1000 UNITS CAPS Take 1,000 Units by mouth daily.     colchicine 0.6 MG tablet Take 1 tablet (0.6 mg total) by mouth 2 (two) times daily. (Patient not taking: Reported on 08/16/2022) 14 tablet 0   digoxin (LANOXIN) 0.125 MG tablet Take 0.5 tablets (0.0625 mg total) by mouth daily. 30 tablet 1   empagliflozin (JARDIANCE) 10 MG TABS tablet Take 1 tablet (10 mg total) by mouth daily with  breakfast. 30 tablet 3   gabapentin (NEURONTIN) 100 MG capsule Take 1 capsule (100 mg total) by mouth at bedtime. 30 capsule 3   glucose blood (ONETOUCH VERIO) test strip USE 1 STRIP TO CHECK GLUCOSE ONCE DAILY 100 strip 12   lovastatin (MEVACOR) 40 MG tablet Take 1 tablet (40 mg total) by mouth at bedtime. 90 tablet 1   Magnesium 250 MG TABS Take 1 tablet (250 mg total) by mouth in the morning and at bedtime. 60 tablet 0   metFORMIN (GLUCOPHAGE) 500 MG tablet TAKE 1 TABLET BY MOUTH TWICE DAILY WITH MORNING MEAL AND WITH EVENING MEAL 180 tablet 0   Multiple Vitamins-Minerals (CENTRUM SILVER ULTRA WOMENS PO) Take 1 tablet by mouth daily.     nitroGLYCERIN (NITROSTAT) 0.4 MG SL tablet DISSOLVE ONE TABLET UNDER THE TONGUE EVERY 5 MINUTES AS NEEDED FOR CHEST PAIN.  DO NOT EXCEED A TOTAL OF 3 DOSES IN 15 MINUTES 25 tablet 0   OneTouch Delica Lancets 99991111 MISC USE 1  TO CHECK GLUCOSE ONCE DAILY *APPOINTMENT  REQUIRED  FOR  FUTURE  REFILLS 100 each 3   Polyethyl Glycol-Propyl Glycol (SYSTANE ULTRA) 0.4-0.3 % SOLN Place 1 drop into both eyes as needed (dry eyes).     torsemide (DEMADEX) 20 MG tablet Take 0.5 tablets (10 mg total) by mouth daily. 90 tablet 1   venlafaxine XR (EFFEXOR-XR) 75 MG 24 hr capsule Take 1 capsule (75 mg total) by mouth daily with breakfast. 90 capsule 0   No current facility-administered medications on file prior to visit.   Past Medical History:  Diagnosis Date   Anemia    Anxiety and depression    Arthritis    HANDS,ARMS,FEET   Asthma    Atrioventricular block    CAD (coronary artery disease)    CHF (congestive heart failure) (HCC)    Chronic renal insufficiency    Clotting disorder (HCC)    Complication of anesthesia    Difficulty waking up once.   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 Otherwise normal colonoscopy   ESOPHAGOGASTRODUODENOSCOPY  11/16/2005   [place]. Patchy areas of mucosal erythema in the antrum compatible with gastritis. Polyps in the stomach. Nodule in the antrum. Medium hiatal hernia   ESOPHAGOGASTRODUODENOSCOPY (EGD) WITH PROPOFOL N/A 04/13/2022   Procedure: ESOPHAGOGASTRODUODENOSCOPY (EGD) WITH PROPOFOL;  Surgeon: Jackquline Denmark, MD;  Location: [facility];  Service: Gastroenterology;  Laterality: N/A;   MALONEY DILATION  04/13/2022   Procedure: Venia Minks DILATION;  Surgeon: Jackquline Denmark, MD;  Location: [facility];  Service: Gastroenterology;;   pacemaker  09/27/2016   PACEMAKER REVISION  09/27/2020   PACEMAKER UPGRADE DUAL CHAMBER TO BIV    Family History  Problem Relation Age of Onset   High blood pressure Mother    Diabetes Mother        no medication needed   Kidney disease Mother    Pneumonia Mother    Colon polyps Father    High blood pressure Father    Colon cancer Father    Cancer Maternal Grandmother        Leukemia   Cancer Other        Ovarian   Rectal cancer Neg Hx    Stomach cancer Neg Hx    Esophageal cancer Neg  Hx    Liver cancer Neg Hx    Pancreatic cancer Neg Hx    Crohn's disease Neg Hx    Social History   Socioeconomic History   Marital status: Married    Spouse name: Darryl   Number of children: 3   Years of education: Not on file   Highest education level: 9th grade  Occupational History   Occupation: Retired  Tobacco Use   Smoking status: Never    Passive exposure: Past   Smokeless tobacco: Never  Vaping Use   Vaping Use: Never used  Substance and Sexual Activity   Alcohol use: Never   Drug use: Never   Sexual activity: Not on file  Other Topics Concern   Not on file  Social History Narrative   Lives at home with her husband   Right handed   Caffeine: mostly tea, 0-2 cups a day   Social Determinants of Health   Financial Resource Strain: Low Risk  (04/20/2022)   Overall Financial Resource Strain (CARDIA)    Difficulty of Paying Living Expenses: Not hard at all  Recent Concern: Financial Resource Strain - High Risk (04/19/2022)   Overall Financial Resource Strain (CARDIA)    Difficulty of Paying Living Expenses: Very hard  Food Insecurity: No Food Insecurity (06/08/2022)   Hunger Vital Sign    Worried About Running Out of Food in the Last Year: Never true  Ran Out of Food in the Last Year: Never true  Transportation Needs: No Transportation Needs (06/08/2022)   PRAPARE - Hydrologist (Medical): No    Lack of Transportation (Non-Medical): No  Physical Activity: Inactive (04/20/2022)   Exercise Vital Sign    Days of Exercise per Week: 0 days    Minutes of Exercise per Session: 0 min  Stress: No Stress Concern Present (04/20/2022)   [place]    Feeling of Stress : Only a little  Social Connections: Moderately Integrated (04/20/2022)   Social Connection and Isolation Panel [NHANES]    Frequency of Communication with Friends and Family: More than three times a week    Frequency  of Social Gatherings with Friends and Family: More than three times a week    Attends Religious Services: 1 to 4 times per year    Active Member of [employer] or Organizations: No    Attends Music therapist: Never    Marital Status: Married    Objective:  BP 124/74   Pulse 87   Temp (!) 97.1 F (36.2 C)   Ht 5' 1"$  (1.549 m)   Wt 92 lb (41.7 kg)   SpO2 96%   BMI 17.38 kg/m      09/22/2022    7:59 AM 08/16/2022    9:01 AM 06/15/2022    3:34 PM  BP/Weight  Systolic BP A999333 [id number]  Diastolic BP 74 58 60  Wt. (Lbs) 92 94 95.4  BMI 17.38 kg/m2 17.19 kg/m2 17.45 kg/m2    Physical Exam Vitals reviewed.  Constitutional:      Appearance: Normal appearance.     Comments: underweight  Neck:     Vascular: No carotid bruit.  Cardiovascular:     Rate and Rhythm: Normal rate and regular rhythm.     Heart sounds: Normal heart sounds.  Pulmonary:     Effort: Pulmonary effort is normal. No respiratory distress.     Breath sounds: Normal breath sounds.  Abdominal:     General: Abdomen is flat. Bowel sounds are normal.     Palpations: Abdomen is soft.     Tenderness: There is no abdominal tenderness.  Neurological:     Mental Status: She is alert and oriented to person, place, and time.  Psychiatric:        Mood and Affect: Mood normal.        Behavior: Behavior normal.     Diabetic Foot Exam - Simple   No data filed      Lab Results  Component Value Date   WBC 5.7 06/12/2022   HGB 9.9 (A) 06/12/2022   HCT 30 (A) 06/12/2022   PLT 324 06/12/2022   GLUCOSE 134 (H) 04/20/2022   CHOL 216 (H) 03/30/2022   TRIG 153 (H) 03/30/2022   HDL 76 03/30/2022   LDLDIRECT 94.2 12/05/2013   LDLCALC 114 (H) 03/30/2022   ALT 15 04/20/2022   AST 19 04/20/2022   NA 137 06/12/2022   K 4.1 06/12/2022   CL 103 06/12/2022   CREATININE 1.0 06/12/2022   BUN 35 (A) 06/12/2022   CO2 24 (A) 06/12/2022   TSH 1.220 04/20/2022   INR 1.3 (H) 11/12/2020   HGBA1C 7.4 05/26/2022    MICROALBUR 4.0 (H) 03/25/2021      Assessment & Plan:    Iron deficiency anemia, unspecified iron deficiency anemia type Assessment & Plan: Check labs.  Orders: -  CBC with Differential/Platelet -     Iron, TIBC and Ferritin Panel -     B12 and Folate Panel -     Methylmalonic acid, serum -     Ferritin -     Ambulatory referral to Hematology / Oncology  Anemia of chronic disease -     Ambulatory referral to Hematology / Oncology     No orders of the defined types were placed in this encounter.   Orders Placed This Encounter  Procedures   CBC with Differential/Platelet   Iron, TIBC and Ferritin Panel   B12 and Folate Panel   Methylmalonic acid, serum   Ferritin   Ambulatory referral to Hematology / Oncology     Follow-up: No follow-ups on file.  An After Visit Summary was printed and given to the patient.   Geralynn Ochs I Leal-Borjas,acting as a scribe for [name], MD.,have documented all relevant documentation on the behalf of [name], MD,as directed by  [name], MD while in the presence of [name], MD.    [name], MD Page [phone]

## 2022-09-22 NOTE — Telephone Encounter (Addendum)
Dr Tobie Poet called this morning and would like for you to see pt. She has multiple co morbidities, along with IDA. Dr Tobie Poet is drawing more labs today. Thought is she may need iron infusion. She has appt with you in March. Could we move it up?    Dr Bobby Rumpf is fine with moving pt's appt up. Damaris,scheduler, called and gave new appt.

## 2022-09-22 NOTE — Progress Notes (Signed)
Care Management & Coordination Services Pharmacy Team  Reason for Encounter: Diabetes  Contacted patient to discuss diabetes disease state. Spoke with family on 09/22/2022   Current antihyperglycemic regimen:  Jardiance 10 mg daily  Metformin 500 mg    Patient verbally confirms she is taking the above medications as directed. Yes  What diet changes have been made to improve diabetes control? Patient's husband states blood sugars have been doing great since using jardiance.  What recent interventions/DTPs have been made to improve glycemic control:  None  Have there been any recent hospitalizations or ED visits since last visit with PharmD? No  Patient denies hypoglycemic symptoms  Patient denies hyperglycemic symptoms  How often are you checking your blood sugar? once daily  What are your blood sugars ranging?  Fasting: 02-08 123, 02-07 113, 02-06 104, 02-05 108 Before meals: None After meals: None Bedtime: None  During the week, how often does your blood glucose drop below 70? Never  Are you checking your feet daily/regularly? Yes  Adherence Review: Is the patient currently on a STATIN medication? Yes Is the patient currently on ACE/ARB medication? No Does the patient have >5 day gap between last estimated fill dates? No  Chart Updates: Recent office visits:  09-22-2022 [name], MD. Patient reported not taking omeprazole, zofran, xarelto and entresto.  Recent consult visits:  09-20-2022 [name], NP  (Cardiology). CUS TTE SURFACE COMPLETE ECHO ADULT  completed.  09-11-2022 Emeterio Reeve (Pulmonology). Unable to view encounter.  08-24-2022 Verdon Cummins, PA-C (Cardiology). CUS TTE SURFACE COMPLETE ECHO ADULT  completed.  08-22-2022 Clydia Llano (Cardiology). Unable to view encounter.  08-16-2022 Elayne Snare, MD (Endo). START gabapentin 100 mg at bedtime.  08-01-2022 Alver Fisher, MD  (Infectious  disease). START keflex 500 mg twice daily.  08-01-2022 Clydia Llano, MD (Cardiology). CUS TTE SURFACE COMPLETE ECHO ADULT completed.  07-25-2022 Emeterio Reeve, PA-C. [place] completed.  07-25-2022 Blenda Bridegroom, MD (Cardiology). Initial consult for CHF.  07-18-2022 Dalbert Garnet, MD (Cardiology). Follow up  Hospital visits:  None in previous 6 months  Medications: Outpatient Encounter Medications as of 09/22/2022  Medication Sig Note   albuterol (VENTOLIN HFA) 108 (90 Base) MCG/ACT inhaler INHALE 1 TO 2 PUFFS BY MOUTH EVERY 4 HOURS AS NEEDED FOR WHEEZING 04/07/2022: Uses 2 times daily    aspirin 81 MG chewable tablet Chew 81 mg by mouth in the morning.     Blood Glucose Monitoring Suppl (ONETOUCH VERIO FLEX SYSTEM) w/Device KIT Check sugar once daily    Budeson-Glycopyrrol-Formoterol (BREZTRI AEROSPHERE) 160-9-4.8 MCG/ACT AERO Inhale 2 puffs into the lungs 2 (two) times daily.    calcium-vitamin D (OSCAL WITH D) 500-200 MG-UNIT TABS tablet Take 1 tablet by mouth daily.    carvedilol (COREG) 12.5 MG tablet Take 12.5 mg by mouth 2 (two) times daily with a meal.    cephALEXin (KEFLEX) 500 MG capsule Take 500 mg by mouth 2 (two) times daily.    Cholecalciferol (VITAMIN D-3) 1000 UNITS CAPS Take 1,000 Units by mouth daily.    colchicine 0.6 MG tablet Take 1 tablet (0.6 mg total) by mouth 2 (two) times daily. (Patient not taking: Reported on 08/16/2022)    digoxin (LANOXIN) 0.125 MG tablet Take 0.5 tablets (0.0625 mg total) by mouth daily.    empagliflozin (JARDIANCE) 10 MG TABS tablet Take 1 tablet (10 mg total) by mouth daily with breakfast.    gabapentin (NEURONTIN) 100 MG capsule Take 1 capsule (100  mg total) by mouth at bedtime.    glucose blood (ONETOUCH VERIO) test strip USE 1 STRIP TO CHECK GLUCOSE ONCE DAILY    lovastatin (MEVACOR) 40 MG tablet Take 1 tablet (40 mg total) by mouth at bedtime.    Magnesium 250 MG TABS Take 1 tablet (250 mg  total) by mouth in the morning and at bedtime.    metFORMIN (GLUCOPHAGE) 500 MG tablet TAKE 1 TABLET BY MOUTH TWICE DAILY WITH MORNING MEAL AND WITH EVENING MEAL    Multiple Vitamins-Minerals (CENTRUM SILVER ULTRA WOMENS PO) Take 1 tablet by mouth daily.    nitroGLYCERIN (NITROSTAT) 0.4 MG SL tablet DISSOLVE ONE TABLET UNDER THE TONGUE EVERY 5 MINUTES AS NEEDED FOR CHEST PAIN.  DO NOT EXCEED A TOTAL OF 3 DOSES IN 15 MINUTES    OneTouch Delica Lancets 99991111 MISC USE 1  TO CHECK GLUCOSE ONCE DAILY *APPOINTMENT  REQUIRED  FOR  FUTURE  REFILLS    Polyethyl Glycol-Propyl Glycol (SYSTANE ULTRA) 0.4-0.3 % SOLN Place 1 drop into both eyes as needed (dry eyes).    spironolactone (ALDACTONE) 25 MG tablet Take 25 mg by mouth daily.    torsemide (DEMADEX) 20 MG tablet Take 0.5 tablets (10 mg total) by mouth daily.    venlafaxine XR (EFFEXOR-XR) 75 MG 24 hr capsule Take 1 capsule (75 mg total) by mouth daily with breakfast.    No facility-administered encounter medications on file as of 09/22/2022.    Recent Relevant Labs: Lab Results  Component Value Date/Time   HGBA1C 7.4 05/26/2022 12:00 AM   HGBA1C 7.4 (H) 03/30/2022 08:56 AM   HGBA1C 6.6 (A) 10/26/2021 08:33 AM   HGBA1C 6.5 (A) 07/26/2021 08:30 AM   HGBA1C 6.7 (H) 10/22/2020 09:01 AM   MICROALBUR 4.0 (H) 03/25/2021 08:59 AM   MICROALBUR <0.7 03/23/2020 10:49 AM    Kidney Function Lab Results  Component Value Date/Time   CREATININE 1.0 06/12/2022 12:00 AM   CREATININE 1.20 (H) 04/20/2022 08:56 AM   CREATININE 1.23 (H) 03/30/2022 08:56 AM   GFR 32.91 (L) 03/23/2020 08:52 AM   GFRNONAA 60 09/08/2020 02:42 PM   GFRAA 69 09/08/2020 02:42 PM    Star Rating Drugs:  Metformin 500 mg- Last filled 07-03-2022 90 DS. Previous 06-01-2022 30 DS. Jardiance 10 mg- Last filled 08-28-2022 30 DS. Previous 07-24-2022 30 DS. Lovastatin 40 mg- Last filled 08-08-2022 90 DS. Previous 04-21-2022 90 DS  Care Gaps: Annual wellness visit in last year? Yes Last eye  exam / retinopathy screening: 09-2021 Last diabetic foot exam: Endocrinology checks feet Shingrix overdue   [place] Pharmacist Assistant [phone]

## 2022-09-23 LAB — LIPID PANEL
Chol/HDL Ratio: 1.9 ratio (ref 0.0–4.4)
Cholesterol, Total: 160 mg/dL (ref 100–199)
HDL: 85 mg/dL (ref >39)
LDL Chol Calc (NIH): 52 mg/dL (ref 0–99)
Triglycerides: 135 mg/dL (ref 0–149)
VLDL Cholesterol Cal: 23 mg/dL (ref 5–40)

## 2022-09-23 LAB — MAGNESIUM: Magnesium: 2.4 mg/dL — ABNORMAL HIGH (ref 1.6–2.3)

## 2022-09-23 LAB — CARDIOVASCULAR RISK ASSESSMENT

## 2022-09-24 ENCOUNTER — Other Ambulatory Visit: Payer: Self-pay | Admitting: Oncology

## 2022-09-24 DIAGNOSIS — D631 Anemia in chronic kidney disease: Secondary | ICD-10-CM

## 2022-09-24 NOTE — Progress Notes (Unsigned)
[facility]  [address] [place],  [place]  [address] [phone]  Clinic Day:  09/25/2022  Referring physician: [name], MD  HISTORY OF PRESENT ILLNESS:  The patient is a 82 y.o. female with anemia secondary to chronic renal insufficiency.  Over the past few years, her hemoglobin has been well above 10 to where minimal intervention has been necessary.  However, she comes in today due to her being more anemic, with the concern that her iron stores may be dropping.  She was recently hospitalized with both congestive heart failure and sepsis.  The patient denies having any overt forms of blood loss.  However, her husband claims she has been more fatigued.    PHYSICAL EXAM:  Blood pressure 135/67, pulse 80, temperature (!) 97.5 F (36.4 C), resp. rate 14, height 5' 1"$  (1.549 m), weight 93 lb 8 oz (42.4 kg), SpO2 94 %. Wt Readings from Last 3 Encounters:  09/25/22 93 lb 8 oz (42.4 kg)  09/22/22 92 lb (41.7 kg)  08/16/22 94 lb (42.6 kg)   Body mass index is 17.67 kg/m. Performance status (ECOG): 1 - Symptomatic but completely ambulatory Physical Exam Constitutional:      Appearance: Normal appearance. She is not ill-appearing.     Comments: She looks thinner vs previous visits  HENT:     Mouth/Throat:     Mouth: Mucous membranes are moist.     Pharynx: Oropharynx is clear. No oropharyngeal exudate or posterior oropharyngeal erythema.  Cardiovascular:     Rate and Rhythm: Normal rate and regular rhythm.     Heart sounds: No murmur heard.    No friction rub. No gallop.  Pulmonary:     Effort: Pulmonary effort is normal. No respiratory distress.     Breath sounds: Normal breath sounds. No wheezing, rhonchi or rales.  Abdominal:     General: Bowel sounds are normal. There is no distension.     Palpations: Abdomen is soft. There is no mass.     Tenderness: There is no abdominal tenderness.  Musculoskeletal:        General: No swelling.      Right lower leg: No edema.     Left lower leg: No edema.  Lymphadenopathy:     Cervical: No cervical adenopathy.     Upper Body:     Right upper body: No supraclavicular or axillary adenopathy.     Left upper body: No supraclavicular or axillary adenopathy.     Lower Body: No right inguinal adenopathy. No left inguinal adenopathy.  Skin:    General: Skin is warm.     Coloration: Skin is not jaundiced.     Findings: No rash.  Neurological:     General: No focal deficit present.     Mental Status: She is alert and oriented to person, place, and time. Mental status is at baseline.  Psychiatric:        Mood and Affect: Mood normal.        Behavior: Behavior normal.        Thought Content: Thought content normal.           LABS:     Latest Reference Range & Units 09/25/22 10:32  Iron 28 - 170 ug/dL 71  UIBC ug/dL 413  TIBC 250 - 450 ug/dL 484 (H)  Saturation Ratios 10.4 - 31.8 % 15  Ferritin 11 - 307 ng/mL 28  (H): Data is abnormally high  ASSESSMENT & PLAN:  Assessment/Plan:  A 82 y.o. female with anemia secondary to previous renal insufficiency.  Her hemoglobin is lower today than what it has been in the past.  Furthermore, her MCV has fallen from 93 to 83 in just 3 months.  Her iron parameters are suggestive of iron deficiency to where I will arrange for her to receive IV iron over these next few weeks to rapidly replenish her iron stores and improve her hemoglobin.  I will see her back in 3 months for repeat clinical assessment. The patient understands all the plans discussed today and is in agreement with them.    Demarques Pilz Macarthur Critchley, MD

## 2022-09-25 ENCOUNTER — Telehealth: Payer: Self-pay | Admitting: Oncology

## 2022-09-25 ENCOUNTER — Telehealth: Payer: Self-pay

## 2022-09-25 ENCOUNTER — Inpatient Hospital Stay: Payer: Medicare Other

## 2022-09-25 ENCOUNTER — Inpatient Hospital Stay: Payer: Medicare Other | Attending: Oncology | Admitting: Oncology

## 2022-09-25 VITALS — BP 135/67 | HR 80 | Temp 97.5°F | Resp 14 | Ht 61.0 in | Wt 93.5 lb

## 2022-09-25 DIAGNOSIS — D631 Anemia in chronic kidney disease: Secondary | ICD-10-CM | POA: Diagnosis not present

## 2022-09-25 DIAGNOSIS — D508 Other iron deficiency anemias: Secondary | ICD-10-CM | POA: Diagnosis not present

## 2022-09-25 DIAGNOSIS — N189 Chronic kidney disease, unspecified: Secondary | ICD-10-CM | POA: Diagnosis not present

## 2022-09-25 DIAGNOSIS — N1832 Chronic kidney disease, stage 3b: Secondary | ICD-10-CM | POA: Insufficient documentation

## 2022-09-25 DIAGNOSIS — D649 Anemia, unspecified: Secondary | ICD-10-CM | POA: Diagnosis not present

## 2022-09-25 DIAGNOSIS — E611 Iron deficiency: Secondary | ICD-10-CM | POA: Diagnosis not present

## 2022-09-25 LAB — IRON AND TIBC
Iron: 71 ug/dL (ref 28–170)
Saturation Ratios: 15 % (ref 10.4–31.8)
TIBC: 484 ug/dL — ABNORMAL HIGH (ref 250–450)
UIBC: 413 ug/dL

## 2022-09-25 LAB — HEPATIC FUNCTION PANEL
ALT: 17 U/L (ref 7–35)
AST: 32 (ref 13–35)
Alkaline Phosphatase: 100 (ref 25–125)
Bilirubin, Total: 0.5

## 2022-09-25 LAB — BASIC METABOLIC PANEL
BUN: 38 — AB (ref 4–21)
CO2: 24 — AB (ref 13–22)
Chloride: 101 (ref 99–108)
Creatinine: 1.2 — AB (ref 0.5–1.1)
Glucose: 90
Potassium: 4.5 mEq/L (ref 3.5–5.1)
Sodium: 133 — AB (ref 137–147)

## 2022-09-25 LAB — COMPREHENSIVE METABOLIC PANEL
Albumin: 4.1 (ref 3.5–5.0)
Calcium: 9.5 (ref 8.7–10.7)

## 2022-09-25 LAB — FERRITIN: Ferritin: 28 ng/mL (ref 11–307)

## 2022-09-25 LAB — CBC: RBC: 4.06 (ref 3.87–5.11)

## 2022-09-25 LAB — CBC AND DIFFERENTIAL
HCT: 34 — AB (ref 36–46)
Hemoglobin: 10.8 — AB (ref 12.0–16.0)
Neutrophils Absolute: 4.89
Platelets: 206 10*3/uL (ref 150–400)
WBC: 7.3

## 2022-09-25 NOTE — Telephone Encounter (Signed)
Patient has been scheduled. Aware of appt dates and times.   Scheduling Message Entered by [place], AMY W on 09/25/2022 at  3:29 PM Priority: High INFUSION 1HR30MIN (90)  Department: [facility]  Provider: Marice Potter, MD  Appointment Notes:  IV iron infusions  Scheduling Notes:

## 2022-09-25 NOTE — Telephone Encounter (Signed)
Dr Bobby Rumpf reviewed lab results & has recommended pt have IV iron infusions.   Latest Reference Range & Units 09/25/22 10:32  Iron 28 - 170 ug/dL 71  UIBC ug/dL 413  TIBC 250 - 450 ug/dL 484 (H)  Saturation Ratios 10.4 - 31.8 % 15  Ferritin 11 - 307 ng/mL 28  (H): Data is abnormally high

## 2022-09-26 ENCOUNTER — Other Ambulatory Visit: Payer: Self-pay | Admitting: Endocrinology

## 2022-09-26 LAB — CBC WITH DIFFERENTIAL/PLATELET
Basophils Absolute: 0.1 10*3/uL (ref 0.0–0.2)
Basos: 1 %
EOS (ABSOLUTE): 0.3 10*3/uL (ref 0.0–0.4)
Eos: 3 %
Hematocrit: 34.5 % (ref 34.0–46.6)
Hemoglobin: 10.8 g/dL — ABNORMAL LOW (ref 11.1–15.9)
Immature Grans (Abs): 0 10*3/uL (ref 0.0–0.1)
Immature Granulocytes: 0 %
Lymphocytes Absolute: 1.4 10*3/uL (ref 0.7–3.1)
Lymphs: 17 %
MCH: 26 pg — ABNORMAL LOW (ref 26.6–33.0)
MCHC: 31.3 g/dL — ABNORMAL LOW (ref 31.5–35.7)
MCV: 83 fL (ref 79–97)
Monocytes Absolute: 0.5 10*3/uL (ref 0.1–0.9)
Monocytes: 6 %
Neutrophils Absolute: 6 10*3/uL (ref 1.4–7.0)
Neutrophils: 73 %
Platelets: 217 10*3/uL (ref 150–450)
RBC: 4.16 x10E6/uL (ref 3.77–5.28)
RDW: 14.9 % (ref 11.7–15.4)
WBC: 8.2 10*3/uL (ref 3.4–10.8)

## 2022-09-26 LAB — IRON,TIBC AND FERRITIN PANEL
Ferritin: 58 ng/mL (ref 15–150)
Iron Saturation: 11 % — ABNORMAL LOW (ref 15–55)
Iron: 46 ug/dL (ref 27–139)
Total Iron Binding Capacity: 416 ug/dL (ref 250–450)
UIBC: 370 ug/dL — ABNORMAL HIGH (ref 118–369)

## 2022-09-26 LAB — B12 AND FOLATE PANEL
Folate: >20 ng/mL (ref >3.0)
Vitamin B-12: 565 pg/mL (ref 232–1245)

## 2022-09-26 LAB — METHYLMALONIC ACID, SERUM: Methylmalonic Acid: 299 nmol/L (ref 0–378)

## 2022-09-26 NOTE — Progress Notes (Signed)
Blood count abnormal. Hb improved. Iron is still low, but improving.   Vitamin b12 and folate normal.   Mychart message sent.  If this has not been viewed in the next 5 days, our nurses should call you to notify of your results. If you nave any questions, please call our office.  Thank you,  [name], MD [place] [phone]

## 2022-09-27 MED FILL — Ferumoxytol Inj 510 MG/17ML (30 MG/ML) (Elemental Fe): INTRAVENOUS | Qty: 17 | Status: AC

## 2022-09-28 ENCOUNTER — Inpatient Hospital Stay: Payer: Medicare Other

## 2022-09-28 VITALS — BP 119/70 | HR 84 | Temp 97.6°F | Resp 16 | Ht 61.0 in | Wt 92.0 lb

## 2022-09-28 DIAGNOSIS — D508 Other iron deficiency anemias: Secondary | ICD-10-CM

## 2022-09-28 DIAGNOSIS — D631 Anemia in chronic kidney disease: Secondary | ICD-10-CM | POA: Diagnosis not present

## 2022-09-28 DIAGNOSIS — N1832 Chronic kidney disease, stage 3b: Secondary | ICD-10-CM | POA: Diagnosis not present

## 2022-09-28 DIAGNOSIS — E611 Iron deficiency: Secondary | ICD-10-CM | POA: Diagnosis not present

## 2022-09-28 MED ORDER — SODIUM CHLORIDE 0.9 % IV SOLN
510.0000 mg | Freq: Once | INTRAVENOUS | Status: AC
  Administered 2022-09-28: 510 mg via INTRAVENOUS
  Filled 2022-09-28: qty 510

## 2022-09-28 MED ORDER — SODIUM CHLORIDE 0.9 % IV SOLN: Freq: Once | INTRAVENOUS | Status: AC

## 2022-09-28 NOTE — Patient Instructions (Signed)

## 2022-10-04 ENCOUNTER — Ambulatory Visit (INDEPENDENT_AMBULATORY_CARE_PROVIDER_SITE_OTHER): Payer: Medicare Other | Admitting: Family Medicine

## 2022-10-04 VITALS — BP 120/68 | HR 62 | Temp 96.9°F | Ht 61.0 in | Wt 93.8 lb

## 2022-10-04 DIAGNOSIS — D638 Anemia in other chronic diseases classified elsewhere: Secondary | ICD-10-CM

## 2022-10-04 DIAGNOSIS — E1121 Type 2 diabetes mellitus with diabetic nephropathy: Secondary | ICD-10-CM

## 2022-10-04 DIAGNOSIS — E782 Mixed hyperlipidemia: Secondary | ICD-10-CM | POA: Diagnosis not present

## 2022-10-04 DIAGNOSIS — N1832 Chronic kidney disease, stage 3b: Secondary | ICD-10-CM | POA: Diagnosis not present

## 2022-10-04 DIAGNOSIS — F330 Major depressive disorder, recurrent, mild: Secondary | ICD-10-CM | POA: Diagnosis not present

## 2022-10-04 DIAGNOSIS — I130 Hypertensive heart and chronic kidney disease with heart failure and stage 1 through stage 4 chronic kidney disease, or unspecified chronic kidney disease: Secondary | ICD-10-CM | POA: Diagnosis not present

## 2022-10-04 DIAGNOSIS — Z5181 Encounter for therapeutic drug level monitoring: Secondary | ICD-10-CM

## 2022-10-04 DIAGNOSIS — I5020 Unspecified systolic (congestive) heart failure: Secondary | ICD-10-CM

## 2022-10-04 DIAGNOSIS — D509 Iron deficiency anemia, unspecified: Secondary | ICD-10-CM | POA: Diagnosis not present

## 2022-10-04 DIAGNOSIS — G720 Drug-induced myopathy: Secondary | ICD-10-CM

## 2022-10-04 MED FILL — Ferumoxytol Inj 510 MG/17ML (30 MG/ML) (Elemental Fe): INTRAVENOUS | Qty: 17 | Status: AC

## 2022-10-04 NOTE — Assessment & Plan Note (Signed)
Stable. 

## 2022-10-04 NOTE — Assessment & Plan Note (Signed)
The current medical regimen is effective;  continue present plan and medications. Venlafaxine XR 75 mg take 1 capsule daily.

## 2022-10-04 NOTE — Assessment & Plan Note (Signed)
Control: Fair Recommend check sugars fasting daily. Recommend check feet daily. Recommend annual eye exams. Medicines: continue Metformin 500 mg take 1 tablet twice daily. Jardiance 10 mg daily Continue to work on eating a healthy diet and exercise.

## 2022-10-04 NOTE — Progress Notes (Signed)
Subjective:  Patient ID: Bridget [name], female    DOB: November 26, 1940  Age: 82 y.o. MRN: [id number]  Chief Complaint  Patient presents with   Diabetes   Depression    HPI Diabetes:  Complications: CKD Most recent A1C: 7.4 Checking sugars daily. 90-120. Current medications: Metformin 500 mg take 1 tablet twice daily. Jardiance 10 mg daily, and gabapentin 100 mg before bed.  Last Eye Exam:01/12/2021. Foot checks: daily Dr. Dwyane Dee endocrinologist.   Hyperlipidemia: Current medications:Lovastatin 40 mg take 1 tablet daily  Hypertension with chf: Current medications: torsemide 20 mg 1/2 daily, entresto 97/103 twice daily, digoxin 0.125 mg 1/2 every other day, carvedilol 12.5 mg 1/4 twice daily, baby aspirin 81 mg daily,  spironolactone 25 mg daily. Ntg 0.4 mg SL every 5 minutes as needed chest pain. Patient sees Dr. Otho Perl. Dr. Otho Perl discontinue xarelto in 06/2022.  Hypomagnesemia: Mag 250 mg twice daily.   Depression: Patient is currently taking Venlafaxine XR 75 mg take 1 capsule daily. Doing well .  GERD: on omeprazole 20 mg daily. Having dysphagia. Scheduled for EGD later this month with Dr. Lyndel Safe.   History of DVT: xarelto 15 mg once daily.   Mild Persistent Asthma: Patient is currently taking Breztri 2 puffs twice daily  Diet: good Exercise: minimal   ON keflex 500 mg twice daily. Scheduled to return to see infectious disease.   Iron deficiency anemia: had iron infusion last week and is going for a second infusion this week.      10/04/2022    7:56 AM 09/22/2022    8:23 AM 09/22/2022    8:22 AM 09/22/2022    8:04 AM 04/20/2022    8:22 AM  Depression screen PHQ 2/9  Decreased Interest 0  0 0 0  Down, Depressed, Hopeless 1  0 0 0  PHQ - 2 Score 1  0 0 0  Altered sleeping 1    1  Tired, decreased energy '3 2   2  '$ Change in appetite '1 1   1  '$ Feeling bad or failure about yourself  1 0   1  Trouble concentrating 2 0   1  Moving slowly or fidgety/restless 0 0   1  Suicidal  thoughts 0 0   0  PHQ-9 Score 9    7  Difficult doing work/chores Not difficult at all Not difficult at all   Somewhat difficult         05/03/2021    3:50 PM 10/13/2021   10:42 AM 04/13/2022   10:11 AM 04/20/2022    8:06 AM 09/22/2022    8:04 AM  Fall Risk  Falls in the past year?    1 0  Was there an injury with Fall? 0   0 0  Fall Risk Category Calculator    2 0  Fall Risk Category (Retired)    Moderate   (RETIRED) Patient Fall Risk Level Low fall risk Low fall risk High fall risk Moderate fall risk   (RETIRED) Patient Fall Risk Level - Comments   Anesthesia today. High fall risk per endo protocol.    Patient at Risk for Falls Due to No Fall Risks   History of fall(s);Impaired balance/gait;Impaired mobility No Fall Risks  Fall risk Follow up Falls evaluation completed   Falls prevention discussed Falls evaluation completed      Review of Systems  Constitutional:  Positive for chills and fatigue (improved.). Negative for fever.  HENT:  Positive for congestion (once in a while.).  Negative for ear pain and sore throat.   Respiratory:  Positive for cough (some at night. sometimes if swallows wrong.). Negative for shortness of breath.   Cardiovascular:  Positive for chest pain (Monday. mild. left sided. no radiation. lasted 5 minutes and was at rest.).  Gastrointestinal:  Negative for abdominal pain, constipation, diarrhea, nausea and vomiting.  Genitourinary:  Negative for dysuria, frequency and urgency.  Musculoskeletal:  Positive for arthralgias (left shoulder pain.). Negative for back pain and myalgias.  Neurological:  Positive for dizziness (occasionaly when gets up too quickly.). Negative for headaches.  Psychiatric/Behavioral:  Negative for agitation and sleep disturbance. The patient is not nervous/anxious.     Current Outpatient Medications on File Prior to Visit  Medication Sig Dispense Refill   albuterol (VENTOLIN HFA) 108 (90 Base) MCG/ACT inhaler INHALE 1 TO 2 PUFFS BY MOUTH  EVERY 4 HOURS AS NEEDED FOR WHEEZING 8 g 2   aspirin 81 MG chewable tablet Chew 81 mg by mouth in the morning.      Blood Glucose Monitoring Suppl (ONETOUCH VERIO FLEX SYSTEM) w/Device KIT Check sugar once daily 1 kit 0   Budeson-Glycopyrrol-Formoterol (BREZTRI AEROSPHERE) 160-9-4.8 MCG/ACT AERO Inhale 2 puffs into the lungs 2 (two) times daily. 32.1 g 3   calcium-vitamin D (OSCAL WITH D) 500-200 MG-UNIT TABS tablet Take 1 tablet by mouth daily.     carvedilol (COREG) 12.5 MG tablet Take 12.5 mg by mouth 2 (two) times daily with a meal.     cephALEXin (KEFLEX) 500 MG capsule Take 500 mg by mouth 2 (two) times daily.     Cholecalciferol (VITAMIN D-3) 1000 UNITS CAPS Take 1,000 Units by mouth daily.     digoxin (LANOXIN) 0.125 MG tablet Take 0.5 tablets (0.0625 mg total) by mouth daily. 30 tablet 1   empagliflozin (JARDIANCE) 10 MG TABS tablet Take 1 tablet by mouth once daily with breakfast 30 tablet 3   gabapentin (NEURONTIN) 100 MG capsule Take 1 capsule (100 mg total) by mouth at bedtime. 30 capsule 3   glucose blood (ONETOUCH VERIO) test strip USE 1 STRIP TO CHECK GLUCOSE ONCE DAILY 100 strip 12   lovastatin (MEVACOR) 40 MG tablet Take 1 tablet (40 mg total) by mouth at bedtime. 90 tablet 1   Magnesium 250 MG TABS Take 1 tablet (250 mg total) by mouth in the morning and at bedtime. 60 tablet 0   Multiple Vitamins-Minerals (CENTRUM SILVER ULTRA WOMENS PO) Take 1 tablet by mouth daily.     nitroGLYCERIN (NITROSTAT) 0.4 MG SL tablet DISSOLVE ONE TABLET UNDER THE TONGUE EVERY 5 MINUTES AS NEEDED FOR CHEST PAIN.  DO NOT EXCEED A TOTAL OF 3 DOSES IN 15 MINUTES 25 tablet 0   OneTouch Delica Lancets 99991111 MISC USE 1  TO CHECK GLUCOSE ONCE DAILY *APPOINTMENT  REQUIRED  FOR  FUTURE  REFILLS 100 each 3   Polyethyl Glycol-Propyl Glycol (SYSTANE ULTRA) 0.4-0.3 % SOLN Place 1 drop into both eyes as needed (dry eyes).     sacubitril-valsartan (ENTRESTO) 97-103 MG Take 1 tablet by mouth 2 (two) times daily.      spironolactone (ALDACTONE) 25 MG tablet Take 25 mg by mouth daily.     torsemide (DEMADEX) 20 MG tablet Take 0.5 tablets (10 mg total) by mouth daily. 90 tablet 1   venlafaxine XR (EFFEXOR-XR) 75 MG 24 hr capsule Take 1 capsule (75 mg total) by mouth daily with breakfast. 90 capsule 0   No current facility-administered medications on file prior to visit.  Past Medical History:  Diagnosis Date   Anemia    Anxiety and depression    Arthritis    HANDS,ARMS,FEET   Asthma    Atrioventricular block    CAD (coronary artery disease)    CHF (congestive heart failure) (HCC)    Chronic renal insufficiency    Clotting disorder (HCC)    Complication of anesthesia    Difficulty waking up once.   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 Otherwise normal colonoscopy   ESOPHAGOGASTRODUODENOSCOPY  11/16/2005   [place]. Patchy areas of mucosal erythema in the antrum compatible with gastritis. Polyps in the  stomach. Nodule in the antrum. Medium hiatal hernia   ESOPHAGOGASTRODUODENOSCOPY (EGD) WITH PROPOFOL N/A 04/13/2022   Procedure: ESOPHAGOGASTRODUODENOSCOPY (EGD) WITH PROPOFOL;  Surgeon: Jackquline Denmark, MD;  Location: [facility];  Service: Gastroenterology;  Laterality: N/A;   MALONEY DILATION  04/13/2022   Procedure: Venia Minks DILATION;  Surgeon: Jackquline Denmark, MD;  Location: [facility];  Service: Gastroenterology;;   pacemaker  09/27/2016   PACEMAKER REVISION  09/27/2020   PACEMAKER UPGRADE DUAL CHAMBER TO BIV    Family History  Problem Relation Age of Onset   High blood pressure Mother    Diabetes Mother        no medication needed   Kidney disease Mother    Pneumonia Mother    Colon polyps Father    High blood pressure Father    Colon cancer Father    Cancer Maternal Grandmother        Leukemia   Cancer Other        Ovarian   Rectal cancer Neg Hx    Stomach cancer Neg Hx    Esophageal cancer Neg Hx    Liver cancer Neg Hx    Pancreatic cancer Neg Hx    Crohn's disease Neg Hx    Social History   Socioeconomic History   Marital status: Married    Spouse name: Darryl   Number of children: 3   Years of education: Not on file   Highest education level: 9th grade  Occupational History   Occupation: Retired  Tobacco Use   Smoking status: Never    Passive exposure: Past   Smokeless tobacco: Never  Vaping Use   Vaping Use: Never used  Substance and Sexual Activity  Alcohol use: Never   Drug use: Never   Sexual activity: Not on file  Other Topics Concern   Not on file  Social History Narrative   Lives at home with her husband   Right handed   Caffeine: mostly tea, 0-2 cups a day   Social Determinants of Health   Financial Resource Strain: Low Risk  (04/20/2022)   Overall Financial Resource Strain (CARDIA)    Difficulty of Paying Living Expenses: Not hard at all  Recent Concern: Financial Resource Strain - High Risk (04/19/2022)   Overall Financial Resource Strain  (CARDIA)    Difficulty of Paying Living Expenses: Very hard  Food Insecurity: No Food Insecurity (06/08/2022)   Hunger Vital Sign    Worried About Running Out of Food in the Last Year: Never true    Ran Out of Food in the Last Year: Never true  Transportation Needs: No Transportation Needs (06/08/2022)   PRAPARE - Hydrologist (Medical): No    Lack of Transportation (Non-Medical): No  Physical Activity: Inactive (04/20/2022)   Exercise Vital Sign    Days of Exercise per Week: 0 days    Minutes of Exercise per Session: 0 min  Stress: No Stress Concern Present (04/20/2022)   [place]    Feeling of Stress : Only a little  Social Connections: Moderately Integrated (04/20/2022)   Social Connection and Isolation Panel [NHANES]    Frequency of Communication with Friends and Family: More than three times a week    Frequency of Social Gatherings with Friends and Family: More than three times a week    Attends Religious Services: 1 to 4 times per year    Active Member of [employer] or Organizations: No    Attends Music therapist: Never    Marital Status: Married    Objective:  BP 120/68 (BP Location: Right Arm, Patient Position: Sitting, Cuff Size: Small)   Pulse 62   Temp (!) 96.9 F (36.1 C) (Temporal)   Ht '5\' 1"'$  (1.549 m)   Wt 93 lb 12.8 oz (42.5 kg)   SpO2 99%   BMI 17.72 kg/m      10/05/2022    9:51 AM 10/05/2022    8:54 AM 10/04/2022    7:54 AM  BP/Weight  Systolic BP 123456 [id number]  Diastolic BP 63 64 68  Wt. (Lbs)  93.08 93.8  BMI  17.59 kg/m2 17.72 kg/m2    Physical Exam Vitals reviewed.  Constitutional:      Appearance: Normal appearance.  Cardiovascular:     Rate and Rhythm: Normal rate and regular rhythm.     Heart sounds: Normal heart sounds.  Pulmonary:     Effort: Pulmonary effort is normal.     Breath sounds: Normal breath sounds.  Abdominal:     General:  Bowel sounds are normal.     Palpations: Abdomen is soft.     Tenderness: There is no abdominal tenderness.  Neurological:     Mental Status: She is alert and oriented to person, place, and time.  Psychiatric:        Mood and Affect: Mood normal.        Behavior: Behavior normal.     Diabetic Foot Exam - Simple   Simple Foot Form Diabetic Foot exam was performed with the following findings: Yes 10/04/2022  8:47 AM  Visual Inspection No deformities, no ulcerations, no other skin breakdown bilaterally: Yes Sensation  Testing Intact to touch and monofilament testing bilaterally: Yes Pulse Check Posterior Tibialis and Dorsalis pulse intact bilaterally: Yes Comments      Lab Results  Component Value Date   WBC 7.3 09/25/2022   HGB 10.8 (A) 09/25/2022   HCT 34 (A) 09/25/2022   PLT 206 09/25/2022   GLUCOSE 124 (H) 10/04/2022   CHOL 150 10/04/2022   TRIG 119 10/04/2022   HDL 85 10/04/2022   LDLDIRECT 94.2 12/05/2013   LDLCALC 45 10/04/2022   ALT 20 10/04/2022   AST 28 10/04/2022   NA 138 10/04/2022   K 5.9 (H) 10/04/2022   CL 100 10/04/2022   CREATININE 1.58 (H) 10/04/2022   BUN 47 (H) 10/04/2022   CO2 20 10/04/2022   TSH 1.220 04/20/2022   INR 1.3 (H) 11/12/2020   HGBA1C 7.4 05/26/2022   MICROALBUR 4.0 (H) 03/25/2021      Assessment & Plan:    Mixed hyperlipidemia Assessment & Plan: Well controlled.  No changes to medicines. Continue lovastatin 40 mg before bed.  Continue to work on eating a healthy diet and exercise.  Labs drawn today.    Orders: -     Comprehensive metabolic panel -     Lipid panel  Hypomagnesemia Assessment & Plan: Continue magnesium 250 mg daily.    Anemia of chronic disease  Iron deficiency anemia, unspecified iron deficiency anemia type Assessment & Plan: Management per specialist. Continue with Iron infusion.   Stage 3b chronic kidney disease ([place]) Assessment & Plan: Stable.  Orders: -     Digoxin level  Heart  failure with reduced ejection fraction, NYHA class III (HCC) Assessment & Plan: Management per specialist. Continue entresto, carvedilol, and torsemide.    Diabetic glomerulopathy [facility]) Assessment & Plan: Control: Fair Recommend check sugars fasting daily. Recommend check feet daily. Recommend annual eye exams. Medicines: continue Metformin 500 mg take 1 tablet twice daily. Jardiance 10 mg daily Continue to work on eating a healthy diet and exercise.       Heart and renal disease, hypertensive, stage 1-4 or unspecified chronic kidney disease, with heart failure [facility]) Assessment & Plan: Management per specialist.     Major depressive disorder, recurrent episode, mild ([place]) Assessment & Plan: The current medical regimen is effective;  continue present plan and medications. Venlafaxine XR 75 mg take 1 capsule daily.   Other orders -     Cardiovascular Risk Assessment    Orders Placed This Encounter  Procedures   Comprehensive metabolic panel   Lipid panel   Digoxin level   Cardiovascular Risk Assessment     Follow-up: Return in about 3 months (around 01/02/2023) for chronic fasting.  Total time spent on today's visit was greater than 40 minutes, including both face-to-face time and nonface-to-face time personally spent on review of chart (labs and imaging), discussing labs and goals, discussing further work-up, treatment options, referrals to specialist if needed, reviewing outside records of pertinent, answering patient's questions, and coordinating care.   I,Marla I Leal-Borjas,acting as a scribe for [name], MD.,have documented all relevant documentation on the behalf of [name], MD,as directed by  [name], MD while in the presence of [name], MD.    An After Visit Summary was printed and given to the patient.  I attest that I have reviewed this visit and agree with the plan scribed by my staff.   [name], MD [facility] [phone]

## 2022-10-04 NOTE — Assessment & Plan Note (Addendum)
Management per specialist. Continue entresto, carvedilol, and torsemide.

## 2022-10-04 NOTE — Assessment & Plan Note (Signed)
Management per specialist. 

## 2022-10-04 NOTE — Assessment & Plan Note (Signed)
Well controlled.  No changes to medicines. Continue lovastatin 40 mg before bed.  Continue to work on eating a healthy diet and exercise.  Labs drawn today.

## 2022-10-04 NOTE — Assessment & Plan Note (Signed)
Management per specialist. Continue with Iron infusion.

## 2022-10-05 ENCOUNTER — Inpatient Hospital Stay: Payer: Medicare Other

## 2022-10-05 VITALS — BP 119/63 | HR 74 | Temp 97.9°F | Ht 61.0 in | Wt 93.1 lb

## 2022-10-05 DIAGNOSIS — E611 Iron deficiency: Secondary | ICD-10-CM | POA: Diagnosis not present

## 2022-10-05 DIAGNOSIS — D631 Anemia in chronic kidney disease: Secondary | ICD-10-CM | POA: Diagnosis not present

## 2022-10-05 DIAGNOSIS — I10 Essential (primary) hypertension: Secondary | ICD-10-CM | POA: Diagnosis not present

## 2022-10-05 DIAGNOSIS — N1832 Chronic kidney disease, stage 3b: Secondary | ICD-10-CM | POA: Diagnosis not present

## 2022-10-05 DIAGNOSIS — D508 Other iron deficiency anemias: Secondary | ICD-10-CM

## 2022-10-05 DIAGNOSIS — Z45018 Encounter for adjustment and management of other part of cardiac pacemaker: Secondary | ICD-10-CM | POA: Diagnosis not present

## 2022-10-05 LAB — LIPID PANEL
Chol/HDL Ratio: 1.8 ratio (ref 0.0–4.4)
Cholesterol, Total: 150 mg/dL (ref 100–199)
HDL: 85 mg/dL (ref >39)
LDL Chol Calc (NIH): 45 mg/dL (ref 0–99)
Triglycerides: 119 mg/dL (ref 0–149)
VLDL Cholesterol Cal: 20 mg/dL (ref 5–40)

## 2022-10-05 LAB — COMPREHENSIVE METABOLIC PANEL
ALT: 20 IU/L (ref 0–32)
AST: 28 IU/L (ref 0–40)
Albumin/Globulin Ratio: 1.6 (ref 1.2–2.2)
Albumin: 4.1 g/dL (ref 3.8–4.8)
Alkaline Phosphatase: 140 IU/L — ABNORMAL HIGH (ref 44–121)
BUN/Creatinine Ratio: 30 — ABNORMAL HIGH (ref 12–28)
BUN: 47 mg/dL — ABNORMAL HIGH (ref 8–27)
Bilirubin Total: 0.4 mg/dL (ref 0.0–1.2)
CO2: 20 mmol/L (ref 20–29)
Calcium: 9.7 mg/dL (ref 8.7–10.3)
Chloride: 100 mmol/L (ref 96–106)
Creatinine, Ser: 1.58 mg/dL — ABNORMAL HIGH (ref 0.57–1.00)
Globulin, Total: 2.5 g/dL (ref 1.5–4.5)
Glucose: 124 mg/dL — ABNORMAL HIGH (ref 70–99)
Potassium: 5.9 mmol/L — ABNORMAL HIGH (ref 3.5–5.2)
Sodium: 138 mmol/L (ref 134–144)
Total Protein: 6.6 g/dL (ref 6.0–8.5)
eGFR: 33 mL/min/{1.73_m2} — ABNORMAL LOW (ref >59)

## 2022-10-05 LAB — CARDIOVASCULAR RISK ASSESSMENT

## 2022-10-05 LAB — DIGOXIN LEVEL: Digoxin, Serum: <0.4 ng/mL — ABNORMAL LOW (ref 0.5–0.9)

## 2022-10-05 MED ORDER — SODIUM CHLORIDE 0.9 % IV SOLN
510.0000 mg | Freq: Once | INTRAVENOUS | Status: AC
  Administered 2022-10-05: 510 mg via INTRAVENOUS
  Filled 2022-10-05: qty 510

## 2022-10-05 MED ORDER — SODIUM CHLORIDE 0.9 % IV SOLN: Freq: Once | INTRAVENOUS | Status: AC

## 2022-10-05 NOTE — Patient Instructions (Signed)

## 2022-10-06 ENCOUNTER — Other Ambulatory Visit: Payer: Self-pay | Admitting: Endocrinology

## 2022-10-06 DIAGNOSIS — E119 Type 2 diabetes mellitus without complications: Secondary | ICD-10-CM

## 2022-10-08 ENCOUNTER — Encounter: Payer: Self-pay | Admitting: Family Medicine

## 2022-10-08 DIAGNOSIS — D638 Anemia in other chronic diseases classified elsewhere: Secondary | ICD-10-CM | POA: Insufficient documentation

## 2022-10-08 NOTE — Assessment & Plan Note (Signed)
Continue magnesium 250 mg daily.

## 2022-10-09 ENCOUNTER — Encounter: Payer: Self-pay | Admitting: Oncology

## 2022-10-09 ENCOUNTER — Other Ambulatory Visit: Payer: Self-pay

## 2022-10-09 ENCOUNTER — Other Ambulatory Visit: Payer: Medicare Other

## 2022-10-09 ENCOUNTER — Other Ambulatory Visit: Payer: Self-pay | Admitting: Family Medicine

## 2022-10-09 DIAGNOSIS — R944 Abnormal results of kidney function studies: Secondary | ICD-10-CM

## 2022-10-09 DIAGNOSIS — E875 Hyperkalemia: Secondary | ICD-10-CM | POA: Diagnosis not present

## 2022-10-09 DIAGNOSIS — D509 Iron deficiency anemia, unspecified: Secondary | ICD-10-CM | POA: Diagnosis not present

## 2022-10-09 DIAGNOSIS — D638 Anemia in other chronic diseases classified elsewhere: Secondary | ICD-10-CM

## 2022-10-09 DIAGNOSIS — D539 Nutritional anemia, unspecified: Secondary | ICD-10-CM | POA: Diagnosis not present

## 2022-10-09 LAB — COMPREHENSIVE METABOLIC PANEL
ALT: 18 IU/L (ref 0–32)
AST: 27 IU/L (ref 0–40)
Albumin/Globulin Ratio: 2.1 (ref 1.2–2.2)
Albumin: 4.2 g/dL (ref 3.8–4.8)
Alkaline Phosphatase: 131 IU/L — ABNORMAL HIGH (ref 44–121)
BUN/Creatinine Ratio: 26 (ref 12–28)
BUN: 44 mg/dL — ABNORMAL HIGH (ref 8–27)
Bilirubin Total: 0.3 mg/dL (ref 0.0–1.2)
CO2: 26 mmol/L (ref 20–29)
Calcium: 10.4 mg/dL — ABNORMAL HIGH (ref 8.7–10.3)
Chloride: 99 mmol/L (ref 96–106)
Creatinine, Ser: 1.67 mg/dL — ABNORMAL HIGH (ref 0.57–1.00)
Globulin, Total: 2 g/dL (ref 1.5–4.5)
Glucose: 76 mg/dL (ref 70–99)
Potassium: 5.8 mmol/L — ABNORMAL HIGH (ref 3.5–5.2)
Sodium: 136 mmol/L (ref 134–144)
Total Protein: 6.2 g/dL (ref 6.0–8.5)
eGFR: 31 mL/min/{1.73_m2} — ABNORMAL LOW (ref >59)

## 2022-10-10 ENCOUNTER — Other Ambulatory Visit: Payer: Medicare Other

## 2022-10-10 ENCOUNTER — Other Ambulatory Visit: Payer: Self-pay | Admitting: Family Medicine

## 2022-10-10 ENCOUNTER — Other Ambulatory Visit: Payer: Self-pay

## 2022-10-10 DIAGNOSIS — E875 Hyperkalemia: Secondary | ICD-10-CM

## 2022-10-10 LAB — CBC WITH DIFFERENTIAL/PLATELET
Basophils Absolute: 0.1 10*3/uL (ref 0.0–0.2)
Basos: 1 %
EOS (ABSOLUTE): 0.4 10*3/uL (ref 0.0–0.4)
Eos: 6 %
Hematocrit: 35.4 % (ref 34.0–46.6)
Hemoglobin: 11.1 g/dL (ref 11.1–15.9)
Immature Grans (Abs): 0 10*3/uL (ref 0.0–0.1)
Immature Granulocytes: 1 %
Lymphocytes Absolute: 1.7 10*3/uL (ref 0.7–3.1)
Lymphs: 26 %
MCH: 26.8 pg (ref 26.6–33.0)
MCHC: 31.4 g/dL — ABNORMAL LOW (ref 31.5–35.7)
MCV: 86 fL (ref 79–97)
Monocytes Absolute: 0.6 10*3/uL (ref 0.1–0.9)
Monocytes: 9 %
Neutrophils Absolute: 3.7 10*3/uL (ref 1.4–7.0)
Neutrophils: 57 %
Platelets: 215 10*3/uL (ref 150–450)
RBC: 4.14 x10E6/uL (ref 3.77–5.28)
RDW: 16.9 % — ABNORMAL HIGH (ref 11.7–15.4)
WBC: 6.5 10*3/uL (ref 3.4–10.8)

## 2022-10-10 LAB — IRON,TIBC AND FERRITIN PANEL
Ferritin: 1648 ng/mL — ABNORMAL HIGH (ref 15–150)
Iron Saturation: 60 % — ABNORMAL HIGH (ref 15–55)
Iron: 261 ug/dL (ref 27–139)
Total Iron Binding Capacity: 433 ug/dL (ref 250–450)
UIBC: 172 ug/dL (ref 118–369)

## 2022-10-10 MED ORDER — SACUBITRIL-VALSARTAN 97-103 MG PO TABS: 1.0000 | ORAL_TABLET | Freq: Two times a day (BID) | ORAL | 0 refills | Status: DC

## 2022-10-13 ENCOUNTER — Other Ambulatory Visit: Payer: Self-pay

## 2022-10-13 ENCOUNTER — Ambulatory Visit: Payer: Medicare Other | Admitting: Oncology

## 2022-10-13 ENCOUNTER — Other Ambulatory Visit: Payer: Medicare Other

## 2022-10-13 DIAGNOSIS — E875 Hyperkalemia: Secondary | ICD-10-CM | POA: Diagnosis not present

## 2022-10-13 LAB — COMPREHENSIVE METABOLIC PANEL
ALT: 26 IU/L (ref 0–32)
AST: 34 IU/L (ref 0–40)
Albumin/Globulin Ratio: 2.3 — ABNORMAL HIGH (ref 1.2–2.2)
Albumin: 4.1 g/dL (ref 3.8–4.8)
Alkaline Phosphatase: 134 IU/L — ABNORMAL HIGH (ref 44–121)
BUN/Creatinine Ratio: 25 (ref 12–28)
BUN: 35 mg/dL — ABNORMAL HIGH (ref 8–27)
Bilirubin Total: 0.3 mg/dL (ref 0.0–1.2)
CO2: 25 mmol/L (ref 20–29)
Calcium: 10.1 mg/dL (ref 8.7–10.3)
Chloride: 102 mmol/L (ref 96–106)
Creatinine, Ser: 1.39 mg/dL — ABNORMAL HIGH (ref 0.57–1.00)
Globulin, Total: 1.8 g/dL (ref 1.5–4.5)
Glucose: 121 mg/dL — ABNORMAL HIGH (ref 70–99)
Potassium: 5.8 mmol/L — ABNORMAL HIGH (ref 3.5–5.2)
Sodium: 137 mmol/L (ref 134–144)
Total Protein: 5.9 g/dL — ABNORMAL LOW (ref 6.0–8.5)
eGFR: 39 mL/min/{1.73_m2} — ABNORMAL LOW (ref >59)

## 2022-10-15 ENCOUNTER — Other Ambulatory Visit: Payer: Self-pay | Admitting: Family Medicine

## 2022-10-16 ENCOUNTER — Other Ambulatory Visit: Payer: Self-pay

## 2022-10-16 DIAGNOSIS — E875 Hyperkalemia: Secondary | ICD-10-CM

## 2022-10-16 MED ORDER — ENTRESTO 49-51 MG PO TABS: 1.0000 | ORAL_TABLET | Freq: Two times a day (BID) | ORAL | 2 refills | Status: DC

## 2022-10-20 DIAGNOSIS — I5020 Unspecified systolic (congestive) heart failure: Secondary | ICD-10-CM | POA: Diagnosis not present

## 2022-10-24 DIAGNOSIS — Z792 Long term (current) use of antibiotics: Secondary | ICD-10-CM | POA: Diagnosis not present

## 2022-10-24 DIAGNOSIS — Z950 Presence of cardiac pacemaker: Secondary | ICD-10-CM | POA: Diagnosis not present

## 2022-10-24 DIAGNOSIS — B9561 Methicillin susceptible Staphylococcus aureus infection as the cause of diseases classified elsewhere: Secondary | ICD-10-CM | POA: Diagnosis not present

## 2022-10-24 DIAGNOSIS — R7881 Bacteremia: Secondary | ICD-10-CM | POA: Diagnosis not present

## 2022-11-01 ENCOUNTER — Ambulatory Visit (INDEPENDENT_AMBULATORY_CARE_PROVIDER_SITE_OTHER): Payer: Medicare Other | Admitting: Family Medicine

## 2022-11-01 VITALS — BP 106/58 | HR 72 | Temp 96.5°F | Resp 14 | Ht 61.0 in | Wt 95.6 lb

## 2022-11-01 DIAGNOSIS — M10372 Gout due to renal impairment, left ankle and foot: Secondary | ICD-10-CM | POA: Diagnosis not present

## 2022-11-01 MED ORDER — TRIAMCINOLONE ACETONIDE 40 MG/ML IJ SUSP
80.0000 mg | Freq: Once | INTRAMUSCULAR | Status: AC
  Administered 2022-11-01: 80 mg via INTRAMUSCULAR

## 2022-11-01 MED ORDER — COLCHICINE 0.6 MG PO TABS: 0.6000 mg | ORAL_TABLET | Freq: Two times a day (BID) | ORAL | 0 refills | Status: DC

## 2022-11-01 NOTE — Progress Notes (Unsigned)
Acute Office Visit  Subjective:    Patient ID: Bridget [name], female    DOB: [birth year], 82 y.o.   MRN: [id number]  No chief complaint on file.   HPI: Patient is in today for left foot pain which started yesterday.  It was better in the evening yesterday but symptoms are worse this morning.  Left foot is red and swollen this morning.    Past Medical History:  Diagnosis Date   Anemia    Anxiety and depression    Arthritis    HANDS,ARMS,FEET   Asthma    Atrioventricular block    CAD (coronary artery disease)    CHF (congestive heart failure) (HCC)    Chronic renal insufficiency    Clotting disorder (HCC)    Complication of anesthesia    Difficulty waking up once.   COPD (chronic obstructive pulmonary disease) (HCC)    Diabetes mellitus without complication (HCC)    Family history of colon cancer    Fibromyalgia    GERD (gastroesophageal reflux disease)    Gout    Heart disease    Heart murmur    History of colon polyps    Hypertension    Insomnia    Kidney disease    Kidney stones 01/12/1983   Major depression    Major depressive disorder, recurrent, mild (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 Otherwise normal colonoscopy   ESOPHAGOGASTRODUODENOSCOPY  11/16/2005   [place]. Patchy areas of mucosal erythema in the antrum compatible with gastritis. Polyps in the  stomach. Nodule in the antrum. Medium hiatal hernia   ESOPHAGOGASTRODUODENOSCOPY (EGD) WITH PROPOFOL N/A 04/13/2022   Procedure: ESOPHAGOGASTRODUODENOSCOPY (EGD) WITH PROPOFOL;  Surgeon: Jackquline Denmark, MD;  Location: [facility];  Service: Gastroenterology;  Laterality: N/A;   MALONEY DILATION  04/13/2022   Procedure: Venia Minks DILATION;  Surgeon: Jackquline Denmark, MD;  Location: [facility];  Service: Gastroenterology;;   pacemaker  09/27/2016   PACEMAKER REVISION  09/27/2020   PACEMAKER UPGRADE DUAL CHAMBER TO BIV    Family History  Problem Relation Age of Onset   High blood pressure Mother    Diabetes Mother        no medication needed   Kidney disease Mother    Pneumonia Mother    Colon polyps Father    High blood pressure Father    Colon cancer Father    Cancer Maternal Grandmother        Leukemia   Cancer Other        Ovarian   Rectal cancer Neg Hx    Stomach cancer Neg Hx    Esophageal cancer Neg Hx    Liver cancer Neg Hx    Pancreatic cancer Neg Hx    Crohn's disease Neg Hx     Social History   Socioeconomic History  Marital status: Married    Spouse name: Darryl   Number of children: 3   Years of education: Not on file   Highest education level: 9th grade  Occupational History   Occupation: Retired  Tobacco Use   Smoking status: Never    Passive exposure: Past   Smokeless tobacco: Never  Vaping Use   Vaping Use: Never used  Substance and Sexual Activity   Alcohol use: Never   Drug use: Never   Sexual activity: Not on file  Other Topics Concern   Not on file  Social History Narrative   Lives at home with her husband   Right handed   Caffeine: mostly tea, 0-2 cups a day   Social Determinants of Health   Financial Resource Strain: Low Risk  (04/20/2022)   Overall Financial Resource Strain (CARDIA)    Difficulty of Paying Living Expenses: Not hard at all  Recent Concern: Financial Resource Strain - High Risk (04/19/2022)   Overall Financial Resource  Strain (CARDIA)    Difficulty of Paying Living Expenses: Very hard  Food Insecurity: No Food Insecurity (06/08/2022)   Hunger Vital Sign    Worried About Running Out of Food in the Last Year: Never true    [place] in the Last Year: Never true  Transportation Needs: No Transportation Needs (06/08/2022)   PRAPARE - Hydrologist (Medical): No    Lack of Transportation (Non-Medical): No  Physical Activity: Inactive (04/20/2022)   Exercise Vital Sign    Days of Exercise per Week: 0 days    Minutes of Exercise per Session: 0 min  Stress: No Stress Concern Present (04/20/2022)   [place]    Feeling of Stress : Only a little  Social Connections: Moderately Integrated (04/20/2022)   Social Connection and Isolation Panel [NHANES]    Frequency of Communication with Friends and Family: More than three times a week    Frequency of Social Gatherings with Friends and Family: More than three times a week    Attends Religious Services: 1 to 4 times per year    Active Member of [employer] or Organizations: No    Attends Archivist Meetings: Never    Marital Status: Married  Human resources officer Violence: Not At Risk (04/20/2022)   Humiliation, Afraid, Rape, and Kick questionnaire    Fear of Current or Ex-Partner: No    Emotionally Abused: No    Physically Abused: No    Sexually Abused: No    Outpatient Medications Prior to Visit  Medication Sig Dispense Refill   albuterol (VENTOLIN HFA) 108 (90 Base) MCG/ACT inhaler INHALE 1 TO 2 PUFFS BY MOUTH EVERY 4 HOURS AS NEEDED FOR WHEEZING 8 g 2   aspirin 81 MG chewable tablet Chew 81 mg by mouth in the morning.      Blood Glucose Monitoring Suppl (ONETOUCH VERIO FLEX SYSTEM) w/Device KIT Check sugar once daily 1 kit 0   Budeson-Glycopyrrol-Formoterol (BREZTRI AEROSPHERE) 160-9-4.8 MCG/ACT AERO Inhale 2 puffs into the lungs 2 (two) times daily. 32.1 g 3    calcium-vitamin D (OSCAL WITH D) 500-200 MG-UNIT TABS tablet Take 1 tablet by mouth daily.     carvedilol (COREG) 12.5 MG tablet Take 12.5 mg by mouth 2 (two) times daily with a meal.     cephALEXin (KEFLEX) 500 MG capsule Take 500 mg by mouth 2 (two) times daily.     Cholecalciferol (VITAMIN D-3) 1000 UNITS  CAPS Take 1,000 Units by mouth daily.     digoxin (LANOXIN) 0.125 MG tablet Take 0.5 tablets (0.0625 mg total) by mouth daily. 30 tablet 1   empagliflozin (JARDIANCE) 10 MG TABS tablet Take 1 tablet by mouth once daily with breakfast 30 tablet 3   gabapentin (NEURONTIN) 100 MG capsule Take 1 capsule (100 mg total) by mouth at bedtime. 30 capsule 3   glucose blood (ONETOUCH VERIO) test strip USE 1 STRIP TO CHECK GLUCOSE ONCE DAILY 100 strip 12   lovastatin (MEVACOR) 40 MG tablet Take 1 tablet (40 mg total) by mouth at bedtime. 90 tablet 1   Magnesium 250 MG TABS Take 1 tablet (250 mg total) by mouth in the morning and at bedtime. 60 tablet 0   metFORMIN (GLUCOPHAGE) 500 MG tablet TAKE 1 TABLET BY MOUTH TWICE DAILY WITH MORNING MEAL AND WITH EVENING MEAL 180 tablet 0   Multiple Vitamins-Minerals (CENTRUM SILVER ULTRA WOMENS PO) Take 1 tablet by mouth daily.     nitroGLYCERIN (NITROSTAT) 0.4 MG SL tablet DISSOLVE ONE TABLET UNDER THE TONGUE EVERY 5 MINUTES AS NEEDED FOR CHEST PAIN.  DO NOT EXCEED A TOTAL OF 3 DOSES IN 15 MINUTES 25 tablet 0   OneTouch Delica Lancets 99991111 MISC USE 1  TO CHECK GLUCOSE ONCE DAILY *APPOINTMENT  REQUIRED  FOR  FUTURE  REFILLS 100 each 3   Polyethyl Glycol-Propyl Glycol (SYSTANE ULTRA) 0.4-0.3 % SOLN Place 1 drop into both eyes as needed (dry eyes).     sacubitril-valsartan (ENTRESTO) 49-51 MG Take 1 tablet by mouth 2 (two) times daily. 60 tablet 2   torsemide (DEMADEX) 20 MG tablet Take 0.5 tablets (10 mg total) by mouth daily. 90 tablet 1   venlafaxine XR (EFFEXOR-XR) 75 MG 24 hr capsule Take 1 capsule (75 mg total) by mouth daily with breakfast. 90 capsule 0   No  facility-administered medications prior to visit.    Allergies  Allergen Reactions   Atorvastatin Other (See Comments)    "muscle Cramps"   Budesonide Other (See Comments)    "fatigue"   Gabapentin Other (See Comments)    "fatigue"   Metoclopramide Other (See Comments)    "mayalgia"   Rosuvastatin Other (See Comments)    "muscle cramps"   Simvastatin Other (See Comments)    "muscle Cramps"   Wellbutrin [Bupropion]     Gi upset.    Sulfamethoxazole-Trimethoprim Rash    Review of Systems  Constitutional:  Positive for chills. Negative for fatigue and fever.  HENT:  Negative for sore throat.   Respiratory:  Positive for cough. Negative for shortness of breath.   Cardiovascular:  Positive for chest pain.  Gastrointestinal:  Negative for abdominal pain.  Musculoskeletal:  Negative for back pain and myalgias.       Left foot pain and swelling.    Neurological:  Negative for dizziness, weakness, light-headedness and headaches.  Psychiatric/Behavioral:  Negative for dysphoric mood. The patient is not nervous/anxious.        Objective:        11/01/2022    9:40 AM 10/05/2022    9:51 AM 10/05/2022    8:54 AM  Vitals with BMI  Height 5\' 1"   5\' 1"   Weight 95 lbs 10 oz  93 lbs 1 oz  BMI 0000000  [id number]  Systolic [id number]  Diastolic 58 63 64  Pulse 72 74 86    No data found.   Physical Exam  Health Maintenance Due  Topic Date Due  Zoster Vaccines- Shingrix (1 of 2) Never done   OPHTHALMOLOGY EXAM  01/12/2022   COVID-19 Vaccine (7 - 2023-24 season) 10/25/2022    There are no preventive care reminders to display for this patient.   Lab Results  Component Value Date   TSH 1.220 04/20/2022   Lab Results  Component Value Date   WBC 6.5 10/09/2022   HGB 11.1 10/09/2022   HCT 35.4 10/09/2022   MCV 86 10/09/2022   PLT 215 10/09/2022   Lab Results  Component Value Date   NA 137 10/13/2022   K 5.8 (H) 10/13/2022   CO2 25 10/13/2022   GLUCOSE 121 (H) 10/13/2022    BUN 35 (H) 10/13/2022   CREATININE 1.39 (H) 10/13/2022   BILITOT 0.3 10/13/2022   ALKPHOS 134 (H) 10/13/2022   AST 34 10/13/2022   ALT 26 10/13/2022   PROT 5.9 (L) 10/13/2022   ALBUMIN 4.1 10/13/2022   CALCIUM 10.1 10/13/2022   EGFR 39 (L) 10/13/2022   GFR 32.91 (L) 03/23/2020   Lab Results  Component Value Date   CHOL 150 10/04/2022   Lab Results  Component Value Date   HDL 85 10/04/2022   Lab Results  Component Value Date   LDLCALC 45 10/04/2022   Lab Results  Component Value Date   TRIG 119 10/04/2022   Lab Results  Component Value Date   CHOLHDL 1.8 10/04/2022   Lab Results  Component Value Date   HGBA1C 7.4 05/26/2022       Assessment & Plan:  Acute gout due to renal impairment involving toe of left foot Assessment & Plan: Sent Colchicine. Kenalog 80 IM #1.  Orders: -     Triamcinolone Acetonide -     Colchicine; Take 1 tablet (0.6 mg total) by mouth 2 (two) times daily.  Dispense: 14 tablet; Refill: 0     Meds ordered this encounter  Medications   triamcinolone acetonide (KENALOG-40) injection 80 mg   colchicine 0.6 MG tablet    Sig: Take 1 tablet (0.6 mg total) by mouth 2 (two) times daily.    Dispense:  14 tablet    Refill:  0    HOLD STATIN WHILE TAKING.    No orders of the defined types were placed in this encounter.    Follow-up: No follow-ups on file.  An After Visit Summary was printed and given to the patient.   Geralynn Ochs I Leal-Borjas,acting as a scribe for [name], MD.,have documented all relevant documentation on the behalf of [name], MD,as directed by  [name], MD while in the presence of [name], MD.    [name], MD [place] [phone]

## 2022-11-02 ENCOUNTER — Ambulatory Visit: Payer: Medicare Other | Admitting: Family Medicine

## 2022-11-04 DIAGNOSIS — M10372 Gout due to renal impairment, left ankle and foot: Secondary | ICD-10-CM | POA: Insufficient documentation

## 2022-11-04 NOTE — Assessment & Plan Note (Signed)
Sent Colchicine. Kenalog 80 IM #1.

## 2022-11-05 ENCOUNTER — Encounter: Payer: Self-pay | Admitting: Family Medicine

## 2022-11-07 DIAGNOSIS — Z1231 Encounter for screening mammogram for malignant neoplasm of breast: Secondary | ICD-10-CM | POA: Diagnosis not present

## 2022-11-07 LAB — HM MAMMOGRAPHY

## 2022-11-08 ENCOUNTER — Encounter: Payer: Self-pay | Admitting: Family Medicine

## 2022-11-11 DIAGNOSIS — I21A1 Myocardial infarction type 2: Secondary | ICD-10-CM | POA: Diagnosis not present

## 2022-11-11 DIAGNOSIS — I110 Hypertensive heart disease with heart failure: Secondary | ICD-10-CM | POA: Diagnosis not present

## 2022-11-11 DIAGNOSIS — I081 Rheumatic disorders of both mitral and tricuspid valves: Secondary | ICD-10-CM | POA: Diagnosis not present

## 2022-11-11 DIAGNOSIS — R0789 Other chest pain: Secondary | ICD-10-CM | POA: Diagnosis not present

## 2022-11-11 DIAGNOSIS — I214 Non-ST elevation (NSTEMI) myocardial infarction: Secondary | ICD-10-CM | POA: Diagnosis not present

## 2022-11-11 DIAGNOSIS — B9561 Methicillin susceptible Staphylococcus aureus infection as the cause of diseases classified elsewhere: Secondary | ICD-10-CM | POA: Diagnosis not present

## 2022-11-11 DIAGNOSIS — Q2572 Congenital pulmonary arteriovenous malformation: Secondary | ICD-10-CM | POA: Diagnosis not present

## 2022-11-11 DIAGNOSIS — E1122 Type 2 diabetes mellitus with diabetic chronic kidney disease: Secondary | ICD-10-CM | POA: Diagnosis not present

## 2022-11-11 DIAGNOSIS — R7881 Bacteremia: Secondary | ICD-10-CM | POA: Diagnosis not present

## 2022-11-11 DIAGNOSIS — I080 Rheumatic disorders of both mitral and aortic valves: Secondary | ICD-10-CM | POA: Diagnosis not present

## 2022-11-11 DIAGNOSIS — D631 Anemia in chronic kidney disease: Secondary | ICD-10-CM | POA: Diagnosis not present

## 2022-11-11 DIAGNOSIS — I5023 Acute on chronic systolic (congestive) heart failure: Secondary | ICD-10-CM | POA: Diagnosis not present

## 2022-11-11 DIAGNOSIS — J9600 Acute respiratory failure, unspecified whether with hypoxia or hypercapnia: Secondary | ICD-10-CM | POA: Diagnosis not present

## 2022-11-11 DIAGNOSIS — H9193 Unspecified hearing loss, bilateral: Secondary | ICD-10-CM | POA: Diagnosis not present

## 2022-11-11 DIAGNOSIS — E041 Nontoxic single thyroid nodule: Secondary | ICD-10-CM | POA: Diagnosis not present

## 2022-11-11 DIAGNOSIS — M109 Gout, unspecified: Secondary | ICD-10-CM | POA: Diagnosis not present

## 2022-11-11 DIAGNOSIS — E785 Hyperlipidemia, unspecified: Secondary | ICD-10-CM | POA: Diagnosis not present

## 2022-11-11 DIAGNOSIS — F339 Major depressive disorder, recurrent, unspecified: Secondary | ICD-10-CM | POA: Diagnosis not present

## 2022-11-11 DIAGNOSIS — N1831 Chronic kidney disease, stage 3a: Secondary | ICD-10-CM | POA: Diagnosis not present

## 2022-11-11 DIAGNOSIS — I351 Nonrheumatic aortic (valve) insufficiency: Secondary | ICD-10-CM | POA: Diagnosis not present

## 2022-11-11 DIAGNOSIS — J4530 Mild persistent asthma, uncomplicated: Secondary | ICD-10-CM | POA: Diagnosis not present

## 2022-11-11 DIAGNOSIS — Z8673 Personal history of transient ischemic attack (TIA), and cerebral infarction without residual deficits: Secondary | ICD-10-CM | POA: Diagnosis not present

## 2022-11-11 DIAGNOSIS — I428 Other cardiomyopathies: Secondary | ICD-10-CM | POA: Diagnosis not present

## 2022-11-11 DIAGNOSIS — J90 Pleural effusion, not elsewhere classified: Secondary | ICD-10-CM | POA: Diagnosis not present

## 2022-11-11 DIAGNOSIS — J9811 Atelectasis: Secondary | ICD-10-CM | POA: Diagnosis not present

## 2022-11-11 DIAGNOSIS — Z90710 Acquired absence of both cervix and uterus: Secondary | ICD-10-CM | POA: Diagnosis not present

## 2022-11-11 DIAGNOSIS — Z7982 Long term (current) use of aspirin: Secondary | ICD-10-CM | POA: Diagnosis not present

## 2022-11-11 DIAGNOSIS — D649 Anemia, unspecified: Secondary | ICD-10-CM | POA: Diagnosis not present

## 2022-11-11 DIAGNOSIS — I340 Nonrheumatic mitral (valve) insufficiency: Secondary | ICD-10-CM | POA: Diagnosis not present

## 2022-11-11 DIAGNOSIS — I130 Hypertensive heart and chronic kidney disease with heart failure and stage 1 through stage 4 chronic kidney disease, or unspecified chronic kidney disease: Secondary | ICD-10-CM | POA: Diagnosis not present

## 2022-11-11 DIAGNOSIS — I272 Pulmonary hypertension, unspecified: Secondary | ICD-10-CM | POA: Diagnosis not present

## 2022-11-11 DIAGNOSIS — R0602 Shortness of breath: Secondary | ICD-10-CM | POA: Diagnosis not present

## 2022-11-11 DIAGNOSIS — Z79899 Other long term (current) drug therapy: Secondary | ICD-10-CM | POA: Diagnosis not present

## 2022-11-11 DIAGNOSIS — I998 Other disorder of circulatory system: Secondary | ICD-10-CM | POA: Diagnosis not present

## 2022-11-11 DIAGNOSIS — I509 Heart failure, unspecified: Secondary | ICD-10-CM | POA: Diagnosis not present

## 2022-11-11 DIAGNOSIS — J9601 Acute respiratory failure with hypoxia: Secondary | ICD-10-CM | POA: Diagnosis not present

## 2022-11-11 DIAGNOSIS — Z7984 Long term (current) use of oral hypoglycemic drugs: Secondary | ICD-10-CM | POA: Diagnosis not present

## 2022-11-11 DIAGNOSIS — J449 Chronic obstructive pulmonary disease, unspecified: Secondary | ICD-10-CM | POA: Diagnosis not present

## 2022-11-11 HISTORY — DX: Non-ST elevation (NSTEMI) myocardial infarction: I21.4

## 2022-11-11 LAB — HEMOGLOBIN A1C: Hemoglobin A1C: 6.7

## 2022-11-14 DIAGNOSIS — I340 Nonrheumatic mitral (valve) insufficiency: Secondary | ICD-10-CM | POA: Insufficient documentation

## 2022-11-14 DIAGNOSIS — I351 Nonrheumatic aortic (valve) insufficiency: Secondary | ICD-10-CM | POA: Insufficient documentation

## 2022-11-15 ENCOUNTER — Ambulatory Visit: Payer: Medicare Other | Admitting: Endocrinology

## 2022-11-16 ENCOUNTER — Telehealth: Payer: Self-pay

## 2022-11-16 NOTE — Progress Notes (Unsigned)
Care Management & Coordination Services Pharmacy Team  Reason for Encounter: Diabetes  Contacted patient to discuss diabetes disease state. {US HC Outreach:28874}  Current antihyperglycemic regimen:  Jardiance 10 mg daily  Metformin 500 mg twice daily  Patient verbally confirms she is taking the above medications as directed. {yes/no:20286}  What diet changes have been made to improve diabetes control?  What recent interventions/DTPs have been made to improve glycemic control:  ***  Have there been any recent hospitalizations or ED visits since last visit with PharmD? {yes/no:20286}  Patient {reports/denies:24182} hypoglycemic symptoms, including {Hypoglycemic Symptoms:3049003}  Patient {reports/denies:24182} hyperglycemic symptoms, including {symptoms; hyperglycemia:17903}  How often are you checking your blood sugar? {BG Testing frequency:23922}  What are your blood sugars ranging?  Fasting: *** Before meals: *** After meals: *** Bedtime: ***  During the week, how often does your blood glucose drop below 70? {LowBGfrequency:24142}  Are you checking your feet daily/regularly? {yes/no:20286}  Adherence Review: Is the patient currently on a STATIN medication? Yes Is the patient currently on ACE/ARB medication? Yes Does the patient have >5 day gap between last estimated fill dates? Yes   Chart Updates:  Recent office visits:  11/01/22 [name] MD. Seen for gout. Started on Colchicine 0.6mg .   10/16/22 Scherrie Gerlach, Marla CMA. Orders Only. Decreased Entresto to 49-51mg .  10/10/22 Cox, Kirsten MD. Orders Only. D/C Spironolactone 25mg .   10/04/22 [name] MD. Seen for routine visit. No med changes.   Recent consult visits:  10/24/22 (Infectious Disease) Alver Fisher MD. Seen for Staph. Started on Cephalexin 500mg .   09/27/22 (Cardiology) Laureen Abrahams RN. Orders Only. Ordered Entresto 97-103mg .   09/25/22 (Oncology) Lavera Guise MD. Seen for iron def. No  med changes.  Hospital visits:  None  Medications: Outpatient Encounter Medications as of 11/16/2022  Medication Sig Note   albuterol (VENTOLIN HFA) 108 (90 Base) MCG/ACT inhaler INHALE 1 TO 2 PUFFS BY MOUTH EVERY 4 HOURS AS NEEDED FOR WHEEZING 04/07/2022: Uses 2 times daily    aspirin 81 MG chewable tablet Chew 81 mg by mouth in the morning.     Blood Glucose Monitoring Suppl (ONETOUCH VERIO FLEX SYSTEM) w/Device KIT Check sugar once daily    Budeson-Glycopyrrol-Formoterol (BREZTRI AEROSPHERE) 160-9-4.8 MCG/ACT AERO Inhale 2 puffs into the lungs 2 (two) times daily.    calcium-vitamin D (OSCAL WITH D) 500-200 MG-UNIT TABS tablet Take 1 tablet by mouth daily.    carvedilol (COREG) 12.5 MG tablet Take 12.5 mg by mouth 2 (two) times daily with a meal.    cephALEXin (KEFLEX) 500 MG capsule Take 500 mg by mouth 2 (two) times daily.    Cholecalciferol (VITAMIN D-3) 1000 UNITS CAPS Take 1,000 Units by mouth daily.    colchicine 0.6 MG tablet Take 1 tablet (0.6 mg total) by mouth 2 (two) times daily.    digoxin (LANOXIN) 0.125 MG tablet Take 0.5 tablets (0.0625 mg total) by mouth daily.    empagliflozin (JARDIANCE) 10 MG TABS tablet Take 1 tablet by mouth once daily with breakfast    gabapentin (NEURONTIN) 100 MG capsule Take 1 capsule (100 mg total) by mouth at bedtime.    glucose blood (ONETOUCH VERIO) test strip USE 1 STRIP TO CHECK GLUCOSE ONCE DAILY    lovastatin (MEVACOR) 40 MG tablet Take 1 tablet (40 mg total) by mouth at bedtime.    Magnesium 250 MG TABS Take 1 tablet (250 mg total) by mouth in the morning and at bedtime.    metFORMIN (GLUCOPHAGE) 500 MG tablet  TAKE 1 TABLET BY MOUTH TWICE DAILY WITH MORNING MEAL AND WITH EVENING MEAL    Multiple Vitamins-Minerals (CENTRUM SILVER ULTRA WOMENS PO) Take 1 tablet by mouth daily.    nitroGLYCERIN (NITROSTAT) 0.4 MG SL tablet DISSOLVE ONE TABLET UNDER THE TONGUE EVERY 5 MINUTES AS NEEDED FOR CHEST PAIN.  DO NOT EXCEED A TOTAL OF 3 DOSES IN 15  MINUTES    OneTouch Delica Lancets 99991111 MISC USE 1  TO CHECK GLUCOSE ONCE DAILY *APPOINTMENT  REQUIRED  FOR  FUTURE  REFILLS    Polyethyl Glycol-Propyl Glycol (SYSTANE ULTRA) 0.4-0.3 % SOLN Place 1 drop into both eyes as needed (dry eyes).    sacubitril-valsartan (ENTRESTO) 49-51 MG Take 1 tablet by mouth 2 (two) times daily.    torsemide (DEMADEX) 20 MG tablet Take 0.5 tablets (10 mg total) by mouth daily.    venlafaxine XR (EFFEXOR-XR) 75 MG 24 hr capsule Take 1 capsule (75 mg total) by mouth daily with breakfast.    No facility-administered encounter medications on file as of 11/16/2022.    Recent Relevant Labs: Lab Results  Component Value Date/Time   HGBA1C 7.4 05/26/2022 12:00 AM   HGBA1C 7.4 (H) 03/30/2022 08:56 AM   HGBA1C 6.6 (A) 10/26/2021 08:33 AM   HGBA1C 6.5 (A) 07/26/2021 08:30 AM   HGBA1C 6.7 (H) 10/22/2020 09:01 AM   MICROALBUR 4.0 (H) 03/25/2021 08:59 AM   MICROALBUR <0.7 03/23/2020 10:49 AM    Kidney Function Lab Results  Component Value Date/Time   CREATININE 1.39 (H) 10/13/2022 08:51 AM   CREATININE 1.67 (H) 10/09/2022 02:30 PM   GFR 32.91 (L) 03/23/2020 08:52 AM   GFRNONAA 60 09/08/2020 02:42 PM   [id number] 09/08/2020 02:42 PM    Star Rating Drugs:  Medication:  Last Fill: Day Supply Jardiance  10/25/22-09/27/22 30ds Metformin   10/10/22-07/03/22 90ds  Care Gaps: Annual wellness visit in last year? Yes Last eye exam / retinopathy screening:01/12/21 Last diabetic foot exam:10/04/22   Elray Mcgregor, [place] Pharmacist Assistant  [phone]

## 2022-11-23 DIAGNOSIS — I130 Hypertensive heart and chronic kidney disease with heart failure and stage 1 through stage 4 chronic kidney disease, or unspecified chronic kidney disease: Secondary | ICD-10-CM | POA: Diagnosis not present

## 2022-11-23 DIAGNOSIS — I340 Nonrheumatic mitral (valve) insufficiency: Secondary | ICD-10-CM | POA: Diagnosis not present

## 2022-11-23 DIAGNOSIS — I519 Heart disease, unspecified: Secondary | ICD-10-CM | POA: Diagnosis not present

## 2022-11-30 ENCOUNTER — Telehealth: Payer: Self-pay

## 2022-11-30 DIAGNOSIS — I5023 Acute on chronic systolic (congestive) heart failure: Secondary | ICD-10-CM | POA: Diagnosis not present

## 2022-11-30 DIAGNOSIS — Z950 Presence of cardiac pacemaker: Secondary | ICD-10-CM | POA: Diagnosis not present

## 2022-11-30 DIAGNOSIS — I351 Nonrheumatic aortic (valve) insufficiency: Secondary | ICD-10-CM | POA: Diagnosis not present

## 2022-11-30 DIAGNOSIS — I340 Nonrheumatic mitral (valve) insufficiency: Secondary | ICD-10-CM | POA: Diagnosis not present

## 2022-11-30 DIAGNOSIS — R0609 Other forms of dyspnea: Secondary | ICD-10-CM | POA: Diagnosis not present

## 2022-11-30 DIAGNOSIS — Z7409 Other reduced mobility: Secondary | ICD-10-CM | POA: Diagnosis not present

## 2022-11-30 DIAGNOSIS — I4719 Other supraventricular tachycardia: Secondary | ICD-10-CM | POA: Diagnosis not present

## 2022-11-30 DIAGNOSIS — I5020 Unspecified systolic (congestive) heart failure: Secondary | ICD-10-CM | POA: Diagnosis not present

## 2022-11-30 DIAGNOSIS — I519 Heart disease, unspecified: Secondary | ICD-10-CM | POA: Diagnosis not present

## 2022-11-30 NOTE — Progress Notes (Cosign Needed)
Called to schedule May or June follow up with CPP. Appt has been scheduled for 01/23/23 9:00 am.   Roxana Hires, CMA Clinical Pharmacist Assistant  [phone]

## 2022-12-01 DIAGNOSIS — I5020 Unspecified systolic (congestive) heart failure: Secondary | ICD-10-CM | POA: Diagnosis not present

## 2022-12-07 ENCOUNTER — Encounter: Payer: Self-pay | Admitting: Endocrinology

## 2022-12-07 ENCOUNTER — Ambulatory Visit (INDEPENDENT_AMBULATORY_CARE_PROVIDER_SITE_OTHER): Payer: Medicare Other | Admitting: Endocrinology

## 2022-12-07 VITALS — BP 112/72 | HR 68 | Ht 61.0 in | Wt 88.0 lb

## 2022-12-07 DIAGNOSIS — Z7984 Long term (current) use of oral hypoglycemic drugs: Secondary | ICD-10-CM | POA: Diagnosis not present

## 2022-12-07 DIAGNOSIS — E119 Type 2 diabetes mellitus without complications: Secondary | ICD-10-CM

## 2022-12-07 DIAGNOSIS — R634 Abnormal weight loss: Secondary | ICD-10-CM | POA: Diagnosis not present

## 2022-12-07 NOTE — Progress Notes (Addendum)
Patient ID: Bridget [name], female   DOB: Sep 05, 1940, 82 y.o.   MRN: [id number]   Reason for Appointment: follow-up   History of Present Illness   Diagnosis: Type 2 DIABETES MELITUS, date of diagnosis:  2007     Previous history: Her A1c at diagnosis was 7.9% She has been treated with a combination of metformin ER and Actos for several years Usually her A1c has been upper normal and her last A1c was 5.8 in 4/14  Recent history:  Her A1c is 6.7 done at admission in March, previously 7.4  Oral hypoglycemic drugs:   metformin  500 mg, twice daily, Jardiance 10 mg daily  She has continued Jardiance without difficulty Again checking blood sugars fasting and forgets to check them after meals After her recent hospitalization her blood sugars are as high as 187 but in the last few days they have ranged from 98 up to 124. She is still checking blood sugars only in the mornings despite reminders about checking after meals  Overall fasting blood sugars are significantly better compared to her last visit Currently no side effects with metformin twice a day She is eating smaller portions and recently losing weight, also has had recurrent hospitalizations for CHF Also she says that she is trying to cut back on intake especially at night because she is afraid her blood sugars will go up She cannot exercise with walking because of weakness and fatigue              Side effects from medications:  Abdominal discomfort from high-dose metformin       Monitors blood glucose:  once a day or less.    Glucometer: One Touch Ultra.           Blood Glucose readings from download   PRE-MEAL Fasting Lunch Dinner Bedtime Overall  Glucose range: 98-187      Mean/median:     131   POST-MEAL PC Breakfast PC Lunch PC Dinner  Glucose range:   ?  Mean/median:       PRE-MEAL Fasting Lunch Dinner Bedtime Overall  Glucose range: 82-156   ?   Mean/median:     109   Previously   PRE-MEAL  Fasting Lunch Dinner Bedtime Overall  Glucose range: 129-187      Mean/median:     152   POST-MEAL PC Breakfast PC Lunch PC Dinner  Glucose range:     Mean/median:                 Dietician visit: Most recent: 2007            Wt Readings from Last 3 Encounters:  12/07/22 88 lb (39.9 kg)  11/01/22 95 lb 9.6 oz (43.4 kg)  10/05/22 93 lb 1.3 oz (42.2 kg)   Lab Results  Component Value Date   HGBA1C 6.7 11/11/2022   HGBA1C 7.4 05/26/2022   HGBA1C 7.4 (H) 03/30/2022   Lab Results  Component Value Date   MICROALBUR 4.0 (H) 03/25/2021   LDLCALC 45 10/04/2022   CREATININE 1.39 (H) 10/13/2022   Lab Results  Component Value Date   CREATININE 1.39 (H) 10/13/2022   CREATININE 1.67 (H) 10/09/2022   CREATININE 1.58 (H) 10/04/2022   Lab Results  Component Value Date   HGB 11.1 10/09/2022    Other active problems discussed: See review of systems   Allergies as of 12/07/2022       Reactions   Atorvastatin Other (See Comments)   "  muscle Cramps"   Budesonide Other (See Comments)   "fatigue"   Gabapentin Other (See Comments)   "fatigue"   Metoclopramide Other (See Comments)   "mayalgia"   Rosuvastatin Other (See Comments)   "muscle cramps"   Simvastatin Other (See Comments)   "muscle Cramps"   Wellbutrin [bupropion]    Gi upset.    Sulfamethoxazole-trimethoprim Rash        Medication List        Accurate as of December 07, 2022  8:44 PM. If you have any questions, ask your nurse or doctor.          albuterol 108 (90 Base) MCG/ACT inhaler Commonly known as: VENTOLIN HFA INHALE 1 TO 2 PUFFS BY MOUTH EVERY 4 HOURS AS NEEDED FOR WHEEZING   aspirin 81 MG chewable tablet Chew 81 mg by mouth in the morning.   Breztri Aerosphere 160-9-4.8 MCG/ACT Aero Generic drug: Budeson-Glycopyrrol-Formoterol Inhale 2 puffs into the lungs 2 (two) times daily.   calcium-vitamin D 500-200 MG-UNIT Tabs tablet Commonly known as: OSCAL WITH D Take 1 tablet by mouth daily.    carvedilol 12.5 MG tablet Commonly known as: COREG Take 12.5 mg by mouth 2 (two) times daily with a meal.   CENTRUM SILVER ULTRA WOMENS PO Take 1 tablet by mouth daily.   cephALEXin 500 MG capsule Commonly known as: KEFLEX Take 500 mg by mouth 2 (two) times daily.   colchicine 0.6 MG tablet Take 1 tablet (0.6 mg total) by mouth 2 (two) times daily.   digoxin 0.125 MG tablet Commonly known as: LANOXIN Take 0.5 tablets (0.0625 mg total) by mouth daily.   Entresto 49-51 MG Generic drug: sacubitril-valsartan Take 1 tablet by mouth 2 (two) times daily.   gabapentin 100 MG capsule Commonly known as: NEURONTIN Take 1 capsule (100 mg total) by mouth at bedtime.   Jardiance 10 MG Tabs tablet Generic drug: empagliflozin Take 1 tablet by mouth once daily with breakfast   lovastatin 40 MG tablet Commonly known as: MEVACOR Take 1 tablet (40 mg total) by mouth at bedtime.   Magnesium 250 MG Tabs Take 1 tablet (250 mg total) by mouth in the morning and at bedtime.   metFORMIN 500 MG tablet Commonly known as: GLUCOPHAGE TAKE 1 TABLET BY MOUTH TWICE DAILY WITH MORNING MEAL AND WITH EVENING MEAL   nitroGLYCERIN 0.4 MG SL tablet Commonly known as: NITROSTAT DISSOLVE ONE TABLET UNDER THE TONGUE EVERY 5 MINUTES AS NEEDED FOR CHEST PAIN.  DO NOT EXCEED A TOTAL OF 3 DOSES IN 15 MINUTES   OneTouch Delica Lancets 33G Misc USE 1  TO CHECK GLUCOSE ONCE DAILY *APPOINTMENT  REQUIRED  FOR  FUTURE  REFILLS   OneTouch Verio Flex System w/Device Kit Check sugar once daily   OneTouch Verio test strip Generic drug: glucose blood USE 1 STRIP TO CHECK GLUCOSE ONCE DAILY   Systane Ultra 0.4-0.3 % Soln Generic drug: Polyethyl Glycol-Propyl Glycol Place 1 drop into both eyes as needed (dry eyes).   torsemide 20 MG tablet Commonly known as: DEMADEX Take 0.5 tablets (10 mg total) by mouth daily.   venlafaxine XR 75 MG 24 hr capsule Commonly known as: EFFEXOR-XR Take 1 capsule (75 mg total)  by mouth daily with breakfast.   Vitamin D-3 25 MCG (1000 UT) Caps Take 1,000 Units by mouth daily.        Allergies:  Allergies  Allergen Reactions   Atorvastatin Other (See Comments)    "muscle Cramps"   Budesonide Other (See  Comments)    "fatigue"   Gabapentin Other (See Comments)    "fatigue"   Metoclopramide Other (See Comments)    "mayalgia"   Rosuvastatin Other (See Comments)    "muscle cramps"   Simvastatin Other (See Comments)    "muscle Cramps"   Wellbutrin [Bupropion]     Gi upset.    Sulfamethoxazole-Trimethoprim Rash    Past Medical History:  Diagnosis Date   Anemia    Anxiety and depression    Arthritis    HANDS,ARMS,FEET   Asthma    Atrioventricular block    CAD (coronary artery disease)    CHF (congestive heart failure) (HCC)    Chronic renal insufficiency    Clotting disorder (HCC)    Complication of anesthesia    Difficulty waking up once.   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 Otherwise normal colonoscopy   ESOPHAGOGASTRODUODENOSCOPY  11/16/2005   [facility]. Patchy areas of mucosal erythema in the antrum compatible with gastritis. Polyps in the stomach. Nodule in the antrum. Medium hiatal hernia   ESOPHAGOGASTRODUODENOSCOPY (EGD) WITH PROPOFOL N/A 04/13/2022   Procedure: ESOPHAGOGASTRODUODENOSCOPY (EGD) WITH PROPOFOL;  Surgeon: Lynann Bologna, MD;  Location: [facility];  Service: Gastroenterology;  Laterality: N/A;   MALONEY DILATION  04/13/2022   Procedure: Elease Hashimoto DILATION;  Surgeon: Lynann Bologna, MD;  Location: [facility];  Service: Gastroenterology;;   pacemaker  09/27/2016   PACEMAKER REVISION  09/27/2020   PACEMAKER UPGRADE DUAL CHAMBER TO BIV    Family History  Problem Relation Age of Onset   High blood pressure Mother    Diabetes Mother        no medication needed   Kidney disease Mother    Pneumonia Mother    Colon polyps Father    High blood pressure Father    Colon cancer Father    Cancer Maternal Grandmother        Leukemia   Cancer Other        Ovarian   Rectal cancer Neg Hx    Stomach cancer Neg Hx    Esophageal cancer Neg Hx    Liver cancer Neg Hx    Pancreatic cancer Neg Hx    Crohn's disease Neg Hx     Social History:  reports that she has never smoked. She has been exposed  to tobacco smoke. She has never used smokeless tobacco. She reports that she does not drink alcohol and does not use drugs.  Review of Systems:  Foot pain: She has been given gabapentin to help with this  Hypertension:  blood pressure is treated with Coreg, and, treated by other physicians Also now on Entresto  She has a history of CAD followed by cardiologist  Lipids: She has had statin intolerance with at least atorvastatin, simvastatin and rosuvastatin She was prescribed lovastatin by her PCP, LDL is last improved  Lab Results  Component Value Date   CHOL 150 10/04/2022   CHOL 160 09/22/2022   CHOL 216 (H)  03/30/2022   Lab Results  Component Value Date   HDL 85 10/04/2022   HDL 85 09/22/2022   HDL 76 03/30/2022   Lab Results  Component Value Date   LDLCALC 45 10/04/2022   LDLCALC 52 09/22/2022   LDLCALC 114 (H) 03/30/2022   Lab Results  Component Value Date   TRIG 119 10/04/2022   TRIG 135 09/22/2022   TRIG 153 (H) 03/30/2022   Lab Results  Component Value Date   CHOLHDL 1.8 10/04/2022   CHOLHDL 1.9 09/22/2022   CHOLHDL 2.8 03/30/2022   Lab Results  Component Value Date   LDLDIRECT 94.2 12/05/2013     THYROID:  She was previously on thyroid suppression because of her long-standing benign thyroid nodule.  She has had 2 biopsies done on this previously Because of low normal TSH her 50 mcg Synthroid was stopped in 10/14 TSH has been consistently normal subsequently  She had a small left-sided thyroid nodule palpated on her visit in 12/20 and right thyroid nodule is relatively small  Her ultrasound last showed mostly cystic nodule in the right side about 1.6 cm  Lab Results  Component Value Date   TSH 1.220 04/20/2022   TSH 2.040 07/22/2021   TSH 1.64 03/23/2020   Last ophthalmologic exam: 3/22    Examination:   BP 112/72 (BP Location: Left Arm, Patient Position: Sitting, Cuff Size: Small)   Pulse 68   Ht 5\' 1"  (1.549 m)   Wt 88 lb (39.9 kg)   SpO2 95%   BMI 16.63 kg/m   Body mass index is 16.63 kg/m.   No pedal edema   ASSESSMENT/ PLAN:   Diabetes type 2 on metformin 500 mg twice a day and Jardiance 10 mg  A1c is 6.7 recently  With continuing Jardiance her glucose control has improved but also may be from cutting back on portions and carbohydrates also Fasting blood sugars recently are excellent No readings done after meals  Renal function adequate for her metformin therapy, records from last hospitalization reviewed  Discussed with the patient that since her weight has gone down significantly and she is asthenic that she needs to increase her  portions and snacks to keep her weight going improving Also agrees to see the dietitian Explained to her that even if her blood sugar up to about 140 or 150 fasting this is reasonably okay considering her age and comorbid conditions She will also try to check some eating 2 to 3 hours after meals and not daily in the morning Okay to use artificial sweeteners but more natural sources such as Stevia  Patient Instructions  Check blood sugars on waking 3 up days a week  Also check blood sugars about 2 hours after meals and do this after different meals by rotation  Recommended blood sugar levels on waking up are  90-130 and about 2 hours after meal is 130-160  Please bring your blood sugar monitor to each visit, thank you  Lowella Grip or Stevia sweetener   Reather Littler 12/07/2022, 8:44 PM

## 2022-12-07 NOTE — Patient Instructions (Addendum)
Check blood sugars on waking 3 up days a week  Also check blood sugars about 2 hours after meals and do this after different meals by rotation  Recommended blood sugar levels on waking up are 90-130 and about 2 hours after meal is 130-160  Please bring your blood sugar monitor to each visit, thank you  Lowella Grip or Stevia sweetener

## 2022-12-08 DIAGNOSIS — I10 Essential (primary) hypertension: Secondary | ICD-10-CM | POA: Diagnosis not present

## 2022-12-08 DIAGNOSIS — N1832 Chronic kidney disease, stage 3b: Secondary | ICD-10-CM | POA: Diagnosis not present

## 2022-12-24 NOTE — Progress Notes (Unsigned)
[facility]  [address] [place],  [place]  [address] [phone]  Clinic Day:  12/25/2022  Referring physician: Blane Ohara, MD  HISTORY OF PRESENT ILLNESS:  The patient is a 82 y.o. female with anemia secondary to chronic renal insufficiency.  Most recently, labs showed evidence of iron deficiency anemia for which she recently received IV iron.  She comes in today to reassess her iron and hemoglobin levels.  Since her last visit, the patient has been hospitalized and is currently being treated for congestive heart failure.  She denies having increased fatigue or any overt forms of blood loss which concern her for progressive anemia.  PHYSICAL EXAM:  Blood pressure (!) 119/58, pulse 92, temperature 97.7 F (36.5 C), temperature source Oral, resp. rate 18, height 5\' 1"  (1.549 m), weight 86 lb 11.2 oz (39.3 kg), SpO2 94 %. Wt Readings from Last 3 Encounters:  12/25/22 86 lb 11.2 oz (39.3 kg)  12/07/22 88 lb (39.9 kg)  11/01/22 95 lb 9.6 oz (43.4 kg)   Body mass index is 16.38 kg/m. Performance status (ECOG): 1 - Symptomatic but completely ambulatory Physical Exam Constitutional:      Appearance: Normal appearance. She is not ill-appearing.     Comments: She looks thinner vs previous visits  HENT:     Mouth/Throat:     Mouth: Mucous membranes are moist.     Pharynx: Oropharynx is clear. No oropharyngeal exudate or posterior oropharyngeal erythema.  Cardiovascular:     Rate and Rhythm: Normal rate and regular rhythm.     Heart sounds: No murmur heard.    No friction rub. No gallop.  Pulmonary:     Effort: Pulmonary effort is normal. No respiratory distress.     Breath sounds: Normal breath sounds. No wheezing, rhonchi or rales.  Abdominal:     General: Bowel sounds are normal. There is no distension.     Palpations: Abdomen is soft. There is no mass.     Tenderness: There is no abdominal tenderness.  Musculoskeletal:        General: No  swelling.     Right lower leg: No edema.     Left lower leg: No edema.  Lymphadenopathy:     Cervical: No cervical adenopathy.     Upper Body:     Right upper body: No supraclavicular or axillary adenopathy.     Left upper body: No supraclavicular or axillary adenopathy.     Lower Body: No right inguinal adenopathy. No left inguinal adenopathy.  Skin:    General: Skin is warm.     Coloration: Skin is not jaundiced.     Findings: No rash.  Neurological:     General: No focal deficit present.     Mental Status: She is alert and oriented to person, place, and time. Mental status is at baseline.  Psychiatric:        Mood and Affect: Mood normal.        Behavior: Behavior normal.        Thought Content: Thought content normal.           LABS:     Latest Reference Range & Units 12/25/22 09:51  Sodium 135 - 145 mmol/L 137  Potassium 3.5 - 5.1 mmol/L 4.2  Chloride 98 - 111 mmol/L 103  CO2 22 - 32 mmol/L 23  Glucose 70 - 99 mg/dL 914 (H)  BUN 8 - 23 mg/dL 57 (H)  Creatinine 7.82 - 1.00 mg/dL 9.56 (  H)  Calcium 8.9 - 10.3 mg/dL 9.4  Anion gap 5 - 15  11  Alkaline Phosphatase 38 - 126 U/L 107  Albumin 3.5 - 5.0 g/dL 3.6  AST 15 - 41 U/L 34  ALT 0 - 44 U/L 47 (H)  Total Protein 6.5 - 8.1 g/dL 6.7  Total Bilirubin 0.3 - 1.2 mg/dL 0.9  (H): Data is abnormally high  Latest Reference Range & Units 12/25/22 09:51  Iron 28 - 170 ug/dL 84  UIBC ug/dL 098  TIBC 119 - 147 ug/dL 829  Saturation Ratios 10.4 - 31.8 % 23  Ferritin 11 - 307 ng/mL 466 (H)  (H): Data is abnormally high  ASSESSMENT & PLAN:  Assessment/Plan:  A 82 y.o. female with anemia secondary to previous renal insufficiency as well as recent iron deficiency.  I am very pleased with the improvement in her hemoglobin since her IV iron was given.  Furthermore, her iron parameters are considerably better.  Overall, she appears to be doing well from a hematologic standpoint.  I will see her back in 6 months for repeat clinical  assessment. The patient understands all the plans discussed today and is in agreement with them.    Destany Severns Kirby Funk, MD

## 2022-12-25 ENCOUNTER — Other Ambulatory Visit: Payer: Self-pay | Admitting: Oncology

## 2022-12-25 ENCOUNTER — Inpatient Hospital Stay: Payer: Medicare Other | Attending: Oncology | Admitting: Oncology

## 2022-12-25 ENCOUNTER — Inpatient Hospital Stay: Payer: Medicare Other

## 2022-12-25 ENCOUNTER — Telehealth: Payer: Self-pay | Admitting: Oncology

## 2022-12-25 ENCOUNTER — Telehealth: Payer: Self-pay

## 2022-12-25 VITALS — BP 119/58 | HR 92 | Temp 97.7°F | Resp 18 | Ht 61.0 in | Wt 86.7 lb

## 2022-12-25 DIAGNOSIS — D649 Anemia, unspecified: Secondary | ICD-10-CM | POA: Diagnosis not present

## 2022-12-25 DIAGNOSIS — D508 Other iron deficiency anemias: Secondary | ICD-10-CM

## 2022-12-25 DIAGNOSIS — D631 Anemia in chronic kidney disease: Secondary | ICD-10-CM

## 2022-12-25 DIAGNOSIS — N189 Chronic kidney disease, unspecified: Secondary | ICD-10-CM | POA: Diagnosis not present

## 2022-12-25 DIAGNOSIS — D509 Iron deficiency anemia, unspecified: Secondary | ICD-10-CM | POA: Diagnosis not present

## 2022-12-25 DIAGNOSIS — D638 Anemia in other chronic diseases classified elsewhere: Secondary | ICD-10-CM | POA: Diagnosis not present

## 2022-12-25 LAB — CMP (CANCER CENTER ONLY)
ALT: 47 U/L — ABNORMAL HIGH (ref 0–44)
AST: 34 U/L (ref 15–41)
Albumin: 3.6 g/dL (ref 3.5–5.0)
Alkaline Phosphatase: 107 U/L (ref 38–126)
Anion gap: 11 (ref 5–15)
BUN: 57 mg/dL — ABNORMAL HIGH (ref 8–23)
CO2: 23 mmol/L (ref 22–32)
Calcium: 9.4 mg/dL (ref 8.9–10.3)
Chloride: 103 mmol/L (ref 98–111)
Creatinine: 1.14 mg/dL — ABNORMAL HIGH (ref 0.44–1.00)
GFR, Estimated: 49 mL/min — ABNORMAL LOW (ref >60)
Glucose, Bld: 119 mg/dL — ABNORMAL HIGH (ref 70–99)
Potassium: 4.2 mmol/L (ref 3.5–5.1)
Sodium: 137 mmol/L (ref 135–145)
Total Bilirubin: 0.9 mg/dL (ref 0.3–1.2)
Total Protein: 6.7 g/dL (ref 6.5–8.1)

## 2022-12-25 LAB — IRON AND TIBC
Iron: 84 ug/dL (ref 28–170)
Saturation Ratios: 23 % (ref 10.4–31.8)
TIBC: 374 ug/dL (ref 250–450)
UIBC: 290 ug/dL

## 2022-12-25 LAB — CBC AND DIFFERENTIAL
HCT: 40 (ref 36–46)
Hemoglobin: 13.4 (ref 12.0–16.0)
Neutrophils Absolute: 5.18
Platelets: 153 10*3/uL (ref 150–400)
WBC: 7.1

## 2022-12-25 LAB — CBC: RBC: 4.35 (ref 3.87–5.11)

## 2022-12-25 LAB — FERRITIN: Ferritin: 466 ng/mL — ABNORMAL HIGH (ref 11–307)

## 2022-12-25 NOTE — Telephone Encounter (Signed)
Dr Melvyn Neth reviewed the labs below and states, "I'm fine with those numbers". I notified pt's spouse of Dr Melvyn Neth' response. He verbalized understanding.   Latest Reference Range & Units 12/25/22 09:51  Iron 28 - 170 ug/dL 84  UIBC ug/dL 098  TIBC 119 - 147 ug/dL 829  Saturation Ratios 10.4 - 31.8 % 23  Ferritin 11 - 307 ng/mL 466 (H)  (H): Data is abnormally high

## 2022-12-25 NOTE — Telephone Encounter (Signed)
5/13/24Spoke with patients husband and scheduled next appt

## 2023-01-02 ENCOUNTER — Telehealth: Payer: Self-pay

## 2023-01-02 NOTE — Telephone Encounter (Signed)
Patient's husband informed

## 2023-01-02 NOTE — Telephone Encounter (Signed)
Patient's husband called stating that the patient has been feeling weak and nervous and was wanting to schedule her an appointment. He states that he had already called the cardiologist first because he wanted to make sure it was nothing to do with her CHF but the patient is experiencing weakness and nervousness which he states her weight has not changed, and all her vitals were good pulse ox 93, BP 113/86, Fasting sugar 130, temp: was normal. He states that he is concerned maybe she is dehydrated? He does state he tries to keep up with the amount of fluids that she drinks a day. He does feel sometimes she does not drink enough and sometimes her appetite is not the best either. Please advise either appointment or recommendation.

## 2023-01-05 DIAGNOSIS — I340 Nonrheumatic mitral (valve) insufficiency: Secondary | ICD-10-CM | POA: Diagnosis not present

## 2023-01-05 DIAGNOSIS — I1310 Hypertensive heart and chronic kidney disease without heart failure, with stage 1 through stage 4 chronic kidney disease, or unspecified chronic kidney disease: Secondary | ICD-10-CM | POA: Diagnosis not present

## 2023-01-05 DIAGNOSIS — I129 Hypertensive chronic kidney disease with stage 1 through stage 4 chronic kidney disease, or unspecified chronic kidney disease: Secondary | ICD-10-CM | POA: Diagnosis not present

## 2023-01-05 DIAGNOSIS — N183 Chronic kidney disease, stage 3 unspecified: Secondary | ICD-10-CM | POA: Diagnosis not present

## 2023-01-09 DIAGNOSIS — Z45018 Encounter for adjustment and management of other part of cardiac pacemaker: Secondary | ICD-10-CM | POA: Diagnosis not present

## 2023-01-09 DIAGNOSIS — Z950 Presence of cardiac pacemaker: Secondary | ICD-10-CM | POA: Diagnosis not present

## 2023-01-09 DIAGNOSIS — I519 Heart disease, unspecified: Secondary | ICD-10-CM | POA: Diagnosis not present

## 2023-01-09 DIAGNOSIS — I442 Atrioventricular block, complete: Secondary | ICD-10-CM | POA: Diagnosis not present

## 2023-01-09 DIAGNOSIS — I428 Other cardiomyopathies: Secondary | ICD-10-CM | POA: Diagnosis not present

## 2023-01-12 ENCOUNTER — Other Ambulatory Visit: Payer: Self-pay | Admitting: Family Medicine

## 2023-01-15 ENCOUNTER — Other Ambulatory Visit: Payer: Self-pay | Admitting: Endocrinology

## 2023-01-15 DIAGNOSIS — E119 Type 2 diabetes mellitus without complications: Secondary | ICD-10-CM

## 2023-01-16 ENCOUNTER — Other Ambulatory Visit: Payer: Self-pay | Admitting: Family Medicine

## 2023-01-17 ENCOUNTER — Telehealth: Payer: Self-pay

## 2023-01-17 NOTE — Assessment & Plan Note (Signed)
Well controlled.  ?No changes to medicines.  ?Continue to work on eating a healthy diet and exercise.  ?Labs drawn today.  ?

## 2023-01-17 NOTE — Progress Notes (Signed)
Subjective:  Patient ID: Bridget [name], female    DOB: 08-Sep-1940  Age: 82 y.o. MRN: [id number]  Chief Complaint  Patient presents with   Medical Management of Chronic Issues    HPI Diabetes:  Complications: CKD Most recent A1C: 6.7 Checking sugars daily. 85-177 Current medications: Metformin 500 mg take 1 tablet twice daily. Jardiance 10 mg daily, and gabapentin 100 mg before bed.  Last Eye Exam:01/12/2021. Foot checks: daily Dr. Lucianne Muss endocrinologist.   Hyperlipidemia: Current medications:Lovastatin 40 mg take 1 tablet daily  Hypertension with chf: Current medications: furosemide 20 mg daily, entresto 49/51 mg twice daily, digoxin 0.125 mg 1/2 every other day, carvedilol 12.5 mg 1/4 twice daily, baby aspirin 81 mg daily, Ntg 0.4 mg SL every 5 minutes as needed chest pain. Patient sees Dr. Judithe Modest. Dr. Judithe Modest discontinue xarelto in 06/2022.  Hypomagnesemia: Mag 250 mg twice daily.   Depression: Patient is currently taking Venlafaxine XR 75 mg take 1 capsule daily. Doing well .  GERD: on omeprazole 20 mg daily. Having dysphagia occasionally. Has had a full evaluation.   History of DVT: xarelto 15 mg but this has been discontinued  Mild Persistent Asthma: Patient is currently taking Breztri 2 puffs twice daily  Diet: good Exercise: minimal  Insomnia: trouble falling asleep. Takes naps.      01/18/2023    7:52 AM 11/01/2022    9:47 AM 10/04/2022    7:56 AM 09/22/2022    8:23 AM 09/22/2022    8:22 AM  Depression screen PHQ 2/9  Decreased Interest 0 0 0  0  Down, Depressed, Hopeless 1 0 1  0  PHQ - 2 Score 1 0 1  0  Altered sleeping 1  1    Tired, decreased energy 1  3 2    Change in appetite 1  1 1    Feeling bad or failure about yourself  0  1 0   Trouble concentrating 1  2 0   Moving slowly or fidgety/restless 0  0 0   Suicidal thoughts 0  0 0   PHQ-9 Score 5  9    Difficult doing work/chores Not difficult at all  Not difficult at all Not difficult at all          01/18/2023    7:52 AM  Fall Risk   Falls in the past year? 0  Number falls in past yr: 0  Injury with Fall? 0  Risk for fall due to : No Fall Risks  Follow up Falls evaluation completed    Patient Care Team: Blane Ohara, MD as PCP - General (Family Medicine) Reather Littler, MD as Consulting Physician (Endocrinology) Weston Settle, MD as Consulting Physician (Oncology) Diamond Nickel., MD as Referring Physician (Cardiology) Lynann Bologna, MD as Consulting Physician (Gastroenterology) [facility] (Ophthalmology) Zettie Pho, [facility] (Pharmacist) Diamond Nickel., MD as Referring Physician (Cardiology) Ninfa Meeker, FNP (Inactive) (Family Medicine) Ronda Fairly, MD (Infectious Diseases)   Review of Systems  Constitutional:  Negative for chills, fatigue and fever.  HENT:  Negative for congestion, ear pain, rhinorrhea and sore throat.   Respiratory:  Negative for cough and shortness of breath.   Cardiovascular:  Negative for chest pain.  Gastrointestinal:  Negative for abdominal pain, constipation, diarrhea, nausea and vomiting.  Genitourinary:  Negative for dysuria and urgency.  Musculoskeletal:  Negative for back pain and myalgias.  Neurological:  Negative for dizziness, weakness, light-headedness and headaches.  Psychiatric/Behavioral:  Negative for dysphoric mood. The  patient is not nervous/anxious.     Current Outpatient Medications on File Prior to Visit  Medication Sig Dispense Refill   furosemide (LASIX) 20 MG tablet Take 20 mg by mouth daily.     aspirin 81 MG chewable tablet Chew 81 mg by mouth in the morning.      Blood Glucose Monitoring Suppl (ONETOUCH VERIO FLEX SYSTEM) w/Device KIT Check sugar once daily 1 kit 0   Budeson-Glycopyrrol-Formoterol (BREZTRI AEROSPHERE) 160-9-4.8 MCG/ACT AERO Inhale 2 puffs into the lungs 2 (two) times daily. 32.1 g 3   carvedilol (COREG) 12.5 MG tablet Take 12.5 mg by mouth 2 (two) times daily with a meal.      cephALEXin (KEFLEX) 500 MG capsule Take 500 mg by mouth 2 (two) times daily.     Cholecalciferol (VITAMIN D-3) 1000 UNITS CAPS Take 1,000 Units by mouth daily.     empagliflozin (JARDIANCE) 10 MG TABS tablet Take 1 tablet by mouth once daily with breakfast 30 tablet 3   glucose blood (ONETOUCH VERIO) test strip USE 1 STRIP TO CHECK GLUCOSE ONCE DAILY 100 strip 12   lovastatin (MEVACOR) 40 MG tablet Take 1 tablet (40 mg total) by mouth at bedtime. 90 tablet 1   Magnesium 250 MG TABS Take 1 tablet (250 mg total) by mouth in the morning and at bedtime. 60 tablet 0   metFORMIN (GLUCOPHAGE) 500 MG tablet TAKE 1 TABLET BY MOUTH TWICE DAILY WITH MORNING MEAL AND WITH EVENING MEAL 180 tablet 0   Multiple Vitamins-Minerals (CENTRUM SILVER ULTRA WOMENS PO) Take 1 tablet by mouth daily.     Polyethyl Glycol-Propyl Glycol (SYSTANE ULTRA) 0.4-0.3 % SOLN Place 1 drop into both eyes as needed (dry eyes).     sacubitril-valsartan (ENTRESTO) 49-51 MG Take 1 tablet by mouth 2 (two) times daily. 60 tablet 2   venlafaxine XR (EFFEXOR-XR) 75 MG 24 hr capsule Take 1 capsule (75 mg total) by mouth daily with breakfast. 90 capsule 0   No current facility-administered medications on file prior to visit.   Past Medical History:  Diagnosis Date   Anemia    Anxiety and depression    Arthritis    HANDS,ARMS,FEET   Asthma    Atrioventricular block    CAD (coronary artery disease)    CHF (congestive heart failure) (HCC)    Chronic renal insufficiency    Clotting disorder (HCC)    Complication of anesthesia    Difficulty waking up once.   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 Otherwise normal colonoscopy   ESOPHAGOGASTRODUODENOSCOPY  11/16/2005   [facility]. Patchy areas of mucosal erythema in the antrum compatible with gastritis. Polyps in the stomach. Nodule in the antrum. Medium hiatal hernia   ESOPHAGOGASTRODUODENOSCOPY (EGD) WITH PROPOFOL N/A 04/13/2022   Procedure: ESOPHAGOGASTRODUODENOSCOPY (EGD) WITH PROPOFOL;  Surgeon: Lynann Bologna, MD;  Location: [facility];  Service: Gastroenterology;  Laterality: N/A;   MALONEY DILATION  04/13/2022   Procedure: Elease Hashimoto DILATION;  Surgeon: Lynann Bologna, MD;  Location: [facility];  Service: Gastroenterology;;   pacemaker  09/27/2016   PACEMAKER REVISION  09/27/2020   PACEMAKER UPGRADE DUAL CHAMBER TO BIV    Family History  Problem Relation Age of Onset   High blood pressure Mother    Diabetes Mother        no medication needed   Kidney disease Mother    Pneumonia Mother    Colon polyps Father    High blood pressure Father    Colon cancer Father    Cancer Maternal Grandmother        Leukemia   Cancer Other        Ovarian   Rectal cancer Neg Hx    Stomach cancer Neg Hx    Esophageal cancer Neg Hx    Liver cancer Neg Hx    Pancreatic cancer Neg Hx    Crohn's disease Neg Hx    Social History   Socioeconomic History   Marital status: Married     Spouse name: Darryl   Number of children: 3   Years of education: Not on file   Highest education level: 9th grade  Occupational History   Occupation: Retired  Tobacco Use   Smoking status: Never    Passive exposure: Past   Smokeless tobacco: Never  Vaping Use   Vaping Use: Never used  Substance and Sexual Activity   Alcohol use: Never   Drug use: Never   Sexual activity: Not on file  Other Topics Concern   Not on file  Social History Narrative   Lives at home with her husband   Right handed   Caffeine: mostly tea, 0-2 cups a day   Social Determinants of Health   Financial Resource Strain: High Risk (01/23/2023)   Overall Financial Resource Strain (CARDIA)    Difficulty of Paying Living Expenses: Hard  Food Insecurity: No Food Insecurity (06/08/2022)   Hunger Vital Sign    Worried About Running Out of Food in the Last Year: Never true    Ran Out of Food in the Last Year: Never true  Transportation Needs: No Transportation Needs (01/23/2023)   PRAPARE - Administrator, Civil Service (Medical): No    Lack of Transportation (Non-Medical): No  Physical Activity: Inactive (04/20/2022)   Exercise Vital Sign    Days of Exercise per Week: 0 days    Minutes of Exercise per Session: 0 min  Stress: No Stress Concern Present (04/20/2022)   [employer] of Occupational Health - Occupational Stress Questionnaire    Feeling of Stress : Only a little  Social Connections: Moderately Integrated (04/20/2022)   Social Connection and Isolation Panel [NHANES]    Frequency of Communication with Friends and Family: More than three times a week    Frequency of Social Gatherings with Friends and Family: More than three times a  week    Attends Religious Services: 1 to 4 times per year    Active Member of Clubs or Organizations: No    Attends Banker Meetings: Never    Marital Status: Married    Objective:  BP (!) 118/58   Pulse 85   Temp (!) 97 F (36.1 C)   Ht 5'  1" (1.549 m)   Wt 85 lb (38.6 kg)   SpO2 95%   BMI 16.06 kg/m      01/18/2023    7:46 AM 12/25/2022   10:05 AM 12/07/2022   11:18 AM  BP/Weight  Systolic BP 118 119 112  Diastolic BP 58 58 72  Wt. (Lbs) 85 86.7 88  BMI 16.06 kg/m2 16.38 kg/m2 16.63 kg/m2    Physical Exam Vitals reviewed.  Constitutional:      Appearance: Normal appearance. She is normal weight.  Neck:     Vascular: No carotid bruit.  Cardiovascular:     Rate and Rhythm: Normal rate and regular rhythm.     Heart sounds: Normal heart sounds.  Pulmonary:     Effort: Pulmonary effort is normal. No respiratory distress.     Breath sounds: Normal breath sounds.  Abdominal:     General: Abdomen is flat. Bowel sounds are normal.     Palpations: Abdomen is soft.     Tenderness: There is no abdominal tenderness.  Skin:    Comments: Tenting.  Neurological:     Mental Status: She is alert and oriented to person, place, and time.  Psychiatric:        Mood and Affect: Mood normal.        Behavior: Behavior normal.     Diabetic Foot Exam - Simple   Simple Foot Form  01/18/2023 12:26 AM  Visual Inspection No deformities, no ulcerations, no other skin breakdown bilaterally: Yes Sensation Testing Intact to touch and monofilament testing bilaterally: Yes Pulse Check Posterior Tibialis and Dorsalis pulse intact bilaterally: Yes Comments      Lab Results  Component Value Date   WBC 7.0 01/18/2023   HGB 13.1 01/18/2023   HCT 39.8 01/18/2023   PLT 176 01/18/2023   GLUCOSE 125 (H) 01/18/2023   CHOL 167 01/18/2023   TRIG 110 01/18/2023   HDL 80 01/18/2023   LDLDIRECT 94.2 12/05/2013   LDLCALC 68 01/18/2023   ALT 29 01/18/2023   AST 30 01/18/2023   NA 139 01/18/2023   K 5.0 01/18/2023   CL 103 01/18/2023   CREATININE 1.25 (H) 01/18/2023   BUN 42 (H) 01/18/2023   CO2 21 01/18/2023   TSH 1.220 04/20/2022   INR 1.3 (H) 11/12/2020   HGBA1C 6.7 11/11/2022   MICROALBUR 4.0 (H) 03/25/2021      Assessment  & Plan:    Hypertension complicating diabetes (HCC) Assessment & Plan: Well controlled.  No changes to medicines. Carvedilol 12.5 mg BID, entresto 49-51 mg BID, Torsemide 20 mg daily., aspirin 81 mg daily. Continue to work on eating a healthy diet and exercise.  Labs drawn today.     Orders: -     CBC with Differential/Platelet -     Comprehensive metabolic panel  Diabetic glomerulopathy (HCC) Assessment & Plan: Control: Fair Recommend check sugars fasting daily. Recommend check feet daily. Recommend annual eye exams. Medicines: continue Metformin 500 mg take 1 tablet twice daily. Jardiance 10 mg daily Continue to work on eating a healthy diet and exercise.       Stage 3b chronic kidney  disease [facility]) Assessment & Plan: Stable.   Mixed hyperlipidemia Assessment & Plan: Well controlled.  No changes to medicines.  Continue to work on eating a healthy diet and exercise.  Labs drawn today.    Orders: -     Lipid panel  Other orders -     Nitroglycerin; DISSOLVE ONE TABLET UNDER THE TONGUE EVERY 5 MINUTES AS NEEDED FOR CHEST PAIN.  DO NOT EXCEED A TOTAL OF 3 DOSES IN 15 MINUTES  Dispense: 25 tablet; Refill: 0     Meds ordered this encounter  Medications   nitroGLYCERIN (NITROSTAT) 0.4 MG SL tablet    Sig: DISSOLVE ONE TABLET UNDER THE TONGUE EVERY 5 MINUTES AS NEEDED FOR CHEST PAIN.  DO NOT EXCEED A TOTAL OF 3 DOSES IN 15 MINUTES    Dispense:  25 tablet    Refill:  0    Orders Placed This Encounter  Procedures   CBC with Differential/Platelet   Comprehensive metabolic panel   Lipid panel     Follow-up: Return in about 3 months (around 04/20/2023) for chronic fasting.   I,Marla I Leal-Borjas,acting as a scribe for Blane Ohara, MD.,have documented all relevant documentation on the behalf of Blane Ohara, MD,as directed by  Blane Ohara, MD while in the presence of Blane Ohara, MD.   An After Visit Summary was printed and given to the patient.  Blane Ohara,  MD [facility] [phone]

## 2023-01-17 NOTE — Assessment & Plan Note (Signed)
Stable

## 2023-01-17 NOTE — Assessment & Plan Note (Signed)
Control: Fair Recommend check sugars fasting daily. Recommend check feet daily. Recommend annual eye exams. Medicines: continue Metformin 500 mg take 1 tablet twice daily. Jardiance 10 mg daily Continue to work on eating a healthy diet and exercise.     

## 2023-01-17 NOTE — Progress Notes (Addendum)
Care Management & Coordination Services Pharmacy Team Reason for Encounter: Appointment Reminder  Contacted patient to confirm telephone appointment with Artelia Laroche, PharmD on 01/23/23 at 9:00 am.  Spoke with patient on 01/17/2023   Do you have any problems getting your medications? No  What is your top health concern you would like to discuss at your upcoming visit? Pt husband wants to discuss her Furosemide since her hospital visit on 11/11/22  Have you seen any other providers since your last visit with PCP? Yes, she sees her Cardiologist, Oncologist and Endocrinology    Chart Updates:  Recent office visits:  01/18/23 Blane Ohara MD. Seen for follow up. D/C Colchicine 0.6mg  and Torsemide 10mg .   Recent consult visits:  01/09/23 Orinda Kenner PA-C (Cardiology). Seen for follow up. No med changes.   12/25/22 Rennis Harding MD (Oncology). Seen for renal failure. D/C Digoxin, Gabapentin and Albuterol Inhaler.   12/07/22 Reather Littler MD (Endocrinology). Seen for follow up. Referral to Nutritionist. No med changes.   12/04/22 Berneice Gandy RN. Telephone encounter. Continue holding spironolactone. Have him hold her lasix and only give that when he notices her scale going up and/or worsening of shortness of breath   Hospital visits:  Medication Reconciliation was completed by comparing discharge summary, patient's EMR and Pharmacy list, and upon discussion with patient.  Admitted to the hospital on 11/11/22 due to SOB. Discharge date was 11/17/22. Discharged from [facility].    New?Medications Started at [facility] Discharge:?? -started: Furosemide 20 mg tablet 1 tablet (20 mg total) by mouth in the morning and 1 tablet (20 mg total) in the evening. SacubitriL-valsartan 24-26 mg per tablet 2 tablets by mouth 2 (two) times a day.  Medication Changes at Hospital Discharge: -Changed None  Medications Discontinued at Hospital Discharge: -Stopped   SacubitriL-valsartan 49-51 mg per tablet Entresto 97-103 mg per tablet  Sodium polystyrene sulfonate Powd  Torsemide 10 mg tablet   Medications that remain the same after Hospital Discharge:??  -All other medications will remain the same.    Medications: Outpatient Encounter Medications as of 01/17/2023  Medication Sig   aspirin 81 MG chewable tablet Chew 81 mg by mouth in the morning.    Blood Glucose Monitoring Suppl (ONETOUCH VERIO FLEX SYSTEM) w/Device KIT Check sugar once daily   Budeson-Glycopyrrol-Formoterol (BREZTRI AEROSPHERE) 160-9-4.8 MCG/ACT AERO Inhale 2 puffs into the lungs 2 (two) times daily.   carvedilol (COREG) 12.5 MG tablet Take 12.5 mg by mouth 2 (two) times daily with a meal.   cephALEXin (KEFLEX) 500 MG capsule Take 500 mg by mouth 2 (two) times daily.   Cholecalciferol (VITAMIN D-3) 1000 UNITS CAPS Take 1,000 Units by mouth daily.   colchicine 0.6 MG tablet Take 1 tablet (0.6 mg total) by mouth 2 (two) times daily.   empagliflozin (JARDIANCE) 10 MG TABS tablet Take 1 tablet by mouth once daily with breakfast   glucose blood (ONETOUCH VERIO) test strip USE 1 STRIP TO CHECK GLUCOSE ONCE DAILY   lovastatin (MEVACOR) 40 MG tablet Take 1 tablet (40 mg total) by mouth at bedtime.   Magnesium 250 MG TABS Take 1 tablet (250 mg total) by mouth in the morning and at bedtime.   metFORMIN (GLUCOPHAGE) 500 MG tablet TAKE 1 TABLET BY MOUTH TWICE DAILY WITH MORNING MEAL AND WITH EVENING MEAL   Multiple Vitamins-Minerals (CENTRUM SILVER ULTRA WOMENS PO) Take 1 tablet by mouth daily.   nitroGLYCERIN (NITROSTAT) 0.4 MG SL tablet DISSOLVE ONE TABLET UNDER THE TONGUE  EVERY 5 MINUTES AS NEEDED FOR CHEST PAIN.  DO NOT EXCEED A TOTAL OF 3 DOSES IN 15 MINUTES   OneTouch Delica Lancets 33G MISC USE 1  TO CHECK GLUCOSE ONCE DAILY *APPOINTMENT  REQUIRED  FOR  FUTURE  REFILLS   Polyethyl Glycol-Propyl Glycol (SYSTANE ULTRA) 0.4-0.3 % SOLN Place 1 drop into both eyes as needed (dry eyes).    sacubitril-valsartan (ENTRESTO) 49-51 MG Take 1 tablet by mouth 2 (two) times daily.   torsemide (DEMADEX) 20 MG tablet Take 0.5 tablets (10 mg total) by mouth daily.   venlafaxine XR (EFFEXOR-XR) 75 MG 24 hr capsule Take 1 capsule (75 mg total) by mouth daily with breakfast.   No facility-administered encounter medications on file as of 01/17/2023.    Recent Relevant Labs: Lab Results  Component Value Date/Time   HGBA1C 6.7 11/11/2022 12:00 AM   HGBA1C 7.4 05/26/2022 12:00 AM   MICROALBUR 4.0 (H) 03/25/2021 08:59 AM   MICROALBUR <0.7 03/23/2020 10:49 AM    Kidney Function Lab Results  Component Value Date/Time   CREATININE 1.14 (H) 12/25/2022 09:51 AM   CREATININE 1.39 (H) 10/13/2022 08:51 AM   CREATININE 1.67 (H) 10/09/2022 02:30 PM   GFR 32.91 (L) 03/23/2020 08:52 AM   GFRNONAA 49 (L) 12/25/2022 09:51 AM   GFRAA 69 09/08/2020 02:42 PM    Star Rating Drugs:  Medication:  Last Fill: Day Supply Jardiance   01/03/23-12/05/22 30ds Lovastatin   11/28/22-08/08/22 90ds Metformin   01/15/23-10/10/22  90ds Sacubitril-Valsartan 12/22/22-11/21/22  30ds  Care Gaps: Annual wellness visit in last year? Yes Last eye exam / retinopathy screening:Overdue since 01/12/22 Urine ACR: Overdue since 12/20/22   Roxana Hires, CMA Clinical Pharmacist Assistant  [phone]

## 2023-01-17 NOTE — Assessment & Plan Note (Signed)
Well controlled.  No changes to medicines. Carvedilol 12.5 mg BID, entresto 49-51 mg BID, Torsemide 20 mg daily., aspirin 81 mg daily. Continue to work on eating a healthy diet and exercise.  Labs drawn today.

## 2023-01-18 ENCOUNTER — Ambulatory Visit (INDEPENDENT_AMBULATORY_CARE_PROVIDER_SITE_OTHER): Payer: Medicare Other | Admitting: Family Medicine

## 2023-01-18 ENCOUNTER — Encounter: Payer: Self-pay | Admitting: Family Medicine

## 2023-01-18 VITALS — BP 118/58 | HR 85 | Temp 97.0°F | Ht 61.0 in | Wt 85.0 lb

## 2023-01-18 DIAGNOSIS — E1159 Type 2 diabetes mellitus with other circulatory complications: Secondary | ICD-10-CM

## 2023-01-18 DIAGNOSIS — N1832 Chronic kidney disease, stage 3b: Secondary | ICD-10-CM

## 2023-01-18 DIAGNOSIS — E782 Mixed hyperlipidemia: Secondary | ICD-10-CM

## 2023-01-18 DIAGNOSIS — I152 Hypertension secondary to endocrine disorders: Secondary | ICD-10-CM | POA: Diagnosis not present

## 2023-01-18 DIAGNOSIS — I110 Hypertensive heart disease with heart failure: Secondary | ICD-10-CM | POA: Diagnosis not present

## 2023-01-18 DIAGNOSIS — E1121 Type 2 diabetes mellitus with diabetic nephropathy: Secondary | ICD-10-CM | POA: Diagnosis not present

## 2023-01-18 DIAGNOSIS — I509 Heart failure, unspecified: Secondary | ICD-10-CM

## 2023-01-18 DIAGNOSIS — I10 Essential (primary) hypertension: Secondary | ICD-10-CM

## 2023-01-18 MED ORDER — NITROGLYCERIN 0.4 MG SL SUBL: SUBLINGUAL_TABLET | SUBLINGUAL | 0 refills | Status: DC

## 2023-01-19 LAB — COMPREHENSIVE METABOLIC PANEL
ALT: 29 IU/L (ref 0–32)
AST: 30 IU/L (ref 0–40)
Albumin/Globulin Ratio: 1.9 (ref 1.2–2.2)
Albumin: 3.8 g/dL (ref 3.8–4.8)
Alkaline Phosphatase: 100 IU/L (ref 44–121)
BUN/Creatinine Ratio: 34 — ABNORMAL HIGH (ref 12–28)
BUN: 42 mg/dL — ABNORMAL HIGH (ref 8–27)
Bilirubin Total: 0.4 mg/dL (ref 0.0–1.2)
CO2: 21 mmol/L (ref 20–29)
Calcium: 9.8 mg/dL (ref 8.7–10.3)
Chloride: 103 mmol/L (ref 96–106)
Creatinine, Ser: 1.25 mg/dL — ABNORMAL HIGH (ref 0.57–1.00)
Globulin, Total: 2 g/dL (ref 1.5–4.5)
Glucose: 125 mg/dL — ABNORMAL HIGH (ref 70–99)
Potassium: 5 mmol/L (ref 3.5–5.2)
Sodium: 139 mmol/L (ref 134–144)
Total Protein: 5.8 g/dL — ABNORMAL LOW (ref 6.0–8.5)
eGFR: 44 mL/min/{1.73_m2} — ABNORMAL LOW (ref >59)

## 2023-01-19 LAB — CBC WITH DIFFERENTIAL/PLATELET
Basophils Absolute: 0.1 10*3/uL (ref 0.0–0.2)
Basos: 1 %
EOS (ABSOLUTE): 0.1 10*3/uL (ref 0.0–0.4)
Eos: 2 %
Hematocrit: 39.8 % (ref 34.0–46.6)
Hemoglobin: 13.1 g/dL (ref 11.1–15.9)
Immature Grans (Abs): 0.1 10*3/uL (ref 0.0–0.1)
Immature Granulocytes: 1 %
Lymphocytes Absolute: 1.4 10*3/uL (ref 0.7–3.1)
Lymphs: 20 %
MCH: 30.8 pg (ref 26.6–33.0)
MCHC: 32.9 g/dL (ref 31.5–35.7)
MCV: 93 fL (ref 79–97)
Monocytes Absolute: 0.5 10*3/uL (ref 0.1–0.9)
Monocytes: 7 %
Neutrophils Absolute: 4.9 10*3/uL (ref 1.4–7.0)
Neutrophils: 69 %
Platelets: 176 10*3/uL (ref 150–450)
RBC: 4.26 x10E6/uL (ref 3.77–5.28)
RDW: 14 % (ref 11.7–15.4)
WBC: 7 10*3/uL (ref 3.4–10.8)

## 2023-01-19 LAB — LIPID PANEL
Chol/HDL Ratio: 2.1 ratio (ref 0.0–4.4)
Cholesterol, Total: 167 mg/dL (ref 100–199)
HDL: 80 mg/dL (ref >39)
LDL Chol Calc (NIH): 68 mg/dL (ref 0–99)
Triglycerides: 110 mg/dL (ref 0–149)
VLDL Cholesterol Cal: 19 mg/dL (ref 5–40)

## 2023-01-22 ENCOUNTER — Other Ambulatory Visit: Payer: Self-pay | Admitting: Endocrinology

## 2023-01-22 DIAGNOSIS — E119 Type 2 diabetes mellitus without complications: Secondary | ICD-10-CM

## 2023-01-23 ENCOUNTER — Ambulatory Visit: Payer: Medicare Other

## 2023-01-23 NOTE — Patient Outreach (Signed)
Care Management & Coordination Services Pharmacy Note  01/23/2023 Name:  Bridget [name] MRN:  [id number] DOB:  1940-10-18  Summary: -Very pleasant couple presents for visit/ -Husband was a truck driver in [place], drove a lot of ketchup from [place] to [place] -He likes gardening  Recommendations/Changes made from today's visit: -Patient due for urine albumin. Will co-sign PCP -Patient due for eye exam -Jardiance is expensive but hard to get approved via PAP. Sent msg to Endo asking to do Farxiga PAP instead -Entresto PAP   Subjective: Bridget [name] is an 82 y.o. year old female who is a primary patient of Cox, Kirsten, MD.  The care coordination team was consulted for assistance with disease management and care coordination needs.    Engaged with patient by telephone for follow up visit.  Recent office visits:  01/18/23 Blane Ohara MD. Seen for follow up. D/C Colchicine 0.6mg  and Torsemide 10mg .    Recent consult visits:  01/09/23 Orinda Kenner PA-C (Cardiology). Seen for follow up. No med changes.    12/25/22 Rennis Harding MD (Oncology). Seen for renal failure. D/C Digoxin, Gabapentin and Albuterol Inhaler.    12/07/22 Reather Littler MD (Endocrinology). Seen for follow up. Referral to Nutritionist. No med changes.    12/04/22 Berneice Gandy RN. Telephone encounter. Continue holding spironolactone. Have him hold her lasix and only give that when he notices her scale going up and/or worsening of shortness of breath    Hospital visits:  Medication Reconciliation was completed by comparing discharge summary, patient's EMR and Pharmacy list, and upon discussion with patient.   Admitted to the hospital on 11/11/22 due to SOB. Discharge date was 11/17/22. Discharged from [facility].     New?Medications Started at [facility] Discharge:?? -started: Furosemide 20 mg tablet 1 tablet (20 mg total) by mouth in the morning and 1 tablet (20 mg total) in the  evening. SacubitriL-valsartan 24-26 mg per tablet 2 tablets by mouth 2 (two) times a day.   Medication Changes at Hospital Discharge: -Changed None   Medications Discontinued at Hospital Discharge: -Stopped  SacubitriL-valsartan 49-51 mg per tablet Entresto 97-103 mg per tablet  Sodium polystyrene sulfonate Powd  Torsemide 10 mg tablet    Medications that remain the same after Hospital Discharge:??  -All other medications will remain the same.   Objective:  Lab Results  Component Value Date   CREATININE 1.25 (H) 01/18/2023   BUN 42 (H) 01/18/2023   GFR 32.91 (L) 03/23/2020   EGFR 44 (L) 01/18/2023   GFRNONAA 49 (L) 12/25/2022   GFRAA 69 09/08/2020   NA 139 01/18/2023   K 5.0 01/18/2023   CALCIUM 9.8 01/18/2023   CO2 21 01/18/2023   GLUCOSE 125 (H) 01/18/2023    Lab Results  Component Value Date/Time   HGBA1C 6.7 11/11/2022 12:00 AM   HGBA1C 7.4 05/26/2022 12:00 AM   GFR 32.91 (L) 03/23/2020 08:52 AM   GFR 54.56 (L) 01/02/2019 09:32 AM   MICROALBUR 4.0 (H) 03/25/2021 08:59 AM   MICROALBUR <0.7 03/23/2020 10:49 AM    Last diabetic Eye exam:  Lab Results  Component Value Date/Time   HMDIABEYEEXA No Retinopathy 01/12/2021 12:00 AM    Last diabetic Foot exam: No results found for: "HMDIABFOOTEX"   Lab Results  Component Value Date   CHOL 167 01/18/2023   HDL 80 01/18/2023   LDLCALC 68 01/18/2023   LDLDIRECT 94.2 12/05/2013   TRIG 110 01/18/2023   CHOLHDL 2.1 01/18/2023  Latest Ref Rng & Units 01/18/2023    8:25 AM 12/25/2022    9:51 AM 10/13/2022    8:51 AM  Hepatic Function  Total Protein 6.0 - 8.5 g/dL 5.8  6.7  5.9   Albumin 3.8 - 4.8 g/dL 3.8  3.6  4.1   AST 0 - 40 IU/L 30  34  34   ALT 0 - 32 IU/L 29  47  26   Alk Phosphatase 44 - 121 IU/L 100  107  134   Total Bilirubin 0.0 - 1.2 mg/dL 0.4  0.9  0.3     Lab Results  Component Value Date/Time   TSH 1.220 04/20/2022 08:56 AM   TSH 2.040 07/22/2021 10:05 AM   FREET4 1.28 03/23/2020 08:52 AM    FREET4 1.02 01/02/2019 09:32 AM       Latest Ref Rng & Units 01/18/2023    8:25 AM 12/25/2022   12:00 AM 10/09/2022    2:23 PM  CBC  WBC 3.4 - 10.8 x10E3/uL 7.0  7.1     6.5   Hemoglobin 11.1 - 15.9 g/dL 16.1  09.6     04.5   Hematocrit 34.0 - 46.6 % 39.8  40     35.4   Platelets 150 - 450 x10E3/uL 176  153     215      This result is from an external source.    Lab Results  Component Value Date/Time   VD25OH 38.9 04/20/2022 08:56 AM   VITAMINB12 565 09/22/2022 08:51 AM   VITAMINB12 693 07/22/2021 10:05 AM    Clinical ASCVD: Yes  The ASCVD Risk score (Arnett DK, et al., 2019) failed to calculate for the following reasons:   The patient has a prior MI or stroke diagnosis    Other: (CHADS2VASc if Afib, MMRC or CAT for COPD, ACT, DEXA)     01/18/2023    7:52 AM 11/01/2022    9:47 AM 10/04/2022    7:56 AM  Depression screen PHQ 2/9  Decreased Interest 0 0 0  Down, Depressed, Hopeless 1 0 1  PHQ - 2 Score 1 0 1  Altered sleeping 1  1  Tired, decreased energy 1  3  Change in appetite 1  1  Feeling bad or failure about yourself  0  1  Trouble concentrating 1  2  Moving slowly or fidgety/restless 0  0  Suicidal thoughts 0  0  PHQ-9 Score 5  9  Difficult doing work/chores Not difficult at all  Not difficult at all     Social History   Tobacco Use  Smoking Status Never   Passive exposure: Past  Smokeless Tobacco Never   BP Readings from Last 3 Encounters:  01/18/23 (!) 118/58  12/25/22 (!) 119/58  12/07/22 112/72   Pulse Readings from Last 3 Encounters:  01/18/23 85  12/25/22 92  12/07/22 68   Wt Readings from Last 3 Encounters:  01/18/23 85 lb (38.6 kg)  12/25/22 86 lb 11.2 oz (39.3 kg)  12/07/22 88 lb (39.9 kg)   BMI Readings from Last 3 Encounters:  01/18/23 16.06 kg/m  12/25/22 16.38 kg/m  12/07/22 16.63 kg/m    Allergies  Allergen Reactions   Atorvastatin Other (See Comments)    "muscle Cramps"   Budesonide Other (See Comments)    "fatigue"    Gabapentin Other (See Comments)    "fatigue"   Metoclopramide Other (See Comments)    "mayalgia"   Rosuvastatin Other (See Comments)    "  muscle cramps"   Simvastatin Other (See Comments)    "muscle Cramps"   Wellbutrin [Bupropion]     Gi upset.    Sulfamethoxazole-Trimethoprim Rash    Medications Reviewed Today     Reviewed by Horald Pollen, CMA (Certified Medical Assistant) on 01/18/23 at 0753  Med List Status: <None>   Medication Order Taking? Sig Documenting Provider Last Dose Status Informant  aspirin 81 MG chewable tablet [id number]  Chew 81 mg by mouth in the morning.  [provider]  Active Self  Blood Glucose Monitoring Suppl (ONETOUCH VERIO FLEX SYSTEM) w/Device KIT [id number]  Check sugar once daily Reather Littler, MD  Active Self  Budeson-Glycopyrrol-Formoterol (BREZTRI AEROSPHERE) 160-9-4.8 MCG/ACT Sandrea Matte [id number]  Inhale 2 puffs into the lungs 2 (two) times daily. Cox, Kirsten, MD  Active Self  carvedilol (COREG) 12.5 MG tablet [id number]  Take 12.5 mg by mouth 2 (two) times daily with a meal. [provider]  Active Self  cephALEXin (KEFLEX) 500 MG capsule [id number]  Take 500 mg by mouth 2 (two) times daily. [provider]  Active   Cholecalciferol (VITAMIN D-3) 1000 UNITS CAPS [id number]  Take 1,000 Units by mouth daily. [provider]  Active Self  colchicine 0.6 MG tablet [id number]  Take 1 tablet (0.6 mg total) by mouth 2 (two) times daily. Cox, Kirsten, MD  Active   empagliflozin (JARDIANCE) 10 MG TABS tablet [id number]  Take 1 tablet by mouth once daily with breakfast Reather Littler, MD  Active   glucose blood [facility] VERIO) test strip [id number]  USE 1 STRIP TO CHECK GLUCOSE ONCE DAILY Reather Littler, MD  Active   lovastatin (MEVACOR) 40 MG tablet [id number]  Take 1 tablet (40 mg total) by mouth at bedtime. Blane Ohara, MD  Active   Magnesium 250 MG TABS [id number]  Take 1 tablet (250 mg total) by mouth in the morning and at bedtime.  Cox, Kirsten, MD  Active Self  metFORMIN (GLUCOPHAGE) 500 MG tablet [id number]  TAKE 1 TABLET BY MOUTH TWICE DAILY WITH MORNING MEAL AND WITH EVENING MEAL Reather Littler, MD  Active   Multiple Vitamins-Minerals (CENTRUM SILVER ULTRA WOMENS PO) [id number]  Take 1 tablet by mouth daily. [provider]  Active Self  nitroGLYCERIN (NITROSTAT) 0.4 MG SL tablet [id number]  DISSOLVE ONE TABLET UNDER THE TONGUE EVERY 5 MINUTES AS NEEDED FOR CHEST PAIN.  DO NOT EXCEED A TOTAL OF 3 DOSES IN 15 MINUTES Cox, Kirsten, MD  Active Self  OneTouch Delica Lancets 33G MISC [id number]  USE 1  TO CHECK GLUCOSE ONCE DAILY *APPOINTMENT  REQUIRED  FOR  FUTURE  REFILLS Reather Littler, MD  Active Self  Polyethyl Glycol-Propyl Glycol (SYSTANE ULTRA) 0.4-0.3 % SOLN [id number]  Place 1 drop into both eyes as needed (dry eyes). [provider]  Active Self  sacubitril-valsartan (ENTRESTO) 49-51 MG [id number]  Take 1 tablet by mouth 2 (two) times daily. Cox, Kirsten, MD  Active   torsemide (DEMADEX) 20 MG tablet [id number]  Take 0.5 tablets (10 mg total) by mouth daily. Cox, Kirsten, MD  Active   venlafaxine XR (EFFEXOR-XR) 75 MG 24 hr capsule [id number]  Take 1 capsule (75 mg total) by mouth daily with breakfast. Cox, Kirsten, MD  Active             SDOH:  (Social Determinants of Health) assessments and interventions performed: Yes SDOH Interventions    Flowsheet Row Care Coordination from 01/23/2023 in [facility] Office Visit from 01/18/2023 in [facility] Office Visit from 09/22/2022 in [facility] Telephone from 06/08/2022 in [facility] Care Coordination Telephone from 05/08/2022 in [facility] Care Coordination Office Visit from 04/20/2022 in [facility]  SDOH Interventions        Food Insecurity Interventions -- -- -- Intervention Not Indicated Intervention Not Indicated Intervention Not Indicated   Housing Interventions -- -- -- -- -- Intervention Not Indicated  Transportation Interventions Intervention Not Indicated -- -- Intervention Not Indicated  [husband provides transportation] Intervention Not Indicated  [husband provides transportation] Intervention Not Indicated  Utilities Interventions -- -- -- -- -- Intervention Not Indicated  Alcohol Usage Interventions -- -- -- -- -- Intervention Not Indicated (Score <7)  Depression Interventions/Treatment  -- Currently on Treatment Currently on Treatment -- -- Counseling  Financial Strain Interventions Other (Comment) -- -- -- -- Intervention Not Indicated  [Simultaneous filing. User may not have seen previous data.]  Physical Activity Interventions -- -- -- -- -- Intervention Not Indicated  Stress Interventions -- -- -- -- -- Intervention Not Indicated  Social Connections Interventions -- -- -- -- -- Intervention Not Indicated       Medication Assistance: (See note)  Medication Access: Name and location of current pharmacy:  [facility] [address] [place] - [address] [place] [address] Phone: [phone] Fax: [phone]  [facility] [phone] - Rosalita Levan, [place] - [address] AT [facility] [address] [place] [address] Phone: [phone] Fax: [phone]  Compliance/Adherence/Medication fill history: Star Rating Drugs:  Medication:                Last Fill:         Day Supply Jardiance                    01/03/23-12/05/22           30ds Lovastatin                    11/28/22-08/08/22         90ds Metformin                    01/15/23-10/10/22             90ds Sacubitril-Valsartan     12/22/22-11/21/22             30ds   Care Gaps: Annual wellness visit in last year? Yes Last eye exam / retinopathy screening:Overdue since 01/12/22 Urine ACR: Overdue since 12/20/22   Assessment/Plan    Diabetes (A1c goal <7%) -Managed by Endo, Dr. Reather Littler Lab  Results  Component Value Date   HGBA1C 6.7 11/11/2022   HGBA1C 7.4 05/26/2022   HGBA1C 7.4 (H) 03/30/2022   Lab Results  Component Value Date   MICROALBUR 4.0 (H) 03/25/2021   LDLCALC 68 01/18/2023   CREATININE 1.25 (H) 01/18/2023    Lab Results  Component Value Date   NA 139 01/18/2023   K 5.0 01/18/2023   CREATININE 1.25 (H) 01/18/2023   GFR 32.91 (L) 03/23/2020   EGFR 44 (L) 01/18/2023   GFRNONAA 49 (L) 12/25/2022   GLUCOSE 125 (H) 01/18/2023    Lab Results  Component Value Date   WBC 7.0 01/18/2023   HGB 13.1 01/18/2023   HCT 39.8 01/18/2023   MCV 93 01/18/2023   PLT 176 01/18/2023  Lab Results  Component Value Date   LABMICR 11.3 12/19/2021   MICROALBUR 4.0 (H) 03/25/2021   MICROALBUR <0.7 03/23/2020  -Controlled -Current medications: metformin 500 mg morning and 500 mg evening meals Appropriate, Effective, Safe, Accessible Jardiance 10mg  Appropriate, Effective, Safe, Query accessible 2024: PAP Started June -Medications previously tried: pioglitazone, Metformin (Unable to tolerate max dose) -Current home glucose readings fasting glucose:  October 2023: 87, 121, 164, 128, 144  March 2023: 101/112/110 Sept 2023: 126,157,170,142,201,174,169,171 June 2024: 01/23/23: 110 01/22/23: 133 01/21/23: 111 01/20/23: 109 01/19/23: 120 post prandial glucose: not taking  -Denies hypoglycemic/hyperglycemic symptoms -Current meal patterns:  Patient reports healthy diet and mainly eats at home.  -Current exercise: walking  -Educated on A1c and blood sugar goals; Complications of diabetes including kidney damage, retinal damage, and cardiovascular disease; Exercise goal of 150 minutes per week; -Counseled to check feet daily and get yearly eye exams -Counseled on diet and exercise extensively Sept 2023: Sugars definitely increasing. Per patient, she's been eating a lot more cookies. Counseled on diet, exercise, and starting SGLT2(-). She wants to wait until next week when  she sees her DM doctor. Counseled extensively to call me after she sees them so I can do PAP June 2024: Sugars look great but [place] Pepper is expensive. Will ask Endo if we can change to [place] which is easier to approve via PAP    Heart Failure (Goal: manage symptoms and prevent exacerbations) -Controlled -Last ejection fraction: 20-25% (Date: 07/2020) -HF type: Diastolic -NYHA Class: III (marked limitation of activity) -Current treatment: Carvedilol 12.5 mg bid Appropriate, Effective, Safe, Accessible Furosemide 20 mg every other day unless swelling increases Appropriate, Effective, Safe, Accessible Entresto 49/51 Appropriate, Effective, Safe, Query accessible 2024: PAP Started June  Nitroglycerin 0.4 mg sl every 5 minutes prn chest pain Appropriate, Effective, Safe, Accessible -Medications previously tried: Spironolactone -Current home weight readings:  Wt Readings from Last 3 Encounters:  01/18/23 85 lb (38.6 kg)  12/25/22 86 lb 11.2 oz (39.3 kg)  12/07/22 88 lb (39.9 kg)  June 2024: 01/23/23: 86.6 01/22/23: 87.6 01/21/23: 87.6 01/20/23: 87.6 01/19/23: 88.0 01/18/23: 85.4 -Current home BP/HR readings:  June 2024: 01/23/23: 96/62, 92 01/22/23: 117/63, 81 01/21/23: 110/68, 92 01/20/23: 111/71, 93 Does have orthostasis -Current dietary habits: eating healthy diet. Meat and vegetables.  -Current exercise habits: walking more and working to increase stamina since pacemaker updated 09/27/2020.  -Educated on Benefits of medications for managing symptoms and prolonging life Importance of blood pressure control -Counseled on diet and exercise extensively June 2024: Spent time counseling about weight/leg edema for when to take extra furosemide -Entresto is expensive, PAP ASAP     Hyperlipidemia: (LDL goal < 55)The ASCVD Risk score (Arnett DK, et al., 2019) failed to calculate for the following reasons:   The patient has a prior MI or stroke diagnosis Lab Results  Component Value Date   CHOL  167 01/18/2023   CHOL 150 10/04/2022   CHOL 160 09/22/2022   Lab Results  Component Value Date   HDL 80 01/18/2023   HDL 85 10/04/2022   HDL 85 09/22/2022   Lab Results  Component Value Date   LDLCALC 68 01/18/2023   LDLCALC 45 10/04/2022   LDLCALC 52 09/22/2022   Lab Results  Component Value Date   TRIG 110 01/18/2023   TRIG 119 10/04/2022   TRIG 135 09/22/2022   Lab Results  Component Value Date   CHOLHDL 2.1 01/18/2023   CHOLHDL 1.8 10/04/2022   CHOLHDL 1.9 09/22/2022  Lab Results  Component Value Date   LDLDIRECT 94.2 12/05/2013   Last vitamin D Lab Results  Component Value Date   VD25OH 38.9 04/20/2022   Lab Results  Component Value Date   TSH 1.220 04/20/2022  -Uncontrolled -Current treatment: Aspirin 81 mg daily  Appropriate, Effective, Safe, Accessible Lovastatin 40mg  Appropriate, Effective, Safe, Accessible -Medications previously tried: atorvastatin, rosuvastatin, simvastatin, fluvastatin, Lovastatin -Current dietary patterns: overall healthy diet.  -Current exercise habits: walking more and working to rebuild stamina.  -Educated on Cholesterol goals;  Benefits of statin for ASCVD risk reduction; Importance of limiting foods high in cholesterol; Exercise goal of 150 minutes per week; -Counseled on diet and exercise extensively Sept 2023: Patient states she always has issues on statins. Will ask PCP for Vit D lab (Patient also states she's tired)     COPD Pulmonary Functions Testing Results:  Pulmonary Functions Testing Results:  No results found for: "FEV1", "FVC", "FEV1FVC", "TLC", "DLCO" -Controlled -Current treatment: Breztri Appropriate, Effective, Safe, Accessible 2023: PAP Approved October 2022: PAP due to be renewed 07/06/21, will complete June 2024: Patient states she hasn't used in a while     Depression/Anxiety -Controlled -Current treatment: Venlafaxine XR 75mg  QD Appropriate, Effective, Safe, Accessible -Medications  previously tried/failed: Trintellix (Didn't help), Wellbutrin (N/V), and Vraylar (Cost) -PHQ9:     01/18/2023    7:52 AM 11/01/2022    9:47 AM 10/04/2022    7:56 AM  Depression screen PHQ 2/9  Decreased Interest 0 0 0  Down, Depressed, Hopeless 1 0 1  PHQ - 2 Score 1 0 1  Altered sleeping 1  1  Tired, decreased energy 1  3  Change in appetite 1  1  Feeling bad or failure about yourself  0  1  Trouble concentrating 1  2  Moving slowly or fidgety/restless 0  0  Suicidal thoughts 0  0  PHQ-9 Score 5  9  Difficult doing work/chores Not difficult at all  Not difficult at all  -GAD7:     01/18/2023    7:52 AM 08/18/2021   11:35 AM 06/30/2021    2:43 PM  GAD 7 : Generalized Anxiety Score  Nervous, Anxious, on Edge 1 3 1   Control/stop worrying 1 2 3   Worry too much - different things 1 2 1   Trouble relaxing 1 3 1   Restless 1 3 3   Easily annoyed or irritable 0 1 1  Afraid - awful might happen 0 2 0  Total GAD 7 Score 5 16 10   Anxiety Difficulty Not difficult at all Somewhat difficult    -Educated on Benefits of medication for symptom control Sept 2023: Recommend increasing to 150mg . PHQ9 still elevated and when I asked her, "How's your mood" her and her husband laughed in a way that made it seem "not good." June 2024: Mainly conducted visit with patient and husband present so didn't do PHQ9  CP F/U November 2024  Artelia Laroche, Pharm.D. - [phone]

## 2023-01-31 ENCOUNTER — Other Ambulatory Visit: Payer: Self-pay

## 2023-01-31 ENCOUNTER — Other Ambulatory Visit: Payer: Self-pay | Admitting: Endocrinology

## 2023-01-31 DIAGNOSIS — F330 Major depressive disorder, recurrent, mild: Secondary | ICD-10-CM

## 2023-01-31 MED ORDER — MIRTAZAPINE 15 MG PO TABS: 15.0000 mg | ORAL_TABLET | Freq: Every day | ORAL | 1 refills | Status: DC

## 2023-01-31 MED ORDER — VENLAFAXINE HCL ER 75 MG PO CP24
150.0000 mg | ORAL_CAPSULE | Freq: Every day | ORAL | 0 refills | Status: DC
Start: 2023-01-31 — End: 2023-05-01

## 2023-01-31 NOTE — Telephone Encounter (Signed)
Bridget [name] comes in with concerns that Bridget [name] is more depressed. She is not eating well and she seems down.  Endocrinology is concerned about her weight loss and have referred her to Endocrinology.  Dr. Sedalia Muta advised that her venlafaxine be increased to  daily.  She also added mirtazapine 15 mg at bedtime to increase her appetite.

## 2023-02-07 DIAGNOSIS — I5020 Unspecified systolic (congestive) heart failure: Secondary | ICD-10-CM | POA: Diagnosis not present

## 2023-02-07 DIAGNOSIS — I340 Nonrheumatic mitral (valve) insufficiency: Secondary | ICD-10-CM | POA: Diagnosis not present

## 2023-02-07 DIAGNOSIS — I110 Hypertensive heart disease with heart failure: Secondary | ICD-10-CM | POA: Diagnosis not present

## 2023-02-07 DIAGNOSIS — Z45018 Encounter for adjustment and management of other part of cardiac pacemaker: Secondary | ICD-10-CM | POA: Diagnosis not present

## 2023-02-07 DIAGNOSIS — I351 Nonrheumatic aortic (valve) insufficiency: Secondary | ICD-10-CM | POA: Diagnosis not present

## 2023-02-07 DIAGNOSIS — I442 Atrioventricular block, complete: Secondary | ICD-10-CM | POA: Diagnosis not present

## 2023-02-07 DIAGNOSIS — I519 Heart disease, unspecified: Secondary | ICD-10-CM | POA: Diagnosis not present

## 2023-02-07 DIAGNOSIS — Z7901 Long term (current) use of anticoagulants: Secondary | ICD-10-CM | POA: Diagnosis not present

## 2023-02-07 DIAGNOSIS — R0609 Other forms of dyspnea: Secondary | ICD-10-CM | POA: Diagnosis not present

## 2023-02-20 DIAGNOSIS — Z792 Long term (current) use of antibiotics: Secondary | ICD-10-CM | POA: Diagnosis not present

## 2023-02-20 DIAGNOSIS — B9561 Methicillin susceptible Staphylococcus aureus infection as the cause of diseases classified elsewhere: Secondary | ICD-10-CM | POA: Diagnosis not present

## 2023-02-20 DIAGNOSIS — Z950 Presence of cardiac pacemaker: Secondary | ICD-10-CM | POA: Diagnosis not present

## 2023-02-20 DIAGNOSIS — R7881 Bacteremia: Secondary | ICD-10-CM | POA: Diagnosis not present

## 2023-02-26 ENCOUNTER — Other Ambulatory Visit: Payer: Self-pay | Admitting: Family Medicine

## 2023-03-06 ENCOUNTER — Other Ambulatory Visit: Payer: Self-pay | Admitting: Endocrinology

## 2023-03-09 ENCOUNTER — Encounter: Payer: Medicare Other | Attending: Endocrinology | Admitting: Dietician

## 2023-03-09 ENCOUNTER — Encounter: Payer: Self-pay | Admitting: Dietician

## 2023-03-09 VITALS — Ht 61.0 in | Wt 87.0 lb

## 2023-03-09 DIAGNOSIS — E118 Type 2 diabetes mellitus with unspecified complications: Secondary | ICD-10-CM | POA: Insufficient documentation

## 2023-03-09 DIAGNOSIS — I5020 Unspecified systolic (congestive) heart failure: Secondary | ICD-10-CM | POA: Diagnosis not present

## 2023-03-09 DIAGNOSIS — N1832 Chronic kidney disease, stage 3b: Secondary | ICD-10-CM | POA: Diagnosis not present

## 2023-03-09 NOTE — Progress Notes (Signed)
  Medical Nutrition Therapy  Appointment Start time:  [phone]  Appointment End time:  58 Patient is here today with her husband.  Primary concerns today: Patient needs to gain weight and follow a low sodium diet.  Referral diagnosis: Type 2 Diabetes, weight loss, CHF Preferred learning no preference indicated Learning readiness: ready   NUTRITION ASSESSMENT  She states that she lost a lot of weight after hospitalization for CHF this spring 2024. Fasting blood glucose 86-109. Her husband has brought a log of her weight, blood pressure and blood sugar.  Anthropometrics  61" 87 lbs 03/09/2023 111 lbs 07/2023    Clinical Medical Hx: Type 2 Diabetes, CHF, HTN, HLD, CKD, Clotting disorder, osteoporosis, anxiety/depression, , COPD,  GERD Medications: Jardiance, Metformin, Lasix, Magnesium, MVI, vitamin D Labs: BUN 42, Creatine 1.25, Potassium 5, eGFR 44 on 01/18/2023, A1C 6.6% 11/11/2022 decreased from 7.4% 05/26/2022 Notable Signs/Symptoms: low weight  Lifestyle & Dietary Hx Patient lives with her husband.  He shops and they share cooking. She is retired from a Patent attorney. Follows a low sodium diet.  Uses "No Salt".  Reads labels.    Supplements: Glucerna, Magnesium, vitamin D, MVI Sleep: sleeps frequently during the day and not as well at night Current average weekly physical activity: ADL's  24-Hr Dietary Recall First Meal: Honey nut and oats, whole milk, 8 oz grape juice, 1/2 Glucerna Snack: small honey bun or cookie Second Meal: none (was asleep) Snack: NABS  Third Meal: rotisserie chicken, potato salad (homemade without salt) Snack: none Beverages: water, whole milk, zero sugar gingerale, occasional sweet tea, juice, 1 Glucerna most days  Estimated Energy Needs Calories: 1300 Protein: 40g   NUTRITION DIAGNOSIS  NB-1.1 Food and nutrition-related knowledge deficit As related to balance of protein, carbohydrates, and fat.  As evidenced by diet hx and patient  report.   NUTRITION INTERVENTION  Nutrition education (E-1) on the following topics:  Low sodium diet Use of salt substitutes that contain potassium are not recommended.  Your potassium has been slightly elevated. Meal and food options that are low in sodium Tips to increase calories  Handouts Provided Include  Heart Healthy Consistent Carbohydrate Nutrition Therapy from AND Snack list Label reading  Learning Style & Readiness for Change Teaching method utilized: Visual & Auditory  Demonstrated degree of understanding via: Teach Back  Barriers to learning/adherence to lifestyle change: decreased appetite, increased daytime sleepiness  Plan: You are using "No Salt".  This is a potassium containing salt and generally is not recommended.  Discuss with your doctor.  Your potassium has been slightly elevated at times.  Drink 1 Glucerna daily.  Low sodium options: Silver Palate low sodium spaghetti sauce. [employer] salsa    Aim to eat 3 meals and 3 snacks daily. Add olive oil to your foods to add calories Choose whole milk Avoid skipping meals Continue a low sodium diet.  Homemade food is best rather than processed.  Ask for food at restaurants to be prepared without salt  MONITORING & EVALUATION Dietary intake, weekly physical activity, and label reading prn.  Next Steps  Patient is to call for questions.

## 2023-03-09 NOTE — Patient Instructions (Addendum)
You are using "No Salt".  This is a potassium containing salt and generally is not recommended.  Discuss with your doctor.  Your potassium has been slightly elevated at times.  Drink 1 Glucerna daily.  Low sodium options: Silver Palate low sodium spaghetti sauce. [employer] salsa    Aim to eat 3 meals and 3 snacks daily. Add olive oil to your foods to add calories Choose whole milk Avoid skipping meals Continue a low sodium diet.  Homemade food is best rather than processed.  Ask for food at restaurants to be prepared without salt

## 2023-03-13 ENCOUNTER — Ambulatory Visit (INDEPENDENT_AMBULATORY_CARE_PROVIDER_SITE_OTHER): Payer: Medicare Other | Admitting: Endocrinology

## 2023-03-13 ENCOUNTER — Encounter: Payer: Self-pay | Admitting: Endocrinology

## 2023-03-13 VITALS — BP 118/68 | HR 95 | Ht 61.0 in | Wt 88.0 lb

## 2023-03-13 DIAGNOSIS — E1140 Type 2 diabetes mellitus with diabetic neuropathy, unspecified: Secondary | ICD-10-CM | POA: Diagnosis not present

## 2023-03-13 DIAGNOSIS — Z7984 Long term (current) use of oral hypoglycemic drugs: Secondary | ICD-10-CM | POA: Diagnosis not present

## 2023-03-13 DIAGNOSIS — E119 Type 2 diabetes mellitus without complications: Secondary | ICD-10-CM

## 2023-03-13 DIAGNOSIS — E041 Nontoxic single thyroid nodule: Secondary | ICD-10-CM | POA: Diagnosis not present

## 2023-03-13 LAB — POCT GLYCOSYLATED HEMOGLOBIN (HGB A1C): Hemoglobin A1C: 6.4 % — AB (ref 4.0–5.6)

## 2023-03-13 LAB — MICROALBUMIN / CREATININE URINE RATIO
Creatinine,U: 67.8 mg/dL
Microalb Creat Ratio: 1.2 mg/g (ref 0.0–30.0)
Microalb, Ur: 0.8 mg/dL (ref 0.0–1.9)

## 2023-03-13 NOTE — Patient Instructions (Signed)
Check blood sugars on waking up 3 days a week  Also check blood sugars about 2 hours after meals and do this after different meals by rotation  Recommended blood sugar levels on waking up are 90-130 and about 2 hours after meal is 130-160  Please bring your blood sugar monitor to each visit, thank you   

## 2023-03-13 NOTE — Progress Notes (Signed)
Patient ID: Bridget [name], female   DOB: Oct 31, 1940, 82 y.o.   MRN: [id number]   Reason for Appointment: follow-up   History of Present Illness   Diagnosis: Type 2 DIABETES MELITUS, date of diagnosis:  2007     Previous history: Her A1c at diagnosis was 7.9% She had been treated with a combination of metformin ER and Actos for several years Usually her A1c has been upper normal Active is discontinued when she started having CHF  Recent history:  Her A1c is 6.4 today compared to previous 6.7 done at admission in March  Oral hypoglycemic drugs:   metformin  500 mg, twice daily, Jardiance 10 mg daily  She has continued Jardiance with the metformin at the same doses As before checking blood sugars fasting and forgets to check them after meals despite instructions After her last visit in April she was supposed to see the dietitian but she only made the appointment for last week She is trying to cut back on sweets and using artificial sweeteners Also now starting to get some protein shakes Has not gained back any weight Her blood sugars look better with relatively lower fasting readings compared to last time Appetite is still poor She cannot exercise with walking because of weakness and fatigue Recent GFR elsewhere 150              Side effects from medications:  Abdominal discomfort from high-dose metformin       Monitors blood glucose:  once a day or less.    Glucometer: One Touch Ultra.           Blood Glucose readings from download show average 97 with  RANGE range 79-120 Only 1 reading midday over 87   Previously:   PRE-MEAL Fasting Lunch Dinner Bedtime Overall  Glucose range: 98-187      Mean/median:     131   POST-MEAL PC Breakfast PC Lunch PC Dinner  Glucose range:   ?  Mean/median:       PRE-MEAL Fasting Lunch Dinner Bedtime Overall  Glucose range: 82-156   ?   Mean/median:     109    Dietician visit: Most recent: 02/2023   Wt Readings  from Last 3 Encounters:  03/13/23 88 lb (39.9 kg)  03/09/23 87 lb (39.5 kg)  01/18/23 85 lb (38.6 kg)   Lab Results  Component Value Date   HGBA1C 6.4 (A) 03/13/2023   HGBA1C 6.7 11/11/2022   HGBA1C 7.4 05/26/2022   Lab Results  Component Value Date   MICROALBUR 4.0 (H) 03/25/2021   LDLCALC 68 01/18/2023   CREATININE 1.25 (H) 01/18/2023   Lab Results  Component Value Date   CREATININE 1.25 (H) 01/18/2023   CREATININE 1.14 (H) 12/25/2022   CREATININE 1.39 (H) 10/13/2022   Lab Results  Component Value Date   HGB 13.1 01/18/2023    Other active problems discussed: See review of systems   Allergies as of 03/13/2023       Reactions   Atorvastatin Other (See Comments)   "muscle Cramps"   Budesonide Other (See Comments)   "fatigue"   Gabapentin Other (See Comments)   "fatigue"   Metoclopramide Other (See Comments)   "mayalgia"   Rosuvastatin Other (See Comments)   "muscle cramps"   Simvastatin Other (See Comments)   "muscle Cramps"   Wellbutrin [bupropion]    Gi upset.    Sulfamethoxazole-trimethoprim Rash        Medication List  Accurate as of March 13, 2023  8:19 AM. If you have any questions, ask your nurse or doctor.          aspirin 81 MG chewable tablet [name] 81 mg by mouth in the morning.   Breztri Aerosphere 160-9-4.8 MCG/ACT Aero Generic drug: Budeson-Glycopyrrol-Formoterol Inhale 2 puffs into the lungs 2 (two) times daily.   carvedilol 12.5 MG tablet Commonly known as: COREG Take 12.5 mg by mouth 2 (two) times daily with a meal.   CENTRUM SILVER ULTRA WOMENS PO Take 1 tablet by mouth daily.   cephALEXin 500 MG capsule Commonly known as: KEFLEX Take 500 mg by mouth 2 (two) times daily.   Entresto 49-51 MG Generic drug: sacubitril-valsartan Take 1 tablet by mouth 2 (two) times daily.   furosemide 20 MG tablet Commonly known as: LASIX Take 20 mg by mouth daily.   Jardiance 10 MG Tabs tablet Generic drug: empagliflozin Take  1 tablet by mouth once daily with breakfast   lovastatin 40 MG tablet Commonly known as: MEVACOR TAKE 1 TABLET BY MOUTH AT BEDTIME   Magnesium 250 MG Tabs Take 1 tablet (250 mg total) by mouth in the morning and at bedtime.   metFORMIN 500 MG tablet Commonly known as: GLUCOPHAGE TAKE 1 TABLET BY MOUTH TWICE DAILY WITH MORNING MEAL AND WITH EVENING MEAL   mirtazapine 15 MG tablet Commonly known as: REMERON Take 1 tablet (15 mg total) by mouth at bedtime.   nitroGLYCERIN 0.4 MG SL tablet Commonly known as: NITROSTAT DISSOLVE ONE TABLET UNDER THE TONGUE EVERY 5 MINUTES AS NEEDED FOR CHEST PAIN.  DO NOT EXCEED A TOTAL OF 3 DOSES IN 15 MINUTES   OneTouch Delica Plus Lancet33G Misc USE 1  TO CHECK GLUCOSE ONCE DAILY   OneTouch Verio Flex System w/Device Kit Check sugar once daily   OneTouch Verio test strip Generic drug: glucose blood USE 1 STRIP TO CHECK GLUCOSE ONCE DAILY   Systane Ultra 0.4-0.3 % Soln Generic drug: Polyethyl Glycol-Propyl Glycol Place 1 drop into both eyes as needed (dry eyes).   venlafaxine XR 75 MG 24 hr capsule Commonly known as: EFFEXOR-XR Take 2 capsules (150 mg total) by mouth daily with breakfast.   Vitamin D-3 25 MCG (1000 UT) Caps Take 1,000 Units by mouth daily.        Allergies:  Allergies  Allergen Reactions   Atorvastatin Other (See Comments)    "muscle Cramps"   Budesonide Other (See Comments)    "fatigue"   Gabapentin Other (See Comments)    "fatigue"   Metoclopramide Other (See Comments)    "mayalgia"   Rosuvastatin Other (See Comments)    "muscle cramps"   Simvastatin Other (See Comments)    "muscle Cramps"   Wellbutrin [Bupropion]     Gi upset.    Sulfamethoxazole-Trimethoprim Rash    Past Medical History:  Diagnosis Date   Anemia    Anxiety and depression    Arthritis    HANDS,ARMS,FEET   Asthma    Atrioventricular block    CAD (coronary artery disease)    CHF (congestive heart failure) (HCC)    Chronic renal  insufficiency    Clotting disorder (HCC)    Complication of anesthesia    Difficulty waking up once.   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 Otherwise normal colonoscopy   ESOPHAGOGASTRODUODENOSCOPY  11/16/2005   [facility]. Patchy areas of mucosal erythema in the antrum compatible with gastritis. Polyps in the stomach. Nodule in the antrum. Medium hiatal hernia   ESOPHAGOGASTRODUODENOSCOPY (EGD) WITH PROPOFOL N/A 04/13/2022   Procedure: ESOPHAGOGASTRODUODENOSCOPY (EGD) WITH PROPOFOL;  Surgeon: Lynann Bologna, MD;  Location: [facility];  Service: Gastroenterology;  Laterality: N/A;   MALONEY DILATION  04/13/2022   Procedure: Elease Hashimoto DILATION;  Surgeon: Lynann Bologna, MD;  Location: [facility];  Service: Gastroenterology;;   pacemaker  09/27/2016   PACEMAKER REVISION  09/27/2020   PACEMAKER UPGRADE DUAL CHAMBER TO BIV    Family History  Problem Relation Age of Onset   High blood pressure  Mother    Diabetes Mother        no medication needed   Kidney disease Mother    Pneumonia Mother    Colon polyps Father    High blood pressure Father    Colon cancer Father    Cancer Maternal Grandmother        Leukemia   Cancer Other        Ovarian   Rectal cancer Neg Hx    Stomach cancer Neg Hx    Esophageal cancer Neg Hx    Liver cancer Neg Hx    Pancreatic cancer Neg Hx    Crohn's disease Neg Hx     Social History:  reports that she has never smoked. She has been exposed to tobacco smoke. She has never used smokeless tobacco. She reports that she does not drink alcohol and does not use drugs.  Review of Systems:   Hypertension: This is mild, blood pressure is treated with Coreg, and, treated by other physicians Also on Entresto, blood pressure checked at home almost daily and usually in the low normal range  She has a history of CAD followed by cardiologist  Lipids: She has had statin intolerance with at least atorvastatin, simvastatin and rosuvastatin She was prescribed lovastatin by her PCP, LDL is last improved  Lab Results  Component Value Date   CHOL 167 01/18/2023   CHOL 150 10/04/2022   CHOL 160 09/22/2022   Lab Results  Component Value Date   HDL 80 01/18/2023   HDL 85 10/04/2022   HDL 85 09/22/2022   Lab Results  Component Value Date   LDLCALC 68 01/18/2023   LDLCALC 45 10/04/2022   LDLCALC 52 09/22/2022   Lab Results  Component Value Date   TRIG 110 01/18/2023  TRIG 119 10/04/2022   TRIG 135 09/22/2022   Lab Results  Component Value Date   CHOLHDL 2.1 01/18/2023   CHOLHDL 1.8 10/04/2022   CHOLHDL 1.9 09/22/2022   Lab Results  Component Value Date   LDLDIRECT 94.2 12/05/2013     THYROID:  She was previously on thyroid suppression because of her long-standing benign thyroid nodule.  She has had 2 biopsies done on this previously Because of low normal TSH her 50 mcg Synthroid was stopped in 10/14 TSH has been consistently normal  subsequently  She had a small left-sided thyroid nodule palpated on her visit in 12/20 and right thyroid nodule is relatively small  Her ultrasound last showed mostly cystic nodule in the right side about 1.6 cm  Lab Results  Component Value Date   TSH 1.220 04/20/2022   TSH 2.040 07/22/2021   TSH 1.64 03/23/2020       Examination:   BP 118/68   Pulse 95   Ht 5\' 1"  (1.549 m)   Wt 88 lb (39.9 kg)   SpO2 92%   BMI 16.63 kg/m   Body mass index is 16.63 kg/m.   1 cm nodule felt on the right medial thyroid lobe, left nodule not felt No pedal edema   ASSESSMENT/ PLAN:   Diabetes type 2 on metformin 500 mg twice a day and Jardiance 10 mg  A1c is 6.4  Blood sugars are excellent at home but checking only fasting Doing well with Jardiance with no worsening of renal function No blood sugar checks done after meals which would help her modify her diet further As before she does need to try and gain some weight with increased caloric intake with healthy nutrients She will need to follow the instructions from the nutritionist who she had a consultation with last week  Renal function adequate for her metformin therapy However needs updated urine microalbumin, not apparently done by the nephrologist last month  History of thyroid nodules: These are very small and stable and just barely palpable on the right She can have her PCP or cardiologist draw her TSH on the next lab work   There are no Patient Instructions on file for this visit.   Reather Littler 03/13/2023, 8:19 AM

## 2023-04-03 ENCOUNTER — Other Ambulatory Visit: Payer: Self-pay | Admitting: Endocrinology

## 2023-04-05 DIAGNOSIS — I4719 Other supraventricular tachycardia: Secondary | ICD-10-CM | POA: Diagnosis not present

## 2023-04-05 DIAGNOSIS — Z45018 Encounter for adjustment and management of other part of cardiac pacemaker: Secondary | ICD-10-CM | POA: Diagnosis not present

## 2023-04-09 DIAGNOSIS — R0609 Other forms of dyspnea: Secondary | ICD-10-CM | POA: Diagnosis not present

## 2023-04-09 DIAGNOSIS — I442 Atrioventricular block, complete: Secondary | ICD-10-CM | POA: Diagnosis not present

## 2023-04-09 DIAGNOSIS — N1832 Chronic kidney disease, stage 3b: Secondary | ICD-10-CM | POA: Diagnosis not present

## 2023-04-09 DIAGNOSIS — Z7901 Long term (current) use of anticoagulants: Secondary | ICD-10-CM | POA: Diagnosis not present

## 2023-04-09 DIAGNOSIS — I351 Nonrheumatic aortic (valve) insufficiency: Secondary | ICD-10-CM | POA: Diagnosis not present

## 2023-04-09 DIAGNOSIS — Z950 Presence of cardiac pacemaker: Secondary | ICD-10-CM | POA: Diagnosis not present

## 2023-04-09 DIAGNOSIS — I340 Nonrheumatic mitral (valve) insufficiency: Secondary | ICD-10-CM | POA: Diagnosis not present

## 2023-04-09 DIAGNOSIS — I5020 Unspecified systolic (congestive) heart failure: Secondary | ICD-10-CM | POA: Diagnosis not present

## 2023-04-09 DIAGNOSIS — I4719 Other supraventricular tachycardia: Secondary | ICD-10-CM | POA: Diagnosis not present

## 2023-04-09 DIAGNOSIS — I130 Hypertensive heart and chronic kidney disease with heart failure and stage 1 through stage 4 chronic kidney disease, or unspecified chronic kidney disease: Secondary | ICD-10-CM | POA: Diagnosis not present

## 2023-04-13 ENCOUNTER — Other Ambulatory Visit: Payer: Self-pay | Admitting: Endocrinology

## 2023-04-13 DIAGNOSIS — E119 Type 2 diabetes mellitus without complications: Secondary | ICD-10-CM

## 2023-04-19 DIAGNOSIS — I5023 Acute on chronic systolic (congestive) heart failure: Secondary | ICD-10-CM | POA: Diagnosis not present

## 2023-04-19 DIAGNOSIS — N1832 Chronic kidney disease, stage 3b: Secondary | ICD-10-CM | POA: Diagnosis not present

## 2023-04-19 DIAGNOSIS — I129 Hypertensive chronic kidney disease with stage 1 through stage 4 chronic kidney disease, or unspecified chronic kidney disease: Secondary | ICD-10-CM | POA: Diagnosis not present

## 2023-04-19 DIAGNOSIS — N183 Chronic kidney disease, stage 3 unspecified: Secondary | ICD-10-CM | POA: Diagnosis not present

## 2023-04-22 NOTE — Progress Notes (Unsigned)
Subjective:   Bridget [name] is a 82 y.o. female who presents for Medicare Annual (Subsequent) preventive examination.  Visit Complete: {VISITMETHOD:[id number]}  Patient Medicare AWV questionnaire was completed by the patient on ***; I have confirmed that all information answered by patient is correct and no changes since this date.  Review of Systems    ***       Objective:    There were no vitals filed for this visit. There is no height or weight on file to calculate BMI.     03/09/2023    9:17 AM 10/05/2022    9:16 AM 09/28/2022    9:14 AM 09/25/2022   11:30 AM 04/20/2022    8:05 AM 04/13/2022   10:04 AM 10/13/2021   10:42 AM  Advanced Directives  Does Patient Have a Medical Advance Directive? Yes No No No Yes Yes No  Type of Merchandiser, retail of [place];Living will Healthcare Power of [place];Living will   Does patient want to make changes to medical advance directive? No - Patient declined        Copy of Healthcare Power of Attorney in Chart?     No - copy requested No - copy requested   Would patient like information on creating a medical advance directive?  No - Patient declined No - Patient declined        Current Medications (verified) Outpatient Encounter Medications as of 04/23/2023  Medication Sig   aspirin 81 MG chewable tablet Chew 81 mg by mouth in the morning.    Blood Glucose Monitoring Suppl (ONETOUCH VERIO FLEX SYSTEM) w/Device KIT Check sugar once daily   Budeson-Glycopyrrol-Formoterol (BREZTRI AEROSPHERE) 160-9-4.8 MCG/ACT AERO Inhale 2 puffs into the lungs 2 (two) times daily.   carvedilol (COREG) 12.5 MG tablet Take 12.5 mg by mouth 2 (two) times daily with a meal.   cephALEXin (KEFLEX) 500 MG capsule Take 500 mg by mouth 2 (two) times daily.   Cholecalciferol (VITAMIN D-3) 1000 UNITS CAPS Take 1,000 Units by mouth daily.   empagliflozin (JARDIANCE) 10 MG TABS tablet Take 1 tablet by mouth once daily with breakfast   furosemide  (LASIX) 20 MG tablet Take 20 mg by mouth daily.   glucose blood (ONETOUCH VERIO) test strip USE 1 STRIP TO CHECK GLUCOSE ONCE DAILY   Lancets (ONETOUCH DELICA PLUS LANCET33G) MISC USE 1  TO CHECK GLUCOSE ONCE DAILY   lovastatin (MEVACOR) 40 MG tablet TAKE 1 TABLET BY MOUTH AT BEDTIME   Magnesium 250 MG TABS Take 1 tablet (250 mg total) by mouth in the morning and at bedtime.   metFORMIN (GLUCOPHAGE) 500 MG tablet TAKE 1 TABLET BY MOUTH TWICE DAILY WITH MORNING MEAL AND WITH EVENING MEAL   mirtazapine (REMERON) 15 MG tablet Take 1 tablet (15 mg total) by mouth at bedtime.   Multiple Vitamins-Minerals (CENTRUM SILVER ULTRA WOMENS PO) Take 1 tablet by mouth daily.   nitroGLYCERIN (NITROSTAT) 0.4 MG SL tablet DISSOLVE ONE TABLET UNDER THE TONGUE EVERY 5 MINUTES AS NEEDED FOR CHEST PAIN.  DO NOT EXCEED A TOTAL OF 3 DOSES IN 15 MINUTES   Polyethyl Glycol-Propyl Glycol (SYSTANE ULTRA) 0.4-0.3 % SOLN Place 1 drop into both eyes as needed (dry eyes).   sacubitril-valsartan (ENTRESTO) 49-51 MG Take 1 tablet by mouth 2 (two) times daily.   venlafaxine XR (EFFEXOR-XR) 75 MG 24 hr capsule Take 2 capsules (150 mg total) by mouth daily with breakfast.   No facility-administered encounter medications on file  as of 04/23/2023.    Allergies (verified) Atorvastatin, Budesonide, Gabapentin, Metoclopramide, Rosuvastatin, Simvastatin, Wellbutrin [bupropion], and Sulfamethoxazole-trimethoprim   History: Past Medical History:  Diagnosis Date   Anemia    Anxiety and depression    Arthritis    HANDS,ARMS,FEET   Asthma    Atrioventricular block    CAD (coronary artery disease)    CHF (congestive heart failure) (HCC)    Chronic renal insufficiency    Clotting disorder (HCC)    Complication of anesthesia    Difficulty waking up once.   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 Otherwise normal colonoscopy   ESOPHAGOGASTRODUODENOSCOPY  11/16/2005   [facility]. Patchy areas of mucosal erythema in the antrum compatible with gastritis. Polyps in the stomach. Nodule in the antrum. Medium hiatal hernia   ESOPHAGOGASTRODUODENOSCOPY (EGD) WITH PROPOFOL N/A 04/13/2022   Procedure: ESOPHAGOGASTRODUODENOSCOPY (EGD) WITH PROPOFOL;  Surgeon: Lynann Bologna, MD;  Location: [facility];  Service: Gastroenterology;  Laterality: N/A;   MALONEY DILATION  04/13/2022   Procedure: Elease Hashimoto DILATION;  Surgeon: Lynann Bologna, MD;  Location: [facility];  Service: Gastroenterology;;   pacemaker  09/27/2016   PACEMAKER REVISION  09/27/2020   PACEMAKER UPGRADE DUAL CHAMBER TO BIV   Family History  Problem Relation Age of Onset   High blood pressure Mother    Diabetes Mother        no medication needed   Kidney disease Mother    Pneumonia Mother    Colon polyps Father    High blood pressure Father    Colon cancer Father    Cancer Maternal Grandmother        Leukemia   Cancer Other        Ovarian   Rectal cancer Neg Hx     Stomach cancer Neg Hx    Esophageal cancer Neg Hx    Liver cancer Neg Hx    Pancreatic cancer Neg Hx    Crohn's disease Neg Hx    Social History   Socioeconomic History   Marital status: Married    Spouse name: Darryl   Number of children: 3   Years of education: Not on file   Highest education level: 9th grade  Occupational History   Occupation: Retired  Tobacco Use   Smoking status: Never    Passive exposure: Past  Smokeless tobacco: Never  Vaping Use   Vaping status: Never Used  Substance and Sexual Activity   Alcohol use: Never   Drug use: Never   Sexual activity: Not on file  Other Topics Concern   Not on file  Social History Narrative   Lives at home with her husband   Right handed   Caffeine: mostly tea, 0-2 cups a day   Social Determinants of Health   Financial Resource Strain: High Risk (01/23/2023)   Overall Financial Resource Strain (CARDIA)    Difficulty of Paying Living Expenses: Hard  Food Insecurity: Low Risk  (11/11/2022)   [facility]   Hunger Vital Sign    Worried About Running Out of Food in the Last Year: Never true    Ran Out of Food in the Last Year: Never true  Transportation Needs: No Transportation Needs (01/23/2023)   PRAPARE - Administrator, Civil Service (Medical): No    Lack of Transportation (Non-Medical): No  Physical Activity: Inactive (04/20/2022)   Exercise Vital Sign    Days of Exercise per Week: 0 days    Minutes of Exercise per Session: 0 min  Stress: No Stress Concern Present (04/20/2022)   [employer] of Occupational Health - Occupational Stress Questionnaire    Feeling of Stress : Only a little  Social Connections: Moderately Integrated (04/20/2022)   Social Connection and Isolation Panel [NHANES]    Frequency of Communication with Friends and Family: More than three times a week    Frequency of Social Gatherings with Friends and Family: More than three times a week    Attends  Religious Services: 1 to 4 times per year    Active Member of [employer] or Organizations: No    Attends Banker Meetings: Never    Marital Status: Married    Tobacco Counseling Counseling given: Not Answered   Clinical Intake:                        Activities of Daily Living     No data to display           Patient Care Team: Blane Ohara, MD as PCP - General (Family Medicine) Reather Littler, MD (Inactive) as Consulting Physician (Endocrinology) Weston Settle, MD as Consulting Physician (Oncology) Diamond Nickel., MD as Referring Physician (Cardiology) Lynann Bologna, MD as Consulting Physician (Gastroenterology) [facility] (Ophthalmology) Zettie Pho, [facility] (Inactive) (Pharmacist) Diamond Nickel., MD as Referring Physician (Cardiology) Ninfa Meeker, FNP (Inactive) (Family Medicine) Ronda Fairly, MD (Infectious Diseases)  Indicate any recent Medical Services you may have received from other than [facility] providers in the past year (date may be approximate).     Assessment:   This is a routine wellness examination for Judye.  Hearing/Vision screen No results found.   Goals Addressed   None   Depression Screen    03/09/2023    9:16 AM 01/18/2023    7:52 AM 11/01/2022    9:47 AM 10/04/2022    7:56 AM 09/22/2022    8:22 AM 09/22/2022    8:04 AM 04/20/2022    8:22 AM  PHQ 2/9 Scores  PHQ - 2 Score 0 1 0 1 0 0 0  PHQ- 9 Score  5  9   7     Fall Risk    03/09/2023    9:16 AM 01/18/2023    7:52 AM 11/01/2022    9:47  AM 09/22/2022    8:04 AM 04/20/2022    8:06 AM  Fall Risk   Falls in the past year? 0 0 0 0 1  Number falls in past yr:  0 0 0 1  Injury with Fall?  0 0 0 0  Risk for fall due to :  No Fall Risks History of fall(s);Impaired mobility No Fall Risks History of fall(s);Impaired balance/gait;Impaired mobility  Follow up  Falls evaluation completed Falls evaluation completed Falls evaluation completed Falls prevention  discussed    MEDICARE RISK AT HOME:    TIMED UP AND GO:  Was the test performed?  {[phone]}    Cognitive Function:    10/22/2020    8:57 AM  MMSE - Mini Mental State Exam  Orientation to time 4  Orientation to Place 5  Registration 3  Attention/ Calculation 5  Recall 3  Language- name 2 objects 2  Language- repeat 1  Language- follow 3 step command 3  Language- read & follow direction 1  Write a sentence 1  Copy design 0  Total score 28        04/20/2022    8:13 AM 01/13/2021   11:06 AM 01/13/2020    9:47 AM  6CIT Screen  What Year? 0 points 0 points 0 points  What month? 0 points 0 points 0 points  What time? 0 points 0 points 0 points  Count back from 20 0 points 0 points 0 points  Months in reverse 0 points 2 points 2 points  Repeat phrase 0 points 2 points 2 points  Total Score 0 points 4 points 4 points    Immunizations Immunization History  Administered Date(s) Administered   COVID-19, mRNA, vaccine(Comirnaty)12 years and older 08/30/2022   Fluad Quad(high Dose 65+) 05/20/2020, 06/15/2021   Influenza, High Dose Seasonal PF 06/05/2017, 07/13/2022   Influenza,trivalent, recombinat, inj, PF 05/14/2012   Influenza-Unspecified 07/02/2014, 04/13/2015, 04/24/2016, 06/05/2017, 06/14/2018, 05/20/2020   PFIZER(Purple Top)SARS-COV-2 Vaccination 08/29/2019, 09/19/2019, 05/14/2020, 01/03/2021   Pfizer Covid-19 Vaccine Bivalent Booster 63yrs & up 06/28/2021   Pneumococcal Conjugate-13 04/27/2014   Pneumococcal Polysaccharide-23 07/14/2010, 07/14/2010, 12/02/2015   Tdap 11/01/2017    {TDAP status:2101805}  {Flu Vaccine status:2101806}  {Pneumococcal vaccine status:2101807}  {Covid-19 vaccine status:2101808}  Qualifies for Shingles Vaccine? {YES/NO:21197}  Zostavax completed {YES/NO:21197}  {Shingrix Completed?:2101804}  Screening Tests Health Maintenance  Topic Date Due   Zoster Vaccines- Shingrix (1 of 2) Never done   OPHTHALMOLOGY EXAM   01/12/2022   INFLUENZA VACCINE  03/15/2023   DEXA SCAN  04/02/2023   COVID-19 Vaccine (7 - 2023-24 season) 04/15/2023   HEMOGLOBIN A1C  09/13/2023   FOOT EXAM  10/05/2023   Diabetic kidney evaluation - eGFR measurement  01/18/2024   Diabetic kidney evaluation - Urine ACR  03/12/2024   Medicare Annual Wellness (AWV)  04/22/2024   DTaP/Tdap/Td (2 - Td or Tdap) 11/02/2027   Pneumonia Vaccine 2+ Years old  Completed   HPV VACCINES  Aged Out   Colonoscopy  Discontinued   Hepatitis C Screening  Discontinued    Health Maintenance  Health Maintenance Due  Topic Date Due   Zoster Vaccines- Shingrix (1 of 2) Never done   OPHTHALMOLOGY EXAM  01/12/2022   INFLUENZA VACCINE  03/15/2023   DEXA SCAN  04/02/2023   COVID-19 Vaccine (7 - 2023-24 season) 04/15/2023    {Colorectal cancer screening:2101809}  {Mammogram status:21018020}  {Bone Density status:21018021}  Lung Cancer Screening: (Low Dose CT Chest recommended if Age 2-80 years, 20 pack-year  currently smoking OR have quit w/in 15years.) {DOES NOT does:27190::"does not"} qualify.   Lung Cancer Screening Referral: ***  Additional Screening:  Hepatitis C Screening: {DOES NOT does:27190::"does not"} qualify; Completed ***  Vision Screening: Recommended annual ophthalmology exams for early detection of glaucoma and other disorders of the eye. Is the patient up to date with their annual eye exam?  {YES/NO:21197} Who is the provider or what is the name of the office in which the patient attends annual eye exams? *** If pt is not established with a provider, would they like to be referred to a provider to establish care? {YES/NO:21197}.   Dental Screening: Recommended annual dental exams for proper oral hygiene  Diabetic Foot Exam: {Diabetic Foot Exam:2101802}  Community Resource Referral / Chronic Care Management: CRR required this visit?  {YES/NO:21197}  CCM required this visit?  {CCM Required [phone]}     Plan:      I have personally reviewed and noted the following in the patient's chart:   Medical and social history Use of alcohol, tobacco or illicit drugs  Current medications and supplements including opioid prescriptions. {Opioid Prescriptions:[phone]} Functional ability and status Nutritional status Physical activity Advanced directives List of other physicians Hospitalizations, surgeries, and ER visits in previous 12 months Vitals Screenings to include cognitive, depression, and falls Referrals and appointments  In addition, I have reviewed and discussed with patient certain preventive protocols, quality metrics, and best practice recommendations. A written personalized care plan for preventive services as well as general preventive health recommendations were provided to patient.     Eugenie Norrie, CMA   04/22/2023   After Visit Summary: {CHL AMB AWV After Visit Summary:[phone]}  Nurse Notes: ***

## 2023-04-22 NOTE — Patient Instructions (Signed)

## 2023-04-23 ENCOUNTER — Ambulatory Visit (INDEPENDENT_AMBULATORY_CARE_PROVIDER_SITE_OTHER): Payer: Medicare Other | Admitting: Family Medicine

## 2023-04-23 ENCOUNTER — Encounter: Payer: Self-pay | Admitting: Family Medicine

## 2023-04-23 VITALS — BP 106/60 | HR 86 | Temp 94.7°F | Resp 16 | Ht 61.0 in | Wt 89.0 lb

## 2023-04-23 DIAGNOSIS — Z0000 Encounter for general adult medical examination without abnormal findings: Secondary | ICD-10-CM

## 2023-04-23 DIAGNOSIS — E1122 Type 2 diabetes mellitus with diabetic chronic kidney disease: Secondary | ICD-10-CM

## 2023-04-23 DIAGNOSIS — Z23 Encounter for immunization: Secondary | ICD-10-CM | POA: Diagnosis not present

## 2023-04-23 DIAGNOSIS — I130 Hypertensive heart and chronic kidney disease with heart failure and stage 1 through stage 4 chronic kidney disease, or unspecified chronic kidney disease: Secondary | ICD-10-CM

## 2023-04-23 DIAGNOSIS — E559 Vitamin D deficiency, unspecified: Secondary | ICD-10-CM | POA: Diagnosis not present

## 2023-04-23 DIAGNOSIS — F330 Major depressive disorder, recurrent, mild: Secondary | ICD-10-CM

## 2023-04-23 DIAGNOSIS — E782 Mixed hyperlipidemia: Secondary | ICD-10-CM

## 2023-04-23 DIAGNOSIS — N189 Chronic kidney disease, unspecified: Secondary | ICD-10-CM | POA: Diagnosis not present

## 2023-04-23 DIAGNOSIS — M810 Age-related osteoporosis without current pathological fracture: Secondary | ICD-10-CM

## 2023-04-23 DIAGNOSIS — I509 Heart failure, unspecified: Secondary | ICD-10-CM

## 2023-04-23 DIAGNOSIS — E1121 Type 2 diabetes mellitus with diabetic nephropathy: Secondary | ICD-10-CM

## 2023-04-23 NOTE — Assessment & Plan Note (Signed)
Well controlled.  No changes to medicines.  Continue Lovastatin 40 mg take 1 tablet daily Continue to work on eating a healthy diet and exercise.  Labs drawn today.

## 2023-04-23 NOTE — Progress Notes (Signed)
Subjective:  Patient ID: Bridget [name], female    DOB: [birth year]  Age: 82 y.o. MRN: [id number]  Chief Complaint  Patient presents with   Diabetes    HPI Diabetes:  Complications: CKD Most recent A1C: 6.4 (03/13/2023) Checking sugars daily. 91-115 Current medications: Metformin 500 mg take 1 tablet twice daily. Jardiance 10 mg daily. Last Eye Exam:01/12/2021. Foot checks: daily Dr. Lucianne Muss endocrinologist.   Hyperlipidemia: Current medications:Lovastatin 40 mg take 1 tablet daily  Hypertension with chf: Current medications: furosemide 20 mg daily, entresto 49/51 mg twice daily, digoxin 0.125 mg 1/2 every other day, carvedilol 3.125 mg 1 twice daily, baby aspirin 81 mg daily, Ntg 0.4 mg SL every 5 minutes as needed chest pain. Patient sees Dr. Judithe Modest. Dr. Judithe Modest discontinue xarelto in 06/2022.  Hypomagnesemia: no longer taking magnesium.  Depression: Patient is currently taking Venlafaxine XR 75 mg 2 capsules daily. Doing well .  GERD: on omeprazole 20 mg daily. Having dysphagia occasionally, but improved. Has had a full evaluation.   Mild Persistent Asthma: Patient is currently taking Breztri 2 puffs once daily  Diet: good Exercise: minimal     04/23/2023    8:09 AM 03/09/2023    9:16 AM 01/18/2023    7:52 AM 11/01/2022    9:47 AM 09/22/2022    8:04 AM  Fall Risk   Falls in the past year? 1 0 0 0 0  Number falls in past yr: 1  0 0 0  Injury with Fall? 0  0 0 0  Risk for fall due to : History of fall(s)  No Fall Risks History of fall(s);Impaired mobility No Fall Risks  Follow up Falls evaluation completed  Falls evaluation completed Falls evaluation completed Falls evaluation completed        04/23/2023    8:09 AM 03/09/2023    9:16 AM 01/18/2023    7:52 AM 11/01/2022    9:47 AM 10/04/2022    7:56 AM  Depression screen PHQ 2/9  Decreased Interest 1 0 0 0 0  Down, Depressed, Hopeless 1 0 1 0 1  PHQ - 2 Score 2 0 1 0 1  Altered sleeping 2  1  1   Tired, decreased energy 3   1  3   Change in appetite 1  1  1   Feeling bad or failure about yourself  1  0  1  Trouble concentrating 0  1  2  Moving slowly or fidgety/restless 1  0  0  Suicidal thoughts 0  0  0  PHQ-9 Score 10  5  9   Difficult doing work/chores Somewhat difficult  Not difficult at all  Not difficult at all     Mini-Cog - 04/23/23 0911     Normal clock drawing test? yes    How many words correct? 1              Functional Status Survey: Is the patient deaf or have difficulty hearing?: Yes (does not have hearing aids, nor does she want them.) Does the patient have difficulty seeing, even when wearing glasses/contacts?: No Does the patient have difficulty concentrating, remembering, or making decisions?: Yes Does the patient have difficulty walking or climbing stairs?: No Does the patient have difficulty dressing or bathing?: Yes (trouble dressing. requires her husband to help.) Does the patient have difficulty doing errands alone such as visiting a doctor's office or shopping?: Yes (does not drive.)   . Health Maintenance  Topic Date Due   Zoster (Shingles) Vaccine (  1 of 2) Never done   Eye exam for diabetics  01/12/2022   DEXA scan (bone density measurement)  04/02/2023   COVID-19 Vaccine (7 - 2023-24 season) 04/15/2023   Hemoglobin A1C  09/13/2023   Yearly kidney function blood test for diabetes  01/18/2024   Yearly kidney health urinalysis for diabetes  03/12/2024   Complete foot exam   04/22/2024   Medicare Annual Wellness Visit  04/22/2024   DTaP/Tdap/Td vaccine (2 - Td or Tdap) 11/02/2027   Pneumonia Vaccine  Completed   Flu Shot  Completed   HPV Vaccine  Aged Out   Colon Cancer Screening  Discontinued   Hepatitis C Screening  Discontinued     Social Hx   Social History   Socioeconomic History   Marital status: Married    Spouse name: Darryl   Number of children: 3   Years of education: Not on file   Highest education level: 9th grade  Occupational History    Occupation: Retired  Tobacco Use   Smoking status: Never    Passive exposure: Past   Smokeless tobacco: Never  Vaping Use   Vaping status: Never Used  Substance and Sexual Activity   Alcohol use: Never   Drug use: Never   Sexual activity: Not on file  Other Topics Concern   Not on file  Social History Narrative   Lives at home with her husband   Right handed   Caffeine: mostly tea, 0-2 cups a day   Social Determinants of Health   Financial Resource Strain: Low Risk  (04/23/2023)   Overall Financial Resource Strain (CARDIA)    Difficulty of Paying Living Expenses: Not hard at all  Recent Concern: Financial Resource Strain - High Risk (01/23/2023)   Overall Financial Resource Strain (CARDIA)    Difficulty of Paying Living Expenses: Hard  Food Insecurity: No Food Insecurity (04/23/2023)   Hunger Vital Sign    Worried About Running Out of Food in the Last Year: Never true    Ran Out of Food in the Last Year: Never true  Transportation Needs: No Transportation Needs (04/23/2023)   PRAPARE - Administrator, Civil Service (Medical): No    Lack of Transportation (Non-Medical): No  Physical Activity: Insufficiently Active (04/23/2023)   Exercise Vital Sign    Days of Exercise per Week: 7 days    Minutes of Exercise per Session: 10 min  Stress: No Stress Concern Present (04/23/2023)   [employer] of Occupational Health - Occupational Stress Questionnaire    Feeling of Stress : Only a little  Social Connections: Socially Integrated (04/23/2023)   Social Connection and Isolation Panel [NHANES]    Frequency of Communication with Friends and Family: More than three times a week    Frequency of Social Gatherings with Friends and Family: Once a week    Attends Religious Services: More than 4 times per year    Active Member of [employer] or Organizations: Yes    Attends Engineer, structural: More than 4 times per year    Marital Status: Married   Past Medical History:   Diagnosis Date   Anemia    Anxiety and depression    Arthritis    HANDS,ARMS,FEET   Asthma    Atrioventricular block    CAD (coronary artery disease)    CHF (congestive heart failure) (HCC)    Chronic renal insufficiency    Clotting disorder (HCC)    Complication of anesthesia    Difficulty waking  up once.   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)    Sleep apnea    Statin myopathy    Thyroid disease    Vitamin D deficiency    Family History  Problem Relation Age of Onset   High blood pressure Mother    Diabetes Mother        no medication needed   Kidney disease Mother    Pneumonia Mother    Colon polyps Father    High blood pressure Father    Colon cancer Father    Cancer Maternal Grandmother        Leukemia   Cancer Other        Ovarian   Rectal cancer Neg Hx    Stomach cancer Neg Hx    Esophageal cancer Neg Hx    Liver cancer Neg Hx    Pancreatic cancer Neg Hx    Crohn's disease Neg Hx     Review of Systems  Constitutional:  Negative for chills, fatigue and fever.  HENT:  Positive for congestion. Negative for ear pain, rhinorrhea and sore throat.   Respiratory:  Positive for shortness of breath. Negative for cough.   Cardiovascular:  Negative for chest pain.  Gastrointestinal:  Positive for constipation and diarrhea. Negative for abdominal pain, nausea and vomiting.  Genitourinary:  Positive for frequency. Negative for dysuria and urgency.   Musculoskeletal:  Negative for back pain and myalgias.  Neurological:  Positive for light-headedness and headaches. Negative for dizziness and weakness.  Psychiatric/Behavioral:  Positive for dysphoric mood. The patient is nervous/anxious.      Objective:      04/23/2023    8:01 AM 03/13/2023    8:08 AM 03/09/2023    9:17 AM  Vitals with BMI  Height 5\' 1"  5\' 1"  5\' 1"   Weight 89 lbs 88 lbs 87 lbs  BMI 16.83 16.64 16.45  Systolic 106 118   Diastolic 60 68   Pulse 86 95     No data found.   Physical Exam Vitals reviewed.  Constitutional:      Appearance: Normal appearance.     Comments: thin  Neck:     Vascular: No carotid bruit.  Cardiovascular:     Rate and Rhythm: Normal rate and regular rhythm.     Heart sounds: Normal heart sounds.  Pulmonary:     Effort: Pulmonary effort is normal. No respiratory distress.     Breath sounds: Normal breath sounds.  Abdominal:     General: Abdomen is flat. Bowel sounds are normal.     Palpations: Abdomen is soft.     Tenderness: There is no abdominal tenderness.  Neurological:     Mental Status: She is alert and oriented to [name], place, and time.  Psychiatric:        Mood and Affect: Mood normal.  Behavior: Behavior normal.    Diabetic Foot Exam - Simple   Simple Foot Form Diabetic Foot exam was performed with the following findings: Yes 04/23/2023  9:12 AM  Visual Inspection No deformities, no ulcerations, no other skin breakdown bilaterally: Yes Sensation Testing Intact to touch and monofilament testing bilaterally: Yes Pulse Check Posterior Tibialis and Dorsalis pulse intact bilaterally: Yes Comments      Lab Results  Component Value Date   WBC 7.0 01/18/2023   HGB 13.1 01/18/2023   HCT 39.8 01/18/2023   PLT 176 01/18/2023   GLUCOSE 125 (H) 01/18/2023   CHOL 167 01/18/2023   TRIG 110 01/18/2023   HDL 80 01/18/2023   LDLDIRECT 94.2 12/05/2013   LDLCALC 68 01/18/2023   ALT 29 01/18/2023   AST 30  01/18/2023   NA 139 01/18/2023   K 5.0 01/18/2023   CL 103 01/18/2023   CREATININE 1.25 (H) 01/18/2023   BUN 42 (H) 01/18/2023   CO2 21 01/18/2023   TSH 1.220 04/20/2022   INR 1.3 (H) 11/12/2020   HGBA1C 6.4 (A) 03/13/2023   MICROALBUR 0.8 03/13/2023      Assessment & Plan:   Problem List Items Addressed This Visit       Active Problems   Age-related osteoporosis without current pathological fracture    Check vitamin D.  Ordered dexa.      Relevant Orders   DG Bone Density   Depression, major, recurrent, mild (HCC)    The current medical regimen is effective;  continue present plan and medications. Venlafaxine XR 75 mg take 1 capsule daily.      Diabetic glomerulopathy (HCC)    Control: good Recommend check sugars fasting daily. Recommend check feet daily. Recommend annual eye exams. Medicines: continue Metformin 500 mg take 1 tablet twice daily. Jardiance 10 mg daily Continue to work on eating a healthy diet and exercise.          Hypomagnesemia    Check mg level      Relevant Orders   Magnesium   Mixed hyperlipidemia    Well controlled.  No changes to medicines.  Continue Lovastatin 40 mg take 1 tablet daily Continue to work on eating a healthy diet and exercise.  Labs drawn today.        Need for influenza vaccination - Primary   Relevant Orders   Flu Vaccine Trivalent High Dose (Fluad) (Completed)   Vitamin D deficiency disease   Relevant Orders   VITAMIN D 25 Hydroxy (Vit-D Deficiency, Fractures)      This is a list of the screening recommended for you and due dates:  Health Maintenance  Topic Date Due   Zoster (Shingles) Vaccine (1 of 2) Never done   Eye exam for diabetics  01/12/2022   DEXA scan (bone density measurement)  04/02/2023   COVID-19 Vaccine (7 - 2023-24 season) 04/15/2023   Hemoglobin A1C  09/13/2023   Yearly kidney function blood test for diabetes  01/18/2024   Yearly kidney health urinalysis for diabetes  03/12/2024    Complete foot exam   04/22/2024   Medicare Annual Wellness Visit  04/22/2024   DTaP/Tdap/Td vaccine (2 - Td or Tdap) 11/02/2027   Pneumonia Vaccine  Completed   Flu Shot  Completed   HPV Vaccine  Aged Out   Colon Cancer Screening  Discontinued   Hepatitis C Screening  Discontinued     Follow-up: Return in 3 months (on 07/23/2023) for chronic follow up.  Blane Ohara, MD [name] [phone]

## 2023-04-23 NOTE — Assessment & Plan Note (Addendum)
Check vitamin D.  Ordered dexa.

## 2023-04-23 NOTE — Assessment & Plan Note (Signed)
Check mg level

## 2023-04-23 NOTE — Assessment & Plan Note (Signed)
The current medical regimen is effective;  continue present plan and medications. Venlafaxine XR 75 mg take 1 capsule daily.

## 2023-04-23 NOTE — Assessment & Plan Note (Signed)
Control: good Recommend check sugars fasting daily. Recommend check feet daily. Recommend annual eye exams. Medicines: continue Metformin 500 mg take 1 tablet twice daily. Jardiance 10 mg daily Continue to work on eating a healthy diet and exercise.

## 2023-04-24 ENCOUNTER — Encounter: Payer: Self-pay | Admitting: Family Medicine

## 2023-04-24 DIAGNOSIS — I1310 Hypertensive heart and chronic kidney disease without heart failure, with stage 1 through stage 4 chronic kidney disease, or unspecified chronic kidney disease: Secondary | ICD-10-CM | POA: Diagnosis not present

## 2023-04-24 DIAGNOSIS — I340 Nonrheumatic mitral (valve) insufficiency: Secondary | ICD-10-CM | POA: Diagnosis not present

## 2023-04-24 DIAGNOSIS — N1832 Chronic kidney disease, stage 3b: Secondary | ICD-10-CM | POA: Diagnosis not present

## 2023-04-24 LAB — MAGNESIUM: Magnesium: 1.9 mg/dL (ref 1.6–2.3)

## 2023-04-24 LAB — VITAMIN D 25 HYDROXY (VIT D DEFICIENCY, FRACTURES): Vit D, 25-Hydroxy: 48.9 ng/mL (ref 30.0–100.0)

## 2023-05-01 ENCOUNTER — Other Ambulatory Visit: Payer: Self-pay | Admitting: Family Medicine

## 2023-05-01 DIAGNOSIS — F330 Major depressive disorder, recurrent, mild: Secondary | ICD-10-CM

## 2023-05-07 ENCOUNTER — Telehealth: Payer: Self-pay | Admitting: Family Medicine

## 2023-05-07 ENCOUNTER — Other Ambulatory Visit: Payer: Self-pay

## 2023-05-07 DIAGNOSIS — M810 Age-related osteoporosis without current pathological fracture: Secondary | ICD-10-CM

## 2023-05-07 NOTE — Telephone Encounter (Signed)
   Bridget [name] has been scheduled for the following appointment:  WHAT: BONE DENSITY WHERE: [facility] DATE: 05/24/23 TIME: 1:00 PM CHECK-IN  Patient has been made aware. SPOKE TO PT'S HUSBAND

## 2023-05-15 DIAGNOSIS — N1832 Chronic kidney disease, stage 3b: Secondary | ICD-10-CM | POA: Diagnosis not present

## 2023-05-23 ENCOUNTER — Ambulatory Visit: Payer: Medicare Other | Admitting: Family Medicine

## 2023-05-24 ENCOUNTER — Other Ambulatory Visit: Payer: Self-pay | Admitting: Family Medicine

## 2023-06-08 DIAGNOSIS — Z7982 Long term (current) use of aspirin: Secondary | ICD-10-CM | POA: Diagnosis not present

## 2023-06-08 DIAGNOSIS — E119 Type 2 diabetes mellitus without complications: Secondary | ICD-10-CM | POA: Diagnosis not present

## 2023-06-08 DIAGNOSIS — R0602 Shortness of breath: Secondary | ICD-10-CM | POA: Diagnosis not present

## 2023-06-08 DIAGNOSIS — Z8673 Personal history of transient ischemic attack (TIA), and cerebral infarction without residual deficits: Secondary | ICD-10-CM | POA: Diagnosis not present

## 2023-06-08 DIAGNOSIS — Z950 Presence of cardiac pacemaker: Secondary | ICD-10-CM | POA: Diagnosis not present

## 2023-06-08 DIAGNOSIS — I252 Old myocardial infarction: Secondary | ICD-10-CM | POA: Diagnosis not present

## 2023-06-08 DIAGNOSIS — I351 Nonrheumatic aortic (valve) insufficiency: Secondary | ICD-10-CM | POA: Diagnosis not present

## 2023-06-08 DIAGNOSIS — N183 Chronic kidney disease, stage 3 unspecified: Secondary | ICD-10-CM | POA: Diagnosis not present

## 2023-06-08 DIAGNOSIS — J9811 Atelectasis: Secondary | ICD-10-CM | POA: Diagnosis not present

## 2023-06-08 DIAGNOSIS — S43001A Unspecified subluxation of right shoulder joint, initial encounter: Secondary | ICD-10-CM | POA: Diagnosis not present

## 2023-06-08 DIAGNOSIS — I5020 Unspecified systolic (congestive) heart failure: Secondary | ICD-10-CM | POA: Diagnosis not present

## 2023-06-08 DIAGNOSIS — J168 Pneumonia due to other specified infectious organisms: Secondary | ICD-10-CM | POA: Diagnosis not present

## 2023-06-08 DIAGNOSIS — I110 Hypertensive heart disease with heart failure: Secondary | ICD-10-CM | POA: Diagnosis not present

## 2023-06-08 DIAGNOSIS — I130 Hypertensive heart and chronic kidney disease with heart failure and stage 1 through stage 4 chronic kidney disease, or unspecified chronic kidney disease: Secondary | ICD-10-CM | POA: Diagnosis not present

## 2023-06-08 DIAGNOSIS — R7989 Other specified abnormal findings of blood chemistry: Secondary | ICD-10-CM | POA: Diagnosis not present

## 2023-06-08 DIAGNOSIS — I2510 Atherosclerotic heart disease of native coronary artery without angina pectoris: Secondary | ICD-10-CM | POA: Diagnosis not present

## 2023-06-08 DIAGNOSIS — I5023 Acute on chronic systolic (congestive) heart failure: Secondary | ICD-10-CM | POA: Diagnosis not present

## 2023-06-08 DIAGNOSIS — I2489 Other forms of acute ischemic heart disease: Secondary | ICD-10-CM | POA: Diagnosis not present

## 2023-06-08 DIAGNOSIS — E1122 Type 2 diabetes mellitus with diabetic chronic kidney disease: Secondary | ICD-10-CM | POA: Diagnosis not present

## 2023-06-08 DIAGNOSIS — T502X5A Adverse effect of carbonic-anhydrase inhibitors, benzothiadiazides and other diuretics, initial encounter: Secondary | ICD-10-CM | POA: Diagnosis not present

## 2023-06-08 DIAGNOSIS — I361 Nonrheumatic tricuspid (valve) insufficiency: Secondary | ICD-10-CM | POA: Diagnosis not present

## 2023-06-08 DIAGNOSIS — Z7984 Long term (current) use of oral hypoglycemic drugs: Secondary | ICD-10-CM | POA: Diagnosis not present

## 2023-06-08 DIAGNOSIS — I3139 Other pericardial effusion (noninflammatory): Secondary | ICD-10-CM | POA: Diagnosis not present

## 2023-06-08 DIAGNOSIS — M797 Fibromyalgia: Secondary | ICD-10-CM | POA: Diagnosis not present

## 2023-06-08 DIAGNOSIS — I371 Nonrheumatic pulmonary valve insufficiency: Secondary | ICD-10-CM | POA: Diagnosis not present

## 2023-06-08 DIAGNOSIS — N179 Acute kidney failure, unspecified: Secondary | ICD-10-CM | POA: Diagnosis not present

## 2023-06-08 DIAGNOSIS — E782 Mixed hyperlipidemia: Secondary | ICD-10-CM | POA: Diagnosis not present

## 2023-06-08 DIAGNOSIS — I509 Heart failure, unspecified: Secondary | ICD-10-CM | POA: Diagnosis not present

## 2023-06-08 DIAGNOSIS — I340 Nonrheumatic mitral (valve) insufficiency: Secondary | ICD-10-CM | POA: Diagnosis not present

## 2023-06-08 DIAGNOSIS — Z86718 Personal history of other venous thrombosis and embolism: Secondary | ICD-10-CM | POA: Diagnosis not present

## 2023-06-08 DIAGNOSIS — R918 Other nonspecific abnormal finding of lung field: Secondary | ICD-10-CM | POA: Diagnosis not present

## 2023-06-08 DIAGNOSIS — J90 Pleural effusion, not elsewhere classified: Secondary | ICD-10-CM | POA: Diagnosis not present

## 2023-06-08 DIAGNOSIS — J449 Chronic obstructive pulmonary disease, unspecified: Secondary | ICD-10-CM | POA: Diagnosis not present

## 2023-06-14 DIAGNOSIS — I5023 Acute on chronic systolic (congestive) heart failure: Secondary | ICD-10-CM | POA: Diagnosis not present

## 2023-06-19 DIAGNOSIS — T827XXD Infection and inflammatory reaction due to other cardiac and vascular devices, implants and grafts, subsequent encounter: Secondary | ICD-10-CM | POA: Diagnosis not present

## 2023-06-19 DIAGNOSIS — Z792 Long term (current) use of antibiotics: Secondary | ICD-10-CM | POA: Diagnosis not present

## 2023-06-20 ENCOUNTER — Ambulatory Visit (INDEPENDENT_AMBULATORY_CARE_PROVIDER_SITE_OTHER): Payer: Medicare Other

## 2023-06-20 DIAGNOSIS — Z23 Encounter for immunization: Secondary | ICD-10-CM | POA: Diagnosis not present

## 2023-06-26 NOTE — Progress Notes (Unsigned)
[facility]  [address] [place],  [place]  [address] [phone]  Clinic Day:  06/26/2023  Referring physician: Blane Ohara, MD  HISTORY OF PRESENT ILLNESS:  The patient is a 82 y.o. female with anemia secondary to chronic renal insufficiency.  Most recently, labs showed evidence of iron deficiency anemia for which she recently received IV iron.  She comes in today to reassess her iron and hemoglobin levels.  Since her last visit, the patient has been hospitalized and is currently being treated for congestive heart failure.  She denies having increased fatigue or any overt forms of blood loss which concern her for progressive anemia.  PHYSICAL EXAM:  There were no vitals taken for this visit. Wt Readings from Last 3 Encounters:  04/23/23 89 lb (40.4 kg)  03/13/23 88 lb (39.9 kg)  03/09/23 87 lb (39.5 kg)   There is no height or weight on file to calculate BMI. Performance status (ECOG): 1 - Symptomatic but completely ambulatory Physical Exam Constitutional:      Appearance: Normal appearance. She is not ill-appearing.     Comments: She looks thinner vs previous visits  HENT:     Mouth/Throat:     Mouth: Mucous membranes are moist.     Pharynx: Oropharynx is clear. No oropharyngeal exudate or posterior oropharyngeal erythema.  Cardiovascular:     Rate and Rhythm: Normal rate and regular rhythm.     Heart sounds: No murmur heard.    No friction rub. No gallop.  Pulmonary:     Effort: Pulmonary effort is normal. No respiratory distress.     Breath sounds: Normal breath sounds. No wheezing, rhonchi or rales.  Abdominal:     General: Bowel sounds are normal. There is no distension.     Palpations: Abdomen is soft. There is no mass.     Tenderness: There is no abdominal tenderness.  Musculoskeletal:        General: No swelling.     Right lower leg: No edema.     Left lower leg: No edema.  Lymphadenopathy:     Cervical: No cervical  adenopathy.     Upper Body:     Right upper body: No supraclavicular or axillary adenopathy.     Left upper body: No supraclavicular or axillary adenopathy.     Lower Body: No right inguinal adenopathy. No left inguinal adenopathy.  Skin:    General: Skin is warm.     Coloration: Skin is not jaundiced.     Findings: No rash.  Neurological:     General: No focal deficit present.     Mental Status: She is alert and oriented to person, place, and time. Mental status is at baseline.  Psychiatric:        Mood and Affect: Mood normal.        Behavior: Behavior normal.        Thought Content: Thought content normal.           LABS:     Latest Reference Range & Units 12/25/22 09:51  Sodium 135 - 145 mmol/L 137  Potassium 3.5 - 5.1 mmol/L 4.2  Chloride 98 - 111 mmol/L 103  CO2 22 - 32 mmol/L 23  Glucose 70 - 99 mg/dL 694 (H)  BUN 8 - 23 mg/dL 57 (H)  Creatinine 8.54 - 1.00 mg/dL 6.27 (H)  Calcium 8.9 - 10.3 mg/dL 9.4  Anion gap 5 - 15  11  Alkaline Phosphatase 38 - 126 U/L  107  Albumin 3.5 - 5.0 g/dL 3.6  AST 15 - 41 U/L 34  ALT 0 - 44 U/L 47 (H)  Total Protein 6.5 - 8.1 g/dL 6.7  Total Bilirubin 0.3 - 1.2 mg/dL 0.9  (H): Data is abnormally high  Latest Reference Range & Units 12/25/22 09:51  Iron 28 - 170 ug/dL 84  UIBC ug/dL 161  TIBC 096 - 045 ug/dL 409  Saturation Ratios 10.4 - 31.8 % 23  Ferritin 11 - 307 ng/mL 466 (H)  (H): Data is abnormally high  ASSESSMENT & PLAN:  Assessment/Plan:  A 82 y.o. female with anemia secondary to previous renal insufficiency as well as recent iron deficiency.  I am very pleased with the improvement in her hemoglobin since her IV iron was given.  Furthermore, her iron parameters are considerably better.  Overall, she appears to be doing well from a hematologic standpoint.  I will see her back in 6 months for repeat clinical assessment. The patient understands all the plans discussed today and is in agreement with them.    Lizandra Zakrzewski Kirby Funk,  MD

## 2023-06-27 ENCOUNTER — Inpatient Hospital Stay: Payer: Medicare Other | Attending: Oncology | Admitting: Oncology

## 2023-06-27 ENCOUNTER — Telehealth: Payer: Self-pay | Admitting: Oncology

## 2023-06-27 ENCOUNTER — Inpatient Hospital Stay: Payer: Medicare Other

## 2023-06-27 VITALS — BP 139/105 | HR 76 | Temp 97.8°F | Resp 14 | Ht 61.0 in | Wt 88.2 lb

## 2023-06-27 DIAGNOSIS — D508 Other iron deficiency anemias: Secondary | ICD-10-CM | POA: Diagnosis not present

## 2023-06-27 DIAGNOSIS — D509 Iron deficiency anemia, unspecified: Secondary | ICD-10-CM | POA: Insufficient documentation

## 2023-06-27 DIAGNOSIS — D631 Anemia in chronic kidney disease: Secondary | ICD-10-CM | POA: Insufficient documentation

## 2023-06-27 DIAGNOSIS — N189 Chronic kidney disease, unspecified: Secondary | ICD-10-CM | POA: Diagnosis not present

## 2023-06-27 LAB — CMP (CANCER CENTER ONLY)
ALT: 19 U/L (ref 0–44)
AST: 27 U/L (ref 15–41)
Albumin: 4 g/dL (ref 3.5–5.0)
Alkaline Phosphatase: 93 U/L (ref 38–126)
Anion gap: 14 (ref 5–15)
BUN: 52 mg/dL — ABNORMAL HIGH (ref 8–23)
CO2: 25 mmol/L (ref 22–32)
Calcium: 9.8 mg/dL (ref 8.9–10.3)
Chloride: 98 mmol/L (ref 98–111)
Creatinine: 1.6 mg/dL — ABNORMAL HIGH (ref 0.44–1.00)
GFR, Estimated: 32 mL/min — ABNORMAL LOW (ref >60)
Glucose, Bld: 169 mg/dL — ABNORMAL HIGH (ref 70–99)
Potassium: 4.3 mmol/L (ref 3.5–5.1)
Sodium: 137 mmol/L (ref 135–145)
Total Bilirubin: 0.4 mg/dL (ref <1.2)
Total Protein: 6.4 g/dL — ABNORMAL LOW (ref 6.5–8.1)

## 2023-06-27 LAB — IRON AND TIBC
Iron: 89 ug/dL (ref 28–170)
Saturation Ratios: 24 % (ref 10.4–31.8)
TIBC: 379 ug/dL (ref 250–450)
UIBC: 290 ug/dL

## 2023-06-27 LAB — CBC WITH DIFFERENTIAL (CANCER CENTER ONLY)
Abs Immature Granulocytes: 0.06 10*3/uL (ref 0.00–0.07)
Basophils Absolute: 0.1 10*3/uL (ref 0.0–0.1)
Basophils Relative: 1 %
Eosinophils Absolute: 0.3 10*3/uL (ref 0.0–0.5)
Eosinophils Relative: 3 %
HCT: 39.5 % (ref 36.0–46.0)
Hemoglobin: 12.9 g/dL (ref 12.0–15.0)
Immature Granulocytes: 1 %
Lymphocytes Relative: 15 %
Lymphs Abs: 1.2 10*3/uL (ref 0.7–4.0)
MCH: 30.6 pg (ref 26.0–34.0)
MCHC: 32.7 g/dL (ref 30.0–36.0)
MCV: 93.6 fL (ref 80.0–100.0)
Monocytes Absolute: 0.5 10*3/uL (ref 0.1–1.0)
Monocytes Relative: 6 %
Neutro Abs: 5.7 10*3/uL (ref 1.7–7.7)
Neutrophils Relative %: 74 %
Platelet Count: 173 10*3/uL (ref 150–400)
RBC: 4.22 MIL/uL (ref 3.87–5.11)
RDW: 13.6 % (ref 11.5–15.5)
WBC Count: 7.7 10*3/uL (ref 4.0–10.5)
nRBC: 0 % (ref 0.0–0.2)
nRBC: 0 /100{WBCs}

## 2023-06-27 LAB — FERRITIN: Ferritin: 332 ng/mL — ABNORMAL HIGH (ref 11–307)

## 2023-06-27 NOTE — Progress Notes (Signed)
[facility]  [address] [place],  [place]  [address] [phone]  Clinic Day:  06/27/2023  Referring physician: Blane Ohara, MD   HISTORY OF PRESENT ILLNESS:  The patient is a 82 y.o. female with anemia secondary to chronic renal insufficiency.  There is also a history of iron deficiency anemia, for which she received IV iron in February 2024.  She comes in today to reassess her iron and hemoglobin levels.  Since her last visit, the patient has been hospitalized again for problems related to congestive heart failure.  Although it is better controlled, the patient still complains of feeling weak.  She denies having any overt forms of blood loss since her last visit.   PHYSICAL EXAM:  Blood pressure (!) 139/105, pulse 76, temperature 97.8 F (36.6 C), resp. rate 14, height 5\' 1"  (1.549 m), weight 88 lb 3.2 oz (40 kg), SpO2 100%. Wt Readings from Last 3 Encounters:  06/27/23 88 lb 3.2 oz (40 kg)  04/23/23 89 lb (40.4 kg)  03/13/23 88 lb (39.9 kg)   Body mass index is 16.67 kg/m. Performance status (ECOG): 2 - Symptomatic, <50% confined to bed Physical Exam Constitutional:      Appearance: Normal appearance. She is not ill-appearing.     Comments: She looks thinner and weaker versus previous visits  HENT:     Mouth/Throat:     Mouth: Mucous membranes are moist.     Pharynx: Oropharynx is clear. No oropharyngeal exudate or posterior oropharyngeal erythema.  Cardiovascular:     Rate and Rhythm: Normal rate and regular rhythm.     Heart sounds: No murmur heard.    No friction rub. No gallop.  Pulmonary:     Effort: Pulmonary effort is normal. No respiratory distress.     Breath sounds: Normal breath sounds. No wheezing, rhonchi or rales.  Abdominal:     General: Bowel sounds are normal. There is no distension.     Palpations: Abdomen is soft. There is no mass.     Tenderness: There is no abdominal tenderness.  Musculoskeletal:         General: No swelling.     Right lower leg: No edema.     Left lower leg: No edema.  Lymphadenopathy:     Cervical: No cervical adenopathy.     Upper Body:     Right upper body: No supraclavicular or axillary adenopathy.     Left upper body: No supraclavicular or axillary adenopathy.     Lower Body: No right inguinal adenopathy. No left inguinal adenopathy.  Skin:    General: Skin is warm.     Coloration: Skin is not jaundiced.     Findings: No lesion or rash.  Neurological:     General: No focal deficit present.     Mental Status: She is alert and oriented to person, place, and time. Mental status is at baseline.  Psychiatric:        Mood and Affect: Mood normal.        Behavior: Behavior normal.        Thought Content: Thought content normal.    LABS:      Latest Ref Rng & Units 06/27/2023    9:21 AM 01/18/2023    8:25 AM 12/25/2022   12:00 AM  CBC  WBC 4.0 - 10.5 K/uL 7.7  7.0  7.1      Hemoglobin 12.0 - 15.0 g/dL 66.0  63.0  16.0  Hematocrit 36.0 - 46.0 % 39.5  39.8  40      Platelets 150 - 400 K/uL 173  176  153         This result is from an external source.      Latest Ref Rng & Units 06/27/2023    9:21 AM 01/18/2023    8:25 AM 12/25/2022    9:51 AM  CMP  Glucose 70 - 99 mg/dL 536  644  034   BUN 8 - 23 mg/dL 52  42  57   Creatinine 0.44 - 1.00 mg/dL 7.42  5.95  6.38   Sodium 135 - 145 mmol/L 137  139  137   Potassium 3.5 - 5.1 mmol/L 4.3  5.0  4.2   Chloride 98 - 111 mmol/L 98  103  103   CO2 22 - 32 mmol/L 25  21  23    Calcium 8.9 - 10.3 mg/dL 9.8  9.8  9.4   Total Protein 6.5 - 8.1 g/dL 6.4  5.8  6.7   Total Bilirubin <1.2 mg/dL 0.4  0.4  0.9   Alkaline Phos 38 - 126 U/L 93  100  107   AST 15 - 41 U/L 27  30  34   ALT 0 - 44 U/L 19  29  47     Latest Reference Range & Units 06/27/23 09:21  Iron 28 - 170 ug/dL 89  UIBC ug/dL 756  TIBC 433 - 295 ug/dL 188  Saturation Ratios 10.4 - 31.8 % 24  Ferritin 11 - 307 ng/mL 332 (H)  (H): Data is abnormally  high  ASSESSMENT & PLAN:  Assessment/Plan:  A 82 y.o. female with anemia secondary to chronic renal insufficiency, as well as a history of iron deficiency.  I am very pleased with her hemoglobin of 12.9 today.  Furthermore, her iron parameters are normal.  She continues to show evidence of mild renal insufficiency, which is also likely being affected by the diuretics being used to treat her congestive heart failure.  Overall, from a hematologic perspective, the patient appears to be doing well.  She knows it will be imperative for her to work very closely with her cardiologist to ensure her congestive heart failure is being maximally managed medically.  Otherwise, I will see this patient back in 6 months for repeat clinical assessment.  The patient understands all the plans discussed today and is in agreement with them.    Claire Bridge Kirby Funk, MD

## 2023-06-27 NOTE — Telephone Encounter (Signed)
Patient has been scheduled for follow-up visit per 06/27/23 LOS.  Pt given an appt calendar with date and time.

## 2023-06-28 DIAGNOSIS — Z45018 Encounter for adjustment and management of other part of cardiac pacemaker: Secondary | ICD-10-CM | POA: Diagnosis not present

## 2023-06-28 DIAGNOSIS — Z950 Presence of cardiac pacemaker: Secondary | ICD-10-CM | POA: Diagnosis not present

## 2023-06-28 DIAGNOSIS — I5020 Unspecified systolic (congestive) heart failure: Secondary | ICD-10-CM | POA: Diagnosis not present

## 2023-06-28 DIAGNOSIS — Z79899 Other long term (current) drug therapy: Secondary | ICD-10-CM | POA: Diagnosis not present

## 2023-07-05 DIAGNOSIS — I442 Atrioventricular block, complete: Secondary | ICD-10-CM | POA: Diagnosis not present

## 2023-07-05 DIAGNOSIS — Z45018 Encounter for adjustment and management of other part of cardiac pacemaker: Secondary | ICD-10-CM | POA: Diagnosis not present

## 2023-07-06 DIAGNOSIS — Z45018 Encounter for adjustment and management of other part of cardiac pacemaker: Secondary | ICD-10-CM | POA: Diagnosis not present

## 2023-07-06 DIAGNOSIS — I442 Atrioventricular block, complete: Secondary | ICD-10-CM | POA: Diagnosis not present

## 2023-07-10 ENCOUNTER — Encounter: Payer: Self-pay | Admitting: Endocrinology

## 2023-07-10 ENCOUNTER — Ambulatory Visit (INDEPENDENT_AMBULATORY_CARE_PROVIDER_SITE_OTHER): Payer: Medicare Other | Admitting: Endocrinology

## 2023-07-10 DIAGNOSIS — E118 Type 2 diabetes mellitus with unspecified complications: Secondary | ICD-10-CM

## 2023-07-10 DIAGNOSIS — Z7984 Long term (current) use of oral hypoglycemic drugs: Secondary | ICD-10-CM | POA: Diagnosis not present

## 2023-07-10 MED ORDER — METFORMIN HCL 500 MG PO TABS: ORAL_TABLET | ORAL | 3 refills | Status: DC

## 2023-07-10 MED ORDER — EMPAGLIFLOZIN 10 MG PO TABS: 10.0000 mg | ORAL_TABLET | Freq: Every day | ORAL | 3 refills | Status: DC

## 2023-07-10 NOTE — Patient Instructions (Addendum)
Metformin 500 mg daily with evening meal only. Jardiance 10 mg daily in the morning.

## 2023-07-10 NOTE — Progress Notes (Signed)
Outpatient Endocrinology Note [place] Pierre Dellarocco, MD  07/10/23  Patient's Name: Bridget [name]    DOB: 1940/08/31    MRN: [id number]                                                    REASON OF VISIT: Follow up for type 2 diabetes mellitus  PCP: Blane Ohara, MD  HISTORY OF PRESENT ILLNESS:   Bridget [name] is a 82 y.o. old female with past medical history listed below, is here for follow up for type 2 diabetes mellitus.   Pertinent Diabetes History: Patient was diagnosed with type 2 diabetes mellitus in 2007.  Patient has controlled type 2 diabetes mellitus.  Chronic Diabetes Complications : Retinopathy: no. Last ophthalmology exam was done on annually, following with ophthalmology regularly.  Nephropathy: yes, CKD IIIb, on ACE/ARB / valsartan Peripheral neuropathy: no Coronary artery disease: yes, CHF present Stroke: no  Relevant comorbidities and cardiovascular risk factors: Obesity: no Body mass index is 16.89 kg/m.  Hypertension: Yes  Hyperlipidemia : Yes, on statin   Current / Home Diabetic regimen includes:  Metformin 500 mg twice daily. Jardiance 10 mg daily.   Prior diabetic medications: Actos/pioglitazone was discontinued due to history of CHF.  Glycemic data:    Blood sugar log reviewed: -glucose log 81 - 120 range, mostly low 100s, checking in the morning and fasting.   Hypoglycemia: Patient has on hypoglycemic episodes. Patient has hypoglycemia awareness.  Factors modifying glucose control: 1.  Diabetic diet assessment: 3 meals a day.  2.  Staying active or exercising: No formal exercise.  She cannot exercise.  3.  Medication compliance: compliant all of the time.  Interval history  Recent Hemoglobin A1C 6.8% in 06/08/2023 in outside facility.  No numbness and tingling of the feet.  No complaints today.  Patient is accompanied by her husband in the clinic today.  REVIEW OF SYSTEMS As per history of present illness.   PAST MEDICAL  HISTORY: Past Medical History:  Diagnosis Date   Anemia    Anxiety and depression    Arthritis    HANDS,ARMS,FEET   Asthma    Atrioventricular block    CAD (coronary artery disease)    CHF (congestive heart failure) (HCC)    Chronic renal insufficiency    Clotting disorder (HCC)    Complication of anesthesia    Difficulty waking up once.   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 Otherwise normal colonoscopy   ESOPHAGOGASTRODUODENOSCOPY  11/16/2005   [facility]. Patchy areas of mucosal erythema in the antrum compatible with gastritis. Polyps in the stomach. Nodule in the antrum. Medium hiatal hernia   ESOPHAGOGASTRODUODENOSCOPY (EGD) WITH PROPOFOL N/A 04/13/2022   Procedure: ESOPHAGOGASTRODUODENOSCOPY (EGD) WITH PROPOFOL;  Surgeon: Lynann Bologna, MD;  Location: [facility];  Service: Gastroenterology;  Laterality: N/A;   MALONEY DILATION  04/13/2022   Procedure: Elease Hashimoto  DILATION;  Surgeon: Lynann Bologna, MD;  Location: [facility];  Service: Gastroenterology;;   pacemaker  09/27/2016   PACEMAKER REVISION  09/27/2020   PACEMAKER UPGRADE DUAL CHAMBER TO BIV    ALLERGIES: Allergies  Allergen Reactions   Atorvastatin Other (See Comments)    "muscle Cramps"   Budesonide Other (See Comments)    "fatigue"   Gabapentin Other (See Comments)    "fatigue"   Metoclopramide Other (See Comments)    "mayalgia"   Rosuvastatin Other (See Comments)    "muscle cramps"   Simvastatin Other (See Comments)    "muscle Cramps"   Wellbutrin [Bupropion]     Gi upset.    Sulfamethoxazole-Trimethoprim Rash    FAMILY HISTORY:  Family History  Problem Relation Age of Onset   High blood pressure Mother    Diabetes Mother        no medication needed   Kidney disease Mother    Pneumonia Mother    Colon polyps Father    High blood pressure Father    Colon cancer Father    Cancer Maternal Grandmother        Leukemia   Cancer Other        Ovarian   Rectal cancer Neg Hx    Stomach cancer Neg Hx    Esophageal cancer Neg Hx    Liver cancer Neg Hx    Pancreatic cancer Neg Hx    Crohn's disease Neg Hx     SOCIAL HISTORY: Social History   Socioeconomic History   Marital status: Married    Spouse name: Darryl   Number of children: 3   Years of education: Not on file   Highest education level: 9th grade  Occupational History   Occupation: Retired  Tobacco Use   Smoking status: Never    Passive exposure: Past   Smokeless tobacco: Never  Vaping Use   Vaping status: Never Used  Substance and Sexual Activity   Alcohol use: Never   Drug use: Never   Sexual activity: Not on file  Other Topics Concern   Not on file  Social History Narrative   Lives at home with her husband   Right handed   Caffeine: mostly tea, 0-2 cups a day   Social Determinants of Health   Financial Resource Strain: Low Risk  (04/23/2023)   Overall Financial Resource Strain (CARDIA)     Difficulty of Paying Living Expenses: Not hard at all  Recent Concern: Financial Resource Strain - High Risk (01/23/2023)   Overall Financial Resource Strain (CARDIA)    Difficulty of Paying Living Expenses: Hard  Food Insecurity: Low Risk  (06/08/2023)   Received from [facility]   Hunger Vital Sign    Worried About Running Out of Food in  the Last Year: Never true    Ran Out of Food in the Last Year: Never true  Transportation Needs: No Transportation Needs (06/08/2023)   Received from [facility]    In the past 12 months, has lack of reliable transportation kept you from medical appointments, meetings, work or from getting things needed for daily living? : No  Physical Activity: Insufficiently Active (04/23/2023)   Exercise Vital Sign    Days of Exercise per Week: 7 days    Minutes of Exercise per Session: 10 min  Stress: No Stress Concern Present (04/23/2023)   [employer] of Occupational Health - Occupational Stress Questionnaire    Feeling of Stress : Only a little  Social Connections: Socially Integrated (04/23/2023)   Social Connection and Isolation Panel [NHANES]    Frequency of Communication with Friends and Family: More than three times a week    Frequency of Social Gatherings with Friends and Family: Once a week    Attends Religious Services: More than 4 times per year    Active Member of [employer] or Organizations: Yes    Attends Engineer, structural: More than 4 times per year    Marital Status: Married    MEDICATIONS:  Current Outpatient Medications  Medication Sig Dispense Refill   aspirin 81 MG chewable tablet Chew 81 mg by mouth in the morning.      Blood Glucose Monitoring Suppl (ONETOUCH VERIO FLEX SYSTEM) w/Device KIT Check sugar once daily 1 kit 0   Budeson-Glycopyrrol-Formoterol (BREZTRI AEROSPHERE) 160-9-4.8 MCG/ACT AERO Inhale 2 puffs into the lungs 2 (two) times daily. 32.1 g 3   Cholecalciferol (VITAMIN D-3) 1000 UNITS CAPS Take  1,000 Units by mouth daily.     furosemide (LASIX) 20 MG tablet Take 20 mg by mouth every other day.     glucose blood (ONETOUCH VERIO) test strip USE 1 STRIP TO CHECK GLUCOSE ONCE DAILY 100 strip 12   Lancets (ONETOUCH DELICA PLUS LANCET33G) MISC USE 1  TO CHECK GLUCOSE ONCE DAILY 100 each 12   lovastatin (MEVACOR) 40 MG tablet TAKE 1 TABLET BY MOUTH AT BEDTIME 90 tablet 0   Magnesium 250 MG TABS Take 1 tablet (250 mg total) by mouth in the morning and at bedtime. 60 tablet 0   mirtazapine (REMERON) 15 MG tablet Take 1 tablet (15 mg total) by mouth at bedtime. 30 tablet 1   Multiple Vitamins-Minerals (CENTRUM SILVER ULTRA WOMENS PO) Take 1 tablet by mouth daily.     nitroGLYCERIN (NITROSTAT) 0.4 MG SL tablet DISSOLVE ONE TABLET UNDER THE TONGUE EVERY 5 MINUTES AS NEEDED FOR CHEST PAIN.  DO NOT EXCEED A TOTAL OF 3 DOSES IN 15 MINUTES 25 tablet 0   Polyethyl Glycol-Propyl Glycol (SYSTANE ULTRA) 0.4-0.3 % SOLN Place 1 drop into both eyes as needed (dry eyes).     sacubitril-valsartan (ENTRESTO) 49-51 MG Take 1 tablet by mouth 2 (two) times daily. 60 tablet 2   venlafaxine XR (EFFEXOR-XR) 75 MG 24 hr capsule TAKE 2 CAPSULES BY MOUTH ONCE DAILY WITH BREAKFAST 180 capsule 0   empagliflozin (JARDIANCE) 10 MG TABS tablet Take 1 tablet (10 mg total) by mouth daily with breakfast. 90 tablet 3   metFORMIN (GLUCOPHAGE) 500 MG tablet TAKE 1 TABLET BY MOUTH WITH EVENING MEAL 90 tablet 3   No current facility-administered medications for this visit.    PHYSICAL EXAM: Vitals:   07/10/23 0812  BP: 110/70  Pulse: 93  Resp: 20  SpO2: 95%  Weight: 89 lb 6.4 oz (40.6 kg)  Height: 5\' 1"  (1.549 m)   Body mass index is 16.89 kg/m.  Wt Readings from Last 3 Encounters:  07/10/23 89 lb 6.4 oz (40.6 kg)  06/27/23 88 lb 3.2 oz (40 kg)  04/23/23 89 lb (40.4 kg)    General: Well developed, well nourished female in no apparent distress.  HEENT: AT/[place], no external lesions.  Eyes: Conjunctiva clear and no  icterus. Neck: Neck supple  Lungs: Respirations not labored Neurologic: Alert, oriented, normal speech Extremities / Skin: Dry. No sores or rashes noted. Psychiatric: Does not appear depressed or anxious  Diabetic Foot Exam - Simple   No data filed    LABS Reviewed Lab Results  Component Value Date   HGBA1C 6.4 (A) 03/13/2023   HGBA1C 6.7 11/11/2022   HGBA1C 7.4 05/26/2022   No results found for: "FRUCTOSAMINE" Lab Results  Component Value Date   CHOL 167 01/18/2023   HDL 80 01/18/2023   LDLCALC 68 01/18/2023   LDLDIRECT 94.2 12/05/2013   TRIG 110 01/18/2023   CHOLHDL 2.1 01/18/2023   Lab Results  Component Value Date   MICRALBCREAT 1.2 03/13/2023   MICRALBCREAT 11 12/19/2021   Lab Results  Component Value Date   CREATININE 1.60 (H) 06/27/2023   Lab Results  Component Value Date   GFR 32.91 (L) 03/23/2020    ASSESSMENT / PLAN  1. Controlled type 2 diabetes mellitus with complication, without long-term current use of insulin (HCC)     Diabetes Mellitus type 2, complicated by CAD - Diabetic status / severity: Controlled  Lab Results  Component Value Date   HGBA1C 6.4 (A) 03/13/2023    - Hemoglobin A1c goal : <7%  Recent hemoglobin A1c of 6.8% on June 08, 2023 in outside facility.  - Medications: See below.  I) decrease metformin 500 mg 2 times a day to 1 time a day with supper.  Due to CKD. II) continue Jardiance 10 mg daily.  - Home glucose testing: In the morning fasting daily. - Discussed/ Gave Hypoglycemia treatment plan.  # Consult : not required at this time.   # Annual urine for microalbuminuria/ creatinine ratio, no microalbuminuria currently, continue ACE/ARB Rennis Petty, following with nephrology.  She has CKD 3B. Last  Lab Results  Component Value Date   MICRALBCREAT 1.2 03/13/2023    # Foot check nightly.  # Annual dilated diabetic eye exams.   - Diet: Make healthy diabetic food choices.  2. Blood pressure  -  BP Readings  from Last 1 Encounters:  07/10/23 110/70    - Control is in target.  - No change in current plans.  3. Lipid status / Hyperlipidemia - Last  Lab Results  Component Value Date   LDLCALC 68 01/18/2023   - Continue lovastatin 40 mg daily.  Managed by primary care provider.  Diagnoses and all orders for this visit:  Controlled type 2 diabetes mellitus with complication, without long-term current use of insulin (HCC) -     metFORMIN (GLUCOPHAGE) 500 MG tablet; TAKE 1 TABLET BY MOUTH WITH EVENING MEAL  Other orders -     empagliflozin (JARDIANCE) 10 MG TABS tablet; Take 1 tablet (10 mg total) by mouth daily with breakfast.    DISPOSITION Follow up in clinic in 4 months suggested.   All questions answered and patient verbalized understanding of the plan.  [place]raq Kimmerly Lora, MD [facility] [address] [place], [address] [place], [place] [address] Phone #  [phone]  At least part of this note was generated using voice recognition software. Inadvertent word errors may have occurred, which were not recognized during the proofreading process.

## 2023-07-11 DIAGNOSIS — I5023 Acute on chronic systolic (congestive) heart failure: Secondary | ICD-10-CM | POA: Diagnosis not present

## 2023-07-11 DIAGNOSIS — I340 Nonrheumatic mitral (valve) insufficiency: Secondary | ICD-10-CM | POA: Diagnosis not present

## 2023-07-11 DIAGNOSIS — E119 Type 2 diabetes mellitus without complications: Secondary | ICD-10-CM | POA: Diagnosis not present

## 2023-07-11 DIAGNOSIS — Z86718 Personal history of other venous thrombosis and embolism: Secondary | ICD-10-CM | POA: Diagnosis not present

## 2023-07-11 DIAGNOSIS — I442 Atrioventricular block, complete: Secondary | ICD-10-CM | POA: Diagnosis not present

## 2023-07-11 DIAGNOSIS — E782 Mixed hyperlipidemia: Secondary | ICD-10-CM | POA: Diagnosis not present

## 2023-07-11 DIAGNOSIS — I110 Hypertensive heart disease with heart failure: Secondary | ICD-10-CM | POA: Diagnosis not present

## 2023-07-11 DIAGNOSIS — Z09 Encounter for follow-up examination after completed treatment for conditions other than malignant neoplasm: Secondary | ICD-10-CM | POA: Diagnosis not present

## 2023-07-11 DIAGNOSIS — Z950 Presence of cardiac pacemaker: Secondary | ICD-10-CM | POA: Diagnosis not present

## 2023-07-13 ENCOUNTER — Ambulatory Visit: Payer: Medicare Other | Admitting: Endocrinology

## 2023-07-23 DIAGNOSIS — I5020 Unspecified systolic (congestive) heart failure: Secondary | ICD-10-CM | POA: Diagnosis not present

## 2023-07-24 DIAGNOSIS — R059 Cough, unspecified: Secondary | ICD-10-CM | POA: Diagnosis not present

## 2023-07-24 DIAGNOSIS — Z950 Presence of cardiac pacemaker: Secondary | ICD-10-CM | POA: Diagnosis not present

## 2023-07-24 DIAGNOSIS — J9811 Atelectasis: Secondary | ICD-10-CM | POA: Diagnosis not present

## 2023-07-24 DIAGNOSIS — R0609 Other forms of dyspnea: Secondary | ICD-10-CM | POA: Diagnosis not present

## 2023-07-24 DIAGNOSIS — J90 Pleural effusion, not elsewhere classified: Secondary | ICD-10-CM | POA: Diagnosis not present

## 2023-07-25 DIAGNOSIS — N179 Acute kidney failure, unspecified: Secondary | ICD-10-CM | POA: Diagnosis not present

## 2023-07-25 DIAGNOSIS — I517 Cardiomegaly: Secondary | ICD-10-CM | POA: Diagnosis not present

## 2023-07-25 DIAGNOSIS — Z45018 Encounter for adjustment and management of other part of cardiac pacemaker: Secondary | ICD-10-CM | POA: Diagnosis not present

## 2023-07-25 DIAGNOSIS — E119 Type 2 diabetes mellitus without complications: Secondary | ICD-10-CM | POA: Diagnosis not present

## 2023-07-25 DIAGNOSIS — I442 Atrioventricular block, complete: Secondary | ICD-10-CM | POA: Diagnosis not present

## 2023-07-25 DIAGNOSIS — Z7984 Long term (current) use of oral hypoglycemic drugs: Secondary | ICD-10-CM | POA: Diagnosis not present

## 2023-07-25 DIAGNOSIS — J90 Pleural effusion, not elsewhere classified: Secondary | ICD-10-CM | POA: Diagnosis not present

## 2023-07-25 DIAGNOSIS — N1832 Chronic kidney disease, stage 3b: Secondary | ICD-10-CM | POA: Diagnosis not present

## 2023-07-25 DIAGNOSIS — I110 Hypertensive heart disease with heart failure: Secondary | ICD-10-CM | POA: Diagnosis not present

## 2023-07-25 DIAGNOSIS — I21A1 Myocardial infarction type 2: Secondary | ICD-10-CM | POA: Diagnosis not present

## 2023-07-25 DIAGNOSIS — J159 Unspecified bacterial pneumonia: Secondary | ICD-10-CM | POA: Diagnosis not present

## 2023-07-25 DIAGNOSIS — I214 Non-ST elevation (NSTEMI) myocardial infarction: Secondary | ICD-10-CM | POA: Diagnosis not present

## 2023-07-25 DIAGNOSIS — Z8673 Personal history of transient ischemic attack (TIA), and cerebral infarction without residual deficits: Secondary | ICD-10-CM | POA: Diagnosis not present

## 2023-07-25 DIAGNOSIS — I3139 Other pericardial effusion (noninflammatory): Secondary | ICD-10-CM | POA: Diagnosis not present

## 2023-07-25 DIAGNOSIS — Z950 Presence of cardiac pacemaker: Secondary | ICD-10-CM | POA: Diagnosis not present

## 2023-07-25 DIAGNOSIS — J440 Chronic obstructive pulmonary disease with acute lower respiratory infection: Secondary | ICD-10-CM | POA: Diagnosis not present

## 2023-07-25 DIAGNOSIS — E88A Wasting disease (syndrome) due to underlying condition: Secondary | ICD-10-CM | POA: Diagnosis not present

## 2023-07-25 DIAGNOSIS — J9811 Atelectasis: Secondary | ICD-10-CM | POA: Diagnosis not present

## 2023-07-25 DIAGNOSIS — E1122 Type 2 diabetes mellitus with diabetic chronic kidney disease: Secondary | ICD-10-CM | POA: Diagnosis not present

## 2023-07-25 DIAGNOSIS — I5023 Acute on chronic systolic (congestive) heart failure: Secondary | ICD-10-CM | POA: Diagnosis not present

## 2023-07-25 DIAGNOSIS — Z79899 Other long term (current) drug therapy: Secondary | ICD-10-CM | POA: Diagnosis not present

## 2023-07-25 DIAGNOSIS — R0602 Shortness of breath: Secondary | ICD-10-CM | POA: Diagnosis not present

## 2023-07-25 DIAGNOSIS — N1831 Chronic kidney disease, stage 3a: Secondary | ICD-10-CM | POA: Diagnosis not present

## 2023-07-25 DIAGNOSIS — I420 Dilated cardiomyopathy: Secondary | ICD-10-CM | POA: Diagnosis not present

## 2023-07-25 DIAGNOSIS — I130 Hypertensive heart and chronic kidney disease with heart failure and stage 1 through stage 4 chronic kidney disease, or unspecified chronic kidney disease: Secondary | ICD-10-CM | POA: Diagnosis not present

## 2023-07-25 DIAGNOSIS — R079 Chest pain, unspecified: Secondary | ICD-10-CM | POA: Diagnosis not present

## 2023-07-25 DIAGNOSIS — Z95810 Presence of automatic (implantable) cardiac defibrillator: Secondary | ICD-10-CM | POA: Diagnosis not present

## 2023-07-25 DIAGNOSIS — I5084 End stage heart failure: Secondary | ICD-10-CM | POA: Diagnosis not present

## 2023-07-25 DIAGNOSIS — I083 Combined rheumatic disorders of mitral, aortic and tricuspid valves: Secondary | ICD-10-CM | POA: Diagnosis not present

## 2023-07-25 DIAGNOSIS — I509 Heart failure, unspecified: Secondary | ICD-10-CM | POA: Diagnosis not present

## 2023-07-25 DIAGNOSIS — I272 Pulmonary hypertension, unspecified: Secondary | ICD-10-CM | POA: Diagnosis not present

## 2023-07-25 DIAGNOSIS — I080 Rheumatic disorders of both mitral and aortic valves: Secondary | ICD-10-CM | POA: Diagnosis not present

## 2023-07-25 DIAGNOSIS — I493 Ventricular premature depolarization: Secondary | ICD-10-CM | POA: Diagnosis not present

## 2023-07-25 DIAGNOSIS — I340 Nonrheumatic mitral (valve) insufficiency: Secondary | ICD-10-CM | POA: Diagnosis not present

## 2023-07-25 DIAGNOSIS — Z7982 Long term (current) use of aspirin: Secondary | ICD-10-CM | POA: Diagnosis not present

## 2023-07-25 DIAGNOSIS — J918 Pleural effusion in other conditions classified elsewhere: Secondary | ICD-10-CM | POA: Diagnosis not present

## 2023-07-25 DIAGNOSIS — R0989 Other specified symptoms and signs involving the circulatory and respiratory systems: Secondary | ICD-10-CM | POA: Diagnosis not present

## 2023-07-25 DIAGNOSIS — J441 Chronic obstructive pulmonary disease with (acute) exacerbation: Secondary | ICD-10-CM | POA: Diagnosis not present

## 2023-07-25 DIAGNOSIS — J9601 Acute respiratory failure with hypoxia: Secondary | ICD-10-CM | POA: Diagnosis not present

## 2023-07-25 DIAGNOSIS — J439 Emphysema, unspecified: Secondary | ICD-10-CM | POA: Diagnosis not present

## 2023-07-26 DIAGNOSIS — J9601 Acute respiratory failure with hypoxia: Secondary | ICD-10-CM | POA: Diagnosis not present

## 2023-07-26 DIAGNOSIS — R0602 Shortness of breath: Secondary | ICD-10-CM | POA: Diagnosis not present

## 2023-07-26 DIAGNOSIS — Z950 Presence of cardiac pacemaker: Secondary | ICD-10-CM | POA: Diagnosis not present

## 2023-07-26 DIAGNOSIS — J90 Pleural effusion, not elsewhere classified: Secondary | ICD-10-CM | POA: Diagnosis not present

## 2023-07-27 DIAGNOSIS — I517 Cardiomegaly: Secondary | ICD-10-CM | POA: Diagnosis not present

## 2023-07-27 DIAGNOSIS — J9601 Acute respiratory failure with hypoxia: Secondary | ICD-10-CM | POA: Diagnosis not present

## 2023-07-27 DIAGNOSIS — J90 Pleural effusion, not elsewhere classified: Secondary | ICD-10-CM | POA: Diagnosis not present

## 2023-07-27 DIAGNOSIS — I509 Heart failure, unspecified: Secondary | ICD-10-CM | POA: Diagnosis not present

## 2023-07-27 DIAGNOSIS — Z950 Presence of cardiac pacemaker: Secondary | ICD-10-CM | POA: Diagnosis not present

## 2023-07-28 DIAGNOSIS — J9601 Acute respiratory failure with hypoxia: Secondary | ICD-10-CM | POA: Diagnosis not present

## 2023-07-29 DIAGNOSIS — J9601 Acute respiratory failure with hypoxia: Secondary | ICD-10-CM | POA: Diagnosis not present

## 2023-07-30 DIAGNOSIS — J9601 Acute respiratory failure with hypoxia: Secondary | ICD-10-CM | POA: Diagnosis not present

## 2023-07-31 NOTE — Progress Notes (Deleted)
Subjective:  Patient ID: Bridget [name], female    DOB: [birth year]  Age: 82 y.o. MRN: [id number]  Chief Complaint  Patient presents with   Medical Management of Chronic Issues    HPI   Diabetes:  Complications: CKD Most recent A1C: 6.8 Checking sugars daily. 85-177 Current medications: Metformin 500 mg take 1 tablet twice daily. Jardiance 10 mg daily, and gabapentin 100 mg before bed.  Last Eye Exam:01/12/2021. Foot checks: daily Dr. Lucianne Muss endocrinologist.   Hyperlipidemia: Current medications:Lovastatin 40 mg take 1 tablet daily  Hypertension with chf: Current medications: furosemide 20 mg daily, entresto 49/51 mg twice daily, digoxin 0.125 mg 1/2 every other day, carvedilol 12.5 mg 1/4 twice daily, baby aspirin 81 mg daily, Ntg 0.4 mg SL every 5 minutes as needed chest pain. Patient sees Dr. Judithe Modest. Dr. Judithe Modest discontinue xarelto in 06/2022.  Hypomagnesemia: Mag 250 mg twice daily.   Depression: Patient is currently taking Venlafaxine XR 75 mg take 1 capsule daily. Doing well .  GERD: on omeprazole 20 mg daily. Having dysphagia occasionally. Has had a full evaluation.   History of DVT: xarelto 15 mg but this has been discontinued  Mild Persistent Asthma: Patient is currently taking Breztri 2 puffs twice daily  Diet: good Exercise: minimal  Insomnia: trouble falling asleep. Takes naps.      04/23/2023    8:09 AM 03/09/2023    9:16 AM 01/18/2023    7:52 AM 11/01/2022    9:47 AM 10/04/2022    7:56 AM  Depression screen PHQ 2/9  Decreased Interest 1 0 0 0 0  Down, Depressed, Hopeless 1 0 1 0 1  PHQ - 2 Score 2 0 1 0 1  Altered sleeping 2  1  1   Tired, decreased energy 3  1  3   Change in appetite 1  1  1   Feeling bad or failure about yourself  1  0  1  Trouble concentrating 0  1  2  Moving slowly or fidgety/restless 1  0  0  Suicidal thoughts 0  0  0  PHQ-9 Score 10  5  9   Difficult doing work/chores Somewhat difficult  Not difficult at all  Not difficult at all         04/23/2023    8:09 AM  Fall Risk   Falls in the past year? 1  Number falls in past yr: 1  Injury with Fall? 0  Risk for fall due to : History of fall(s)  Follow up Falls evaluation completed    Patient Care Team: Blane Ohara, MD as PCP - General (Family Medicine) Reather Littler, MD (Inactive) as Consulting Physician (Endocrinology) Weston Settle, MD as Consulting Physician (Oncology) Diamond Nickel., MD as Referring Physician (Cardiology) Lynann Bologna, MD as Consulting Physician (Gastroenterology) [facility] (Ophthalmology) Zettie Pho, [facility] (Inactive) (Pharmacist) Diamond Nickel., MD as Referring Physician (Cardiology) Ninfa Meeker, FNP (Inactive) (Family Medicine) Ronda Fairly, MD (Infectious Diseases)   Review of Systems  Current Outpatient Medications on File Prior to Visit  Medication Sig Dispense Refill   aspirin 81 MG chewable tablet Chew 81 mg by mouth in the morning.      Blood Glucose Monitoring Suppl (ONETOUCH VERIO FLEX SYSTEM) w/Device KIT Check sugar once daily 1 kit 0   Budeson-Glycopyrrol-Formoterol (BREZTRI AEROSPHERE) 160-9-4.8 MCG/ACT AERO Inhale 2 puffs into the lungs 2 (two) times daily. 32.1 g 3   Cholecalciferol (VITAMIN D-3) 1000 UNITS CAPS Take 1,000 Units by mouth  daily.     empagliflozin (JARDIANCE) 10 MG TABS tablet Take 1 tablet (10 mg total) by mouth daily with breakfast. 90 tablet 3   furosemide (LASIX) 20 MG tablet Take 20 mg by mouth every other day.     glucose blood (ONETOUCH VERIO) test strip USE 1 STRIP TO CHECK GLUCOSE ONCE DAILY 100 strip 12   Lancets (ONETOUCH DELICA PLUS LANCET33G) MISC USE 1  TO CHECK GLUCOSE ONCE DAILY 100 each 12   lovastatin (MEVACOR) 40 MG tablet TAKE 1 TABLET BY MOUTH AT BEDTIME 90 tablet 0   Magnesium 250 MG TABS Take 1 tablet (250 mg total) by mouth in the morning and at bedtime. 60 tablet 0   metFORMIN (GLUCOPHAGE) 500 MG tablet TAKE 1 TABLET BY MOUTH WITH EVENING MEAL 90 tablet  3   mirtazapine (REMERON) 15 MG tablet Take 1 tablet (15 mg total) by mouth at bedtime. 30 tablet 1   Multiple Vitamins-Minerals (CENTRUM SILVER ULTRA WOMENS PO) Take 1 tablet by mouth daily.     nitroGLYCERIN (NITROSTAT) 0.4 MG SL tablet DISSOLVE ONE TABLET UNDER THE TONGUE EVERY 5 MINUTES AS NEEDED FOR CHEST PAIN.  DO NOT EXCEED A TOTAL OF 3 DOSES IN 15 MINUTES 25 tablet 0   Polyethyl Glycol-Propyl Glycol (SYSTANE ULTRA) 0.4-0.3 % SOLN Place 1 drop into both eyes as needed (dry eyes).     sacubitril-valsartan (ENTRESTO) 49-51 MG Take 1 tablet by mouth 2 (two) times daily. 60 tablet 2   venlafaxine XR (EFFEXOR-XR) 75 MG 24 hr capsule TAKE 2 CAPSULES BY MOUTH ONCE DAILY WITH BREAKFAST 180 capsule 0   No current facility-administered medications on file prior to visit.   Past Medical History:  Diagnosis Date   Anemia    Anxiety and depression    Arthritis    HANDS,ARMS,FEET   Asthma    Atrioventricular block    CAD (coronary artery disease)    CHF (congestive heart failure) (HCC)    Chronic renal insufficiency    Clotting disorder (HCC)    Complication of anesthesia    Difficulty waking up once.   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 Otherwise normal colonoscopy   ESOPHAGOGASTRODUODENOSCOPY  11/16/2005   [facility]. Patchy areas of mucosal erythema in the antrum compatible with gastritis. Polyps in the stomach. Nodule in  the antrum. Medium hiatal hernia   ESOPHAGOGASTRODUODENOSCOPY (EGD) WITH PROPOFOL N/A 04/13/2022   Procedure: ESOPHAGOGASTRODUODENOSCOPY (EGD) WITH PROPOFOL;  Surgeon: Lynann Bologna, MD;  Location: [facility];  Service: Gastroenterology;  Laterality: N/A;   MALONEY DILATION  04/13/2022   Procedure: Elease Hashimoto DILATION;  Surgeon: Lynann Bologna, MD;  Location: [facility];  Service: Gastroenterology;;   pacemaker  09/27/2016   PACEMAKER REVISION  09/27/2020   PACEMAKER UPGRADE DUAL CHAMBER TO BIV    Family History  Problem Relation Age of Onset   High blood pressure Mother    Diabetes Mother        no medication needed   Kidney disease Mother    Pneumonia Mother    Colon polyps Father    High blood pressure Father    Colon cancer Father    Cancer Maternal Grandmother        Leukemia   Cancer Other        Ovarian   Rectal cancer Neg Hx    Stomach cancer Neg Hx    Esophageal cancer Neg Hx    Liver cancer Neg Hx    Pancreatic cancer Neg Hx    Crohn's disease Neg Hx    Social History   Socioeconomic History   Marital status: Married    Spouse name: Darryl   Number of children: 3   Years of education: Not on file   Highest education level: 9th grade  Occupational History   Occupation: Retired  Tobacco Use   Smoking status: Never    Passive exposure: Past   Smokeless tobacco: Never  Vaping Use   Vaping status: Never Used  Substance and Sexual Activity   Alcohol use: Never   Drug use: Never   Sexual activity: Not on file  Other Topics Concern    Not on file  Social History Narrative   Lives at home with her husband   Right handed   Caffeine: mostly tea, 0-2 cups a day   Social Drivers of Corporate investment banker Strain: Low Risk  (04/23/2023)   Overall Financial Resource Strain (CARDIA)    Difficulty of Paying Living Expenses: Not hard at all  Recent Concern: Financial Resource Strain - High Risk (01/23/2023)   Overall Financial Resource Strain (CARDIA)    Difficulty of Paying Living Expenses: Hard  Food Insecurity: Low Risk  (07/29/2023)   Received from [facility]   Hunger Vital Sign    Worried About Running Out of Food in the Last Year: Never true    Ran Out of Food in the Last Year: Never true  Transportation Needs: No Transportation Needs (07/29/2023)   Received from [facility]    In the past 12 months, has lack of reliable transportation kept you from medical appointments, meetings, work or from getting things needed for daily living? : No  Physical Activity: Insufficiently Active (04/23/2023)   Exercise Vital Sign    Days of Exercise per Week: 7 days    Minutes of Exercise per Session: 10 min  Stress: No Stress Concern Present (04/23/2023)   [employer] of Occupational Health - Occupational Stress Questionnaire    Feeling of Stress : Only a little  Social Connections: Socially Integrated (04/23/2023)   Social Connection and Isolation Panel [NHANES]    Frequency of Communication with Friends and Family: More than three times a week    Frequency of Social Gatherings with Friends and Family: Once a week    Attends Religious Services: More  than 4 times per year    Active Member of Clubs or Organizations: Yes    Attends Banker Meetings: More than 4 times per year    Marital Status: Married    Objective:  There were no vitals taken for this visit.     07/10/2023    8:12 AM 06/27/2023    9:45 AM 04/23/2023    8:01 AM  BP/Weight  Systolic BP 110 139 106  Diastolic BP 70  105 60  Wt. (Lbs) 89.4 88.2 89  BMI 16.89 kg/m2 16.67 kg/m2 16.82 kg/m2    Physical Exam  Diabetic Foot Exam - Simple   No data filed      Lab Results  Component Value Date   WBC 7.7 06/27/2023   HGB 12.9 06/27/2023   HCT 39.5 06/27/2023   PLT 173 06/27/2023   GLUCOSE 169 (H) 06/27/2023   CHOL 167 01/18/2023   TRIG 110 01/18/2023   HDL 80 01/18/2023   LDLDIRECT 94.2 12/05/2013   LDLCALC 68 01/18/2023   ALT 19 06/27/2023   AST 27 06/27/2023   NA 137 06/27/2023   K 4.3 06/27/2023   CL 98 06/27/2023   CREATININE 1.60 (H) 06/27/2023   BUN 52 (H) 06/27/2023   CO2 25 06/27/2023   TSH 1.220 04/20/2022   INR 1.3 (H) 11/12/2020   HGBA1C 6.4 (A) 03/13/2023   MICROALBUR 0.8 03/13/2023      Assessment & Plan:    There are no diagnoses linked to this encounter.   No orders of the defined types were placed in this encounter.   No orders of the defined types were placed in this encounter.    Follow-up: No follow-ups on file.   I,Tameron Lama I Leal-Borjas,acting as a scribe for Blane Ohara, MD.,have documented all relevant documentation on the behalf of Blane Ohara, MD,as directed by  Blane Ohara, MD while in the presence of Blane Ohara, MD.   An After Visit Summary was printed and given to the patient.  Blane Ohara, MD [facility] [phone]

## 2023-08-01 ENCOUNTER — Encounter (INDEPENDENT_AMBULATORY_CARE_PROVIDER_SITE_OTHER): Payer: Medicare Other | Admitting: Family Medicine

## 2023-08-01 DIAGNOSIS — M810 Age-related osteoporosis without current pathological fracture: Secondary | ICD-10-CM

## 2023-08-04 NOTE — Assessment & Plan Note (Signed)
Management per specialist. 

## 2023-08-04 NOTE — Progress Notes (Signed)
Cancelled. Dr. Cox  

## 2023-08-06 DIAGNOSIS — I3139 Other pericardial effusion (noninflammatory): Secondary | ICD-10-CM | POA: Diagnosis not present

## 2023-08-06 DIAGNOSIS — I083 Combined rheumatic disorders of mitral, aortic and tricuspid valves: Secondary | ICD-10-CM | POA: Diagnosis not present

## 2023-08-09 ENCOUNTER — Ambulatory Visit: Payer: Medicare Other

## 2023-08-09 VITALS — BP 80/56 | HR 80 | Temp 97.6°F | Resp 16 | Ht 61.0 in | Wt 84.8 lb

## 2023-08-09 DIAGNOSIS — I5020 Unspecified systolic (congestive) heart failure: Secondary | ICD-10-CM | POA: Diagnosis not present

## 2023-08-09 DIAGNOSIS — Z09 Encounter for follow-up examination after completed treatment for conditions other than malignant neoplasm: Secondary | ICD-10-CM | POA: Diagnosis not present

## 2023-08-09 DIAGNOSIS — Z7409 Other reduced mobility: Secondary | ICD-10-CM | POA: Insufficient documentation

## 2023-08-09 DIAGNOSIS — R5383 Other fatigue: Secondary | ICD-10-CM | POA: Diagnosis not present

## 2023-08-09 NOTE — Assessment & Plan Note (Signed)
Admitted to [facility] from December 13 till December 16 for exacerbation of heart failure. Her Lasix dose was increased She has been feeling reasonably well except for fatigue She has an appointment with her cardiologist in January. No change in medications at this time

## 2023-08-09 NOTE — Patient Instructions (Signed)
Will refill your venlafaxine No other changes  No blood work Follow up with your specialists Return in 2-3 months

## 2023-08-09 NOTE — Assessment & Plan Note (Signed)
Could be multifactorial. Could be from her recent hospitalization or from her multiple comorbidities including for heart failure with reduced ejection fraction.  Advised to continue to move around as tolerated, try to do few strength training exercises while sitting in chair etc. I do not feel the need to repeat her blood work today.  Patient and her husband agree with this. Report back if symptoms worsen

## 2023-08-09 NOTE — Assessment & Plan Note (Signed)
Congestive Heart Failure Recent exacerbation due to fluid accumulation, leading to hospitalization (Dec 13-16). Current ejection fraction 15-20%. Reports persistent fatigue and mild dyspnea. Blood pressure 80/56 mmHg, managed to prevent cardiac overwork. No current edema or pulmonary congestion. Weight increased by 1.2 kg since Dec 1, within acceptable limits. Discussed daily weight monitoring and risks of hypotension. - Monitor daily weight - Continue current medications including diuretics - Advise slow positional changes to avoid dizziness - Follow up with cardiologist Dr. Judithe Modest on Jan 10

## 2023-08-09 NOTE — Progress Notes (Signed)
Subjective:  Patient ID: Bridget [name], female    DOB: 30-Jun-1941  Age: 82 y.o. MRN: [id number]  Chief Complaint  Patient presents with   Hospitalization Follow-up    HPI  Discussed the use of AI scribe software for clinical note transcription with the patient, who gave verbal consent to proceed.  History of Present Illness          The patient, with a history of congestive heart failure and a pacemaker, was recently hospitalized due to fluid accumulation in the lungs, which led to breathing difficulties. The patient was admitted on the 13th of December and discharged on the 16th of December with diagnosis of acute respiratory failure and exacerbation of heart failure with reduced ejection fraction.. Since discharge, the patient reports feeling "tired" and "not too good," with some shortness of breath. The patient's activity level has been reduced, with limited mobility around the house.  The patient's heart function is reportedly struggling, with an ejection fraction of 15-20%. The patient's pacemaker cannot be changed, and the patient is on increased diuretic therapy. The patient also takes metformin for blood sugar control.  The patient has had recent blood work, which revealed a pericardial effusion and moderately severe mitral regurgitation. The patient's blood sugar was high at the time of discharge but has since improved. The patient checks blood sugar levels daily.  The patient's weight has been stable, with a slight increase of 1.2 kilos from December 1st to December 26th. The patient's blood pressure has been on the lower side, and the patient sometimes feels dizzy.  The patient also reports chronic, intermittent diarrhea, which has been ongoing for an unspecified duration. The patient denies any leg swelling. The patient is also on venlafaxine for depression, which she reports is helping. The patient has upcoming appointments with a cardiologist and a nephrologist.      04/23/2023    8:09 AM 03/09/2023    9:16 AM 01/18/2023    7:52 AM 11/01/2022    9:47 AM 10/04/2022    7:56 AM  Depression screen PHQ 2/9  Decreased Interest 1 0 0 0 0  Down, Depressed, Hopeless 1 0 1 0 1  PHQ - 2 Score 2 0 1 0 1  Altered sleeping 2  1  1   Tired, decreased energy 3  1  3   Change in appetite 1  1  1   Feeling bad or failure about yourself  1  0  1  Trouble concentrating 0  1  2  Moving slowly or fidgety/restless 1  0  0  Suicidal thoughts 0  0  0  PHQ-9 Score 10  5  9   Difficult doing work/chores Somewhat difficult  Not difficult at all  Not difficult at all        04/23/2023    8:09 AM  Fall Risk   Falls in the past year? 1  Number falls in past yr: 1  Injury with Fall? 0  Risk for fall due to : History of fall(s)  Follow up Falls evaluation completed    Patient Care Team: Blane Ohara, MD as PCP - General (Family Medicine) Reather Littler, MD (Inactive) as Consulting Physician (Endocrinology) Weston Settle, MD as Consulting Physician (Oncology) Diamond Nickel., MD as Referring Physician (Cardiology) Lynann Bologna, MD as Consulting Physician (Gastroenterology) [facility] (Ophthalmology) Zettie Pho, [facility] (Inactive) (Pharmacist) Diamond Nickel., MD as Referring Physician (Cardiology) Ninfa Meeker, FNP (Inactive) (Family Medicine) Ronda Fairly, MD (Infectious Diseases)  Review of Systems  Constitutional:  Positive for fatigue.  HENT: Negative.    Eyes: Negative.   Respiratory:  Positive for shortness of breath.   Cardiovascular: Negative.   Gastrointestinal:  Positive for diarrhea (chronic).  Endocrine: Negative.   Genitourinary: Negative.   Musculoskeletal: Negative.   Neurological: Negative.   Psychiatric/Behavioral: Negative.      Current Outpatient Medications on File Prior to Visit  Medication Sig Dispense Refill   aspirin 81 MG chewable tablet Chew 81 mg by mouth in the morning.      Blood Glucose Monitoring Suppl  (ONETOUCH VERIO FLEX SYSTEM) w/Device KIT Check sugar once daily 1 kit 0   carvedilol (COREG) 3.125 MG tablet Take 3.125 mg by mouth 2 (two) times daily with a meal.     cephALEXin (KEFLEX) 500 MG capsule Take 500 mg by mouth 2 (two) times daily.     Cholecalciferol (VITAMIN D-3) 1000 UNITS CAPS Take 1,000 Units by mouth daily.     digoxin (LANOXIN) 0.125 MG tablet Take 0.0625 mg by mouth every other day.     doxycycline (ADOXA) 100 MG tablet Take 100 mg by mouth 2 (two) times daily.     empagliflozin (JARDIANCE) 10 MG TABS tablet Take 1 tablet (10 mg total) by mouth daily with breakfast. 90 tablet 3   furosemide (LASIX) 40 MG tablet Take 40 mg by mouth daily.     glucose blood (ONETOUCH VERIO) test strip USE 1 STRIP TO CHECK GLUCOSE ONCE DAILY 100 strip 12   Lancets (ONETOUCH DELICA PLUS LANCET33G) MISC USE 1  TO CHECK GLUCOSE ONCE DAILY 100 each 12   lovastatin (MEVACOR) 40 MG tablet TAKE 1 TABLET BY MOUTH AT BEDTIME 90 tablet 0   metFORMIN (GLUCOPHAGE) 500 MG tablet TAKE 1 TABLET BY MOUTH WITH EVENING MEAL 90 tablet 3   Multiple Vitamins-Minerals (DAILY MULTI VITAMIN/MINERALS PO) Take 1 each by mouth daily.     nitroGLYCERIN (NITROSTAT) 0.4 MG SL tablet DISSOLVE ONE TABLET UNDER THE TONGUE EVERY 5 MINUTES AS NEEDED FOR CHEST PAIN.  DO NOT EXCEED A TOTAL OF 3 DOSES IN 15 MINUTES 25 tablet 0   sacubitril-valsartan (ENTRESTO) 49-51 MG Take 1 tablet by mouth 2 (two) times daily. 60 tablet 2   venlafaxine XR (EFFEXOR-XR) 75 MG 24 hr capsule TAKE 2 CAPSULES BY MOUTH ONCE DAILY WITH BREAKFAST 180 capsule 0   No current facility-administered medications on file prior to visit.   Past Medical History:  Diagnosis Date   Anemia    Anxiety and depression    Arthritis    HANDS,ARMS,FEET   Asthma    Atrioventricular block    CAD (coronary artery disease)    CHF (congestive heart failure) (HCC)    Chronic renal insufficiency    Clotting disorder (HCC)    Complication of anesthesia    Difficulty  waking up once.   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 Otherwise normal colonoscopy   ESOPHAGOGASTRODUODENOSCOPY  11/16/2005   [facility]. Patchy areas of mucosal erythema in the antrum compatible with gastritis. Polyps in the stomach. Nodule in the antrum. Medium hiatal hernia   ESOPHAGOGASTRODUODENOSCOPY (EGD) WITH PROPOFOL N/A 04/13/2022   Procedure: ESOPHAGOGASTRODUODENOSCOPY (EGD) WITH PROPOFOL;  Surgeon: Lynann Bologna, MD;  Location: [facility];  Service: Gastroenterology;  Laterality: N/A;   MALONEY DILATION  04/13/2022   Procedure: Elease Hashimoto DILATION;  Surgeon: Lynann Bologna, MD;  Location: [facility];  Service: Gastroenterology;;   pacemaker  09/27/2016   PACEMAKER REVISION  09/27/2020   PACEMAKER UPGRADE DUAL CHAMBER TO BIV    Family History  Problem Relation Age of Onset   High blood pressure Mother    Diabetes Mother         no medication needed   Kidney disease Mother    Pneumonia Mother    Colon polyps Father    High blood pressure Father    Colon cancer Father    Cancer Maternal Grandmother        Leukemia   Cancer Other        Ovarian   Rectal cancer Neg Hx    Stomach cancer Neg Hx    Esophageal cancer Neg Hx    Liver cancer Neg Hx    Pancreatic cancer Neg Hx    Crohn's disease Neg Hx    Social History   Socioeconomic History   Marital status: Married    Spouse name: Darryl   Number of children: 3   Years of education: Not on file   Highest education level: 9th grade  Occupational History   Occupation: Retired  Tobacco Use   Smoking status: Never    Passive exposure: Past   Smokeless tobacco: Never  Vaping Use   Vaping status: Never Used  Substance and Sexual Activity   Alcohol use: Never   Drug use: Never   Sexual activity: Not on file  Other Topics Concern   Not on file  Social History Narrative   Lives at home with her husband   Right handed   Caffeine: mostly tea, 0-2 cups a day   Social Drivers of Corporate investment banker Strain: Low Risk  (04/23/2023)   Overall Financial Resource Strain (CARDIA)    Difficulty of Paying Living Expenses: Not hard at all  Recent Concern: Financial Resource Strain - High Risk (01/23/2023)   Overall Financial Resource Strain (CARDIA)    Difficulty of Paying Living Expenses: Hard  Food Insecurity: Low Risk  (07/29/2023)   Received from [facility]   Hunger Vital Sign    Worried About Running Out of Food in the Last Year: Never true    Ran Out of Food in the Last Year: Never true  Transportation Needs: No Transportation Needs (07/29/2023)   Received from [employer]  In the past 12 months, has lack of reliable transportation kept you from medical appointments, meetings, work or from getting things needed for daily living? : No  Physical Activity: Insufficiently Active (04/23/2023)   Exercise Vital Sign    Days of  Exercise per Week: 7 days    Minutes of Exercise per Session: 10 min  Stress: No Stress Concern Present (04/23/2023)   [employer] of Occupational Health - Occupational Stress Questionnaire    Feeling of Stress : Only a little  Social Connections: Socially Integrated (04/23/2023)   Social Connection and Isolation Panel [NHANES]    Frequency of Communication with Friends and Family: More than three times a week    Frequency of Social Gatherings with Friends and Family: Once a week    Attends Religious Services: More than 4 times per year    Active Member of [employer] or Organizations: Yes    Attends Engineer, structural: More than 4 times per year    Marital Status: Married    Objective:  BP (!) 80/56 (BP Location: Right Arm, Patient Position: Sitting, Cuff Size: Small)   Pulse 80   Temp 97.6 F (36.4 C) (Temporal)   Resp 16   Ht 5\' 1"  (1.549 m)   Wt 84 lb 12.8 oz (38.5 kg)   BMI 16.02 kg/m      08/09/2023    9:47 AM 07/10/2023    8:12 AM 06/27/2023    9:45 AM  BP/Weight  Systolic BP 80 110 139  Diastolic BP 56 70 105  Wt. (Lbs) 84.8 89.4 88.2  BMI 16.02 kg/m2 16.89 kg/m2 16.67 kg/m2    Physical Exam Vitals and nursing note reviewed.  Constitutional:      Appearance: Normal appearance.  HENT:     Head: Normocephalic and atraumatic.     Mouth/Throat:     Mouth: Mucous membranes are moist.  Cardiovascular:     Rate and Rhythm: Normal rate.     Heart sounds: Murmur (pan systolic murmur) heard.  Pulmonary:     Effort: Pulmonary effort is normal.     Breath sounds: Normal breath sounds.  Abdominal:     General: There is no distension.     Tenderness: There is no abdominal tenderness.  Musculoskeletal:        General: No swelling.     Cervical back: Normal range of motion.  Skin:    Comments: Venous stasis changes noted in the lower legs bilaterally   Neurological:     General: No focal deficit present.     Mental Status: She is alert.  Psychiatric:         Mood and Affect: Mood normal.     Diabetic Foot Exam - Simple   No data filed      Lab Results  Component Value Date   WBC 7.7 06/27/2023   HGB 12.9 06/27/2023   HCT 39.5 06/27/2023   PLT 173 06/27/2023   GLUCOSE 169 (H) 06/27/2023   CHOL 167 01/18/2023   TRIG 110 01/18/2023   HDL 80 01/18/2023   LDLDIRECT 94.2 12/05/2013   LDLCALC 68 01/18/2023   ALT 19 06/27/2023   AST 27 06/27/2023   NA 137 06/27/2023   K 4.3 06/27/2023   CL 98 06/27/2023   CREATININE 1.60 (H) 06/27/2023   BUN 52 (H) 06/27/2023   CO2 25 06/27/2023   TSH 1.220 04/20/2022   INR 1.3 (H) 11/12/2020   HGBA1C 6.4 (A) 03/13/2023   MICROALBUR 0.8 03/13/2023  Assessment & Plan:    Hospital discharge follow-up Assessment & Plan: Admitted to [facility] from December 13 till December 16 for exacerbation of heart failure. Her Lasix dose was increased She has been feeling reasonably well except for fatigue She has an appointment with her cardiologist in January. No change in medications at this time    Heart failure with reduced ejection fraction, NYHA class III (HCC) Assessment & Plan: Congestive Heart Failure Recent exacerbation due to fluid accumulation, leading to hospitalization (Dec 13-16). Current ejection fraction 15-20%. Reports persistent fatigue and mild dyspnea. Blood pressure 80/56 mmHg, managed to prevent cardiac overwork. No current edema or pulmonary congestion. Weight increased by 1.2 kg since Dec 1, within acceptable limits. Discussed daily weight monitoring and risks of hypotension. - Monitor daily weight - Continue current medications including diuretics - Advise slow positional changes to avoid dizziness - Follow up with cardiologist Dr. Judithe Modest on Jan 10   Other fatigue Assessment & Plan: Could be multifactorial. Could be from her recent hospitalization or from her multiple comorbidities including for heart failure with reduced ejection fraction.  Advised to  continue to move around as tolerated, try to do few strength training exercises while sitting in chair etc. I do not feel the need to repeat her blood work today.  Patient and her husband agree with this. Report back if symptoms worsen      No orders of the defined types were placed in this encounter.   No orders of the defined types were placed in this encounter.    Follow-up: Return in about 3 months (around 11/07/2023).   I,Angela Taylor,acting as a Neurosurgeon for [employer], MD.,have documented all relevant documentation on the behalf of Windell Moment, MD,as directed by  Windell Moment, MD while in the presence of Windell Moment, MD.   An After Visit Summary was printed and given to the patient.  Windell Moment, MD [facility] [phone]

## 2023-08-10 ENCOUNTER — Other Ambulatory Visit: Payer: Self-pay | Admitting: Family Medicine

## 2023-08-10 DIAGNOSIS — F330 Major depressive disorder, recurrent, mild: Secondary | ICD-10-CM

## 2023-08-23 DIAGNOSIS — I428 Other cardiomyopathies: Secondary | ICD-10-CM | POA: Diagnosis not present

## 2023-08-23 DIAGNOSIS — Z45018 Encounter for adjustment and management of other part of cardiac pacemaker: Secondary | ICD-10-CM | POA: Diagnosis not present

## 2023-08-23 DIAGNOSIS — Z9889 Other specified postprocedural states: Secondary | ICD-10-CM | POA: Diagnosis not present

## 2023-08-23 DIAGNOSIS — I5020 Unspecified systolic (congestive) heart failure: Secondary | ICD-10-CM | POA: Diagnosis not present

## 2023-08-23 DIAGNOSIS — I340 Nonrheumatic mitral (valve) insufficiency: Secondary | ICD-10-CM | POA: Diagnosis not present

## 2023-08-23 DIAGNOSIS — I351 Nonrheumatic aortic (valve) insufficiency: Secondary | ICD-10-CM | POA: Diagnosis not present

## 2023-08-23 DIAGNOSIS — N1832 Chronic kidney disease, stage 3b: Secondary | ICD-10-CM | POA: Diagnosis not present

## 2023-08-23 DIAGNOSIS — I442 Atrioventricular block, complete: Secondary | ICD-10-CM | POA: Diagnosis not present

## 2023-08-26 DIAGNOSIS — J441 Chronic obstructive pulmonary disease with (acute) exacerbation: Secondary | ICD-10-CM | POA: Diagnosis not present

## 2023-08-26 DIAGNOSIS — J90 Pleural effusion, not elsewhere classified: Secondary | ICD-10-CM | POA: Diagnosis not present

## 2023-08-26 DIAGNOSIS — R0603 Acute respiratory distress: Secondary | ICD-10-CM | POA: Diagnosis not present

## 2023-08-26 DIAGNOSIS — Z1152 Encounter for screening for COVID-19: Secondary | ICD-10-CM | POA: Diagnosis not present

## 2023-08-26 DIAGNOSIS — I4891 Unspecified atrial fibrillation: Secondary | ICD-10-CM | POA: Diagnosis not present

## 2023-08-26 DIAGNOSIS — R0602 Shortness of breath: Secondary | ICD-10-CM | POA: Diagnosis not present

## 2023-08-26 DIAGNOSIS — F32A Depression, unspecified: Secondary | ICD-10-CM | POA: Diagnosis not present

## 2023-08-26 DIAGNOSIS — Z7982 Long term (current) use of aspirin: Secondary | ICD-10-CM | POA: Diagnosis not present

## 2023-08-26 DIAGNOSIS — J9601 Acute respiratory failure with hypoxia: Secondary | ICD-10-CM | POA: Diagnosis not present

## 2023-08-26 DIAGNOSIS — F419 Anxiety disorder, unspecified: Secondary | ICD-10-CM | POA: Diagnosis not present

## 2023-08-26 DIAGNOSIS — I130 Hypertensive heart and chronic kidney disease with heart failure and stage 1 through stage 4 chronic kidney disease, or unspecified chronic kidney disease: Secondary | ICD-10-CM | POA: Diagnosis not present

## 2023-08-26 DIAGNOSIS — N183 Chronic kidney disease, stage 3 unspecified: Secondary | ICD-10-CM | POA: Diagnosis not present

## 2023-08-26 DIAGNOSIS — Z882 Allergy status to sulfonamides status: Secondary | ICD-10-CM | POA: Diagnosis not present

## 2023-08-26 DIAGNOSIS — Z7984 Long term (current) use of oral hypoglycemic drugs: Secondary | ICD-10-CM | POA: Diagnosis not present

## 2023-08-26 DIAGNOSIS — N179 Acute kidney failure, unspecified: Secondary | ICD-10-CM | POA: Diagnosis not present

## 2023-08-26 DIAGNOSIS — Z86718 Personal history of other venous thrombosis and embolism: Secondary | ICD-10-CM | POA: Diagnosis not present

## 2023-08-26 DIAGNOSIS — Z888 Allergy status to other drugs, medicaments and biological substances status: Secondary | ICD-10-CM | POA: Diagnosis not present

## 2023-08-26 DIAGNOSIS — Z950 Presence of cardiac pacemaker: Secondary | ICD-10-CM | POA: Diagnosis not present

## 2023-08-26 DIAGNOSIS — M1990 Unspecified osteoarthritis, unspecified site: Secondary | ICD-10-CM | POA: Diagnosis not present

## 2023-08-26 DIAGNOSIS — E1122 Type 2 diabetes mellitus with diabetic chronic kidney disease: Secondary | ICD-10-CM | POA: Diagnosis not present

## 2023-08-26 DIAGNOSIS — M109 Gout, unspecified: Secondary | ICD-10-CM | POA: Diagnosis not present

## 2023-08-26 DIAGNOSIS — R069 Unspecified abnormalities of breathing: Secondary | ICD-10-CM | POA: Diagnosis not present

## 2023-08-26 DIAGNOSIS — E872 Acidosis, unspecified: Secondary | ICD-10-CM | POA: Diagnosis not present

## 2023-08-26 DIAGNOSIS — Z79899 Other long term (current) drug therapy: Secondary | ICD-10-CM | POA: Diagnosis not present

## 2023-08-26 DIAGNOSIS — R0902 Hypoxemia: Secondary | ICD-10-CM | POA: Diagnosis not present

## 2023-08-26 DIAGNOSIS — I5023 Acute on chronic systolic (congestive) heart failure: Secondary | ICD-10-CM | POA: Diagnosis present

## 2023-08-26 DIAGNOSIS — E039 Hypothyroidism, unspecified: Secondary | ICD-10-CM | POA: Diagnosis not present

## 2023-08-26 DIAGNOSIS — I509 Heart failure, unspecified: Secondary | ICD-10-CM | POA: Diagnosis not present

## 2023-08-26 DIAGNOSIS — I428 Other cardiomyopathies: Secondary | ICD-10-CM | POA: Diagnosis not present

## 2023-08-28 DIAGNOSIS — Z950 Presence of cardiac pacemaker: Secondary | ICD-10-CM

## 2023-08-30 ENCOUNTER — Telehealth: Payer: Self-pay

## 2023-08-30 DIAGNOSIS — J4530 Mild persistent asthma, uncomplicated: Secondary | ICD-10-CM | POA: Diagnosis not present

## 2023-08-30 DIAGNOSIS — K5790 Diverticulosis of intestine, part unspecified, without perforation or abscess without bleeding: Secondary | ICD-10-CM | POA: Diagnosis not present

## 2023-08-30 DIAGNOSIS — I130 Hypertensive heart and chronic kidney disease with heart failure and stage 1 through stage 4 chronic kidney disease, or unspecified chronic kidney disease: Secondary | ICD-10-CM | POA: Diagnosis not present

## 2023-08-30 DIAGNOSIS — J439 Emphysema, unspecified: Secondary | ICD-10-CM | POA: Diagnosis not present

## 2023-08-30 DIAGNOSIS — N1832 Chronic kidney disease, stage 3b: Secondary | ICD-10-CM | POA: Diagnosis not present

## 2023-08-30 DIAGNOSIS — E041 Nontoxic single thyroid nodule: Secondary | ICD-10-CM | POA: Diagnosis not present

## 2023-08-30 DIAGNOSIS — K5909 Other constipation: Secondary | ICD-10-CM | POA: Diagnosis not present

## 2023-08-30 DIAGNOSIS — J9601 Acute respiratory failure with hypoxia: Secondary | ICD-10-CM | POA: Diagnosis not present

## 2023-08-30 DIAGNOSIS — E782 Mixed hyperlipidemia: Secondary | ICD-10-CM | POA: Diagnosis not present

## 2023-08-30 DIAGNOSIS — I051 Rheumatic mitral insufficiency: Secondary | ICD-10-CM | POA: Diagnosis not present

## 2023-08-30 DIAGNOSIS — D631 Anemia in chronic kidney disease: Secondary | ICD-10-CM | POA: Diagnosis not present

## 2023-08-30 DIAGNOSIS — M109 Gout, unspecified: Secondary | ICD-10-CM | POA: Diagnosis not present

## 2023-08-30 DIAGNOSIS — I2510 Atherosclerotic heart disease of native coronary artery without angina pectoris: Secondary | ICD-10-CM | POA: Diagnosis not present

## 2023-08-30 DIAGNOSIS — I5023 Acute on chronic systolic (congestive) heart failure: Secondary | ICD-10-CM | POA: Diagnosis not present

## 2023-08-30 DIAGNOSIS — M810 Age-related osteoporosis without current pathological fracture: Secondary | ICD-10-CM | POA: Diagnosis not present

## 2023-08-30 DIAGNOSIS — E039 Hypothyroidism, unspecified: Secondary | ICD-10-CM | POA: Diagnosis not present

## 2023-08-30 DIAGNOSIS — M150 Primary generalized (osteo)arthritis: Secondary | ICD-10-CM | POA: Diagnosis not present

## 2023-08-30 DIAGNOSIS — J441 Chronic obstructive pulmonary disease with (acute) exacerbation: Secondary | ICD-10-CM | POA: Diagnosis not present

## 2023-08-30 DIAGNOSIS — E1122 Type 2 diabetes mellitus with diabetic chronic kidney disease: Secondary | ICD-10-CM | POA: Diagnosis not present

## 2023-08-30 DIAGNOSIS — Q2572 Congenital pulmonary arteriovenous malformation: Secondary | ICD-10-CM | POA: Diagnosis not present

## 2023-08-30 DIAGNOSIS — N179 Acute kidney failure, unspecified: Secondary | ICD-10-CM | POA: Diagnosis not present

## 2023-08-30 DIAGNOSIS — I428 Other cardiomyopathies: Secondary | ICD-10-CM | POA: Diagnosis not present

## 2023-08-30 DIAGNOSIS — I4891 Unspecified atrial fibrillation: Secondary | ICD-10-CM | POA: Diagnosis not present

## 2023-08-30 DIAGNOSIS — F419 Anxiety disorder, unspecified: Secondary | ICD-10-CM | POA: Diagnosis not present

## 2023-08-30 DIAGNOSIS — F330 Major depressive disorder, recurrent, mild: Secondary | ICD-10-CM | POA: Diagnosis not present

## 2023-08-30 NOTE — Telephone Encounter (Signed)
Patient should restart magnesium. What ever she already has at home is fine. Either magnesium oxide or magnesium sulfate.  Not sure what concern is about the digoxin is. If Dr. Judithe Modest wants her on it, she should take it. If she has concerns about side effects, I would recommend call him.  Dr. Sedalia Muta

## 2023-08-30 NOTE — Telephone Encounter (Signed)
Copied from CRM [id number]. Topic: Clinical - Home Health Verbal Orders >> Aug 30, 2023  3:52 PM Donita Brooks wrote: Caller/Agency: Revonda Standard from [facility]  Callback Number: [phone] Service Requested: Skilled Nursing Frequency: 2 weeks 2  1 week 6 2 prn Any new concerns about the patient? Yes / husband is stating that pt is taking medication  digoxin (LANOXIN) 0.125 MG tablet every other day. husband is also stating if he should get the magnesium or not for pt

## 2023-08-31 DIAGNOSIS — J441 Chronic obstructive pulmonary disease with (acute) exacerbation: Secondary | ICD-10-CM | POA: Diagnosis not present

## 2023-08-31 DIAGNOSIS — N1832 Chronic kidney disease, stage 3b: Secondary | ICD-10-CM | POA: Diagnosis not present

## 2023-08-31 DIAGNOSIS — E1122 Type 2 diabetes mellitus with diabetic chronic kidney disease: Secondary | ICD-10-CM | POA: Diagnosis not present

## 2023-08-31 DIAGNOSIS — D631 Anemia in chronic kidney disease: Secondary | ICD-10-CM | POA: Diagnosis not present

## 2023-08-31 DIAGNOSIS — I130 Hypertensive heart and chronic kidney disease with heart failure and stage 1 through stage 4 chronic kidney disease, or unspecified chronic kidney disease: Secondary | ICD-10-CM | POA: Diagnosis not present

## 2023-08-31 DIAGNOSIS — I5023 Acute on chronic systolic (congestive) heart failure: Secondary | ICD-10-CM | POA: Diagnosis not present

## 2023-09-02 DIAGNOSIS — I3139 Other pericardial effusion (noninflammatory): Secondary | ICD-10-CM | POA: Diagnosis not present

## 2023-09-02 DIAGNOSIS — F330 Major depressive disorder, recurrent, mild: Secondary | ICD-10-CM | POA: Diagnosis not present

## 2023-09-02 DIAGNOSIS — E875 Hyperkalemia: Secondary | ICD-10-CM | POA: Diagnosis not present

## 2023-09-02 DIAGNOSIS — R0600 Dyspnea, unspecified: Secondary | ICD-10-CM | POA: Diagnosis not present

## 2023-09-02 DIAGNOSIS — I517 Cardiomegaly: Secondary | ICD-10-CM | POA: Diagnosis not present

## 2023-09-02 DIAGNOSIS — E1165 Type 2 diabetes mellitus with hyperglycemia: Secondary | ICD-10-CM | POA: Diagnosis not present

## 2023-09-02 DIAGNOSIS — I700 Atherosclerosis of aorta: Secondary | ICD-10-CM | POA: Diagnosis not present

## 2023-09-02 DIAGNOSIS — Z8673 Personal history of transient ischemic attack (TIA), and cerebral infarction without residual deficits: Secondary | ICD-10-CM | POA: Diagnosis not present

## 2023-09-02 DIAGNOSIS — R739 Hyperglycemia, unspecified: Secondary | ICD-10-CM | POA: Diagnosis not present

## 2023-09-02 DIAGNOSIS — J9611 Chronic respiratory failure with hypoxia: Secondary | ICD-10-CM | POA: Diagnosis not present

## 2023-09-02 DIAGNOSIS — I959 Hypotension, unspecified: Secondary | ICD-10-CM | POA: Diagnosis not present

## 2023-09-02 DIAGNOSIS — J449 Chronic obstructive pulmonary disease, unspecified: Secondary | ICD-10-CM | POA: Diagnosis not present

## 2023-09-02 DIAGNOSIS — E785 Hyperlipidemia, unspecified: Secondary | ICD-10-CM | POA: Diagnosis not present

## 2023-09-02 DIAGNOSIS — Z950 Presence of cardiac pacemaker: Secondary | ICD-10-CM | POA: Diagnosis not present

## 2023-09-02 DIAGNOSIS — I340 Nonrheumatic mitral (valve) insufficiency: Secondary | ICD-10-CM | POA: Diagnosis not present

## 2023-09-02 DIAGNOSIS — R0602 Shortness of breath: Secondary | ICD-10-CM | POA: Diagnosis not present

## 2023-09-02 DIAGNOSIS — Z86718 Personal history of other venous thrombosis and embolism: Secondary | ICD-10-CM | POA: Diagnosis not present

## 2023-09-02 DIAGNOSIS — R918 Other nonspecific abnormal finding of lung field: Secondary | ICD-10-CM | POA: Diagnosis not present

## 2023-09-02 DIAGNOSIS — J4489 Other specified chronic obstructive pulmonary disease: Secondary | ICD-10-CM | POA: Diagnosis not present

## 2023-09-02 DIAGNOSIS — J9811 Atelectasis: Secondary | ICD-10-CM | POA: Diagnosis not present

## 2023-09-02 DIAGNOSIS — E440 Moderate protein-calorie malnutrition: Secondary | ICD-10-CM | POA: Diagnosis not present

## 2023-09-02 DIAGNOSIS — F32A Depression, unspecified: Secondary | ICD-10-CM | POA: Diagnosis not present

## 2023-09-02 DIAGNOSIS — J9601 Acute respiratory failure with hypoxia: Secondary | ICD-10-CM | POA: Diagnosis not present

## 2023-09-02 DIAGNOSIS — Z743 Need for continuous supervision: Secondary | ICD-10-CM | POA: Diagnosis not present

## 2023-09-02 DIAGNOSIS — R0902 Hypoxemia: Secondary | ICD-10-CM | POA: Diagnosis not present

## 2023-09-02 DIAGNOSIS — E1122 Type 2 diabetes mellitus with diabetic chronic kidney disease: Secondary | ICD-10-CM | POA: Diagnosis not present

## 2023-09-02 DIAGNOSIS — R578 Other shock: Secondary | ICD-10-CM | POA: Diagnosis not present

## 2023-09-02 DIAGNOSIS — R410 Disorientation, unspecified: Secondary | ICD-10-CM | POA: Diagnosis not present

## 2023-09-02 DIAGNOSIS — J918 Pleural effusion in other conditions classified elsewhere: Secondary | ICD-10-CM | POA: Diagnosis not present

## 2023-09-02 DIAGNOSIS — Z66 Do not resuscitate: Secondary | ICD-10-CM | POA: Diagnosis not present

## 2023-09-02 DIAGNOSIS — I351 Nonrheumatic aortic (valve) insufficiency: Secondary | ICD-10-CM | POA: Diagnosis not present

## 2023-09-02 DIAGNOSIS — I130 Hypertensive heart and chronic kidney disease with heart failure and stage 1 through stage 4 chronic kidney disease, or unspecified chronic kidney disease: Secondary | ICD-10-CM | POA: Diagnosis not present

## 2023-09-02 DIAGNOSIS — Z8709 Personal history of other diseases of the respiratory system: Secondary | ICD-10-CM | POA: Diagnosis not present

## 2023-09-02 DIAGNOSIS — I5021 Acute systolic (congestive) heart failure: Secondary | ICD-10-CM | POA: Diagnosis not present

## 2023-09-02 DIAGNOSIS — R570 Cardiogenic shock: Secondary | ICD-10-CM | POA: Diagnosis not present

## 2023-09-02 DIAGNOSIS — J9621 Acute and chronic respiratory failure with hypoxia: Secondary | ICD-10-CM | POA: Diagnosis not present

## 2023-09-02 DIAGNOSIS — I4430 Unspecified atrioventricular block: Secondary | ICD-10-CM | POA: Diagnosis not present

## 2023-09-02 DIAGNOSIS — J90 Pleural effusion, not elsewhere classified: Secondary | ICD-10-CM | POA: Diagnosis not present

## 2023-09-02 DIAGNOSIS — E872 Acidosis, unspecified: Secondary | ICD-10-CM | POA: Diagnosis not present

## 2023-09-02 DIAGNOSIS — Z515 Encounter for palliative care: Secondary | ICD-10-CM | POA: Diagnosis not present

## 2023-09-02 DIAGNOSIS — I5023 Acute on chronic systolic (congestive) heart failure: Secondary | ICD-10-CM | POA: Diagnosis not present

## 2023-09-02 DIAGNOSIS — R0989 Other specified symptoms and signs involving the circulatory and respiratory systems: Secondary | ICD-10-CM | POA: Diagnosis not present

## 2023-09-02 DIAGNOSIS — R531 Weakness: Secondary | ICD-10-CM | POA: Diagnosis not present

## 2023-09-02 DIAGNOSIS — R0689 Other abnormalities of breathing: Secondary | ICD-10-CM | POA: Diagnosis not present

## 2023-09-02 DIAGNOSIS — Z90710 Acquired absence of both cervix and uterus: Secondary | ICD-10-CM | POA: Diagnosis not present

## 2023-09-02 DIAGNOSIS — N1831 Chronic kidney disease, stage 3a: Secondary | ICD-10-CM | POA: Diagnosis not present

## 2023-09-02 DIAGNOSIS — J80 Acute respiratory distress syndrome: Secondary | ICD-10-CM | POA: Diagnosis not present

## 2023-09-02 DIAGNOSIS — J984 Other disorders of lung: Secondary | ICD-10-CM | POA: Diagnosis not present

## 2023-09-02 DIAGNOSIS — I110 Hypertensive heart disease with heart failure: Secondary | ICD-10-CM | POA: Diagnosis not present

## 2023-09-02 DIAGNOSIS — I420 Dilated cardiomyopathy: Secondary | ICD-10-CM | POA: Diagnosis not present

## 2023-09-02 DIAGNOSIS — L89151 Pressure ulcer of sacral region, stage 1: Secondary | ICD-10-CM | POA: Diagnosis not present

## 2023-09-02 DIAGNOSIS — J811 Chronic pulmonary edema: Secondary | ICD-10-CM | POA: Diagnosis not present

## 2023-09-02 DIAGNOSIS — Z48813 Encounter for surgical aftercare following surgery on the respiratory system: Secondary | ICD-10-CM | POA: Diagnosis not present

## 2023-09-02 DIAGNOSIS — N183 Chronic kidney disease, stage 3 unspecified: Secondary | ICD-10-CM | POA: Diagnosis not present

## 2023-09-02 DIAGNOSIS — Z9981 Dependence on supplemental oxygen: Secondary | ICD-10-CM | POA: Diagnosis not present

## 2023-09-02 DIAGNOSIS — I2489 Other forms of acute ischemic heart disease: Secondary | ICD-10-CM | POA: Diagnosis not present

## 2023-09-02 DIAGNOSIS — I083 Combined rheumatic disorders of mitral, aortic and tricuspid valves: Secondary | ICD-10-CM | POA: Diagnosis not present

## 2023-09-02 DIAGNOSIS — I5022 Chronic systolic (congestive) heart failure: Secondary | ICD-10-CM | POA: Diagnosis not present

## 2023-09-03 ENCOUNTER — Telehealth: Payer: Self-pay

## 2023-09-03 NOTE — Telephone Encounter (Signed)
Please advice  Copied from CRM [id number]. Topic: Clinical - Home Health Verbal Orders >> Aug 31, 2023  1:29 PM Desma Mcgregor wrote: Caller/Agency: [facility] Callback Number: [phone] Service Requested: Physical Therapy Frequency: 1W1 2W4 1W4 Strengthening and exercise, gail and transfer, balance exercise. Home exercise safety, education Any new concerns about the patient? Yes BP low 90/50. Please contact back about this.

## 2023-09-03 NOTE — Telephone Encounter (Signed)
Spoke with Meryl Dare (home healt nurse) and approved the orders.

## 2023-09-03 NOTE — Telephone Encounter (Signed)
Also, physical therapy will call cardiology. Dr. Sedalia Muta

## 2023-09-04 ENCOUNTER — Telehealth: Payer: Self-pay

## 2023-09-04 NOTE — Telephone Encounter (Signed)
Called patient to reschedule appointment due to provider being out the week of 11/12/23, unable to leave message for patient.

## 2023-09-15 DIAGNOSIS — I130 Hypertensive heart and chronic kidney disease with heart failure and stage 1 through stage 4 chronic kidney disease, or unspecified chronic kidney disease: Secondary | ICD-10-CM | POA: Diagnosis not present

## 2023-09-15 DIAGNOSIS — F32A Depression, unspecified: Secondary | ICD-10-CM | POA: Diagnosis not present

## 2023-09-15 DIAGNOSIS — J9611 Chronic respiratory failure with hypoxia: Secondary | ICD-10-CM | POA: Diagnosis not present

## 2023-09-15 DIAGNOSIS — Z950 Presence of cardiac pacemaker: Secondary | ICD-10-CM | POA: Diagnosis not present

## 2023-09-15 DIAGNOSIS — I4430 Unspecified atrioventricular block: Secondary | ICD-10-CM | POA: Diagnosis not present

## 2023-09-15 DIAGNOSIS — E1122 Type 2 diabetes mellitus with diabetic chronic kidney disease: Secondary | ICD-10-CM | POA: Diagnosis not present

## 2023-09-15 DIAGNOSIS — E440 Moderate protein-calorie malnutrition: Secondary | ICD-10-CM | POA: Diagnosis not present

## 2023-09-15 DIAGNOSIS — Z8709 Personal history of other diseases of the respiratory system: Secondary | ICD-10-CM | POA: Diagnosis not present

## 2023-09-15 DIAGNOSIS — I340 Nonrheumatic mitral (valve) insufficiency: Secondary | ICD-10-CM | POA: Diagnosis not present

## 2023-09-15 DIAGNOSIS — I351 Nonrheumatic aortic (valve) insufficiency: Secondary | ICD-10-CM | POA: Diagnosis not present

## 2023-09-15 DIAGNOSIS — J449 Chronic obstructive pulmonary disease, unspecified: Secondary | ICD-10-CM | POA: Diagnosis not present

## 2023-09-15 DIAGNOSIS — I5022 Chronic systolic (congestive) heart failure: Secondary | ICD-10-CM | POA: Diagnosis not present

## 2023-09-15 DIAGNOSIS — Z8673 Personal history of transient ischemic attack (TIA), and cerebral infarction without residual deficits: Secondary | ICD-10-CM | POA: Diagnosis not present

## 2023-09-15 DIAGNOSIS — N1831 Chronic kidney disease, stage 3a: Secondary | ICD-10-CM | POA: Diagnosis not present

## 2023-09-18 ENCOUNTER — Encounter: Payer: Self-pay | Admitting: Internal Medicine

## 2023-09-18 DIAGNOSIS — R404 Transient alteration of awareness: Secondary | ICD-10-CM | POA: Diagnosis not present

## 2023-09-18 DIAGNOSIS — R0902 Hypoxemia: Secondary | ICD-10-CM | POA: Diagnosis not present

## 2023-09-18 DIAGNOSIS — R000 Tachycardia, unspecified: Secondary | ICD-10-CM | POA: Diagnosis not present

## 2023-09-18 DIAGNOSIS — R0689 Other abnormalities of breathing: Secondary | ICD-10-CM | POA: Diagnosis not present

## 2023-09-24 ENCOUNTER — Telehealth: Payer: Self-pay

## 2023-09-24 NOTE — Telephone Encounter (Signed)
 FORMS PLACED IN DR. COXS BOX

## 2023-10-13 DEATH — deceased

## 2023-11-07 ENCOUNTER — Ambulatory Visit: Payer: Medicare Other | Admitting: Endocrinology

## 2023-11-14 ENCOUNTER — Ambulatory Visit: Payer: Medicare Other | Admitting: Family Medicine

## 2023-12-25 ENCOUNTER — Ambulatory Visit: Payer: Medicare Other | Admitting: Oncology

## 2023-12-25 ENCOUNTER — Other Ambulatory Visit: Payer: Medicare Other

## 2024-01-29 IMAGING — RF DG ESOPHAGUS
13 of 17 series · 13 of 24 positions shown · non-contrast
Comparison: NONE.

CLINICAL DATA: Dysphagia with solids and liquids, intermittent
cough, history of reflux

EXAM:
ESOPHAGUS/BARIUM SWALLOW/TABLET STUDY
TECHNIQUE: Combined double and single contrast examination was performed using
effervescent crystals, high-density barium, and thin liquid barium.
Gastric and proximal bowel imaging were also included in this exam.
this exam was performed by Matsuo, Nokman, and was supervised
and interpreted by Michika, Laniyan.
FLUOROSCOPY:
Radiation Exposure Index (as provided by the fluoroscopic device):
30.80 mGy

[Series 1: cp_standard · 0.34mm/px · 1 of 33 frames shown (1 of 7)]
[frame 5/33]
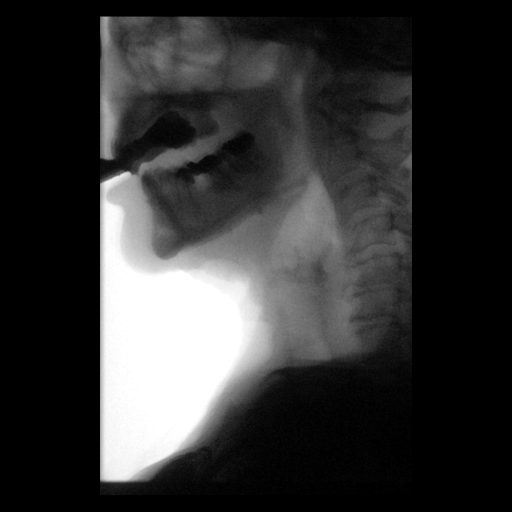

[Series 2: cp_standard · 0.34mm/px · 1 of 68 frames shown (2 of 7)]
[frame 11/68]
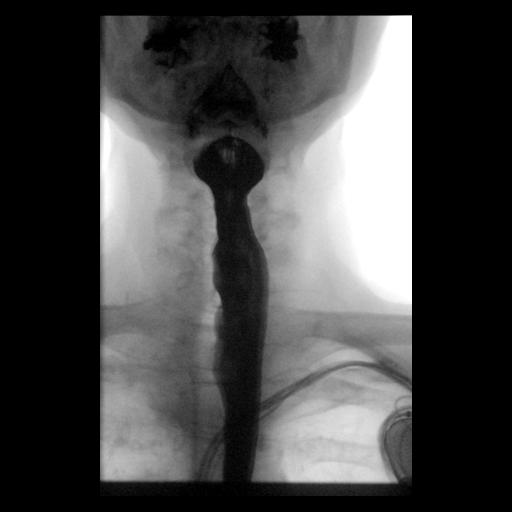

[Series 3: fluoro_barium 2fps_bw · 0.17mm/px · 1 of 4 frames shown (1 of 6)]
[frame 3/4]
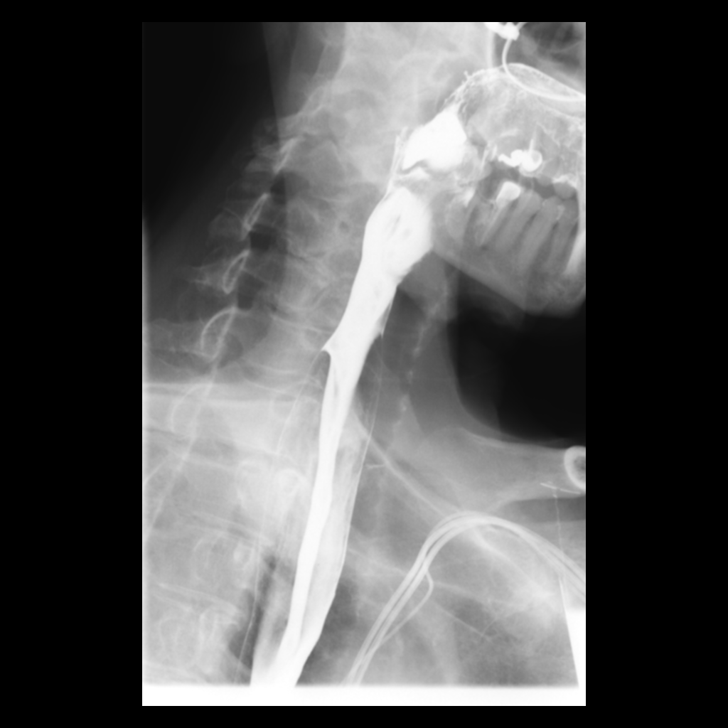

[Series 4: fluoro_barium 2fps_bw · 0.17mm/px · 1 of 2 frames shown (2 of 6)]
[frame 2/2]
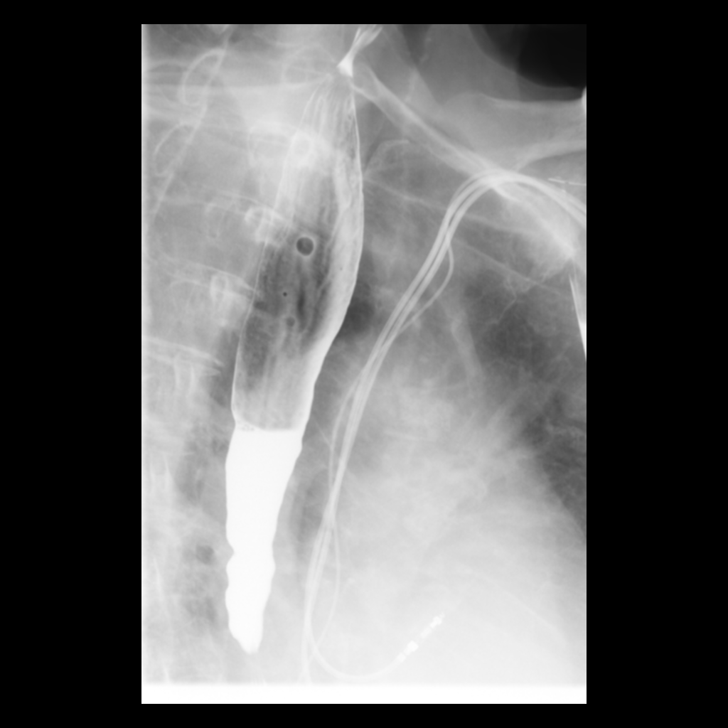

[Series 6: cp_standard · 0.34mm/px · 1 of 25 frames shown (3 of 7)]
[frame 4/25]
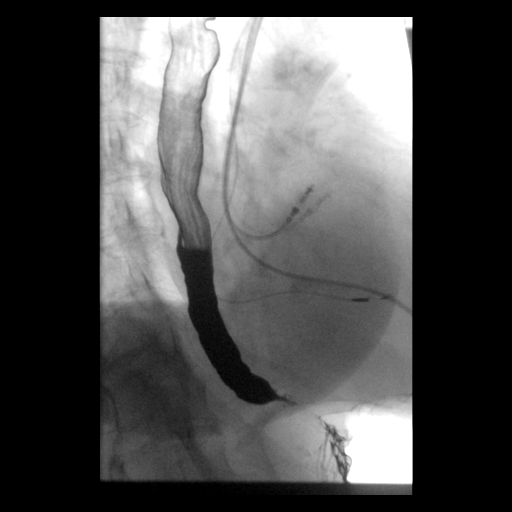

[Series 7: fluoro_barium 2fps_bw · 0.18mm/px · 1 of 1 slices shown (3 of 6)]
[im 1/1]
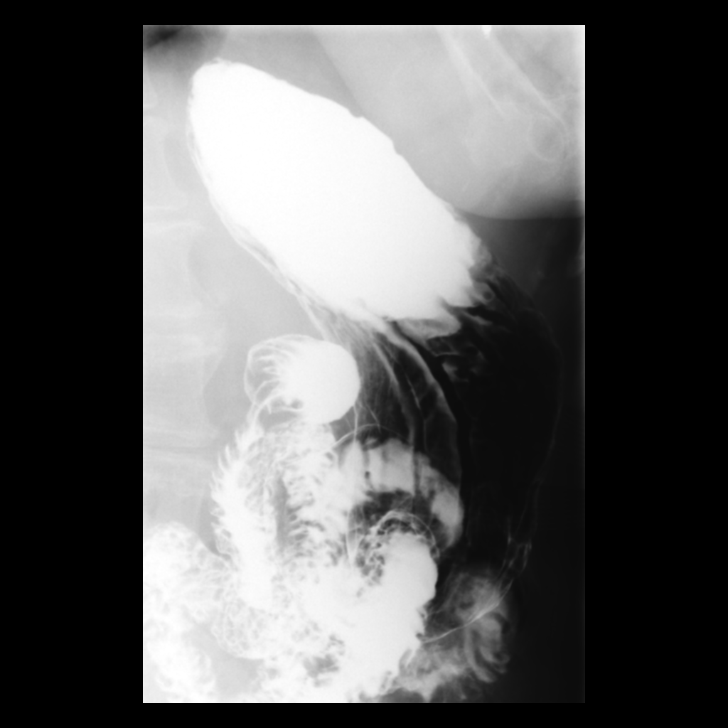

[Series 11: fluoro_barium 2fps_bw · 0.20mm/px · 1 of 1 slices shown (4 of 6)]
[im 1/1]
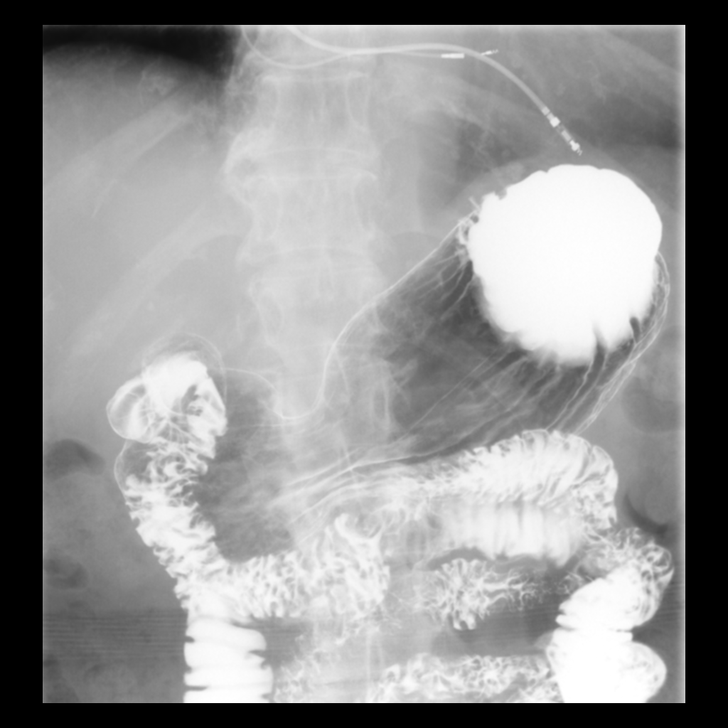

[Series 13: fluoro_barium 2fps_bw · 0.20mm/px · 1 of 1 slices shown (5 of 6)]
[im 1/1]
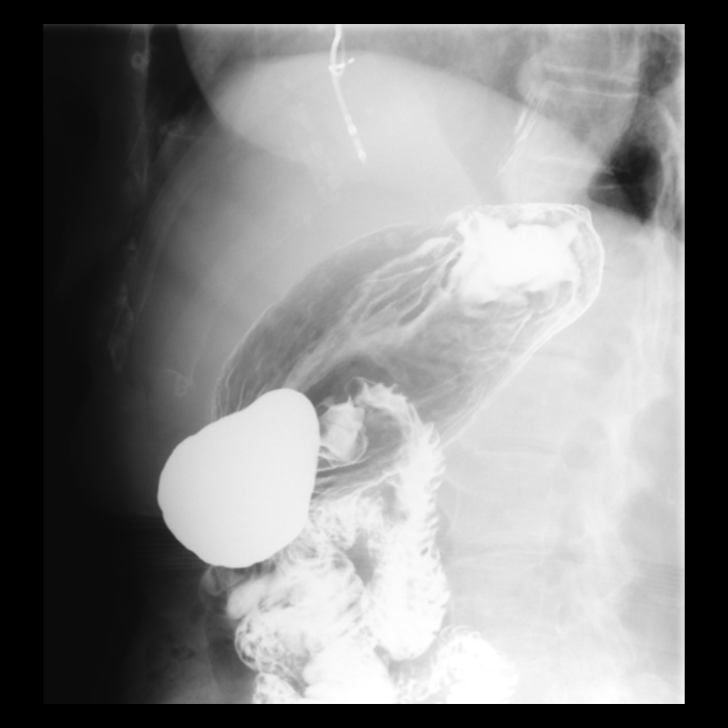

[Series 16: fluoro_barium 2fps_bw · 0.18mm/px · 1 of 2 frames shown (6 of 6)]
[frame 2/2]
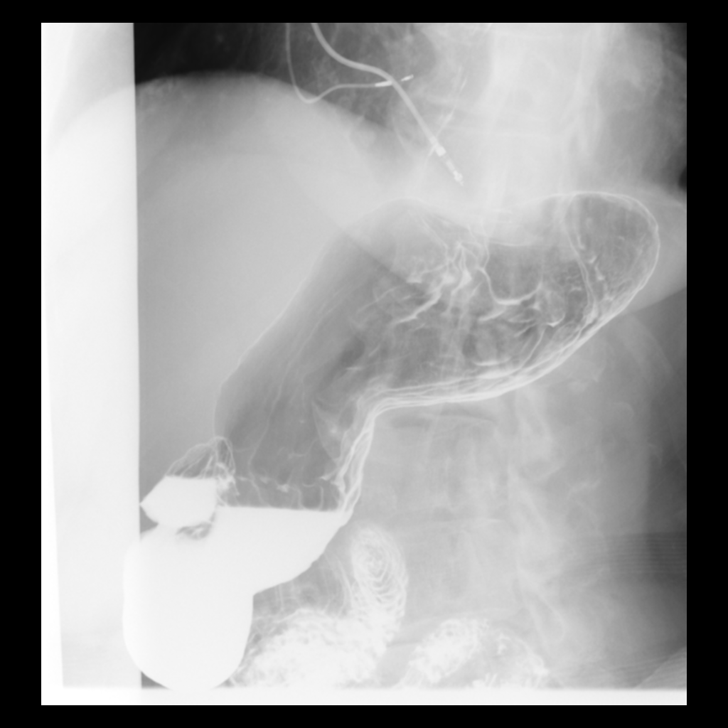

[Series 17: cp_standard · 0.39mm/px · 1 of 59 frames shown (4 of 7)]
[frame 51/59]
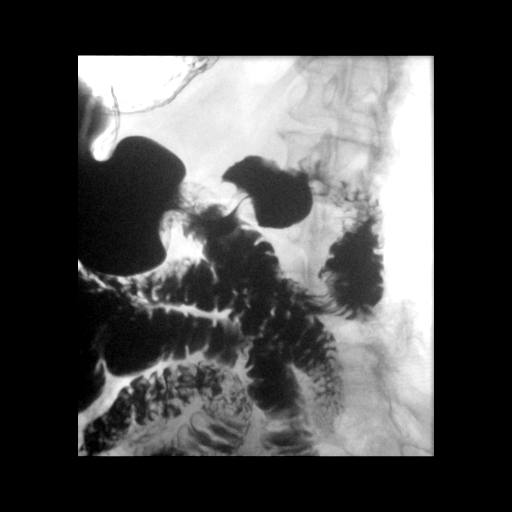

[Series 18: cp_standard · 0.39mm/px · 1 of 148 frames shown (5 of 7)]
[frame 126/148]
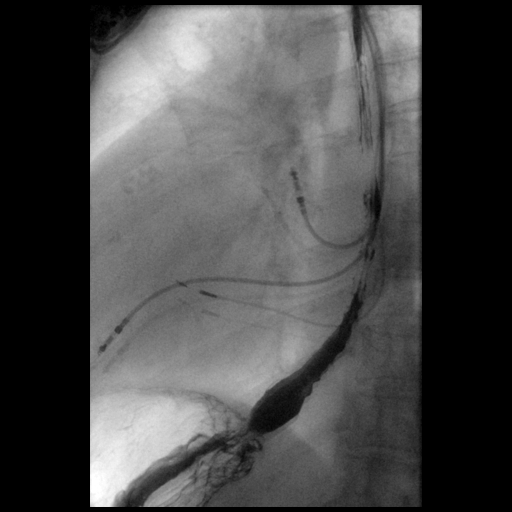

[Series 21: cp_standard · 0.37mm/px · 1 of 98 frames shown (6 of 7)]
[frame 15/98]
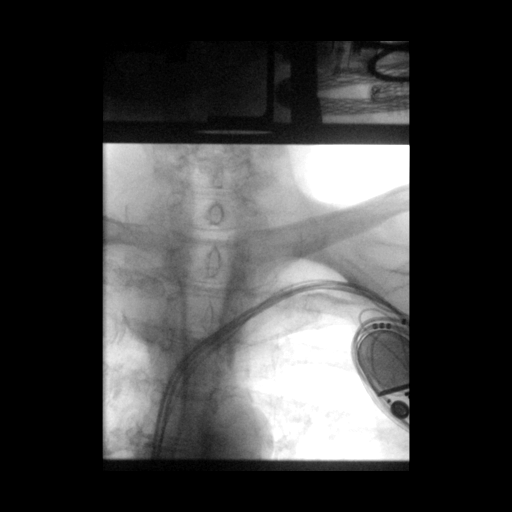

[Series 22: cp_standard · 0.19mm/px · 1 of 2 slices shown (7 of 7)]
[im 2/2]
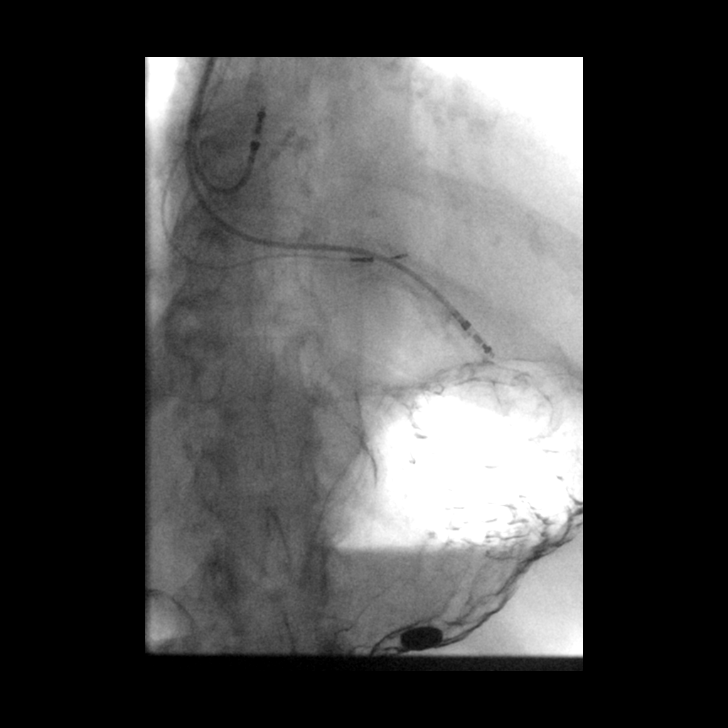

[13 of 24 positions shown; findings below may reference images not displayed]

FINDINGS: Swallowing: Appears normal.

Pharynx: Unremarkable.

Esophagus: In the high cervical esophagus, there is prominent smooth
indentation along the posterior wall of the esophagus consistent
with cricopharyngeal bar. Bar extends approximately 50% across the
lumen of the esophagus. Multiple additional levels of indentation
from osteophytes which are mild.

No mucosal irregularity in the thoracic esophagus or distal
esophagus. No stricture or mass. GE junction normal.

Esophageal motility: Poor primary contraction waves with
interruption in progression. Some tertiary contractions present

Hiatal Hernia: None

Gastroesophageal reflux: None elicited despite multiple provocative
maneuvers

Ingested 13mm barium tablet: Passed normally

Other: Unremarkable gastric mucosa, normal gastric emptying,
visualized portions of small bowel are unremarkable.
IMPRESSION: 1. Prominent cricopharyngeal bar in the high cervical esophagus.
2. No mucosal irregularity in esophagus. No stricture or mass. Mild
esophageal dysmotility.
3. Normal stomach.  Normal rugal pattern.
4. Normal duodenum

.

## 2024-04-18 NOTE — Telephone Encounter (Signed)
 Bridget [name]

## 2024-04-23 ENCOUNTER — Ambulatory Visit: Payer: Medicare Other

## 2024-04-23 ENCOUNTER — Ambulatory Visit: Payer: Medicare Other | Admitting: Family Medicine
